# Patient Record
Sex: Male | Born: 1970
Health system: Southern US, Community
[De-identification: ages and names within clinical notes are randomized; demographics above are authoritative.]

## PROBLEM LIST (undated history)

## (undated) DIAGNOSIS — M5416 Radiculopathy, lumbar region: Secondary | ICD-10-CM

## (undated) DIAGNOSIS — M545 Low back pain: Secondary | ICD-10-CM

## (undated) DIAGNOSIS — E1142 Type 2 diabetes mellitus with diabetic polyneuropathy: Secondary | ICD-10-CM

## (undated) DIAGNOSIS — I10 Essential (primary) hypertension: Secondary | ICD-10-CM

## (undated) DIAGNOSIS — G8929 Other chronic pain: Secondary | ICD-10-CM

## (undated) DIAGNOSIS — F32A Depression, unspecified: Secondary | ICD-10-CM

## (undated) DIAGNOSIS — E119 Type 2 diabetes mellitus without complications: Secondary | ICD-10-CM

## (undated) DIAGNOSIS — F1010 Alcohol abuse, uncomplicated: Secondary | ICD-10-CM

## (undated) DIAGNOSIS — K259 Gastric ulcer, unspecified as acute or chronic, without hemorrhage or perforation: Secondary | ICD-10-CM

## (undated) DIAGNOSIS — G629 Polyneuropathy, unspecified: Secondary | ICD-10-CM

## (undated) DIAGNOSIS — H47019 Ischemic optic neuropathy, unspecified eye: Secondary | ICD-10-CM

## (undated) DIAGNOSIS — F419 Anxiety disorder, unspecified: Secondary | ICD-10-CM

## (undated) DIAGNOSIS — G4730 Sleep apnea, unspecified: Secondary | ICD-10-CM

## (undated) HISTORY — DX: Radiculopathy, lumbar region: M54.16

## (undated) HISTORY — DX: Alcohol abuse, uncomplicated: F10.10

## (undated) HISTORY — DX: Type 2 diabetes mellitus with diabetic polyneuropathy: E11.42

## (undated) HISTORY — DX: Other chronic pain: G89.29

## (undated) HISTORY — DX: Polyneuropathy, unspecified: G62.9

## (undated) HISTORY — PX: CARDIAC CATHETERIZATION: SHX172

## (undated) HISTORY — DX: Sleep apnea, unspecified: G47.30

## (undated) HISTORY — DX: Low back pain: M54.5

## (undated) HISTORY — DX: Ischemic optic neuropathy, unspecified eye: H47.019

## (undated) HISTORY — DX: Type 2 diabetes mellitus without complications: E11.9

## (undated) HISTORY — PX: TOTAL KNEE ARTHROPLASTY: SHX125

## (undated) HISTORY — DX: Essential (primary) hypertension: I10

## (undated) HISTORY — DX: Gastric ulcer, unspecified as acute or chronic, without hemorrhage or perforation: K25.9

---

## 1996-11-26 HISTORY — PX: KNEE ARTHROSCOPY W/ ACL RECONSTRUCTION: SHX1858

## 1998-05-23 ENCOUNTER — Emergency Department (HOSPITAL_COMMUNITY): Admission: EM | Admit: 1998-05-23 | Discharge: 1998-05-23 | Payer: Self-pay | Admitting: Emergency Medicine

## 1999-03-19 ENCOUNTER — Emergency Department (HOSPITAL_COMMUNITY): Admission: EM | Admit: 1999-03-19 | Discharge: 1999-03-19 | Payer: Self-pay

## 1999-11-27 HISTORY — PX: MENISCUS REPAIR: SHX5179

## 2000-10-12 ENCOUNTER — Encounter: Payer: Self-pay | Admitting: Emergency Medicine

## 2000-10-12 ENCOUNTER — Emergency Department (HOSPITAL_COMMUNITY): Admission: EM | Admit: 2000-10-12 | Discharge: 2000-10-12 | Payer: Self-pay | Admitting: Emergency Medicine

## 2003-04-12 ENCOUNTER — Encounter (INDEPENDENT_AMBULATORY_CARE_PROVIDER_SITE_OTHER): Payer: Self-pay | Admitting: *Deleted

## 2003-04-12 ENCOUNTER — Ambulatory Visit (HOSPITAL_BASED_OUTPATIENT_CLINIC_OR_DEPARTMENT_OTHER): Admission: RE | Admit: 2003-04-12 | Discharge: 2003-04-12 | Payer: Self-pay | Admitting: *Deleted

## 2006-03-26 HISTORY — PX: OTHER SURGICAL HISTORY: SHX169

## 2007-08-18 ENCOUNTER — Ambulatory Visit: Payer: Self-pay | Admitting: Gastroenterology

## 2007-09-02 ENCOUNTER — Ambulatory Visit (HOSPITAL_COMMUNITY): Admission: RE | Admit: 2007-09-02 | Discharge: 2007-09-02 | Payer: Self-pay | Admitting: Gastroenterology

## 2007-09-16 ENCOUNTER — Ambulatory Visit: Payer: Self-pay | Admitting: Gastroenterology

## 2007-09-16 ENCOUNTER — Encounter: Payer: Self-pay | Admitting: Gastroenterology

## 2007-09-16 DIAGNOSIS — D130 Benign neoplasm of esophagus: Secondary | ICD-10-CM

## 2007-09-16 DIAGNOSIS — K2970 Gastritis, unspecified, without bleeding: Secondary | ICD-10-CM

## 2007-10-27 HISTORY — PX: UPPER GI ENDOSCOPY: SHX6162

## 2008-02-09 DIAGNOSIS — F411 Generalized anxiety disorder: Secondary | ICD-10-CM | POA: Insufficient documentation

## 2008-02-09 DIAGNOSIS — IMO0001 Reserved for inherently not codable concepts without codable children: Secondary | ICD-10-CM | POA: Insufficient documentation

## 2008-02-09 DIAGNOSIS — E1165 Type 2 diabetes mellitus with hyperglycemia: Secondary | ICD-10-CM

## 2008-02-09 DIAGNOSIS — Z794 Long term (current) use of insulin: Secondary | ICD-10-CM

## 2008-02-09 DIAGNOSIS — Z8639 Personal history of other endocrine, nutritional and metabolic disease: Secondary | ICD-10-CM

## 2008-02-09 DIAGNOSIS — Z862 Personal history of diseases of the blood and blood-forming organs and certain disorders involving the immune mechanism: Secondary | ICD-10-CM

## 2008-02-09 DIAGNOSIS — I10 Essential (primary) hypertension: Secondary | ICD-10-CM

## 2008-02-09 DIAGNOSIS — F1021 Alcohol dependence, in remission: Secondary | ICD-10-CM | POA: Insufficient documentation

## 2008-09-06 ENCOUNTER — Emergency Department (HOSPITAL_COMMUNITY): Admission: EM | Admit: 2008-09-06 | Discharge: 2008-09-06 | Payer: Self-pay | Admitting: Emergency Medicine

## 2008-10-26 HISTORY — PX: KNEE ARTHROSCOPY: SUR90

## 2010-03-08 ENCOUNTER — Encounter: Admission: RE | Admit: 2010-03-08 | Discharge: 2010-03-08 | Payer: Self-pay | Admitting: Orthopedic Surgery

## 2010-12-17 ENCOUNTER — Encounter: Payer: Self-pay | Admitting: Gastroenterology

## 2011-02-19 ENCOUNTER — Other Ambulatory Visit: Payer: Self-pay | Admitting: Surgery

## 2011-04-10 NOTE — Letter (Signed)
August 18, 2007    Grant [name]   RE:  Grant [name], Grant [name]  MRN:  [id number]  /  DOB:  24-May-1971   Dear Grant [name]:   It is my pleasure to have treated you recently as a new patient in my  office.  I appreciate your confidence and the opportunity to participate  in your care.   Since I do have a busy inpatient endoscopy schedule and office schedule,  my office hours vary weekly.  I am, however, available for emergency  calls every day through my office.  If I cannot promptly meet an urgent  office appointment, another one of our gastroenterologists will be able  to assist you.   My well-trained staff are prepared to help you at all times.  For  emergencies after office hours, a physician from our gastroenterology  section is always available through my 24-hour answering service.   While you are under my care, I encourage discussion of your questions  and concerns, and I will be happy to return your calls as soon as I am  available.   Once again, I welcome you as a new patient and I look forward to a happy  and healthy relationship.    Sincerely,      Barbette Hair. Arlyce Dice, MD,FACG  Electronically Signed   RDK/MedQ  DD: 08/18/2007  DT: 08/19/2007  Job #: [id number]

## 2011-04-10 NOTE — Letter (Signed)
August 18, 2007    Barry Dienes. Eloise Harman, M.D.  [address]  [place], [place] [address]   RE:  [name], [name]  MRN:  [id number]  /  DOB:  08/31/1971   Dear Dr. Eloise Harman:   Upon your kind referral, I had the pleasure of evaluating your patient  and I am pleased to offer my findings.  I saw Grant [name] in the office  today.  Enclosed is a copy of my progress note that details my findings  and recommendations.   Thank you for the opportunity to participate in your patient's care.    Sincerely,      Barbette Hair. Arlyce Dice, MD,FACG  Electronically Signed    RDK/MedQ  DD: 08/18/2007  DT: 08/19/2007  Job #: [id number]

## 2011-04-10 NOTE — Assessment & Plan Note (Signed)
[facility]                         GASTROENTEROLOGY OFFICE NOTE   NAME:Grant [name], Grant [name]                       MRN:          [id number]  DATE:08/18/2007                            DOB:          [birth year]    REASON FOR CONSULTATION:  1. Anorexia.  2. Weight loss.   REASON:  Grant [name] is a 40 year old white male referred through the  courtesy of Dr. Eloise Harman for evaluation. He complains of immediate post  prandial fullness characterized by early satiety. He feels as if food  will stop in his lower chest. Symptoms are clearly worse post  prandially. On an empty stomach he has aching upper abdominal  discomfort. He denies pyrosis, per se. With the full feeling he  frequently has panic attacks. He has got to spit out food. He also has 3  to 4 loose bowel movements a day. He has lost 35 pounds in the past 2  months. He has a history of anxiety. He was intolerant of Effexor. Lab  work was pertinent for normal amylase and lipase. Liver test demonstrate  an AST of 103, an ALT of 99, alkaline phosphatase, and bilirubin were  normal. Grant [name] has a history of intermittent heavy alcohol use. He  still drinks on occasion. He has taken Nexium in the past without any  improvement in his symptoms.   PAST MEDICAL HISTORY:  Pertinent for hypertension and diabetes. He has a  panic disorder.   FAMILY HISTORY:  Noncontributory.   MEDICATIONS:  Include; labetalol, aspirin, Januvia, Colace, Benicar, and  Neurontin.   HE IS ALLERGIC TO TYLOX.   He does not smoke. 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   REVIEW OF SYSTEMS:  Reviewed and was negative.   PHYSICAL EXAMINATION:  He is an anxious appearing male, pulse 76, blood  pressure 180/110, weight 251.  HEENT: EOMI.  PERRLA.  Sclerae are anicteric.  Conjunctivae are pink.  NECK:  Supple without thyromegaly, adenopathy or carotid bruits.  CHEST:  Clear to auscultation and percussion without adventitious  sounds.  CARDIAC:  Regular rhythm; normal S1 S2.  There are no murmurs, gallops  or rubs.  ABDOMEN:  Bowel sounds are normoactive.  Abdomen is soft, nontender and  nondistended.  There are no abdominal masses, tenderness, splenic  enlargement or hepatomegaly.  EXTREMITIES:  Full range of motion.  No cyanosis, clubbing or edema.  RECTAL:  Deferred. There is no succussion  splash.   IMPRESSION:  1. Early satiety with possible dysphagia. Symptoms could be due to a      motility disorder, anxiety, distal esophageal stricture, or      gastroparesis.  2. Abnormal liver test. I suspect he has hepatic steatosis.  3. Severe anxiety.  4. Diabetes.  5. Hypertension.   RECOMMENDATION:  1. Trial of Lexapro 10 mg a day.  2. Abdominal ultrasound.  3. Upper endoscopy with dilation as indicated.     Grant [name]. Arlyce Dice, MD,FACG  Electronically Signed    RDK/MedQ  DD: 08/18/2007  DT: 08/19/2007  Job #: [id number]   cc:   Barry Dienes. Eloise Harman, M.D.

## 2011-04-13 NOTE — Op Note (Signed)
   NAME:  Grant [name], Grant [name]                          ACCOUNT NO.:  [id number]   MEDICAL RECORD NO.:  [id number]                   PATIENT TYPE:  AMB   LOCATION:  DSC                                  FACILITY:  [facility]   PHYSICIAN:  Maisie Fus B. Samuella Cota, M.D.               DATE OF BIRTH:  12/15/1970   DATE OF PROCEDURE:  04/12/2003  DATE OF DISCHARGE:                                 OPERATIVE REPORT   [facility] #[id number].   PREOPERATIVE DIAGNOSIS:  Lipoma, left upper back.   POSTOPERATIVE DIAGNOSIS:  Lipoma, left upper back.   PROCEDURE:  Excision of lipoma, left upper back.   DESCRIPTION OF PROCEDURE:  The patient was taken to the minor surgery room  at [facility] day surgery.  The patient had a subcutaneous mass consistent with a  lipoma of the left upper back.  This was 3 x 3 cm.  The area was marked with  a marking pen and the area prepped with Betadine and sterile drapes applied.  Xylocaine 1% local was used to anesthetize the skin over the mass.  The  incision was made, which was 3 cm long.  Dissection was taken down to the  fascia covering the lipoma.  This was incised and the 3 cm in diameter  lipoma was shelled out.  The fascia was then closed with interrupted sutures  of 3-0 Vicryl, the skin was closed with five vertical mattress sutures of 4-  0 nylon.  A dry sterile dressing was applied.  The patient seemed to  tolerate the procedure well, was taken to the waiting room and discharged in  satisfactory condition.                                               Thomas B. Samuella Cota, M.D.    TBP/MEDQ  D:  04/12/2003  T:  04/12/2003  Job:  [id number]   cc:   Barry Dienes. Eloise Harman, M.D.  [address]  [place]  [place] [address]  Fax: [phone]

## 2011-08-28 LAB — DIFFERENTIAL
Lymphocytes Relative: 19
Lymphs Abs: 1.6
Monocytes Absolute: 0.5
Monocytes Relative: 6
Neutro Abs: 6
Neutrophils Relative %: 73

## 2011-08-28 LAB — COMPREHENSIVE METABOLIC PANEL
Albumin: 4.3
BUN: 7
Calcium: 9.3
Glucose, Bld: 200 — ABNORMAL HIGH
Potassium: 4
Total Protein: 7

## 2011-08-28 LAB — RAPID URINE DRUG SCREEN, HOSP PERFORMED
Benzodiazepines: POSITIVE — AB
Tetrahydrocannabinol: NOT DETECTED

## 2011-08-28 LAB — CBC
HCT: 54.2 — ABNORMAL HIGH
Hemoglobin: 18.5 — ABNORMAL HIGH
MCHC: 34.1
Platelets: 132 — ABNORMAL LOW
RDW: 13.7

## 2011-08-28 LAB — URINALYSIS, ROUTINE W REFLEX MICROSCOPIC
Glucose, UA: >1000 — AB
Hgb urine dipstick: NEGATIVE
Specific Gravity, Urine: 1.035 — ABNORMAL HIGH
Urobilinogen, UA: 1
pH: 6

## 2011-08-28 LAB — URINE MICROSCOPIC-ADD ON

## 2011-08-28 LAB — ETHANOL: Alcohol, Ethyl (B): <5

## 2013-08-05 ENCOUNTER — Encounter: Payer: Self-pay | Admitting: Internal Medicine

## 2013-08-05 ENCOUNTER — Encounter (HOSPITAL_COMMUNITY): Payer: Self-pay | Admitting: Pharmacy Technician

## 2013-08-05 ENCOUNTER — Ambulatory Visit (INDEPENDENT_AMBULATORY_CARE_PROVIDER_SITE_OTHER): Payer: BC Managed Care – PPO | Admitting: Internal Medicine

## 2013-08-05 ENCOUNTER — Other Ambulatory Visit: Payer: Self-pay | Admitting: *Deleted

## 2013-08-05 VITALS — BP 140/110 | HR 104 | Ht 71.0 in | Wt 222.9 lb

## 2013-08-05 DIAGNOSIS — I10 Essential (primary) hypertension: Secondary | ICD-10-CM

## 2013-08-05 DIAGNOSIS — R5381 Other malaise: Secondary | ICD-10-CM

## 2013-08-05 DIAGNOSIS — R000 Tachycardia, unspecified: Secondary | ICD-10-CM

## 2013-08-05 DIAGNOSIS — D689 Coagulation defect, unspecified: Secondary | ICD-10-CM

## 2013-08-05 DIAGNOSIS — G629 Polyneuropathy, unspecified: Secondary | ICD-10-CM

## 2013-08-05 DIAGNOSIS — R079 Chest pain, unspecified: Secondary | ICD-10-CM

## 2013-08-05 DIAGNOSIS — G609 Hereditary and idiopathic neuropathy, unspecified: Secondary | ICD-10-CM

## 2013-08-05 DIAGNOSIS — R0609 Other forms of dyspnea: Secondary | ICD-10-CM

## 2013-08-05 DIAGNOSIS — R5383 Other fatigue: Secondary | ICD-10-CM

## 2013-08-05 DIAGNOSIS — E1065 Type 1 diabetes mellitus with hyperglycemia: Secondary | ICD-10-CM

## 2013-08-05 DIAGNOSIS — Z862 Personal history of diseases of the blood and blood-forming organs and certain disorders involving the immune mechanism: Secondary | ICD-10-CM

## 2013-08-05 DIAGNOSIS — Z01818 Encounter for other preprocedural examination: Secondary | ICD-10-CM

## 2013-08-05 MED ORDER — NITROGLYCERIN 0.4 MG SL SUBL: 0.4000 mg | SUBLINGUAL_TABLET | SUBLINGUAL | Status: DC | PRN

## 2013-08-05 NOTE — Progress Notes (Signed)
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 He recently saw Dr. Jarold Motto with complaints of increasing shortness of breath with minimal exertion and fatigue as well as chest heaviness which radiates around the left side. The symptoms are somewhat worse with exertion and relieved at rest. He notes the increasing fatigue and shortness of breath over the past couple of weeks this has also been associated with marked hypertension, both systolic and diastolic. He was recently placed on Benicar in addition to an increase in his labetalol to 600 mg twice daily. Despite that he continues to be hypertensive with a blood pressure of 140/110 today and tachycardic at 104.  In addition to hypertension he also has insulin-dependent diabetes which has been poorly controlled, dyslipidemia not on medication, family history of hypertension and diabetes, and continues to smoke. He has also struggled with alcohol dependence, and recently quit cold [place] about 2 months ago, but previously had significant alcohol abuse and dependence issues.  He apparently had recent blood work which showed a very high hematocrit, and was told to donate blood? He is referred for evaluation of coronary artery disease.  PMHx:  Past Medical History  Diagnosis Date  . Type 2 diabetes mellitus   . Neuropathy     Past Surgical History  Procedure Laterality Date  . Back lipoma resection  03/2006  . Knee arthroscopy  10/2008    left  . Upper gi endoscopy  10/2007    showed gastritis    FAMHx:  Family History  Problem Relation Age of Onset  . Diabetes Mother   . Hypertension Mother   . Hypertension Father   . Hyperlipidemia Father   . Brain cancer      grandparent  . Stomach cancer      grandparent     SOCHx:   reports that he has been smoking.  His smokeless tobacco use includes Chew. He reports that he does not drink alcohol or use illicit drugs.  ALLERGIES:  Allergies  Allergen Reactions  . Tylox [Oxycodone-Acetaminophen] Nausea And Vomiting    ROS: A comprehensive review of systems was negative except for: Constitutional: positive for fatigue Respiratory: positive for dyspnea on exertion Cardiovascular: positive for exertional chest pressure/discomfort  HOME MEDS: Current Outpatient Prescriptions  Medication Sig Dispense Refill  . aspirin 325 MG EC tablet Take 325 mg by mouth daily.      Marland Kitchen glimepiride (AMARYL) 2 MG tablet Take 2 mg by mouth daily before breakfast.      . insulin glargine (LANTUS) 100 UNIT/ML injection Inject 20 Units into the skin at bedtime.      Marland Kitchen labetalol (NORMODYNE) 200 MG tablet Take 600 mg by mouth 2 (two) times daily.      Marland Kitchen olmesartan (BENICAR) 20 MG tablet Take 20 mg by mouth daily.      . traZODone (DESYREL) 100 MG tablet Take 100 mg by mouth at bedtime.      . nitroGLYCERIN (NITROSTAT) 0.4 MG SL tablet Place 1 tablet (0.4 mg total) under the tongue every 5 (five) minutes as needed for chest pain.  25 tablet  3   No current facility-administered medications for this visit.    LABS/IMAGING: No results found for this or any previous visit (from the past 48 hour(s)). No  results found.  VITALS: BP 140/110  Pulse 104  Ht 5\' 11"  (1.803 m)  Wt 222 lb 14.4 oz (101.107 kg)  BMI 31.1 kg/m2  EXAM: General appearance: alert and no distress Neck: no adenopathy, no carotid bruit, no JVD, supple, symmetrical, trachea midline and thyroid not enlarged, symmetric, no tenderness/mass/nodules Lungs: clear to auscultation bilaterally Heart: Tachycardic, regular rhythm Abdomen: soft, non-tender; bowel sounds normal; no masses,  no organomegaly Extremities: extremities normal, atraumatic, no cyanosis or edema Pulses: 2+ and symmetric Skin: Pale,  diaphoretic Neurologic: Grossly normal  EKG: Sinus tachycardia at 104, no rest ischemic changes  ASSESSMENT: 1. Progressive chest discomfort, fatigue and dyspnea on exertion, highly concerning for coronary disease 2. Systolic and diastolic hypertension-uncontrolled 3. Insulin-dependent diabetes-uncontrolled 4. Alcohol dependence, currently sober 5. Tobacco dependence 6. Dyslipidemia  PLAN: 1.   Mellody Dance has been increasingly symptomatic over the past 2 weeks with progressive shortness of breath on exertion as well as left sided chest pressure. His blood pressure has been markedly elevated despite increases in his blood pressure medicine is still not at goal. He has both systolic and diastolic hypertension, and his heart rate is still elevated in the face of high-dose labetalol. The best explanation for this would be significant coronary ischemia. Diastolic hypertension and tachycardia are clear signs that the heart is not getting good blood flow. It would be unlikely for this to be related to alcohol withdrawal almost 2 months after stopping his routine alcohol use. She has numerous, uncontrolled coronary risk factors and a pretty good story for angina, actually concerning for high-grade LAD, proximal left main or multivessel disease. Based on the high pretest probability for ischemic coronary disease, I have recommended going directly to cardiac catheterization. I discussed risks and benefits of the procedure with him today and we'll hopefully be able to perform it through a radial approach. I encouraged him to continue to take aspirin daily and will provide a note limiting the strenuous status of his activities. He was reminded that if he had recurrent chest pain or shortness of breath that did not improve before the catheterization that he should present to the emergency room. I also prescribed him some sublingual nitroglycerin and instructed him how to use it and who to call if he should have a need  for it.  Hopefully we'll be able to perform his catheterization next week.  Thank you for referring Mr. Kincheloe. If I can be of further assistance for him or other patient please don't hesitate to contact me.  Chrystie Nose, MD, [facility] Attending Cardiologist [facility]  Mushka Laconte C 08/05/2013, 10:58 AM

## 2013-08-05 NOTE — Patient Instructions (Addendum)
Dr. Rennis Golden has ordered nitroglycerin to be used as needed for chest pain. You can take 1 pill (place under your tongue) every 5 minutes for a maximum of 3 doses.   Dr. Rennis Golden has ordered a heart catheterization. Please schedule for next week with Dr. Georgeann Oppenheim will need lab work done prior to this procedure, as well as a chest x-ray. The best location to go to for this pre-procedure tests/blood work is [address]) near [facility]. Both the chest x-ray and blood work are walk-in - no appointment is needed.

## 2013-08-06 ENCOUNTER — Ambulatory Visit
Admission: RE | Admit: 2013-08-06 | Discharge: 2013-08-06 | Disposition: A | Discharge status: Home / Self Care | Payer: BC Managed Care – PPO | Source: Ambulatory Visit | Attending: Internal Medicine | Admitting: Internal Medicine

## 2013-08-06 LAB — BASIC METABOLIC PANEL
CO2: 27 mEq/L (ref 19–32)
Chloride: 98 mEq/L (ref 96–112)
Creat: 0.76 mg/dL (ref 0.50–1.35)
Potassium: 4.4 mEq/L (ref 3.5–5.3)
Sodium: 136 mEq/L (ref 135–145)

## 2013-08-06 LAB — LIPID PANEL
Cholesterol: 209 mg/dL — ABNORMAL HIGH (ref 0–200)
HDL: 39 mg/dL — ABNORMAL LOW (ref >39)
Triglycerides: 347 mg/dL — ABNORMAL HIGH (ref <150)

## 2013-08-06 LAB — CBC
MCV: 88.7 fL (ref 78.0–100.0)
Platelets: 207 10*3/uL (ref 150–400)
RBC: 5.41 MIL/uL (ref 4.22–5.81)
WBC: 8.7 10*3/uL (ref 4.0–10.5)

## 2013-08-06 LAB — TSH: TSH: 2.369 u[IU]/mL (ref 0.350–4.500)

## 2013-08-06 LAB — APTT: aPTT: 37 seconds (ref 24–37)

## 2013-08-11 ENCOUNTER — Ambulatory Visit (HOSPITAL_COMMUNITY)
Admission: RE | Admit: 2013-08-11 | Discharge: 2013-08-11 | Disposition: A | Discharge status: Home / Self Care | Payer: BC Managed Care – PPO | Source: Ambulatory Visit | Attending: Internal Medicine | Admitting: Internal Medicine

## 2013-08-11 ENCOUNTER — Encounter (HOSPITAL_COMMUNITY)
Admission: RE | Disposition: A | Discharge status: Home / Self Care | Payer: Self-pay | Source: Ambulatory Visit | Attending: Internal Medicine

## 2013-08-11 DIAGNOSIS — Z01818 Encounter for other preprocedural examination: Secondary | ICD-10-CM

## 2013-08-11 DIAGNOSIS — R0609 Other forms of dyspnea: Secondary | ICD-10-CM

## 2013-08-11 DIAGNOSIS — I209 Angina pectoris, unspecified: Secondary | ICD-10-CM | POA: Insufficient documentation

## 2013-08-11 DIAGNOSIS — R000 Tachycardia, unspecified: Secondary | ICD-10-CM

## 2013-08-11 DIAGNOSIS — E119 Type 2 diabetes mellitus without complications: Secondary | ICD-10-CM | POA: Insufficient documentation

## 2013-08-11 DIAGNOSIS — F17200 Nicotine dependence, unspecified, uncomplicated: Secondary | ICD-10-CM | POA: Insufficient documentation

## 2013-08-11 DIAGNOSIS — Z794 Long term (current) use of insulin: Secondary | ICD-10-CM | POA: Insufficient documentation

## 2013-08-11 DIAGNOSIS — R5383 Other fatigue: Secondary | ICD-10-CM

## 2013-08-11 DIAGNOSIS — I10 Essential (primary) hypertension: Secondary | ICD-10-CM

## 2013-08-11 DIAGNOSIS — R079 Chest pain, unspecified: Secondary | ICD-10-CM

## 2013-08-11 DIAGNOSIS — IMO0001 Reserved for inherently not codable concepts without codable children: Secondary | ICD-10-CM

## 2013-08-11 DIAGNOSIS — I2510 Atherosclerotic heart disease of native coronary artery without angina pectoris: Secondary | ICD-10-CM

## 2013-08-11 DIAGNOSIS — E785 Hyperlipidemia, unspecified: Secondary | ICD-10-CM | POA: Insufficient documentation

## 2013-08-11 HISTORY — PX: LEFT HEART CATHETERIZATION WITH CORONARY ANGIOGRAM: SHX5451

## 2013-08-11 LAB — GLUCOSE, CAPILLARY

## 2013-08-11 SURGERY — LEFT HEART CATHETERIZATION WITH CORONARY ANGIOGRAM
Anesthesia: LOCAL

## 2013-08-11 MED ORDER — ATORVASTATIN CALCIUM 20 MG PO TABS: 20.0000 mg | ORAL_TABLET | Freq: Every day | ORAL | Status: DC

## 2013-08-11 MED ORDER — FENTANYL CITRATE 0.05 MG/ML IJ SOLN
INTRAMUSCULAR | Status: AC
  Filled 2013-08-11: qty 2

## 2013-08-11 MED ORDER — SODIUM CHLORIDE 0.9 % IV SOLN: INTRAVENOUS | Status: DC

## 2013-08-11 MED ORDER — SODIUM CHLORIDE 0.9 % IV SOLN
INTRAVENOUS | Status: DC
  Administered 2013-08-11: 10:00:00 via INTRAVENOUS

## 2013-08-11 MED ORDER — ACETAMINOPHEN 325 MG PO TABS: 650.0000 mg | ORAL_TABLET | ORAL | Status: DC | PRN

## 2013-08-11 MED ORDER — ASPIRIN 81 MG PO CHEW
324.0000 mg | CHEWABLE_TABLET | ORAL | Status: AC
  Administered 2013-08-11: 324 mg via ORAL
  Filled 2013-08-11: qty 4

## 2013-08-11 MED ORDER — ONDANSETRON HCL 4 MG/2ML IJ SOLN: 4.0000 mg | Freq: Four times a day (QID) | INTRAMUSCULAR | Status: DC | PRN

## 2013-08-11 MED ORDER — ASPIRIN EC 81 MG PO TBEC: 81.0000 mg | DELAYED_RELEASE_TABLET | Freq: Every day | ORAL | Status: DC

## 2013-08-11 MED ORDER — NITROGLYCERIN 0.2 MG/ML ON CALL CATH LAB
INTRAVENOUS | Status: AC
  Filled 2013-08-11: qty 1

## 2013-08-11 MED ORDER — ISOSORBIDE MONONITRATE ER 30 MG PO TB24: 15.0000 mg | ORAL_TABLET | Freq: Every day | ORAL | Status: DC

## 2013-08-11 MED ORDER — LIDOCAINE HCL (PF) 1 % IJ SOLN
INTRAMUSCULAR | Status: AC
  Filled 2013-08-11: qty 30

## 2013-08-11 MED ORDER — AMLODIPINE BESYLATE 5 MG PO TABS: 5.0000 mg | ORAL_TABLET | Freq: Every day | ORAL | Status: DC

## 2013-08-11 MED ORDER — SODIUM CHLORIDE 0.9 % IJ SOLN: 3.0000 mL | INTRAMUSCULAR | Status: DC | PRN

## 2013-08-11 MED ORDER — MIDAZOLAM HCL 5 MG/5ML IJ SOLN
INTRAMUSCULAR | Status: AC
  Filled 2013-08-11: qty 5

## 2013-08-11 MED ORDER — HEPARIN (PORCINE) IN NACL 2-0.9 UNIT/ML-% IJ SOLN
INTRAMUSCULAR | Status: AC
  Filled 2013-08-11: qty 1000

## 2013-08-11 NOTE — H&P (Signed)
    INTERVAL PROCEDURE H&P  History and Physical Interval Note:  08/11/2013 8:38 AM  Grant [name] has presented today for their planned procedure. The various methods of treatment have been discussed with the patient and family. After consideration of risks, benefits and other options for treatment, the patient has consented to the procedure.  The patients' outpatient history has been reviewed, patient examined, and no change in status from most recent office note within the past 30 days. I have reviewed the patients' chart and labs and will proceed as planned. Questions were answered to the patient's satisfaction.   Chrystie Nose, MD, [facility] Attending Cardiologist [facility]  [name],[name] 08/11/2013, 8:38 AM

## 2013-08-11 NOTE — CV Procedure (Signed)
CARDIAC CATHETERIZATION REPORT  Grant [name]   [id number] 1971/04/23  Performing Cardiologist: Chrystie Nose Primary Physician: Garlan Fillers, MD Primary Cardiologist:  Dr. Rennis Golden  Procedures Performed:  Left Heart Catheterization via 5 Fr right femoral artery access  Left Ventriculography, (RAO/LAO) 30 ml/sec for 15 ml total contrast  Native Coronary Angiography  Indication(s): DOE, unstable angina  Pre-Procedural Diagnosis(es):  1. Unstable angina  Post-Procedural Diagnosis(es): 1. 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 He recently saw Dr. Jarold Motto with complaints of increasing shortness of breath with minimal exertion and fatigue as well as chest heaviness which radiates around the left side. The symptoms are somewhat worse with exertion and relieved at rest. He notes the increasing fatigue and shortness of breath over the past couple of weeks this has also been associated with marked hypertension, both systolic and diastolic. He was recently placed on Benicar in addition to an increase in his labetalol to 600 mg twice daily. Despite that he continues to be hypertensive with a blood pressure of 140/110 today and tachycardic at 104. In addition to hypertension he also has insulin-dependent diabetes which has been poorly controlled, dyslipidemia not on medication, family history of hypertension and diabetes, and continues to smoke. He has also struggled with alcohol dependence, and recently quit cold [place] about 2 months ago, but previously had significant alcohol abuse and dependence issues. He apparently had recent blood work which showed a very high hematocrit, and was told to donate blood? He is referred for left heart catheterization.    Risks / Complications include, but not limited to: Death, MI, CVA/TIA, VF/VT (with defibrillation), Bradycardia (need for  temporary pacer placement), contrast induced nephropathy, bleeding / bruising / hematoma / pseudoaneurysm, vascular or coronary injury (with possible emergent CT or Vascular Surgery), adverse medication reactions, infection.    Consent: Risks of procedure as well as the alternatives and risks of each were explained to the (patient/caregiver).  Consent for procedure obtained.  Procedure: The patient was brought to the 2nd Floor [facility] in the fasting state and prepped and draped in the usual sterile fashion for (Right groin) access. A modified Allen's test with plethysmography was performed on the right wrist demonstrating inadequate Ulnar Artery collateral flow.  The right radial pulse was barely palpable.  Time Out: Verified patient identification, verified procedure, site/side was marked, verified correct patient position, special equipment/implants available, radiation safety measures in place (including badges and shielding), medications/allergies/relevent history reviewed, required imaging and test results available.  Performed  Procedure: The right femoral head was identified using tactile and fluoroscopic technique.  The right groin was anesthetized with 1% subcutaneous Lidocaine.  The right Common Femoral Artery was accessed using the Modified Seldinger Technique with placement of (5 Fr) sheath using the Seldinger technique.  The sheath was aspirated and flushed.  A 5 Fr JL4 Catheter was advanced of over a Standard J wire into the ascending Aorta.  The catheter was used to engage the left coronary artery, however, it sub-selected the LCX. Ultimately a 45F EBU guide was used to visualize the LAD.  Multiple cineangiographic views of the left coronary artery system(s) were performed. A 5 Fr JR4 Catheter was advanced of over a Safety J wire into the ascending Aorta.  The catheter was used to engage the right coronary artery.  Multiple cineangiographic views of the right  coronary artery system(s) were  performed. This catheter was then exchanged over the Standard J wire for an angled Pigtail catheter that was advanced across the Aortic Valve.  LV hemodynamics were measured (and Left Ventriculography was performed).  LV hemodynamics were then re-sampled, and the catheter was pulled back across the Aortic Valve for measurement of "pull-back" gradient.  The catheter and the wire was removed completely out of the body. The patient was transferred to the holding area where the sheath was removed with manual pressure held for hemostasis.   Recovery: The patient was transported to the cath lab holding area in stable condition.   The patient  was stable before, during and following the procedure.   Patient did tolerate procedure well. There were not complications.  EBL: Minimal  Medications:  Premedication: none  Sedation:  4 mg IV Versed, 100 mcg IV Fentanyl  Contrast:  75 ml Omnipaque  Local Anesthesia: 3 cc 1% lidocaine  100 mcg of IC nitroglycerin  Hemodynamics:  Central Aortic Pressure / Mean Aortic Pressure: 107/67  LV Pressure / LV End diastolic Pressure:  20  Left Ventriculography:  EF:  60-65%  Wall Motion: Normal  MR: none  Coronary Angiographic Data:  Left Main:  Short vessel, no stenosis. Essentially bifurcates almost immediately into the LAD and LCx systems.  Left Anterior Descending (LAD):  Long vessel which wraps around the apex. There was moderate to severe diffuse proximal stenosis initially which completely resolved with 100 mcg of IC nitroglycerin. There is a large septal perforator which is patent. The LAD gives rise to a diagonal branch in the mid-vessel.  1st diagonal (D1):  Angiographically free of disease.    Circumflex (LCx):  Dominant vessel. Gives off 3 OM vessels - one large vessel with a high takeoff and 2 moderate distal vessels. There is no stenosis.  1st obtuse marginal:  There was a 60% ostial OM1 stenosis, which was seen  completely resolving after IC nitroglycerin.  2nd obtuse marginal:  Distal, free of stenosis. 3rd obtuse marginal:  Distal, free of stenosis.     Right Coronary Artery: Smaller vessel, non-dominant. No stenosis or spasm was noted.  Impression: 1.  Coronary spasm of the LAD and LCx vessels. 2.  No angiographic coronary stenosis. 3.  LVEDP = 20 mmHg  Plan: 1.  Long discussion with the patient and family. Strongly urged smoking cessation for him and his wife. This will be the most important thing he can do to reduce spasm.  Although      his bp control is better, I wish to adjust his medications some to prevent spasm.  Will d/c benicar and add amlodipine 5 mg daily and imdur 15 mg daily. Continue labetalol. 2.  Marked dyslipidemia in a diabetic patient, with mostly elevated triglycerides. Recommend more aggressive diet and glycemic control. Will add lipitor 20 mg QHS. 3.  Reduce aspirin to prophylactic 81 mg daily (indication for diabetic). 4.  Plan to keep out of work until he is medically cleared after seeing me in 1 week for follow-up.  The case and results was discussed with the patient and family if available.  The case and results was not discussed with the patient's PCP. The case and results was discussed with the patient's Cardiologist.  Time Spent Directly with the Patient:  60 minutes  Chrystie Nose, MD, [facility] Attending Cardiologist [facility]  Everlene Cunning C 08/11/2013, 12:27 PM

## 2013-08-17 ENCOUNTER — Encounter: Payer: Self-pay | Admitting: Internal Medicine

## 2013-08-17 ENCOUNTER — Ambulatory Visit (INDEPENDENT_AMBULATORY_CARE_PROVIDER_SITE_OTHER): Payer: BC Managed Care – PPO | Admitting: Internal Medicine

## 2013-08-17 ENCOUNTER — Telehealth: Payer: Self-pay | Admitting: Hematology and Oncology

## 2013-08-17 VITALS — BP 138/92 | HR 88 | Ht 71.0 in | Wt 223.9 lb

## 2013-08-17 DIAGNOSIS — I201 Angina pectoris with documented spasm: Secondary | ICD-10-CM | POA: Insufficient documentation

## 2013-08-17 DIAGNOSIS — E785 Hyperlipidemia, unspecified: Secondary | ICD-10-CM

## 2013-08-17 DIAGNOSIS — R000 Tachycardia, unspecified: Secondary | ICD-10-CM

## 2013-08-17 DIAGNOSIS — I209 Angina pectoris, unspecified: Secondary | ICD-10-CM

## 2013-08-17 DIAGNOSIS — I10 Essential (primary) hypertension: Secondary | ICD-10-CM

## 2013-08-17 DIAGNOSIS — F17200 Nicotine dependence, unspecified, uncomplicated: Secondary | ICD-10-CM

## 2013-08-17 MED ORDER — NICOTINE POLACRILEX 4 MG MT GUM: 4.0000 mg | CHEWING_GUM | OROMUCOSAL | Status: DC | PRN

## 2013-08-17 NOTE — Telephone Encounter (Signed)
LVOM FOR PT TO RETURN CALL IN RE TO REFERRAL.  °

## 2013-08-17 NOTE — Patient Instructions (Addendum)
Your physician wants you to follow-up in: 6 months. You will receive a reminder letter in the mail two months in advance. If you don't receive a letter, please call our office to schedule the follow-up appointment.   Smoking Cessation Quitting smoking is important to your health and has many advantages. However, it is not always easy to quit since nicotine is a very addictive drug. Often times, people try 3 times or more before being able to quit. This document explains the best ways for you to prepare to quit smoking. Quitting takes hard work and a lot of effort, but you can do it. ADVANTAGES OF QUITTING SMOKING  You will live longer, feel better, and live better.  Your body will feel the impact of quitting smoking almost immediately.  Within 20 minutes, blood pressure decreases. Your pulse returns to its normal level.  After 8 hours, carbon monoxide levels in the blood return to normal. Your oxygen level increases.  After 24 hours, the chance of having a heart attack starts to decrease. Your breath, hair, and body stop smelling like smoke.  After 48 hours, damaged nerve endings begin to recover. Your sense of taste and smell improve.  After 72 hours, the body is virtually free of nicotine. Your bronchial tubes relax and breathing becomes easier.  After 2 to 12 weeks, lungs can hold more air. Exercise becomes easier and circulation improves.  The risk of having a heart attack, stroke, cancer, or lung disease is greatly reduced.  After 1 year, the risk of coronary heart disease is cut in half.  After 5 years, the risk of stroke falls to the same as a nonsmoker.  After 10 years, the risk of lung cancer is cut in half and the risk of other cancers decreases significantly.  After 15 years, the risk of coronary heart disease drops, usually to the level of a nonsmoker.  If you are pregnant, quitting smoking will improve your chances of having a healthy baby.  The people you live with,  especially any children, will be healthier.  You will have extra money to spend on things other than cigarettes. QUESTIONS TO THINK ABOUT BEFORE ATTEMPTING TO QUIT You may want to talk about your answers with your caregiver.  Why do you want to quit?  If you tried to quit in the past, what helped and what did not?  What will be the most difficult situations for you after you quit? How will you plan to handle them?  Who can help you through the tough times? Your family? Friends? A caregiver?  What pleasures do you get from smoking? What ways can you still get pleasure if you quit? Here are some questions to ask your caregiver:  How can you help me to be successful at quitting?  What medicine do you think would be best for me and how should I take it?  What should I do if I need more help?  What is smoking withdrawal like? How can I get information on withdrawal? GET READY  Set a quit date.  Change your environment by getting rid of all cigarettes, ashtrays, matches, and lighters in your home, car, or work. Do not let people smoke in your home.  Review your past attempts to quit. Think about what worked and what did not. GET SUPPORT AND ENCOURAGEMENT You have a better chance of being successful if you have help. You can get support in many ways.  Tell your family, friends, and co-workers that you  are going to quit and need their support. Ask them not to smoke around you.  Get individual, group, or telephone counseling and support. Programs are available at [employer] and health centers. Call your local health department for information about programs in your area.  Spiritual beliefs and practices may help some smokers quit.  Download a "quit meter" on your computer to keep track of quit statistics, such as how long you have gone without smoking, cigarettes not smoked, and money saved.  Get a self-help book about quitting smoking and staying off of tobacco. LEARN NEW  SKILLS AND BEHAVIORS  Distract yourself from urges to smoke. Talk to someone, go for a walk, or occupy your time with a task.  Change your normal routine. Take a different route to work. Drink tea instead of coffee. Eat breakfast in a different place.  Reduce your stress. Take a hot bath, exercise, or read a book.  Plan something enjoyable to do every day. Reward yourself for not smoking.  Explore interactive web-based programs that specialize in helping you quit. GET MEDICINE AND USE IT CORRECTLY Medicines can help you stop smoking and decrease the urge to smoke. Combining medicine with the above behavioral methods and support can greatly increase your chances of successfully quitting smoking.  Nicotine replacement therapy helps deliver nicotine to your body without the negative effects and risks of smoking. Nicotine replacement therapy includes nicotine gum, lozenges, inhalers, nasal sprays, and skin patches. Some may be available over-the-counter and others require a prescription.  Antidepressant medicine helps people abstain from smoking, but how this works is unknown. This medicine is available by prescription.  Nicotinic receptor partial agonist medicine simulates the effect of nicotine in your brain. This medicine is available by prescription. Ask your caregiver for advice about which medicines to use and how to use them based on your health history. Your caregiver will tell you what side effects to look out for if you choose to be on a medicine or therapy. Carefully read the information on the package. Do not use any other product containing nicotine while using a nicotine replacement product.  RELAPSE OR DIFFICULT SITUATIONS Most relapses occur within the first 3 months after quitting. Do not be discouraged if you start smoking again. Remember, most people try several times before finally quitting. You may have symptoms of withdrawal because your body is used to nicotine. You may crave  cigarettes, be irritable, feel very hungry, cough often, get headaches, or have difficulty concentrating. The withdrawal symptoms are only temporary. They are strongest when you first quit, but they will go away within 10 14 days. To reduce the chances of relapse, try to:  Avoid drinking alcohol. Drinking lowers your chances of successfully quitting.  Reduce the amount of caffeine you consume. Once you quit smoking, the amount of caffeine in your body increases and can give you symptoms, such as a rapid heartbeat, sweating, and anxiety.  Avoid smokers because they can make you want to smoke.  Do not let weight gain distract you. Many smokers will gain weight when they quit, usually less than 10 pounds. Eat a healthy diet and stay active. You can always lose the weight gained after you quit.  Find ways to improve your mood other than smoking. FOR MORE INFORMATION  www.smokefree.gov  Document Released: 11/06/2001 Document Revised: 05/13/2012 Document Reviewed: 02/21/2012 [facility] Patient Information 2014 [place], [place].

## 2013-08-17 NOTE — Progress Notes (Signed)
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 He recently saw Dr. Jarold Motto with complaints of increasing shortness of breath with minimal exertion and fatigue as well as chest heaviness which radiates around the left side. The symptoms are somewhat worse with exertion and relieved at rest. He notes the increasing fatigue and shortness of breath over the past couple of weeks this has also been associated with marked hypertension, both systolic and diastolic. He was recently placed on Benicar in addition to an increase in his labetalol to 600 mg twice daily. Despite that he continues to be hypertensive with a blood pressure of 140/110 today and tachycardic at 104.  In addition to hypertension he also has insulin-dependent diabetes which has been poorly controlled, dyslipidemia not on medication, family history of hypertension and diabetes, and continues to smoke. He has also struggled with alcohol dependence, and recently quit cold [place] about 2 months ago, but previously had significant alcohol abuse and dependence issues.  He apparently had recent blood work which showed a very high hematocrit, and was told to donate blood? He is referred for evaluation of coronary artery disease.  Based on his symptoms I was concerned for progressive unstable angina and coronary artery disease. I recommended going directly to cardiac catheterization. He underwent cardiac catheterization on 08/11/2013. This did demonstrate areas of coronary spasm in the OM branch and the proximal LAD which was significant. This completely resolved with nitroglycerin. Otherwise there was no significant obstructive coronary disease. We had a long discussion in the hospital and again today about this factor  modification and spent more than 5 minutes on smoking cessation. He does not want to take any medications for this but is interested in taking down. He says he is going to try to quit with his wife. He does report an improvement in his chest pain symptoms and his fatigue to some extent on his new medical regimen. I recommended discontinuing his ARB and starting amlodipine, long-acting nitrate and low-dose Lipitor. Also reduce his aspirin to 81 mg daily, which is strictly for prevention given the fact that he is a diabetic on insulin.  He says over the past week his fatigue is improved somewhat and is energy level is somewhat better. He has managed to cut his smoking way back to about 5-7 cigarettes a day.  PMHx:  Past Medical History  Diagnosis Date  . Type 2 diabetes mellitus   . Neuropathy     Past Surgical History  Procedure Laterality Date  . Back lipoma resection  03/2006  . Knee arthroscopy  10/2008    left  . Upper gi endoscopy  10/2007    showed gastritis    FAMHx:  Family History  Problem Relation Age of Onset  . Diabetes Mother   . Hypertension Mother   . Hypertension Father   . Hyperlipidemia Father   . Brain cancer      grandparent  . Stomach cancer      grandparent    SOCHx:   reports that he has been smoking.  His smokeless tobacco use includes Chew. He reports that he does not drink alcohol or use illicit drugs.  ALLERGIES:  Allergies  Allergen Reactions  . Tylox [Oxycodone-Acetaminophen] Nausea And Vomiting    ROS: A comprehensive  review of systems was negative except for: Constitutional: positive for fatigue Respiratory: positive for dyspnea on exertion  HOME MEDS: Current Outpatient Prescriptions  Medication Sig Dispense Refill  . amLODipine (NORVASC) 5 MG tablet Take 1 tablet (5 mg total) by mouth daily.  30 tablet  5  . aspirin EC 81 MG tablet Take 1 tablet (81 mg total) by mouth daily.      Marland Kitchen atorvastatin (LIPITOR) 20 MG tablet Take 1 tablet (20 mg  total) by mouth at bedtime.  30 tablet  5  . glimepiride (AMARYL) 2 MG tablet Take 2 mg by mouth daily before breakfast.      . insulin glargine (LANTUS) 100 UNIT/ML injection Inject 20 Units into the skin daily.       . isosorbide mononitrate (IMDUR) 30 MG 24 hr tablet Take 0.5 tablets (15 mg total) by mouth daily.  30 tablet  5  . labetalol (NORMODYNE) 200 MG tablet Take 600 mg by mouth 2 (two) times daily.      . nicotine polacrilex (NICORETTE) 4 MG gum Take 1 each (4 mg total) by mouth as needed for smoking cessation.  100 tablet  3  . nitroGLYCERIN (NITROSTAT) 0.4 MG SL tablet Place 1 tablet (0.4 mg total) under the tongue every 5 (five) minutes as needed for chest pain.  25 tablet  3  . traZODone (DESYREL) 100 MG tablet Take 100 mg by mouth at bedtime.       No current facility-administered medications for this visit.    LABS/IMAGING: No results found for this or any previous visit (from the past 48 hour(s)). No results found.  VITALS: BP 138/92  Pulse 88  Ht 5\' 11"  (1.803 m)  Wt 223 lb 14.4 oz (101.56 kg)  BMI 31.24 kg/m2  EXAM: deferred  EKG: deferred  ASSESSMENT: 1. Coronary artery spasm 2. Hypertension - controlled 3. Insulin-dependent diabetes-uncontrolled 4. Alcohol dependence, currently sober 5. Tobacco dependence - reducing tobacco intake, now requesting nicotine replacment 6. Dyslipidemia - started on statin  PLAN: 1.   Grant [name] reports feeling better after his catheterization. This may be a combination of his new medications as well as increased exercise. I've encouraged him to continue to push his activity which should help with his fatigue. In addition he's cut back on his smoking significantly which I feel is incredibly helpful spars reducing coronary spasm. He is also on a statin now, and this should help with small vessel spasm. He told me that he recently went to donate blood because of the possibility that his hematocrit was elevated, however his preoperative  laboratory work did not support this. During that workup he was found to be reactive with regards to hepatitis B core antibodies, however his hepatitis B surface antigen and PCR were both negative. This would suggest that he may been exposed to Hepatitis B before, but is not actively viremic. I would recommend that he follows up with his primary care provider and possibly see a hepatologist for further testing.  Thank you for referring Grant [name]. If I can be of further assistance for him or other patient please don't hesitate to contact me.  I'll plan to see him back in 6 months or sooner as needed  Chrystie Nose, MD, [facility] Attending Cardiologist [facility]  Grant [name] 08/17/2013, 11:38 AM

## 2013-08-21 ENCOUNTER — Telehealth: Payer: Self-pay | Admitting: Internal Medicine

## 2013-08-21 ENCOUNTER — Encounter: Payer: Self-pay | Admitting: *Deleted

## 2013-08-21 NOTE — Telephone Encounter (Signed)
Message forwarded to J. Elkins, RN.  

## 2013-08-21 NOTE — Telephone Encounter (Signed)
Grant [name] is calling because he needs a return to work letter from Dr. Rennis Golden that says no restrictions to return to work on Monday.  , that is what his Oncologist office is asking for . Please fax it over to her at this number .Marland Kitchen [phone] Attention Grant [name] .Marland Kitchen If any questiopns please call Grant [name] at [phone]... Please call and let him know that the fax was sent over to his job .Marland Kitchen    Thanks

## 2013-08-21 NOTE — Telephone Encounter (Signed)
Faxed return to work letter to patient's place of work (Attn: Melvenia Needles in [place])

## 2013-08-25 ENCOUNTER — Telehealth: Payer: Self-pay | Admitting: Oncology

## 2013-08-25 NOTE — Telephone Encounter (Signed)
Pt called to schedule his np appt 10/08 @ 10:30 w/Dr. Clelia Croft Referring Dr. Jarold Motto Dx-Polycytemia Welcome packet mailed

## 2013-08-25 NOTE — Telephone Encounter (Signed)
C/D 08/25/13 for appt. 09/02/13

## 2013-08-28 ENCOUNTER — Other Ambulatory Visit: Payer: Self-pay | Admitting: Oncology

## 2013-08-28 DIAGNOSIS — D751 Secondary polycythemia: Secondary | ICD-10-CM

## 2013-09-02 ENCOUNTER — Other Ambulatory Visit (HOSPITAL_BASED_OUTPATIENT_CLINIC_OR_DEPARTMENT_OTHER): Payer: BC Managed Care – PPO | Admitting: Lab

## 2013-09-02 ENCOUNTER — Ambulatory Visit (HOSPITAL_BASED_OUTPATIENT_CLINIC_OR_DEPARTMENT_OTHER): Payer: BC Managed Care – PPO | Admitting: Oncology

## 2013-09-02 ENCOUNTER — Ambulatory Visit: Payer: BC Managed Care – PPO

## 2013-09-02 VITALS — BP 138/104 | HR 103 | Temp 97.4°F | Resp 20 | Ht 71.0 in | Wt 226.5 lb

## 2013-09-02 DIAGNOSIS — F17200 Nicotine dependence, unspecified, uncomplicated: Secondary | ICD-10-CM

## 2013-09-02 DIAGNOSIS — D751 Secondary polycythemia: Secondary | ICD-10-CM

## 2013-09-02 DIAGNOSIS — E119 Type 2 diabetes mellitus without complications: Secondary | ICD-10-CM

## 2013-09-02 LAB — COMPREHENSIVE METABOLIC PANEL (CC13)
ALT: 17 U/L (ref 0–55)
Alkaline Phosphatase: 67 U/L (ref 40–150)
Sodium: 138 mEq/L (ref 136–145)
Total Bilirubin: 0.9 mg/dL (ref 0.20–1.20)
Total Protein: 7.4 g/dL (ref 6.4–8.3)

## 2013-09-02 LAB — CBC WITH DIFFERENTIAL/PLATELET
BASO%: 0.2 % (ref 0.0–2.0)
LYMPH%: 25.8 % (ref 14.0–49.0)
MCH: 31.1 pg (ref 27.2–33.4)
MCHC: 35.4 g/dL (ref 32.0–36.0)
MCV: 88 fL (ref 79.3–98.0)
MONO%: 7.2 % (ref 0.0–14.0)
Platelets: 222 10*3/uL (ref 140–400)
RBC: 5.33 10*6/uL (ref 4.20–5.82)
WBC: 8 10*3/uL (ref 4.0–10.3)

## 2013-09-02 LAB — FERRITIN CHCC: Ferritin: 173 ng/ml (ref 22–316)

## 2013-09-02 NOTE — Progress Notes (Signed)
Please see consult note.  

## 2013-09-02 NOTE — Consult Note (Signed)
Reason for Referral: Polycythemia.   HPI: 42 year old gentleman with history of hypertension and mild obesity nephrectomy for the evaluation of polycythemia. He has also history of tobacco alcohol abuse and have been trying to cut down significantly. He has cut down smoking to 5 cigarettes a day and have eliminated drinking altogether the last holidays. He had a CBC checked on 07/30/2013 and his hemoglobin was 19.4. He was instructed to donate blood which he did and a repeat CBC on 08/06/2013 hemoglobin of 17.2. Historically, he had a hemoglobin as high as 18.4 5 years ago. He also have developed chest pain or shortness of breath and was evaluated by cardiology including a cardiac catheterization and was diagnosed with vasospasm. He is also under evaluation for possible sleep apnea.  Clinically, he is feeling a lot better at this time. He does report occasional chest pain but this manageable. He does not report any shortness of breath or difficulty breathing. 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 Has not reported any weight loss or appetite changes. Does not report any cough or hemoptysis. I plan nausea or vomiting. Does not report any bleeding or thrombosis history.   Past Medical History  Diagnosis Date  . Type 2 diabetes mellitus   . Neuropathy   :  Past Surgical History  Procedure Laterality Date  . Back lipoma resection  03/2006  . Knee arthroscopy  10/2008    left  . Upper gi endoscopy  10/2007    showed gastritis  :  Current outpatient prescriptions:amLODipine (NORVASC) 5 MG tablet, Take 1 tablet (5 mg total) by mouth daily., Disp: 30 tablet, Rfl: 5;  aspirin EC 81 MG tablet, Take 1 tablet (81 mg total) by mouth daily., Disp: , Rfl: ;  atorvastatin (LIPITOR) 20 MG tablet, Take 1 tablet (20 mg total) by mouth at bedtime., Disp: 30 tablet, Rfl: 5;  glimepiride (AMARYL) 2 MG tablet, Take 2 mg by mouth daily before breakfast., Disp: , Rfl:   insulin glargine (LANTUS) 100 UNIT/ML injection, Inject 20 Units into the skin daily. , Disp: , Rfl: ;  isosorbide mononitrate (IMDUR) 30 MG 24 hr tablet, Take 0.5 tablets (15 mg total) by mouth daily., Disp: 30 tablet, Rfl: 5;  labetalol (NORMODYNE) 200 MG tablet, Take 600 mg by mouth 2 (two) times daily., Disp: , Rfl:  nicotine polacrilex (NICORETTE) 4 MG gum, Take 1 each (4 mg total) by mouth as needed for smoking cessation., Disp: 100 tablet, Rfl: 3;  nitroGLYCERIN (NITROSTAT) 0.4 MG SL tablet, Place 1 tablet (0.4 mg total) under the tongue every 5 (five) minutes as needed for chest pain., Disp: 25 tablet, Rfl: 3;  traZODone (DESYREL) 100 MG tablet, Take 100 mg by mouth at bedtime., Disp: , Rfl: :  Allergies  Allergen Reactions  . Tylox [Oxycodone-Acetaminophen] Nausea And Vomiting  :  Family History  Problem Relation Age of Onset  . Diabetes Mother   . Hypertension Mother   . Hypertension Father   . Hyperlipidemia Father   . Brain cancer      grandparent  . Stomach cancer      grandparent  :  History   Social History  . Marital Status: Married    Spouse Name: N/A    Number of Children: N/A  . Years of Education: N/A   Occupational History  . Not on file.   Social History Main Topics  . Smoking status: Current Every Day Smoker -- 0.33 packs/day for 20 years  .  Smokeless tobacco: Current User    Types: Chew     Comment: 5-7 cigarettes/day; chews occasionally  . Alcohol Use: No  . Drug Use: No  . Sexual Activity: Not on file   Other Topics Concern  . Not on file   Social History Narrative  . No narrative on file  :  A comprehensive review of systems was negative.  Exam: ECOG 0 Blood pressure 138/104, pulse 103, temperature 97.4 F (36.3 C), temperature source Oral, resp. rate 20, height 5\' 11"  (1.803 m), weight 226 lb 8 oz (102.74 kg). General appearance: alert, cooperative and appears stated age Head: Normocephalic, without obvious abnormality,  atraumatic Nose: Nares normal. Septum midline. Mucosa normal. No drainage or sinus tenderness. Throat: lips, mucosa, and tongue normal; teeth and gums normal Neck: no adenopathy, no carotid bruit, no JVD, supple, symmetrical, trachea midline and thyroid not enlarged, symmetric, no tenderness/mass/nodules Resp: clear to auscultation bilaterally Cardio: regular rate and rhythm, S1, S2 normal, no murmur, click, rub or gallop GI: soft, non-tender; bowel sounds normal; no masses,  no organomegaly Extremities: extremities normal, atraumatic, no cyanosis or edema Pulses: 2+ and symmetric Skin: Skin color, texture, turgor normal. No rashes or lesions Lymph nodes: Cervical, supraclavicular, and axillary nodes normal. Neurologic: Grossly normal   Recent Labs  09/02/13 1023  WBC 8.0  HGB 16.6  HCT 46.9  PLT 222    Recent Labs  09/02/13 1024  NA 138  K 4.5  CO2 26  GLUCOSE 261*  BUN 9.1  CREATININE 0.8  CALCIUM 9.9     Dg Chest 2 View  08/06/2013   *RADIOLOGY REPORT*  Clinical Data: Chest pain and shortness of breath; preoperative evaluation  CHEST - 2 VIEW  Comparison: None.  Findings:    There is no edema or consolidation.  Heart size and pulmonary vascularity are normal. No adenopathy.  No bone lesions.  Impression:  No abnormality noted.   Original Report Authenticated By: Bretta Bang, M.D.    Assessment and Plan:   42 year old gentleman with the following issues:  1. Polycythemia with a hemoglobin as has 19 in September of 2014. He have had blood counts indicating a hemoglobin of 18.4. He did have a blood donation in September and his hemoglobin now is back to normal. The differential diagnosis was discussed today with the patient these include secondary causes due to heavy smoking history, toxic to see him exposure and possible sleep apnea. I've explained to him chronic hypoxia due to these factors can certainly cause a hemoglobin to be elevated. I doubt he has polycythemia  vera were primary myeloproliferative disorder giving his findings. I do not think he has occult malignancy either as he has been properly imaged in the last few months.  From a management standpoint, I see no reason for any therapeutic phlebotomies at this time. His hemoglobin is back to normal and he has cut down on smoking dramatically. He is scheduled to have a sleep study which we'll further evaluate him for sleep apnea and certainly treat that would help his future hemoglobin. I see no further workup with a bone marrow biopsy or any imaging studies.  2.Followup: I will be happy to see Grant [name] in the future as needed.

## 2013-09-14 ENCOUNTER — Encounter: Payer: Self-pay | Admitting: Neurology

## 2013-09-14 ENCOUNTER — Ambulatory Visit (INDEPENDENT_AMBULATORY_CARE_PROVIDER_SITE_OTHER): Payer: BC Managed Care – PPO | Admitting: Neurology

## 2013-09-14 ENCOUNTER — Encounter (INDEPENDENT_AMBULATORY_CARE_PROVIDER_SITE_OTHER): Payer: Self-pay

## 2013-09-14 VITALS — BP 158/110 | HR 98 | Temp 98.2°F | Ht 73.5 in | Wt 227.0 lb

## 2013-09-14 DIAGNOSIS — E1149 Type 2 diabetes mellitus with other diabetic neurological complication: Secondary | ICD-10-CM

## 2013-09-14 DIAGNOSIS — E1140 Type 2 diabetes mellitus with diabetic neuropathy, unspecified: Secondary | ICD-10-CM

## 2013-09-14 DIAGNOSIS — E1142 Type 2 diabetes mellitus with diabetic polyneuropathy: Secondary | ICD-10-CM

## 2013-09-14 DIAGNOSIS — R0981 Nasal congestion: Secondary | ICD-10-CM

## 2013-09-14 DIAGNOSIS — G4733 Obstructive sleep apnea (adult) (pediatric): Secondary | ICD-10-CM

## 2013-09-14 DIAGNOSIS — F17200 Nicotine dependence, unspecified, uncomplicated: Secondary | ICD-10-CM

## 2013-09-14 DIAGNOSIS — J3489 Other specified disorders of nose and nasal sinuses: Secondary | ICD-10-CM

## 2013-09-14 MED ORDER — FLUTICASONE PROPIONATE 50 MCG/ACT NA SUSP: 1.0000 | Freq: Every day | NASAL | Status: DC

## 2013-09-14 NOTE — Progress Notes (Signed)
Subjective:    Patient ID: Grant [name] is a 42 y.o. male.  HPI  Huston Foley, MD, PhD [facility] [address] [address] [place], [place] [address]  Dear Dr. Eloise Harman,   I saw your patient, Grant [name], upon your kind request in my neurologic clinic today for initial consultation of his sleep disorder, in particular concern for obstructive sleep apnea in the context of disrupted sleep and daytime tiredness as well as snoring reported. The patient is unaccompanied today. As you know, Grant [name] is a very pleasant 42 year old right-handed gentleman with an underlying medical history of hypertension, HLP, recent Dx of CAD (LHC on 08/11/13), smoking, history of panic attacks, type 2 diabetes with neuropathy, history of alcohol abuse and polysubstance abuse, gastritis, recent confirmation of polycythemia, who has had significant difficulty maintaining sleep for quite some time. He reports daytime tiredness and non-restorative sleep. He quit drinking in June of this year and since he has been placed on medication for his heart, he feels better with regards to CP and SOB. He goes to bed around 10:30-11 PM and falls asleep within 30 minutes and has been taking trazodone 50 mg nightly. He has has to get up at 5:45 AM. He does not nap. He has come close to falling asleep at the wheel. He has some RLS Sx, but is not sure if he kicks in his sleep. He has significant neuropathy.   He has been using Afrin or similar nasal decongestant sprays regularly each night. Once he has tried Xanax to relax him at night.  He reports excessive daytime somnolence (EDS) and His Epworth Sleepiness Score (ESS) is 6/24 today.  He has been known to snore for the past many years. Snoring is reportedly moderate, and associated with choking sounds and witnessed apneas. The patient denies a sense of choking or strangling feeling. There is no report of nighttime reflux, with no nighttime cough  experienced. He is a restless sleeper and in the morning, the bed is quite disheveled.   He denies cataplexy, sleep paralysis, hypnagogic or hypnopompic hallucinations, or sleep attacks. He does not report any vivid dreams, nightmares, dream enactments, or parasomnias, such as sleep talking or sleep walking. The patient has not had a sleep study or a home sleep test.  He consumes 4 caffeinated beverages per day, usually in the form of coffee, and sodas, also with dinner.  His bedroom is usually dark and cool.   His Past Medical History Is Significant For: Past Medical History  Diagnosis Date  . Type 2 diabetes mellitus   . Neuropathy     His Past Surgical History Is Significant For: Past Surgical History  Procedure Laterality Date  . Back lipoma resection  03/2006  . Knee arthroscopy  10/2008    left  . Upper gi endoscopy  10/2007    showed gastritis    His Family History Is Significant For: Family History  Problem Relation Age of Onset  . Diabetes Mother   . Hypertension Mother   . Hypertension Father   . Hyperlipidemia Father   . Brain cancer      grandparent  . Stomach cancer      grandparent    His Social History Is Significant For: History   Social History  . Marital Status: Married    Spouse Name: N/A    Number of Children: N/A  . Years of Education: N/A   Social History Main Topics  . Smoking status: Current  Every Day Smoker -- 0.33 packs/day for 20 years  . Smokeless tobacco: Current User    Types: Chew     Comment: 5-7 cigarettes/day; chews occasionally  . Alcohol Use: No  . Drug Use: No  . Sexual Activity: None   Other Topics Concern  . None   Social History Narrative  . None    His Allergies Are:  Allergies  Allergen Reactions  . Tylox [Oxycodone-Acetaminophen] Nausea And Vomiting  :   His Current Medications Are:  Outpatient Encounter Prescriptions as of 09/14/2013  Medication Sig Dispense Refill  . amLODipine (NORVASC) 5 MG tablet Take  1 tablet (5 mg total) by mouth daily.  30 tablet  5  . aspirin EC 81 MG tablet Take 1 tablet (81 mg total) by mouth daily.      Marland Kitchen atorvastatin (LIPITOR) 20 MG tablet Take 1 tablet (20 mg total) by mouth at bedtime.  30 tablet  5  . glimepiride (AMARYL) 2 MG tablet Take 2 mg by mouth daily before breakfast.      . insulin glargine (LANTUS) 100 UNIT/ML injection Inject 20 Units into the skin daily.       . isosorbide mononitrate (IMDUR) 30 MG 24 hr tablet Take 0.5 tablets (15 mg total) by mouth daily.  30 tablet  5  . labetalol (NORMODYNE) 200 MG tablet Take 600 mg by mouth 2 (two) times daily.      . traZODone (DESYREL) 100 MG tablet Take 100 mg by mouth at bedtime.      . fluticasone (FLONASE) 50 MCG/ACT nasal spray Place 1 spray into the nose daily.  16 g  2  . nicotine polacrilex (NICORETTE) 4 MG gum Take 1 each (4 mg total) by mouth as needed for smoking cessation.  100 tablet  3  . nitroGLYCERIN (NITROSTAT) 0.4 MG SL tablet Place 1 tablet (0.4 mg total) under the tongue every 5 (five) minutes as needed for chest pain.  25 tablet  3   No facility-administered encounter medications on file as of 09/14/2013.   Review of Systems:  Out of a complete 14 point review of systems, all are reviewed and negative with the exception of these symptoms as listed below:  Review of Systems  Constitutional: Positive for fatigue.  Respiratory:       Snoring   Cardiovascular: Positive for chest pain.  Musculoskeletal: Positive for arthralgias.  Psychiatric/Behavioral: Positive for sleep disturbance. The patient is nervous/anxious.     Objective:  Neurologic Exam  Physical Exam Physical Examination:   Filed Vitals:   09/14/13 0833  BP: 158/110  Pulse: 98  Temp: 98.2 F (36.8 C)    General Examination: The patient is a very pleasant 42 y.o. male in no acute distress. He appears well-developed and well-nourished and adequately groomed.   HEENT: Normocephalic, atraumatic, pupils are equal,  round and reactive to light and accommodation. Funduscopic exam is normal with sharp disc margins noted. Extraocular tracking is good without limitation to gaze excursion or nystagmus noted. Normal smooth pursuit is noted. Hearing is grossly intact. Tympanic membranes are clear bilaterally. Face is symmetric with normal facial animation and normal facial sensation. Speech is clear with no dysarthria noted. There is no hypophonia. There is no lip, neck/head, jaw or voice tremor. Neck is supple with full range of passive and active motion. There are no carotid bruits on auscultation. Oropharynx exam reveals: mild mouth dryness, adequate dental hygiene and moderate airway crowding, due to enlarged and elongated uvula. There is  mild pharyngeal erythema. Tonsils are absent. Tongue seems to be slightly elongated as well. Mallampati is class II. Tongue protrudes centrally and palate elevates symmetrically. Tonsils are absent. Neck size is 17 3/8 inches. Nasal inspection demonstrates significant mucosal swelling. There is certainly no mostly on the right side but the swelling and tightness is mostly on the left nostril. His septum is deviated to the left.  Chest: Clear to auscultation without wheezing, rhonchi or crackles noted.  Heart: S1+S2+0, regular and normal without murmurs, rubs or gallops noted.   Abdomen: Soft, non-tender and non-distended with normal bowel sounds appreciated on auscultation.  Extremities: There is no pitting edema in the distal lower extremities bilaterally. Pedal pulses are intact.  Skin: Warm and dry without trophic changes noted. There are no varicose veins.  Musculoskeletal: exam reveals no obvious joint deformities, tenderness or joint swelling or erythema.   Neurologically:  Mental status: The patient is awake, alert and oriented in all 4 spheres. His memory, attention, language and knowledge are appropriate. There is no aphasia, agnosia, apraxia or anomia. Speech is clear  with normal prosody and enunciation. Thought process is linear. Mood is congruent and affect is normal.  Cranial nerves are as described above under HEENT exam. In addition, shoulder shrug is normal with equal shoulder height noted. Motor exam: Normal bulk, strength and tone is noted. There is no drift, tremor or rebound. Romberg is negative. Reflexes are 2+ throughout. Toes are downgoing bilaterally. Fine motor skills are intact with normal finger taps, normal hand movements, normal rapid alternating patting, normal foot taps and normal foot agility.  Cerebellar testing shows no dysmetria or intention tremor on finger to nose testing. Heel to shin is unremarkable bilaterally. There is no truncal or gait ataxia.  Sensory exam is intact to light touch, pinprick, vibration, temperature sense in the upper and lower extremities, with the exception of decreased pinprick sensation in the distal lower extremities up to mid shin area.  Gait, station and balance are unremarkable. No veering to one side is noted. No leaning to one side is noted. Posture is age-appropriate and stance is narrow based. No problems turning are noted. He turns en bloc. Tandem walk is unremarkable. Intact toe and heel stance is noted.               Assessment and Plan:   In summary, Grant [name] is a very pleasant 42 y.o.-year old male with a history of heart disease, diabetes, hypertension, with a history and physical exam highly concerning for obstructive sleep apnea (OSA). He has evidence of neuropathy, most likely diabetic in etiology. While his funduscopic exam was unremarkable with respect to his optic disc, he is advised to schedule an appointment with his eye doctor because of his diabetes. He has not seen an eye doctor in 2-3 years. His recent hemoglobin A1c was 9.3 and since then he has had an increase in his Lantus insulin dose. I had a long chat with the patient about my findings and the diagnosis, its prognosis and  treatment options. We talked about medical treatments and non-pharmacological approaches. I explained in particular the risks and ramifications of untreated moderate to severe OSA, especially with respect to developing cardiovascular disease down the Road, including congestive heart failure, difficult to treat hypertension, cardiac arrhythmias, or stroke. Even type 2 diabetes has in part been linked to untreated OSA. We talked about smoking cessation I asked him to cut down on caffeine use especially in the afternoon and evening. He  is also advised to wean off of nasal decongestant spray such as Afrin. He has quite significant nasal congestion at this time and I will start him on Flonase. I talked to him about trying to maintain a healthy lifestyle in general, as well as the importance of weight control. I encouraged the patient to eat healthy, exercise daily and keep well hydrated, to keep a scheduled bedtime and wake time routine, to not skip any meals and eat healthy snacks in between meals.  I recommended the following at this time: sleep study with potential positive airway pressure titration.  I explained the sleep test procedure to the patient and also outlined possible surgical and non-surgical treatment options of OSA, including the use of a custom-made dental device, upper airway surgical options, such as pillar implants, radiofrequency surgery, tongue base surgery, and UPPP. I also explained the CPAP treatment option to the patient, who indicated that he would be willing to try CPAP if the need arises. I explained the importance of being compliant with PAP treatment, not only for insurance purposes but primarily to improve His symptoms, and for the patient's long term health benefit, including to reduce His cardiovascular risks. I answered all his questions today and the patient was in agreement. I would like to see him back after the sleep study is completed and encouraged him to call with any interim  questions, concerns, problems or updates.   Thank you very much for allowing me to participate in the care of this nice patient. If I can be of any further assistance to you please do not hesitate to call me at [phone].  Sincerely,   Huston Foley, MD, PhD

## 2013-09-14 NOTE — Patient Instructions (Addendum)
Based on your symptoms and your exam I believe you are at risk for obstructive sleep apnea or OSA, and I think we should proceed with a sleep study to determine whether you do or do not have OSA and how severe it is. If you have more than mild OSA, I want you to consider treatment with CPAP. Please remember, the risks and ramifications of moderate to severe obstructive sleep apnea or OSA are: Cardiovascular disease, including congestive heart failure, stroke, difficult to control hypertension, arrhythmias, and even type 2 diabetes has been linked to untreated OSA. Sleep apnea causes disruption of sleep and sleep deprivation in most cases, which, in turn, can cause recurrent headaches, problems with memory, mood, concentration, focus, and vigilance. Most people with untreated sleep apnea report excessive daytime sleepiness, which can affect their ability to drive. Please do not drive if you feel sleepy.  I will see you back after your sleep study to go over the test results and where to go from there. We will call you after your sleep study and to set up an appointment at the time.   Please reduce and then stop your nasal spray and I will prescribe Flonase for you to use in each nostril each night.   Please remember to try to maintain good sleep hygiene, which means: Keep a regular sleep and wake schedule, try not to exercise or have a meal within 2 hours of your bedtime, try to keep your bedroom conducive for sleep, that is, cool and dark, without light distractors such as an illuminated alarm clock, and refrain from watching TV right before sleep or in the middle of the night and do not keep the TV or radio on during the night. Also, try not to use or play on electronic devices at bedtime, such as your cell phone, tablet PC or laptop. If you like to read at bedtime on an electronic device, try to dim the background light as much as possible.

## 2013-09-29 ENCOUNTER — Ambulatory Visit (INDEPENDENT_AMBULATORY_CARE_PROVIDER_SITE_OTHER): Payer: BC Managed Care – PPO

## 2013-09-29 DIAGNOSIS — R0981 Nasal congestion: Secondary | ICD-10-CM

## 2013-09-29 DIAGNOSIS — G4761 Periodic limb movement disorder: Secondary | ICD-10-CM

## 2013-09-29 DIAGNOSIS — G479 Sleep disorder, unspecified: Secondary | ICD-10-CM

## 2013-09-29 DIAGNOSIS — G4733 Obstructive sleep apnea (adult) (pediatric): Secondary | ICD-10-CM

## 2013-10-09 ENCOUNTER — Telehealth: Payer: Self-pay | Admitting: Neurology

## 2013-10-09 NOTE — Telephone Encounter (Signed)
Please call and notify the patient that the recent sleep study did not show any significant obstructive sleep apnea, except when he sleeps on his back. There was significant sleep disruption and he has periodic leg movements of sleep, causing some of his sleep disturbance. Please inform patient that I would like to go over the details of the study during a follow up appointment and if not already previously scheduled, arrange a followup appointment (please utilize a followu-up slot). Also, route or fax report to PCP and referring MD, if other than PCP.  Once you have spoken to patient, you can close this encounter.   Thanks,  Huston Foley, MD, PhD [facility])

## 2013-10-12 ENCOUNTER — Telehealth: Payer: Self-pay | Admitting: Neurology

## 2013-10-12 ENCOUNTER — Encounter: Payer: Self-pay | Admitting: *Deleted

## 2013-10-12 NOTE — Telephone Encounter (Signed)
Patient called and stated he didn't see a need to make an appointment to see Dr. Frances Furbish to discuss his sleep results in detail.

## 2013-10-12 NOTE — Telephone Encounter (Signed)
I called and left a message for the patient that his recent sleep study didn't show any significant obstructive sleep apnea, except on his back and that there was a significant sleep disruption with leg movements. I also informed the patient to callback to the office to schedule a follow up because Dr. Frances Furbish wishes to go over his results in detail.

## 2014-11-04 ENCOUNTER — Encounter (HOSPITAL_COMMUNITY): Payer: Self-pay | Admitting: Internal Medicine

## 2015-05-06 ENCOUNTER — Encounter (HOSPITAL_COMMUNITY): Payer: BC Managed Care – PPO

## 2015-11-30 ENCOUNTER — Encounter (INDEPENDENT_AMBULATORY_CARE_PROVIDER_SITE_OTHER): Payer: Self-pay

## 2015-11-30 ENCOUNTER — Ambulatory Visit (INDEPENDENT_AMBULATORY_CARE_PROVIDER_SITE_OTHER): Payer: BC Managed Care – PPO | Admitting: Neurology

## 2015-11-30 ENCOUNTER — Encounter: Payer: Self-pay | Admitting: Neurology

## 2015-11-30 ENCOUNTER — Telehealth: Payer: Self-pay

## 2015-11-30 VITALS — BP 130/90 | HR 60 | Ht 71.0 in | Wt 213.5 lb

## 2015-11-30 DIAGNOSIS — M545 Low back pain, unspecified: Secondary | ICD-10-CM

## 2015-11-30 DIAGNOSIS — E1142 Type 2 diabetes mellitus with diabetic polyneuropathy: Secondary | ICD-10-CM

## 2015-11-30 DIAGNOSIS — G8929 Other chronic pain: Secondary | ICD-10-CM | POA: Diagnosis not present

## 2015-11-30 HISTORY — DX: Low back pain, unspecified: M54.50

## 2015-11-30 HISTORY — DX: Other chronic pain: G89.29

## 2015-11-30 HISTORY — DX: Type 2 diabetes mellitus with diabetic polyneuropathy: E11.42

## 2015-11-30 NOTE — Progress Notes (Signed)
Reason for visit: Right back and leg pain  Referring physician: Dr. Virgel [name] Grant [name] is a 46 y.o. male  History of present illness:  Grant [name] is a 45 year old right-handed white male with a history of diabetes. The patient indicates that he had a work-related injury that occurred 2 years ago while lifting a 50 pound weight. The patient had onset of right-sided back pain and right hip discomfort with pain down to the knee area. He may have some numbness sensations in this area as well. The patient has undergone evaluation through Dr. Tonita [name]. MRI of the lumbar spine was done in March 2015, this is brought for my review. This appears to be relatively unremarkable, no evidence of nerve root injury or spinal stenosis is seen. The patient indicates that he had MRI of the right hip as well, he was told there was a labral tear and a cyst in the hip area. The second opinion through [employer]. indicated that the problem was not in his back, but rather in the right hip area. The patient also reports problems with a diabetic peripheral neuropathy. He has numbness and stinging sensations in the feet, he has some imbalance issues. He has fallen on occasion. Occasionally, he may have some sharp pains in the fingertips on the hands. The patient denies issues controlling the bowels or the bladder. He does believe that there is some weakness of the right leg. He reports that he has pain while standing, sitting, and lying down. The pain is particularly severe when he has any leg movement across the hip joint, particularly when he is just getting out of a chair. He is sent to this office for an evaluation. He reports that he had EMG and nerve conduction study evaluation by Dr. Melton [name] in March 2015. The report of the study is not available to me. He recently was placed on nortriptyline at nighttime for the neuropathy pain, working up to a 50 mg nightly dose.  Past Medical History  Diagnosis Date  . Type 2  diabetes mellitus ([place])   . Neuropathy ([place])   . Hypertension   . Diabetic peripheral neuropathy ([place]) 11/30/2015  . Chronic low back pain 11/30/2015    Past Surgical History  Procedure Laterality Date  . Back lipoma resection  03/2006  . Knee arthroscopy  10/2008    left  . Upper gi endoscopy  10/2007    showed gastritis  . Left heart catheterization with coronary angiogram N/A 08/11/2013    Procedure: LEFT HEART CATHETERIZATION WITH CORONARY ANGIOGRAM;  Surgeon: Grant Casino, MD;  Location: [facility] CATH LAB;  Service: Cardiovascular;  Laterality: N/A;    Family History  Problem Relation Age of Onset  . Diabetes Mother   . Hypertension Mother   . Hypertension Father   . Hyperlipidemia Father   . Brain cancer      grandparent  . Stomach cancer      grandparent  . Alcohol abuse Brother     Social history:  reports that he has been smoking.  His smokeless tobacco use includes Chew. He reports that he does not drink alcohol or use illicit drugs.  Medications:  Prior to Admission medications   Medication Sig Start Date End Date Taking? Authorizing Provider  aspirin EC 81 MG tablet Take 1 tablet (81 mg total) by mouth daily. 08/11/13  Yes Grant [name] Noe, PA-C  insulin NPH Human (HUMULIN N,NOVOLIN N) 100 UNIT/ML injection Inject into the skin.  Yes Historical Provider, MD  labetalol (NORMODYNE) 200 MG tablet Take 600 mg by mouth 2 (two) times daily.   Yes Historical Provider, MD  nortriptyline (PAMELOR) 25 MG capsule Take 50 mg by mouth at bedtime.   Yes Historical Provider, MD  traZODone (DESYREL) 100 MG tablet Take 100 mg by mouth at bedtime.   Yes Historical Provider, MD      Allergies  Allergen Reactions  . Tylox [Oxycodone-Acetaminophen] Nausea And Vomiting    ROS:  Out of a complete 14 system review of symptoms, the patient complains only of the following symptoms, and all other reviewed systems are negative.  Fatigue Numbness, weakness Not enough sleep, decreased  energy Insomnia  Blood pressure 130/90, pulse 60, height 5\' 11"  (1.803 m), weight 213 lb 8 oz (96.843 kg).  Physical Exam  General: The patient is alert and cooperative at the time of the examination.  Eyes: Pupils are equal, round, and reactive to light. Discs are flat bilaterally.  Neck: The neck is supple, no carotid bruits are noted.  Respiratory: The respiratory examination is clear.  Cardiovascular: The cardiovascular examination reveals a regular rate and rhythm, no obvious murmurs or rubs are noted.  Neuromuscular: The patient reports severe pain with minimal rotational movement of the right hip.  Skin: Extremities are without significant edema.  Neurologic Exam  Mental status: The patient is alert and oriented x 3 at the time of the examination. The patient has apparent normal recent and remote memory, with an apparently normal attention span and concentration ability.  Cranial nerves: Facial symmetry is present. There is good sensation of the face to pinprick and soft touch bilaterally. The strength of the facial muscles and the muscles to head turning and shoulder shrug are normal bilaterally. Speech is well enunciated, no aphasia or dysarthria is noted. Extraocular movements are full. Visual fields are full. The tongue is midline, and the patient has symmetric elevation of the soft palate. No obvious hearing deficits are noted.  Motor: The motor testing reveals 5 over 5 strength of all 4 extremities. Good symmetric motor tone is noted throughout.  Sensory: Sensory testing is intact to pinprick, soft touch, vibration sensation, and position sense on the upper extremities. With the lower extremities, the patient has a stocking pattern pinprick sensory deficit two thirds the way up the leg bilaterally, significant impairment of vibration sensation and position sensation is noted in the feet. No evidence of extinction is noted.  Coordination: Cerebellar testing reveals good  finger-nose-finger and heel-to-shin bilaterally.  Gait and station: Gait is normal. Tandem gait is slightly unsteady. Romberg is negative. No drift is seen. The patient is able to walk on heels and the toes.  Reflexes: Deep tendon reflexes are symmetric and normal bilaterally, with exception that the ankle jerk reflexes are depressed absent bilaterally. Toes are downgoing bilaterally.   MRI lumbar 03/08/10:  IMPRESSION: Mild lumbar degenerative changes as above. Small central disc protrusions at L4-5 and L5-S1 without neural impingement.  * MRI scan images were reviewed online. I agree with the written report.    Assessment/Plan:  1. Peripheral neuropathy, likely related to diabetes  2. Right hip and back pain  The clinical examination reveals that the patient is having severe pain with minimal rotational movement across the hip suggesting an intrinsic hip disease. The patient has been told that he has a labral tear, and a cyst involving the hip on the right. This may be the source at least in part of his discomfort. The  patient desires a second opinion through another orthopedic surgeon, I will try to get this set up. The patient also has discomfort in the feet associated with his neuropathy. He will be sent for blood work to evaluate for other etiologies of neuropathy. The report of the prior nerve conduction study will be obtained through Dr. Melton [name]. The patient will follow-up in 4 months. He has been placed on nortriptyline for the neuropathy pain which is reasonable.  Jill Alexanders MD 11/30/2015 1:32 PM  [facility] [address] [place], [place] [address]  Phone [phone] Fax [phone]

## 2015-11-30 NOTE — Telephone Encounter (Signed)
I called Grant [name] Headache and Neck Pain. I left a voicemail asking that someone call me back about this patient.

## 2015-11-30 NOTE — Patient Instructions (Signed)

## 2015-12-02 LAB — MULTIPLE MYELOMA PANEL, SERUM
ALBUMIN/GLOB SERPL: 1.3 (ref 0.7–1.7)
ALPHA2 GLOB SERPL ELPH-MCNC: 1 g/dL (ref 0.4–1.0)
Albumin SerPl Elph-Mcnc: 4 g/dL (ref 2.9–4.4)
Alpha 1: 0.2 g/dL (ref 0.0–0.4)
B-GLOBULIN SERPL ELPH-MCNC: 1.3 g/dL (ref 0.7–1.3)
Gamma Glob SerPl Elph-Mcnc: 0.7 g/dL (ref 0.4–1.8)
Globulin, Total: 3.2 g/dL (ref 2.2–3.9)
IGG (IMMUNOGLOBIN G), SERUM: 683 mg/dL — AB (ref 700–1600)
IgA/Immunoglobulin A, Serum: 201 mg/dL (ref 90–386)
IgM (Immunoglobulin M), Srm: 14 mg/dL — ABNORMAL LOW (ref 20–172)
Total Protein: 7.2 g/dL (ref 6.0–8.5)

## 2015-12-02 LAB — VITAMIN B12: VITAMIN B 12: 751 pg/mL (ref 211–946)

## 2015-12-02 LAB — SEDIMENTATION RATE: Sed Rate: 2 mm/hr (ref 0–15)

## 2015-12-02 LAB — ANGIOTENSIN CONVERTING ENZYME: Angio Convert Enzyme: 59 U/L (ref 14–82)

## 2015-12-02 LAB — RHEUMATOID FACTOR

## 2015-12-02 LAB — COPPER, SERUM: Copper: 106 ug/dL (ref 72–166)

## 2015-12-06 ENCOUNTER — Telehealth: Payer: Self-pay

## 2015-12-06 NOTE — Telephone Encounter (Signed)
I called the patient and left a voicemail asking he call back. Dr. Jannifer Franklin requested EMG/NVC report from Dr. Ovid Curd office. Dr. Ovid Curd office needs the patient to sign a release form. The patient can come into our office to do this or go to Dr. Ovid Curd office. Please let him know this when he calls.

## 2015-12-06 NOTE — Telephone Encounter (Signed)
-----   Message from Kathrynn Ducking, MD sent at 12/02/2015  1:44 PM EST -----  The blood work results are unremarkable. Please call the patient.  ----- Message -----    From: [facility] Lab Results In Interface    Sent: 12/01/2015   7:43 AM      To: Kathrynn Ducking, MD

## 2015-12-06 NOTE — Telephone Encounter (Signed)
Called patient and left voicemail asking him to call back for results. Results are normal, please let patient know when he calls back.

## 2015-12-06 NOTE — Telephone Encounter (Signed)
Pt returned call. Was told labs normal.

## 2015-12-06 NOTE — Telephone Encounter (Signed)
Per Ileana Roup (operator), patient called and she relayed my message below.

## 2015-12-06 NOTE — Telephone Encounter (Deleted)
-----   Message from Kathrynn Ducking, MD sent at 12/02/2015  1:44 PM EST -----  The blood work results are unremarkable. Please call the patient.  ----- Message -----    From: [facility] Lab Results In Interface    Sent: 12/01/2015   7:43 AM      To: Kathrynn Ducking, MD

## 2015-12-14 ENCOUNTER — Telehealth: Payer: Self-pay | Admitting: Neurology

## 2015-12-14 NOTE — Telephone Encounter (Signed)
I have received the report of the EMG and nerve conduction study done by Dr. Melton Alar on 02/09/2014. This study shows evidence of a sensorimotor peripheral neuropathy with axonal and demyelinating features of moderate severity. There is some suggestion of bilateral L5 and S1 nerve root dysfunction. EMG and nerve conduction studies were done on both lower extremities. The patient does have a history of low back pain.

## 2016-04-02 ENCOUNTER — Ambulatory Visit: Payer: BC Managed Care – PPO | Admitting: Neurology

## 2016-04-02 ENCOUNTER — Telehealth: Payer: Self-pay | Admitting: Neurology

## 2016-04-02 NOTE — Telephone Encounter (Signed)
This patient did not show for a revisit appointment today. 

## 2016-04-03 ENCOUNTER — Encounter: Payer: Self-pay | Admitting: Neurology

## 2016-04-03 ENCOUNTER — Telehealth: Payer: Self-pay

## 2016-04-03 NOTE — Telephone Encounter (Signed)
Pt no-showed appt yesterday. Called and offered appt at noon today. May call back to schedule.

## 2016-09-17 ENCOUNTER — Encounter (HOSPITAL_COMMUNITY): Payer: Self-pay | Admitting: Radiology

## 2016-09-17 ENCOUNTER — Emergency Department (HOSPITAL_COMMUNITY): Payer: Self-pay

## 2016-09-17 ENCOUNTER — Inpatient Hospital Stay (HOSPITAL_COMMUNITY)
Admission: EM | Admit: 2016-09-17 | Discharge: 2016-09-18 | DRG: 639 | Discharge status: Against Medical Advice | Payer: BC Managed Care – PPO | Attending: Internal Medicine | Admitting: Internal Medicine

## 2016-09-17 DIAGNOSIS — E118 Type 2 diabetes mellitus with unspecified complications: Secondary | ICD-10-CM

## 2016-09-17 DIAGNOSIS — F1011 Alcohol abuse, in remission: Secondary | ICD-10-CM | POA: Diagnosis present

## 2016-09-17 DIAGNOSIS — M545 Low back pain, unspecified: Secondary | ICD-10-CM | POA: Diagnosis present

## 2016-09-17 DIAGNOSIS — R000 Tachycardia, unspecified: Secondary | ICD-10-CM | POA: Diagnosis present

## 2016-09-17 DIAGNOSIS — E860 Dehydration: Secondary | ICD-10-CM | POA: Insufficient documentation

## 2016-09-17 DIAGNOSIS — E1310 Other specified diabetes mellitus with ketoacidosis without coma: Secondary | ICD-10-CM | POA: Diagnosis not present

## 2016-09-17 DIAGNOSIS — R112 Nausea with vomiting, unspecified: Secondary | ICD-10-CM

## 2016-09-17 DIAGNOSIS — Z794 Long term (current) use of insulin: Secondary | ICD-10-CM

## 2016-09-17 DIAGNOSIS — F17210 Nicotine dependence, cigarettes, uncomplicated: Secondary | ICD-10-CM | POA: Diagnosis present

## 2016-09-17 DIAGNOSIS — E784 Other hyperlipidemia: Secondary | ICD-10-CM

## 2016-09-17 DIAGNOSIS — K3184 Gastroparesis: Secondary | ICD-10-CM | POA: Diagnosis not present

## 2016-09-17 DIAGNOSIS — IMO0001 Reserved for inherently not codable concepts without codable children: Secondary | ICD-10-CM

## 2016-09-17 DIAGNOSIS — G8929 Other chronic pain: Secondary | ICD-10-CM | POA: Diagnosis present

## 2016-09-17 DIAGNOSIS — E1165 Type 2 diabetes mellitus with hyperglycemia: Secondary | ICD-10-CM | POA: Diagnosis present

## 2016-09-17 DIAGNOSIS — E785 Hyperlipidemia, unspecified: Secondary | ICD-10-CM | POA: Diagnosis present

## 2016-09-17 DIAGNOSIS — F411 Generalized anxiety disorder: Secondary | ICD-10-CM | POA: Diagnosis present

## 2016-09-17 DIAGNOSIS — Z7982 Long term (current) use of aspirin: Secondary | ICD-10-CM

## 2016-09-17 DIAGNOSIS — E1110 Type 2 diabetes mellitus with ketoacidosis without coma: Principal | ICD-10-CM | POA: Diagnosis present

## 2016-09-17 DIAGNOSIS — Z8249 Family history of ischemic heart disease and other diseases of the circulatory system: Secondary | ICD-10-CM

## 2016-09-17 DIAGNOSIS — E872 Acidosis, unspecified: Secondary | ICD-10-CM | POA: Insufficient documentation

## 2016-09-17 DIAGNOSIS — Z79899 Other long term (current) drug therapy: Secondary | ICD-10-CM

## 2016-09-17 DIAGNOSIS — R1032 Left lower quadrant pain: Secondary | ICD-10-CM | POA: Diagnosis present

## 2016-09-17 DIAGNOSIS — I10 Essential (primary) hypertension: Secondary | ICD-10-CM | POA: Diagnosis present

## 2016-09-17 DIAGNOSIS — R109 Unspecified abdominal pain: Secondary | ICD-10-CM | POA: Diagnosis present

## 2016-09-17 DIAGNOSIS — Z888 Allergy status to other drugs, medicaments and biological substances status: Secondary | ICD-10-CM

## 2016-09-17 DIAGNOSIS — F1021 Alcohol dependence, in remission: Secondary | ICD-10-CM

## 2016-09-17 DIAGNOSIS — Z833 Family history of diabetes mellitus: Secondary | ICD-10-CM

## 2016-09-17 DIAGNOSIS — E119 Type 2 diabetes mellitus without complications: Secondary | ICD-10-CM

## 2016-09-17 DIAGNOSIS — F17220 Nicotine dependence, chewing tobacco, uncomplicated: Secondary | ICD-10-CM | POA: Diagnosis present

## 2016-09-17 LAB — BASIC METABOLIC PANEL
ANION GAP: 8 (ref 5–15)
Anion gap: 13 (ref 5–15)
BUN: 8 mg/dL (ref 6–20)
BUN: 9 mg/dL (ref 6–20)
CHLORIDE: 103 mmol/L (ref 101–111)
CHLORIDE: 101 mmol/L (ref 101–111)
CO2: 20 mmol/L — AB (ref 22–32)
CO2: 26 mmol/L (ref 22–32)
CREATININE: 0.86 mg/dL (ref 0.61–1.24)
CREATININE: 0.83 mg/dL (ref 0.61–1.24)
Calcium: 8.6 mg/dL — ABNORMAL LOW (ref 8.9–10.3)
Calcium: 8.6 mg/dL — ABNORMAL LOW (ref 8.9–10.3)
GFR calc non Af Amer: >60 mL/min (ref >60)
GFR calc non Af Amer: >60 mL/min (ref >60)
GLUCOSE: 176 mg/dL — AB (ref 65–99)
GLUCOSE: 214 mg/dL — AB (ref 65–99)
Potassium: 4.4 mmol/L (ref 3.5–5.1)
Potassium: 4.1 mmol/L (ref 3.5–5.1)
Sodium: 136 mmol/L (ref 135–145)
Sodium: 135 mmol/L (ref 135–145)

## 2016-09-17 LAB — URINALYSIS, ROUTINE W REFLEX MICROSCOPIC
Bilirubin Urine: NEGATIVE
Hgb urine dipstick: NEGATIVE
LEUKOCYTES UA: NEGATIVE
NITRITE: NEGATIVE
PH: 6 (ref 5.0–8.0)
PROTEIN: 100 mg/dL — AB
Specific Gravity, Urine: 1.039 — ABNORMAL HIGH (ref 1.005–1.030)

## 2016-09-17 LAB — GLUCOSE, CAPILLARY
GLUCOSE-CAPILLARY: 171 mg/dL — AB (ref 65–99)
GLUCOSE-CAPILLARY: 182 mg/dL — AB (ref 65–99)
Glucose-Capillary: 220 mg/dL — ABNORMAL HIGH (ref 65–99)
Glucose-Capillary: 192 mg/dL — ABNORMAL HIGH (ref 65–99)

## 2016-09-17 LAB — COMPREHENSIVE METABOLIC PANEL
ALT: 20 U/L (ref 17–63)
ANION GAP: 20 — AB (ref 5–15)
AST: 22 U/L (ref 15–41)
Albumin: 5 g/dL (ref 3.5–5.0)
Alkaline Phosphatase: 82 U/L (ref 38–126)
BILIRUBIN TOTAL: 1.8 mg/dL — AB (ref 0.3–1.2)
BUN: 11 mg/dL (ref 6–20)
CHLORIDE: 99 mmol/L — AB (ref 101–111)
CO2: 17 mmol/L — ABNORMAL LOW (ref 22–32)
Calcium: 10.6 mg/dL — ABNORMAL HIGH (ref 8.9–10.3)
Creatinine, Ser: 0.91 mg/dL (ref 0.61–1.24)
Glucose, Bld: 255 mg/dL — ABNORMAL HIGH (ref 65–99)
POTASSIUM: 3.9 mmol/L (ref 3.5–5.1)
Sodium: 136 mmol/L (ref 135–145)
TOTAL PROTEIN: 8.6 g/dL — AB (ref 6.5–8.1)

## 2016-09-17 LAB — CBC WITH DIFFERENTIAL/PLATELET
BASOS ABS: 0 10*3/uL (ref 0.0–0.1)
Basophils Relative: 0 %
EOS PCT: 0 %
Eosinophils Absolute: 0 10*3/uL (ref 0.0–0.7)
HCT: 53.3 % — ABNORMAL HIGH (ref 39.0–52.0)
HEMOGLOBIN: 19.5 g/dL — AB (ref 13.0–17.0)
LYMPHS ABS: 1.4 10*3/uL (ref 0.7–4.0)
LYMPHS PCT: 8 %
MCH: 29.3 pg (ref 26.0–34.0)
MCHC: 36.6 g/dL — ABNORMAL HIGH (ref 30.0–36.0)
MCV: 80.2 fL (ref 78.0–100.0)
Monocytes Absolute: 1 10*3/uL (ref 0.1–1.0)
Monocytes Relative: 6 %
NEUTROS PCT: 86 %
Neutro Abs: 14.2 10*3/uL — ABNORMAL HIGH (ref 1.7–7.7)
PLATELETS: 253 10*3/uL (ref 150–400)
RBC: 6.65 MIL/uL — AB (ref 4.22–5.81)
RDW: 13 % (ref 11.5–15.5)
WBC: 16.6 10*3/uL — AB (ref 4.0–10.5)

## 2016-09-17 LAB — I-STAT VENOUS BLOOD GAS, ED
Acid-Base Excess: 2 mmol/L (ref 0.0–2.0)
BICARBONATE: 26 mmol/L (ref 20.0–28.0)
O2 Saturation: 30 %
PH VEN: 7.431 — AB (ref 7.250–7.430)
PO2 VEN: 19 mmHg — AB (ref 32.0–45.0)
TCO2: 27 mmol/L (ref 0–100)
pCO2, Ven: 39 mmHg — ABNORMAL LOW (ref 44.0–60.0)

## 2016-09-17 LAB — LIPASE, BLOOD: LIPASE: 21 U/L (ref 11–51)

## 2016-09-17 LAB — I-STAT CHEM 8, ED
BUN: 10 mg/dL (ref 6–20)
CALCIUM ION: 1.09 mmol/L — AB (ref 1.15–1.40)
CHLORIDE: 101 mmol/L (ref 101–111)
Creatinine, Ser: 0.6 mg/dL — ABNORMAL LOW (ref 0.61–1.24)
GLUCOSE: 196 mg/dL — AB (ref 65–99)
HCT: 53 % — ABNORMAL HIGH (ref 39.0–52.0)
Hemoglobin: 18 g/dL — ABNORMAL HIGH (ref 13.0–17.0)
Potassium: 3.7 mmol/L (ref 3.5–5.1)
Sodium: 138 mmol/L (ref 135–145)
TCO2: 21 mmol/L (ref 0–100)

## 2016-09-17 LAB — TSH: TSH: 1.858 u[IU]/mL (ref 0.350–4.500)

## 2016-09-17 LAB — URINE MICROSCOPIC-ADD ON

## 2016-09-17 LAB — CBG MONITORING, ED: Glucose-Capillary: 179 mg/dL — ABNORMAL HIGH (ref 65–99)

## 2016-09-17 LAB — BETA-HYDROXYBUTYRIC ACID: Beta-Hydroxybutyric Acid: 2.73 mmol/L — ABNORMAL HIGH (ref 0.05–0.27)

## 2016-09-17 LAB — I-STAT CG4 LACTIC ACID, ED: Lactic Acid, Venous: 1.64 mmol/L (ref 0.5–1.9)

## 2016-09-17 MED ORDER — NORTRIPTYLINE HCL 25 MG PO CAPS: 50.0000 mg | ORAL_CAPSULE | Freq: Every day | ORAL | Status: DC

## 2016-09-17 MED ORDER — MORPHINE SULFATE (PF) 4 MG/ML IV SOLN
4.0000 mg | Freq: Once | INTRAVENOUS | Status: AC
  Administered 2016-09-17: 4 mg via INTRAVENOUS
  Filled 2016-09-17: qty 1

## 2016-09-17 MED ORDER — INSULIN REGULAR HUMAN 100 UNIT/ML IJ SOLN
INTRAMUSCULAR | Status: DC
  Administered 2016-09-17: 19:00:00 via INTRAVENOUS
  Filled 2016-09-17: qty 2.5

## 2016-09-17 MED ORDER — LABETALOL HCL 200 MG PO TABS: 600.0000 mg | ORAL_TABLET | Freq: Two times a day (BID) | ORAL | Status: DC

## 2016-09-17 MED ORDER — IOPAMIDOL (ISOVUE-300) INJECTION 61%
INTRAVENOUS | Status: AC
  Administered 2016-09-17: 100 mL
  Filled 2016-09-17: qty 100

## 2016-09-17 MED ORDER — ASPIRIN EC 81 MG PO TBEC
81.0000 mg | DELAYED_RELEASE_TABLET | Freq: Every day | ORAL | Status: DC
  Administered 2016-09-18: 81 mg via ORAL
  Filled 2016-09-17: qty 1

## 2016-09-17 MED ORDER — INSULIN ASPART 100 UNIT/ML ~~LOC~~ SOLN: 0.0000 [IU] | Freq: Three times a day (TID) | SUBCUTANEOUS | Status: DC

## 2016-09-17 MED ORDER — METOCLOPRAMIDE HCL 5 MG/ML IJ SOLN
10.0000 mg | Freq: Three times a day (TID) | INTRAMUSCULAR | Status: AC
  Administered 2016-09-18 (×2): 10 mg via INTRAVENOUS
  Filled 2016-09-17 (×2): qty 2

## 2016-09-17 MED ORDER — ONDANSETRON HCL 4 MG/2ML IJ SOLN: 4.0000 mg | Freq: Four times a day (QID) | INTRAMUSCULAR | Status: DC | PRN

## 2016-09-17 MED ORDER — POTASSIUM CHLORIDE 10 MEQ/100ML IV SOLN
10.0000 meq | INTRAVENOUS | Status: AC
  Administered 2016-09-17: 10 meq via INTRAVENOUS
  Filled 2016-09-17: qty 100

## 2016-09-17 MED ORDER — SODIUM CHLORIDE 0.9 % IV BOLUS (SEPSIS)
1000.0000 mL | Freq: Once | INTRAVENOUS | Status: AC
  Administered 2016-09-17: 1000 mL via INTRAVENOUS

## 2016-09-17 MED ORDER — INSULIN ASPART 100 UNIT/ML ~~LOC~~ SOLN: 0.0000 [IU] | Freq: Every day | SUBCUTANEOUS | Status: DC

## 2016-09-17 MED ORDER — INSULIN GLARGINE 100 UNIT/ML ~~LOC~~ SOLN: 5.0000 [IU] | Freq: Every day | SUBCUTANEOUS | Status: DC

## 2016-09-17 MED ORDER — ACETAMINOPHEN 325 MG PO TABS: 650.0000 mg | ORAL_TABLET | Freq: Four times a day (QID) | ORAL | Status: DC | PRN

## 2016-09-17 MED ORDER — LABETALOL HCL 200 MG PO TABS
300.0000 mg | ORAL_TABLET | Freq: Two times a day (BID) | ORAL | Status: DC
  Administered 2016-09-17 – 2016-09-18 (×2): 300 mg via ORAL
  Filled 2016-09-17 (×2): qty 2

## 2016-09-17 MED ORDER — SODIUM CHLORIDE 0.45 % IV SOLN
INTRAVENOUS | Status: DC
Start: 2016-09-17 — End: 2016-09-17

## 2016-09-17 MED ORDER — ACETAMINOPHEN 650 MG RE SUPP: 650.0000 mg | Freq: Four times a day (QID) | RECTAL | Status: DC | PRN

## 2016-09-17 MED ORDER — ONDANSETRON HCL 4 MG/2ML IJ SOLN
4.0000 mg | Freq: Once | INTRAMUSCULAR | Status: AC
  Administered 2016-09-17: 4 mg via INTRAVENOUS
  Filled 2016-09-17: qty 2

## 2016-09-17 MED ORDER — PROMETHAZINE HCL 25 MG/ML IJ SOLN: 6.2500 mg | Freq: Three times a day (TID) | INTRAMUSCULAR | Status: DC | PRN

## 2016-09-17 MED ORDER — TRAZODONE HCL 50 MG PO TABS
100.0000 mg | ORAL_TABLET | Freq: Every day | ORAL | Status: DC
  Administered 2016-09-17: 100 mg via ORAL
  Filled 2016-09-17: qty 2

## 2016-09-17 MED ORDER — ONDANSETRON HCL 4 MG PO TABS: 4.0000 mg | ORAL_TABLET | Freq: Four times a day (QID) | ORAL | Status: DC | PRN

## 2016-09-17 MED ORDER — ENOXAPARIN SODIUM 40 MG/0.4ML ~~LOC~~ SOLN: 40.0000 mg | SUBCUTANEOUS | Status: DC

## 2016-09-17 MED ORDER — MORPHINE SULFATE (PF) 4 MG/ML IV SOLN
4.0000 mg | Freq: Once | INTRAVENOUS | Status: AC
Start: 2016-09-17 — End: 2016-09-17
  Administered 2016-09-17: 4 mg via INTRAVENOUS
  Filled 2016-09-17: qty 1

## 2016-09-17 MED ORDER — SODIUM CHLORIDE 0.9% FLUSH
3.0000 mL | Freq: Two times a day (BID) | INTRAVENOUS | Status: DC
  Administered 2016-09-18: 3 mL via INTRAVENOUS

## 2016-09-17 MED ORDER — LABETALOL HCL 5 MG/ML IV SOLN: 10.0000 mg | INTRAVENOUS | Status: DC | PRN

## 2016-09-17 MED ORDER — METOCLOPRAMIDE HCL 5 MG/ML IJ SOLN
10.0000 mg | Freq: Three times a day (TID) | INTRAMUSCULAR | Status: DC
  Administered 2016-09-17: 10 mg via INTRAVENOUS
  Filled 2016-09-17: qty 2

## 2016-09-17 MED ORDER — DEXTROSE-NACL 5-0.45 % IV SOLN
INTRAVENOUS | Status: DC
  Administered 2016-09-17: 19:00:00 via INTRAVENOUS

## 2016-09-17 MED ORDER — SODIUM CHLORIDE 0.9 % IV SOLN
INTRAVENOUS | Status: DC
  Administered 2016-09-17: 18:00:00 via INTRAVENOUS

## 2016-09-17 MED ORDER — MORPHINE SULFATE (PF) 4 MG/ML IV SOLN
1.0000 mg | INTRAVENOUS | Status: DC | PRN
  Administered 2016-09-17 – 2016-09-18 (×2): 1 mg via INTRAVENOUS
  Filled 2016-09-17 (×2): qty 1

## 2016-09-17 MED ORDER — METOCLOPRAMIDE HCL 5 MG/ML IJ SOLN
10.0000 mg | Freq: Once | INTRAMUSCULAR | Status: AC
  Administered 2016-09-17: 10 mg via INTRAVENOUS
  Filled 2016-09-17: qty 2

## 2016-09-17 NOTE — ED Triage Notes (Signed)
Patient comes in with c/o abd pain and n/v that started yesterday. Denies diarrhea and chest pain. EMS v/s 160/118, 128 HR, 98% RA, 18 RR. Hx of DM. EMS fsbs 257.

## 2016-09-17 NOTE — H&P (Signed)
History and Physical    Grant [name]:[id number] DOB: 10-03-1971 DOA: 09/17/2016  PCP: Donnajean Lopes, MD Patient coming from: home  Chief Complaint: persistent abdominal pain/nausea/vomiting  HPI: Grant [name] is a 45 y.o. male with medical history significant for diabetes, hypertension, peripheral neuropathy, chronic low back pain presents to the emergency Department chief complaint of persistent abdominal pain with persistent nausea vomiting. Initial evaluation in the emergency department reveals DKA within an ion gap of 20, tachycardia.  Information is obtained from the patient and the wife who is at the bedside. He states he was in his usual state of health until about 5 PM yesterday evening he developed sudden onset abdominal pain. He describes the pain as a constant aching located. Umbilical area. Associated symptoms include persistent nausea with nonbloody emesis. He states that he's had for 5 episodes of emesis that were undigested food progressed to greenish and then dry heaving. He denies headache dizziness syncope or near-syncope. He denies diarrhea constipation melena bright red blood per rectum. He denies dysuria hematuria frequency or urgency. He denies fever chills recent travel or sick contacts. He reports his last bowel movement was 2 days ago was normal in color and consistency. Reports "fair" control of his diabetes with his most recent A1c being 9.2.   ED Course: Emergency department he is afebrile hypertensive tachycardia not hypoxic. Be met reveals and anion gap of 20. He is provided with morphine, Zofran, Reglan, 3 L of fluid.  Review of Systems: As per HPI otherwise 10 point review of systems negative.   Ambulatory Status: Moderate independently with a steady gait no recent falls  Past Medical History:  Diagnosis Date  . Chronic low back pain 11/30/2015  . Diabetic peripheral neuropathy ([place]) 11/30/2015  . Hypertension   . Neuropathy ([place])   . Type 2 diabetes  mellitus ([place])     Past Surgical History:  Procedure Laterality Date  . back lipoma resection  03/2006  . KNEE ARTHROSCOPY  10/2008   left  . LEFT HEART CATHETERIZATION WITH CORONARY ANGIOGRAM N/A 08/11/2013   Procedure: LEFT HEART CATHETERIZATION WITH CORONARY ANGIOGRAM;  Surgeon: Pixie Casino, MD;  Location: [facility] CATH LAB;  Service: Cardiovascular;  Laterality: N/A;  . UPPER GI ENDOSCOPY  10/2007   showed gastritis    Social History   Social History  . Marital status: Married    Spouse name: N/A  . Number of children: 2  . Years of education: college   Occupational History  . disability    Social History Main Topics  . Smoking status: Current Every Day Smoker    Packs/day: 0.50    Years: 20.00  . Smokeless tobacco: Current User    Types: Chew     Comment: 5-7 cigarettes/day; chews occasionally  . Alcohol use No  . Drug use: No  . Sexual activity: Not on file   Other Topics Concern  . Not on file   Social History Narrative   Patient drinks 1 cup of caffeine daily.   Patient is right handed.     Allergies  Allergen Reactions  . Tylox [Oxycodone-Acetaminophen] Nausea And Vomiting    Family History  Problem Relation Age of Onset  . Diabetes Mother   . Hypertension Mother   . Hypertension Father   . Hyperlipidemia Father   . Brain cancer      grandparent  . Stomach cancer      grandparent  . Alcohol abuse Brother     Prior to  Admission medications   Medication Sig Start Date End Date Taking? Authorizing Provider  insulin aspart protamine- aspart (NOVOLOG MIX 70/30) (70-30) 100 UNIT/ML injection Inject 30-40 Units into the skin.   Yes Historical Provider, MD  labetalol (NORMODYNE) 200 MG tablet Take 600 mg by mouth 2 (two) times daily.   Yes Historical Provider, MD  traZODone (DESYREL) 100 MG tablet Take 100 mg by mouth at bedtime.   Yes Historical Provider, MD  aspirin EC 81 MG tablet Take 1 tablet (81 mg total) by mouth daily. 08/11/13   Brittainy M  Simmons, PA-C  insulin NPH Human (HUMULIN N,NOVOLIN N) 100 UNIT/ML injection Inject into the skin.    Historical Provider, MD  nortriptyline (PAMELOR) 25 MG capsule Take 50 mg by mouth at bedtime.    Historical Provider, MD    Physical Exam: Vitals:   09/17/16 1415 09/17/16 1430 09/17/16 1445 09/17/16 1530  BP: 99/76 99/86 112/92 101/87  Pulse: 113 120 112 114  Resp: _0 Temp:      TempSrc:      SpO2: 95% 97% 95% 97%  Weight:      Height:         General:  Appears calm Only slightly uncomfortable Eyes:  PERRL, EOMI, normal lids, iris ENT:  grossly normal hearing, lips & tongue, mucous membranes of his mouth are pink but dry Neck:  no LAD, masses or thyromegaly Cardiovascular:  Tachycardic but regular, no m/r/g. No LE edema. Pedal pulses present and palpable Respiratory:  CTA bilaterally, no w/r/r. Normal respiratory effort. Abdomen:  Nondistended. Soft bowel sounds are present but quite sluggish. Diffuse tenderness particularly in the lower left quadrant. No rebound Skin:  no rash or induration seen on limited exam Musculoskeletal:  grossly normal tone BUE/BLE, good ROM, no bony abnormality Psychiatric:  grossly normal mood and affect, speech fluent and appropriate, AOx3 Neurologic:  CN 2-12 grossly intact, moves all extremities in coordinated fashion, sensation intact  Labs on Admission: I have personally reviewed following labs and imaging studies  CBC:  Recent Labs Lab 09/17/16 1108 09/17/16 1514  WBC 16.6*  --   NEUTROABS 14.2*  --   HGB 19.5* 18.0*  HCT 53.3* 53.0*  MCV 80.2  --   PLT 253  --    Basic Metabolic Panel:  Recent Labs Lab 09/17/16 1108 09/17/16 1514  NA 136 138  K 3.9 3.7  CL 99* 101  CO2 17*  --   GLUCOSE 255* 196*  BUN 11 10  CREATININE 0.91 0.60*  CALCIUM 10.6*  --    GFR: Estimated Creatinine Clearance: 135.9 mL/min (by C-G formula based on SCr of 0.6 mg/dL (L)). Liver Function Tests:  Recent Labs Lab 09/17/16 1108  AST  22  ALT 20  ALKPHOS 82  BILITOT 1.8*  PROT 8.6*  ALBUMIN 5.0    Recent Labs Lab 09/17/16 1108  LIPASE 21   No results for input(s): AMMONIA in the last 168 hours. Coagulation Profile: No results for input(s): INR, PROTIME in the last 168 hours. Cardiac Enzymes: No results for input(s): CKTOTAL, CKMB, CKMBINDEX, TROPONINI in the last 168 hours. BNP (last 3 results) No results for input(s): PROBNP in the last 8760 hours. HbA1C: No results for input(s): HGBA1C in the last 72 hours. CBG: No results for input(s): GLUCAP in the last 168 hours. Lipid Profile: No results for input(s): CHOL, HDL, LDLCALC, TRIG, CHOLHDL, LDLDIRECT in the last 72 hours. Thyroid Function Tests: No results for input(s): TSH, T4TOTAL,  FREET4, T3FREE, THYROIDAB in the last 72 hours. Anemia Panel: No results for input(s): VITAMINB12, FOLATE, FERRITIN, TIBC, IRON, RETICCTPCT in the last 72 hours. Urine analysis:    Component Value Date/Time   COLORURINE YELLOW 09/17/2016 [place] 09/17/2016 1358   LABSPEC 1.039 (H) 09/17/2016 1358   PHURINE 6.0 09/17/2016 1358   GLUCOSEU >1000 (A) 09/17/2016 1358   HGBUR NEGATIVE 09/17/2016 1358   BILIRUBINUR NEGATIVE 09/17/2016 1358   KETONESUR >80 (A) 09/17/2016 1358   PROTEINUR 100 (A) 09/17/2016 1358   UROBILINOGEN 1.0 09/06/2008 0900   NITRITE NEGATIVE 09/17/2016 1358   LEUKOCYTESUR NEGATIVE 09/17/2016 1358    Creatinine Clearance: Estimated Creatinine Clearance: 135.9 mL/min (by C-G formula based on SCr of 0.6 mg/dL (L)).  Sepsis Labs: _0 (procalcitonin:4,lacticidven:4) )No results found for this or any previous visit (from the past 240 hour(s)).   Radiological Exams on Admission: Ct Abdomen Pelvis W Contrast  Result Date: 09/17/2016 CLINICAL DATA:  Left lower quadrant pain EXAM: CT ABDOMEN AND PELVIS WITH CONTRAST TECHNIQUE: Multidetector CT imaging of the abdomen and pelvis was performed using the standard protocol following  bolus administration of intravenous contrast. CONTRAST:  165m ISOVUE-300 IOPAMIDOL (ISOVUE-300) INJECTION 61% COMPARISON:  None. FINDINGS: Lower chest: Lung bases clear Hepatobiliary: Liver gallbladder and bile ducts normal. Pancreas: Negative Spleen: Negative Adrenals/Urinary Tract: Negative kidneys and adrenals. Negative for renal calculi. Stomach/Bowel: Stomach and duodenum normal. Negative for bowel obstruction. Normal appendix. Sigmoid diverticulosis without evidence of diverticulitis. No mass in the bowel. Vascular/Lymphatic: Mild atherosclerotic calcification in the aorta. No aneurysm. No lymphadenopathy. Reproductive: Mild prostate enlargement. Other: No free fluid.  Negative for hernia. Musculoskeletal: Negative IMPRESSION: Sigmoid diverticulosis without evidence of diverticulitis. Normal appendix No acute abnormality. Electronically Signed   By: CFranchot GalloM.D.   On: 09/17/2016 13:32    EKG:   Assessment/Plan Principal Problem:   DKA (diabetic ketoacidoses) (HTraverse Active Problems:   Diabetes mellitus, insulin dependent (IDDM), uncontrolled (HCC)   Anxiety state   Essential hypertension   ALCOHOL ABUSE, HX OF   Tachycardia   Hyperlipidemia   Chronic low back pain   Abdominal pain   Nausea and vomiting   #1. DKA. Etiology unclear. Reports compliance with medications and diet. Serum glucose 255 anion gap 20, and ketones 80, lactic acid within the limits of normal. He received 3 L of normal saline in the emergency department. Home regimen includes 70/30 and sliding scale -Admit to step down -Insulin drip -Bemet every 4 hours -Vigorous IV fluids -10 mEq of potassium IV 2 per protocol -Transition IV fluids to D5 as indicated per protocol -transition addition to subcutaneous insulin per protocol  #2. Persistent nausea and vomiting/abdominal pain. CT of the abdomen without acute abnormalities. Likely related to #1. -Nothing by mouth per protocol -Reglan as  needed -Zofran -Phenergan for breakthrough -Advance diet as able  #3. Tachycardia. Patient with a baseline tachycardia. Home medications include labetalol 600 mg 2 times daily but patient reports he only takes half that amount to "make it last longer". Heart rate 125 on presentation. Improved 113 admission. Reports he has not taken his beta blocker today. Blood pressure somewhat soft at 101/87. -Obtain an EKG -Continue IV fluids as noted above -Resume home BB at lower dose with parameters  #4. Hypertension. Blood pressure quite soft in the emergency department. Home medication labetalol as noted above -Monitor -Continue IV fluids  #5. History of EtOH abuse. Hasn't had a drink in 3 years  #6. Chronic back pain. Appears stable  at baseline.    DVT prophylaxis: lovenox  Code Status: full code  Family Communication: wife at bedside  Disposition Plan: home when ready  Consults called: none  Admission status: inpatient    Radene Gunning MD [facility]  If 7PM-7AM, please contact night-coverage [email or web address] Password [email or web address]  09/17/2016, 5:12 PM

## 2016-09-17 NOTE — ED Provider Notes (Signed)
[facility] DEPT Provider Note   CSN: [id number] Arrival date & time: 09/17/16  1058     History   Chief Complaint Chief Complaint  Patient presents with  . Abdominal Pain  . Emesis    HPI GORDEN COURTEAU is a 45 y.o. male.  The history is provided by the patient and medical records. No language interpreter was used.  Abdominal Pain   Associated symptoms include nausea and vomiting. Pertinent negatives include fever, diarrhea, constipation, dysuria and headaches.  Emesis   Associated symptoms include abdominal pain. Pertinent negatives include no chills, no cough, no diarrhea, no fever and no headaches.   TYVEON STAUDACHER is a 45 y.o. male  with a PMH of DM, HTN, chronic low back pain who presents to the Emergency Department complaining of acute onset of constant aching central abdominal pain that began around 5pm last night. Associated symptoms include nausea and non-bloody emesis. No medications taken prior to arrival for symptoms. No alleviating or aggravating factors noted. No recent travel or camping. No new foods. No sick contacts. No history of similar sxs. No abdominal surgeries. Denies fevers, chest pain, shortness of breath, blood in the stool, constipation, diarrhea. Last BM Saturday.  Hx of alcohol abuse in the past, but sober for the last three years.    Past Medical History:  Diagnosis Date  . Chronic low back pain 11/30/2015  . Diabetic peripheral neuropathy ([place]) 11/30/2015  . Hypertension   . Neuropathy ([place])   . Type 2 diabetes mellitus [facility])     Patient Active Problem List   Diagnosis Date Noted  . Acidosis 09/17/2016  . Dehydration 09/17/2016  . Abdominal pain 09/17/2016  . Diabetic peripheral neuropathy ([place]) 11/30/2015  . Chronic low back pain 11/30/2015  . Coronary artery spasm ([place]) 08/17/2013  . Hyperlipidemia 08/17/2013  . Peripheral neuropathy ([place]) 08/05/2013  . Chest pain 08/05/2013  . Fatigue 08/05/2013  . Tachycardia 08/05/2013  . DOE  (dyspnea on exertion) 08/05/2013  . Diabetes mellitus, insulin dependent (IDDM), uncontrolled ([place]) 02/09/2008  . Anxiety state 02/09/2008  . Essential hypertension 02/09/2008  . ALCOHOL ABUSE, HX OF 02/09/2008  . LIVER FUNCTION TESTS, ABNORMAL, HX OF 02/09/2008  . NEOPLASM, BENIGN, ESOPHAGUS 09/16/2007  . Gastritis 09/16/2007    Past Surgical History:  Procedure Laterality Date  . back lipoma resection  03/2006  . KNEE ARTHROSCOPY  10/2008   left  . LEFT HEART CATHETERIZATION WITH CORONARY ANGIOGRAM N/A 08/11/2013   Procedure: LEFT HEART CATHETERIZATION WITH CORONARY ANGIOGRAM;  Surgeon: Pixie Casino, MD;  Location: [facility] CATH LAB;  Service: Cardiovascular;  Laterality: N/A;  . UPPER GI ENDOSCOPY  10/2007   showed gastritis       Home Medications    Prior to Admission medications   Medication Sig Start Date End Date Taking? Authorizing Provider  insulin aspart protamine- aspart (NOVOLOG MIX 70/30) (70-30) 100 UNIT/ML injection Inject 30-40 Units into the skin.   Yes Historical Provider, MD  labetalol (NORMODYNE) 200 MG tablet Take 600 mg by mouth 2 (two) times daily.   Yes Historical Provider, MD  traZODone (DESYREL) 100 MG tablet Take 100 mg by mouth at bedtime.   Yes Historical Provider, MD  aspirin EC 81 MG tablet Take 1 tablet (81 mg total) by mouth daily. 08/11/13   Brittainy M Simmons, PA-C  insulin NPH Human (HUMULIN N,NOVOLIN N) 100 UNIT/ML injection Inject into the skin.    Historical Provider, MD  nortriptyline (PAMELOR) 25 MG capsule Take 50 mg by  mouth at bedtime.    Historical Provider, MD    Family History Family History  Problem Relation Age of Onset  . Diabetes Mother   . Hypertension Mother   . Hypertension Father   . Hyperlipidemia Father   . Brain cancer      grandparent  . Stomach cancer      grandparent  . Alcohol abuse Brother     Social History Social History  Substance Use Topics  . Smoking status: Current Every Day Smoker    Packs/day:  0.50    Years: 20.00  . Smokeless tobacco: Current User    Types: Chew     Comment: 5-7 cigarettes/day; chews occasionally  . Alcohol use No     Allergies   Tylox [oxycodone-acetaminophen]   Review of Systems Review of Systems  Constitutional: Negative for chills and fever.  HENT: Negative for congestion.   Eyes: Negative for visual disturbance.  Respiratory: Negative for cough and shortness of breath.   Cardiovascular: Negative.   Gastrointestinal: Positive for abdominal pain, nausea and vomiting. Negative for blood in stool, constipation and diarrhea.  Genitourinary: Negative for dysuria.  Musculoskeletal: Negative for back pain and neck pain.  Skin: Negative for rash.  Neurological: Negative for headaches.     Physical Exam Updated Vital Signs BP 101/87   Pulse 114   Temp 98.4 F (36.9 C) (Oral)   Resp 24   Ht 5\' 11"  (1.803 m)   Wt 95.3 kg   SpO2 97%   BMI 29.29 kg/m   Physical Exam  Constitutional: He is oriented to person, place, and time. He appears well-developed and well-nourished. No distress.  HENT:  Head: Normocephalic and atraumatic.  Cardiovascular: Normal heart sounds.   No murmur heard. Tachycardic but regular.  Pulmonary/Chest: Effort normal and breath sounds normal. No respiratory distress. He has no wheezes. He has no rales. He exhibits no tenderness.  Abdominal: Soft. Bowel sounds are normal. He exhibits no distension.  Focal area of tenderness to the left lower quadrant with involuntary guarding. No rebound tenderness. No CVA tenderness.  Musculoskeletal: He exhibits no edema.  Neurological: He is alert and oriented to person, place, and time.  Skin: Skin is warm and dry.  Nursing note and vitals reviewed.    ED Treatments / Results  Labs (all labs ordered are listed, but only abnormal results are displayed) Labs Reviewed  COMPREHENSIVE METABOLIC PANEL - Abnormal; Notable for the following:       Result Value   Chloride 99 (*)     CO2 17 (*)    Glucose, Bld 255 (*)    Calcium 10.6 (*)    Total Protein 8.6 (*)    Total Bilirubin 1.8 (*)    Anion gap 20 (*)    All other components within normal limits  CBC WITH DIFFERENTIAL/PLATELET - Abnormal; Notable for the following:    WBC 16.6 (*)    RBC 6.65 (*)    Hemoglobin 19.5 (*)    HCT 53.3 (*)    MCHC 36.6 (*)    Neutro Abs 14.2 (*)    All other components within normal limits  URINALYSIS, ROUTINE W REFLEX MICROSCOPIC (NOT AT [facility]) - Abnormal; Notable for the following:    Specific Gravity, Urine 1.039 (*)    Glucose, UA >1000 (*)    Ketones, ur >80 (*)    Protein, ur 100 (*)    All other components within normal limits  URINE MICROSCOPIC-ADD ON - Abnormal; Notable for the  following:    Squamous Epithelial / LPF 0-5 (*)    Bacteria, UA RARE (*)    Casts HYALINE CASTS (*)    All other components within normal limits  I-STAT CHEM 8, ED - Abnormal; Notable for the following:    Creatinine, Ser 0.60 (*)    Glucose, Bld 196 (*)    Calcium, Ion 1.09 (*)    Hemoglobin 18.0 (*)    HCT 53.0 (*)    All other components within normal limits  LIPASE, BLOOD  I-STAT CG4 LACTIC ACID, ED    EKG  EKG Interpretation None       Radiology Ct Abdomen Pelvis W Contrast  Result Date: 09/17/2016 CLINICAL DATA:  Left lower quadrant pain EXAM: CT ABDOMEN AND PELVIS WITH CONTRAST TECHNIQUE: Multidetector CT imaging of the abdomen and pelvis was performed using the standard protocol following bolus administration of intravenous contrast. CONTRAST:  122mL ISOVUE-300 IOPAMIDOL (ISOVUE-300) INJECTION 61% COMPARISON:  None. FINDINGS: Lower chest: Lung bases clear Hepatobiliary: Liver gallbladder and bile ducts normal. Pancreas: Negative Spleen: Negative Adrenals/Urinary Tract: Negative kidneys and adrenals. Negative for renal calculi. Stomach/Bowel: Stomach and duodenum normal. Negative for bowel obstruction. Normal appendix. Sigmoid diverticulosis without evidence of  diverticulitis. No mass in the bowel. Vascular/Lymphatic: Mild atherosclerotic calcification in the aorta. No aneurysm. No lymphadenopathy. Reproductive: Mild prostate enlargement. Other: No free fluid.  Negative for hernia. Musculoskeletal: Negative IMPRESSION: Sigmoid diverticulosis without evidence of diverticulitis. Normal appendix No acute abnormality. Electronically Signed   By: Franchot Gallo M.D.   On: 09/17/2016 13:32    Procedures Procedures (including critical care time)  Medications Ordered in ED Medications  sodium chloride 0.9 % bolus 1,000 mL (0 mLs Intravenous Stopped 09/17/16 1245)  ondansetron (ZOFRAN) injection 4 mg (4 mg Intravenous Given 09/17/16 1129)  morphine 4 MG/ML injection 4 mg (4 mg Intravenous Given 09/17/16 1130)  sodium chloride 0.9 % bolus 1,000 mL (0 mLs Intravenous Stopped 09/17/16 1442)  iopamidol (ISOVUE-300) 61 % injection (100 mLs  Contrast Given 09/17/16 1312)  morphine 4 MG/ML injection 4 mg (4 mg Intravenous Given 09/17/16 1351)  ondansetron (ZOFRAN) injection 4 mg (4 mg Intravenous Given 09/17/16 1351)  sodium chloride 0.9 % bolus 1,000 mL (1,000 mLs Intravenous New Bag/Given 09/17/16 1449)  metoCLOPramide (REGLAN) injection 10 mg (10 mg Intravenous Given 09/17/16 1448)     Initial Impression / Assessment and Plan / ED Course  I have reviewed the triage vital signs and the nursing notes.  Pertinent labs & imaging results that were available during my care of the patient were reviewed by me and considered in my medical decision making (see chart for details).  Clinical Course   MEGH PAWLIK is a 45 y.o. male who presents to ED for abdominal pain, n/v since 5pm last night. On exam, patient is afebrile and appears very uncomfortable. He is tachycardic 110s to 120s. Abdominal exam with focal area of tenderness to the left lower quadrant with involuntary guarding. Zofran, pain control, fluids. Will obtain abdominal labs and CT abdomen.  CBC shows  leukocytosis with shift and elevated H&H. CMP with glucose of 255, CO2 17, anion gap 20. Patient also has greater than 80 ketones in the urine. UA with elevated specific gravity and protein as well. Lactic acid is within normal limits.  After 3 L of fluids, patient remains tachycardic and has not been able to tolerate PO. Consulted hospitalist who will admit for further evaluation.    Final Clinical Impressions(s) / ED Diagnoses  Final diagnoses:  LLQ pain    New Prescriptions New Prescriptions   No medications on file     [place], PA-C 09/17/16 [place], MD 09/18/16 2224

## 2016-09-17 NOTE — ED Provider Notes (Signed)
The patient is a 45 year old male, history of diabetes, history of coronary artery vasospasm but no obstructive disease, no stenting and no anticoagulants. He presents to the hospital with left lower quadrant abdominal pain which started last night, it has been rather persistent throughout the evening but comes in flareup 7/10 intensity. On exam the patient has a soft abdomen but is focally tender in the left lower quadrant.  There is some guarding with palpation, there is no peritoneal signs, no tenderness over the right side of the abdomen. Heart and lung exams are normal except for mild tachycardia. The patient is having nausea vomiting and abdominal discomfort and has not had much to eat since last night.  The patient will need further evaluation with labs and a CT scan to rule out surgical causes of his pain.  Medications  potassium chloride 10 mEq in 100 mL IVPB (10 mEq Intravenous New Bag/Given 09/17/16 1854)  sodium chloride 0.9 % bolus 1,000 mL (0 mLs Intravenous Stopped 09/17/16 1245)  ondansetron (ZOFRAN) injection 4 mg (4 mg Intravenous Given 09/17/16 1129)  morphine 4 MG/ML injection 4 mg (4 mg Intravenous Given 09/17/16 1130)  sodium chloride 0.9 % bolus 1,000 mL (0 mLs Intravenous Stopped 09/17/16 1442)  iopamidol (ISOVUE-300) 61 % injection (100 mLs  Contrast Given 09/17/16 1312)  morphine 4 MG/ML injection 4 mg (4 mg Intravenous Given 09/17/16 1351)  ondansetron (ZOFRAN) injection 4 mg (4 mg Intravenous Given 09/17/16 1351)  sodium chloride 0.9 % bolus 1,000 mL (0 mLs Intravenous Stopped 09/17/16 1700)  metoCLOPramide (REGLAN) injection 10 mg (10 mg Intravenous Given 09/17/16 1448)  metoCLOPramide (REGLAN) injection 10 mg (10 mg Intravenous Given 09/18/16 1336)   Medical screening examination/treatment/procedure(s) were conducted as a shared visit with non-physician practitioner(s) and myself.  I personally evaluated the patient during the encounter.  Clinical Impression:    Final diagnoses:  LLQ pain         Noemi Chapel, MD 09/18/16 2224

## 2016-09-17 NOTE — ED Notes (Signed)
Attempted report x1. 

## 2016-09-18 DIAGNOSIS — E1310 Other specified diabetes mellitus with ketoacidosis without coma: Secondary | ICD-10-CM

## 2016-09-18 DIAGNOSIS — R112 Nausea with vomiting, unspecified: Secondary | ICD-10-CM | POA: Diagnosis not present

## 2016-09-18 DIAGNOSIS — R1084 Generalized abdominal pain: Secondary | ICD-10-CM | POA: Diagnosis not present

## 2016-09-18 DIAGNOSIS — K3184 Gastroparesis: Secondary | ICD-10-CM

## 2016-09-18 LAB — CBC
HEMATOCRIT: 45.3 % (ref 39.0–52.0)
Hemoglobin: 15.9 g/dL (ref 13.0–17.0)
MCH: 29.2 pg (ref 26.0–34.0)
MCHC: 35.1 g/dL (ref 30.0–36.0)
MCV: 83.1 fL (ref 78.0–100.0)
PLATELETS: 202 10*3/uL (ref 150–400)
RBC: 5.45 MIL/uL (ref 4.22–5.81)
RDW: 13.5 % (ref 11.5–15.5)
WBC: 12.9 10*3/uL — ABNORMAL HIGH (ref 4.0–10.5)

## 2016-09-18 LAB — BASIC METABOLIC PANEL
Anion gap: 7 (ref 5–15)
Anion gap: 10 (ref 5–15)
Anion gap: 10 (ref 5–15)
BUN: 7 mg/dL (ref 6–20)
BUN: 7 mg/dL (ref 6–20)
BUN: 7 mg/dL (ref 6–20)
CALCIUM: 8.5 mg/dL — AB (ref 8.9–10.3)
CHLORIDE: 105 mmol/L (ref 101–111)
CO2: 23 mmol/L (ref 22–32)
CO2: 20 mmol/L — ABNORMAL LOW (ref 22–32)
CO2: 26 mmol/L (ref 22–32)
CREATININE: 0.86 mg/dL (ref 0.61–1.24)
CREATININE: 0.51 mg/dL — AB (ref 0.61–1.24)
Calcium: 8.4 mg/dL — ABNORMAL LOW (ref 8.9–10.3)
Calcium: 8.5 mg/dL — ABNORMAL LOW (ref 8.9–10.3)
Chloride: 103 mmol/L (ref 101–111)
Chloride: 102 mmol/L (ref 101–111)
Creatinine, Ser: 0.7 mg/dL (ref 0.61–1.24)
GFR calc Af Amer: >60 mL/min (ref >60)
GFR calc Af Amer: >60 mL/min (ref >60)
GLUCOSE: 166 mg/dL — AB (ref 65–99)
Glucose, Bld: 164 mg/dL — ABNORMAL HIGH (ref 65–99)
Glucose, Bld: 133 mg/dL — ABNORMAL HIGH (ref 65–99)
POTASSIUM: 3.4 mmol/L — AB (ref 3.5–5.1)
Potassium: 3.7 mmol/L (ref 3.5–5.1)
Potassium: 3.7 mmol/L (ref 3.5–5.1)
SODIUM: 136 mmol/L (ref 135–145)
SODIUM: 135 mmol/L (ref 135–145)
Sodium: 135 mmol/L (ref 135–145)

## 2016-09-18 LAB — MRSA PCR SCREENING: MRSA BY PCR: NEGATIVE

## 2016-09-18 LAB — GLUCOSE, CAPILLARY
GLUCOSE-CAPILLARY: 137 mg/dL — AB (ref 65–99)
GLUCOSE-CAPILLARY: 149 mg/dL — AB (ref 65–99)
GLUCOSE-CAPILLARY: 189 mg/dL — AB (ref 65–99)
Glucose-Capillary: 149 mg/dL — ABNORMAL HIGH (ref 65–99)
Glucose-Capillary: 135 mg/dL — ABNORMAL HIGH (ref 65–99)
Glucose-Capillary: 144 mg/dL — ABNORMAL HIGH (ref 65–99)
Glucose-Capillary: 156 mg/dL — ABNORMAL HIGH (ref 65–99)
Glucose-Capillary: 173 mg/dL — ABNORMAL HIGH (ref 65–99)
Glucose-Capillary: 142 mg/dL — ABNORMAL HIGH (ref 65–99)
Glucose-Capillary: 158 mg/dL — ABNORMAL HIGH (ref 65–99)
Glucose-Capillary: 137 mg/dL — ABNORMAL HIGH (ref 65–99)

## 2016-09-18 MED ORDER — INSULIN ASPART 100 UNIT/ML ~~LOC~~ SOLN: 0.0000 [IU] | Freq: Every day | SUBCUTANEOUS | Status: DC

## 2016-09-18 MED ORDER — IBUPROFEN 100 MG/5ML PO SUSP
800.0000 mg | Freq: Three times a day (TID) | ORAL | Status: DC | PRN
  Filled 2016-09-18: qty 40

## 2016-09-18 MED ORDER — INSULIN NPH (HUMAN) (ISOPHANE) 100 UNIT/ML ~~LOC~~ SUSP
10.0000 [IU] | Freq: Two times a day (BID) | SUBCUTANEOUS | Status: DC
  Administered 2016-09-18: 10 [IU] via SUBCUTANEOUS
  Filled 2016-09-18: qty 10

## 2016-09-18 MED ORDER — PROCHLORPERAZINE EDISYLATE 5 MG/ML IJ SOLN
10.0000 mg | Freq: Four times a day (QID) | INTRAMUSCULAR | Status: DC | PRN
Start: 2016-09-18 — End: 2016-09-18
  Filled 2016-09-18: qty 2

## 2016-09-18 MED ORDER — IBUPROFEN 100 MG PO CHEW
800.0000 mg | CHEWABLE_TABLET | Freq: Three times a day (TID) | ORAL | Status: DC | PRN
  Filled 2016-09-18: qty 8

## 2016-09-18 MED ORDER — INSULIN ASPART 100 UNIT/ML ~~LOC~~ SOLN
0.0000 [IU] | Freq: Three times a day (TID) | SUBCUTANEOUS | Status: DC
  Administered 2016-09-18: 2 [IU] via SUBCUTANEOUS

## 2016-09-18 MED ORDER — TRAMADOL HCL 50 MG PO TABS: 50.0000 mg | ORAL_TABLET | Freq: Two times a day (BID) | ORAL | Status: DC | PRN

## 2016-09-18 MED ORDER — SODIUM CHLORIDE 0.9 % IV SOLN
INTRAVENOUS | Status: DC
  Administered 2016-09-18: 11:00:00 via INTRAVENOUS

## 2016-09-18 NOTE — Progress Notes (Signed)
Pt complaining of abdomen cramping and nausea. MD was paged. Upon, MD coming into pt room, pt refused to be seen and requesting to be signed out [facility]. Pt signed AMA and left with girlfriend.

## 2016-09-18 NOTE — Progress Notes (Signed)
[place] TEAM 1 - Stepdown/ICU TEAM  Grant [name]  [id number] DOB: [birth year] DOA: 09/17/2016 PCP: Donnajean Lopes, MD    Brief Narrative:  45 y.o. male with history significant of diabetes, hypertension, peripheral neuropathy, and chronic low back pain who presented to the ED c/o persistent abdominal pain with nausea and vomiting.  Evaluation in the ED revealed DKA within anion gap of 20.  He reported his most recent A1c being 9.2.  Subjective: I came to the room to assess the pt w/ the RN reporting that he was vomiting and couldn't keep anything down.  Immediately upon entering the room, before I could introduce myself, he said "just sign me out right now."  I explained that I was not going to d/c him as I wished to investigate his reported intractable nausea w/ vomiting.  He said "I haven't vomited at all - I am going home."  I note a empty lunch tray beside the bed.  I explained that he was always free to leave AMA but that I did not feel d/c home was appropriate.  He said "fine, go get my nurse, that is what I am going to do."    Assessment & Plan:  DKA - uncontrolled DM DKA resolved very quickly w/ IV insulin - transitioned off insulin gtt to NPH - appears to have been tolerating diet earlier   Intractable N/V and abdominal pain Suspicious for diabetic gastroparesis - AVOID NARCOTICS as they will only make the problem worse - consider gastric emptying scan v/s empiric trial of reglan, but pt now insisting on leaving AMA  HTN BP well controlled  Chronic low back pain  Unable to question him about this as he is focused on leaving AMA  Hx of EtOH abuse   DVT prophylaxis: lovenox  Code Status: FULL CODE Family Communication: no family present at time of exam  Disposition Plan: pt planning to leave AMA - if does not, will plan to schedule nuc med gastric emptying scan in AM   Consultants:  none  Procedures: none  Antimicrobials:  none   Objective: Blood pressure  122/89, pulse 90, temperature 98.2 F (36.8 C), temperature source Oral, resp. rate 17, height 5\' 11"  (1.803 m), weight 94.9 kg (209 lb 3.5 oz), SpO2 97 %.  Intake/Output Summary (Last 24 hours) at 09/18/16 1411 Last data filed at 09/18/16 1101  Gross per 24 hour  Intake           3293.3 ml  Output              800 ml  Net           2493.3 ml   Filed Weights   09/17/16 1103 09/17/16 1832 09/17/16 1944  Weight: 95.3 kg (210 lb) 95.3 kg (210 lb) 94.9 kg (209 lb 3.5 oz)    Examination: General: No acute respiratory distress evident - alert and oriented but agitated - remainder of exam refused as pt states he is leaving AMA  CBC:  Recent Labs Lab 09/17/16 1108 09/17/16 1514 09/18/16 0552  WBC 16.6*  --  12.9*  NEUTROABS 14.2*  --   --   HGB 19.5* 18.0* 15.9  HCT 53.3* 53.0* 45.3  MCV 80.2  --  83.1  PLT 253  --  [id number]   Basic Metabolic Panel:  Recent Labs Lab 09/17/16 1715 09/17/16 2113 09/18/16 0029 09/18/16 0552 09/18/16 1040  NA 136 135 136 135 135  K 4.4 4.1 3.7 3.4* 3.7  CL  103 101 103 102 105  CO2 20* 26 26 23  20*  GLUCOSE 176* 214* 164* 166* 133*  BUN 8 9 7 7 7   CREATININE 0.86 0.83 0.86 0.70 0.51*  CALCIUM 8.6* 8.6* 8.5* 8.4* 8.5*   GFR: Estimated Creatinine Clearance: 135.6 mL/min (by C-G formula based on SCr of 0.51 mg/dL (L)).  Liver Function Tests:  Recent Labs Lab 09/17/16 1108  AST 22  ALT 20  ALKPHOS 82  BILITOT 1.8*  PROT 8.6*  ALBUMIN 5.0    Recent Labs Lab 09/17/16 1108  LIPASE 21    HbA1C: No results found for: HGBA1C  CBG:  Recent Labs Lab 09/18/16 0748 09/18/16 0853 09/18/16 1006 09/18/16 1114 09/18/16 1332  GLUCAP 173* 142* 137* 158* 137*    Recent Results (from the past 240 hour(s))  MRSA PCR Screening     Status: None   Collection Time: 09/17/16  8:05 PM  Result Value Ref Range Status   MRSA by PCR NEGATIVE NEGATIVE Final    Comment:        The GeneXpert MRSA Assay (FDA approved for NASAL specimens only),  is one component of a comprehensive MRSA colonization surveillance program. It is not intended to diagnose MRSA infection nor to guide or monitor treatment for MRSA infections.      Scheduled Meds: . aspirin EC  81 mg Oral Daily  . insulin aspart  0-5 Units Subcutaneous QHS  . insulin aspart  0-9 Units Subcutaneous TID WC  . insulin NPH Human  10 Units Subcutaneous BID AC & HS  . labetalol  300 mg Oral BID  . sodium chloride flush  3 mL Intravenous Q12H  . traZODone  100 mg Oral QHS   Continuous Infusions: . sodium chloride 50 mL/hr at 09/18/16 1101     LOS: 1 day   Cherene Altes, MD [facility] Office  [phone] Pager - Text Page per Shea Evans as per below:  On-Call/Text Page:      [email or web address]      password [email or web address]  If 7PM-7AM, please contact night-coverage [email or web address] Password [email or web address] 09/18/2016, 2:11 PM

## 2016-09-20 NOTE — Discharge Summary (Addendum)
   PT LEFT AMA SUMMARY  BRYANN DEGENHARDT MRN - [id number] DOB - 08-24-1971  Date of Admission - 09/17/2016 Date LEFT AMA: 09/18/2016  Attending Physician:  Joette Catching T  Patient's PCP:  Donnajean Lopes, MD  Disposition: LEFT AMA  Follow-up Appts:  Not able to be arranged or discussed as pt LEFT AMA  Diagnoses at time pt LEFT AMA: DKA - uncontrolled DM Intractable N/V and abdominal pain HTN Chronic low back pain  Hx of EtOH abuse   Initial presentation: 45 y.o.malewith history significant of diabetes, hypertension, peripheral neuropathy, and chronic low back pain who presented to the ED c/o persistent abdominal pain with nausea and vomiting.  Evaluation in the ED revealed DKA within anion gap of 20.  He reported his most recent A1c being 9.2.  Hospital Course: I came to the room to assess the pt w/ the RN reporting that he was vomiting and couldn't keep anything down.  Immediately upon entering the room, before I could introduce myself, he said "just sign me out right now."  I explained that I was not going to d/c him as I wished to investigate his reported intractable nausea w/ vomiting.  He said "I haven't vomited at all - I am going home."  I noted an empty lunch tray beside the bed.  I explained that he was always free to leave AMA but that I did not feel d/c home was appropriate without further information.  He said "fine, go get my nurse, that is what I am going to do."      Medication List    Unable to be finalized as pt LEFT AMA  Day of Discharge Wt Readings from Last 3 Encounters:  09/17/16 94.9 kg (209 lb 3.5 oz)  11/30/15 96.8 kg (213 lb 8 oz)  09/14/13 103 kg (227 lb)   Temp Readings from Last 3 Encounters:  09/18/16 98.2 F (36.8 C) (Oral)  09/14/13 98.2 F (36.8 C) (Oral)  09/02/13 97.4 F (36.3 C) (Oral)   BP Readings from Last 3 Encounters:  09/18/16 122/89  11/30/15 130/90  09/14/13 (!) 158/110   Pulse Readings from Last 3 Encounters:   09/18/16 90  11/30/15 60  09/14/13 98    Physical Exam: Exam not able to be completed at time of d/c as pt LEFT AMA  9:00 AM 09/20/16  Cherene Altes, MD [facility] Office  [phone] Pager [phone]  On-Call/Text Page:      [email or web address]      password [email or web address]

## 2017-07-31 DIAGNOSIS — S92312A Displaced fracture of first metatarsal bone, left foot, initial encounter for closed fracture: Secondary | ICD-10-CM | POA: Insufficient documentation

## 2018-06-09 ENCOUNTER — Other Ambulatory Visit: Payer: Self-pay

## 2018-06-09 ENCOUNTER — Inpatient Hospital Stay (HOSPITAL_COMMUNITY)
Admission: EM | Admit: 2018-06-09 | Discharge: 2018-06-17 | DRG: 637 | Disposition: A | Discharge status: Home / Self Care | Payer: Self-pay | Attending: Internal Medicine | Admitting: Internal Medicine

## 2018-06-09 ENCOUNTER — Inpatient Hospital Stay (HOSPITAL_COMMUNITY): Payer: Self-pay

## 2018-06-09 ENCOUNTER — Encounter (HOSPITAL_COMMUNITY): Payer: Self-pay | Admitting: *Deleted

## 2018-06-09 DIAGNOSIS — F05 Delirium due to known physiological condition: Secondary | ICD-10-CM | POA: Diagnosis present

## 2018-06-09 DIAGNOSIS — M545 Low back pain: Secondary | ICD-10-CM | POA: Diagnosis present

## 2018-06-09 DIAGNOSIS — R443 Hallucinations, unspecified: Secondary | ICD-10-CM | POA: Diagnosis not present

## 2018-06-09 DIAGNOSIS — B1910 Unspecified viral hepatitis B without hepatic coma: Secondary | ICD-10-CM | POA: Diagnosis present

## 2018-06-09 DIAGNOSIS — K2210 Ulcer of esophagus without bleeding: Secondary | ICD-10-CM | POA: Diagnosis present

## 2018-06-09 DIAGNOSIS — R140 Abdominal distension (gaseous): Secondary | ICD-10-CM | POA: Diagnosis not present

## 2018-06-09 DIAGNOSIS — E1143 Type 2 diabetes mellitus with diabetic autonomic (poly)neuropathy: Secondary | ICD-10-CM | POA: Diagnosis present

## 2018-06-09 DIAGNOSIS — E1142 Type 2 diabetes mellitus with diabetic polyneuropathy: Secondary | ICD-10-CM | POA: Diagnosis present

## 2018-06-09 DIAGNOSIS — R6881 Early satiety: Secondary | ICD-10-CM | POA: Diagnosis not present

## 2018-06-09 DIAGNOSIS — D72829 Elevated white blood cell count, unspecified: Secondary | ICD-10-CM | POA: Diagnosis present

## 2018-06-09 DIAGNOSIS — R0902 Hypoxemia: Secondary | ICD-10-CM | POA: Diagnosis present

## 2018-06-09 DIAGNOSIS — E1310 Other specified diabetes mellitus with ketoacidosis without coma: Secondary | ICD-10-CM

## 2018-06-09 DIAGNOSIS — G8929 Other chronic pain: Secondary | ICD-10-CM | POA: Diagnosis present

## 2018-06-09 DIAGNOSIS — K2920 Alcoholic gastritis without bleeding: Secondary | ICD-10-CM | POA: Diagnosis present

## 2018-06-09 DIAGNOSIS — K226 Gastro-esophageal laceration-hemorrhage syndrome: Secondary | ICD-10-CM | POA: Diagnosis present

## 2018-06-09 DIAGNOSIS — E860 Dehydration: Secondary | ICD-10-CM

## 2018-06-09 DIAGNOSIS — E876 Hypokalemia: Secondary | ICD-10-CM | POA: Diagnosis not present

## 2018-06-09 DIAGNOSIS — R112 Nausea with vomiting, unspecified: Secondary | ICD-10-CM

## 2018-06-09 DIAGNOSIS — Z794 Long term (current) use of insulin: Secondary | ICD-10-CM

## 2018-06-09 DIAGNOSIS — R6510 Systemic inflammatory response syndrome (SIRS) of non-infectious origin without acute organ dysfunction: Secondary | ICD-10-CM

## 2018-06-09 DIAGNOSIS — K760 Fatty (change of) liver, not elsewhere classified: Secondary | ICD-10-CM | POA: Diagnosis present

## 2018-06-09 DIAGNOSIS — K920 Hematemesis: Secondary | ICD-10-CM | POA: Diagnosis present

## 2018-06-09 DIAGNOSIS — K7290 Hepatic failure, unspecified without coma: Secondary | ICD-10-CM | POA: Diagnosis not present

## 2018-06-09 DIAGNOSIS — Z79899 Other long term (current) drug therapy: Secondary | ICD-10-CM

## 2018-06-09 DIAGNOSIS — G9341 Metabolic encephalopathy: Secondary | ICD-10-CM | POA: Diagnosis present

## 2018-06-09 DIAGNOSIS — F10239 Alcohol dependence with withdrawal, unspecified: Secondary | ICD-10-CM | POA: Diagnosis present

## 2018-06-09 DIAGNOSIS — K210 Gastro-esophageal reflux disease with esophagitis: Secondary | ICD-10-CM | POA: Diagnosis present

## 2018-06-09 DIAGNOSIS — R0683 Snoring: Secondary | ICD-10-CM | POA: Diagnosis present

## 2018-06-09 DIAGNOSIS — F17200 Nicotine dependence, unspecified, uncomplicated: Secondary | ICD-10-CM | POA: Diagnosis present

## 2018-06-09 DIAGNOSIS — K208 Other esophagitis without bleeding: Secondary | ICD-10-CM

## 2018-06-09 DIAGNOSIS — F1021 Alcohol dependence, in remission: Secondary | ICD-10-CM

## 2018-06-09 DIAGNOSIS — I959 Hypotension, unspecified: Secondary | ICD-10-CM | POA: Diagnosis not present

## 2018-06-09 DIAGNOSIS — F329 Major depressive disorder, single episode, unspecified: Secondary | ICD-10-CM | POA: Diagnosis present

## 2018-06-09 DIAGNOSIS — K5900 Constipation, unspecified: Secondary | ICD-10-CM | POA: Diagnosis not present

## 2018-06-09 DIAGNOSIS — I2510 Atherosclerotic heart disease of native coronary artery without angina pectoris: Secondary | ICD-10-CM | POA: Diagnosis present

## 2018-06-09 DIAGNOSIS — T383X6A Underdosing of insulin and oral hypoglycemic [antidiabetic] drugs, initial encounter: Secondary | ICD-10-CM | POA: Diagnosis present

## 2018-06-09 DIAGNOSIS — R000 Tachycardia, unspecified: Secondary | ICD-10-CM | POA: Diagnosis present

## 2018-06-09 DIAGNOSIS — K3184 Gastroparesis: Secondary | ICD-10-CM | POA: Diagnosis present

## 2018-06-09 DIAGNOSIS — E1110 Type 2 diabetes mellitus with ketoacidosis without coma: Principal | ICD-10-CM | POA: Diagnosis present

## 2018-06-09 DIAGNOSIS — E785 Hyperlipidemia, unspecified: Secondary | ICD-10-CM | POA: Diagnosis present

## 2018-06-09 DIAGNOSIS — Z885 Allergy status to narcotic agent status: Secondary | ICD-10-CM

## 2018-06-09 DIAGNOSIS — E1010 Type 1 diabetes mellitus with ketoacidosis without coma: Secondary | ICD-10-CM

## 2018-06-09 DIAGNOSIS — E1065 Type 1 diabetes mellitus with hyperglycemia: Secondary | ICD-10-CM

## 2018-06-09 DIAGNOSIS — Z781 Physical restraint status: Secondary | ICD-10-CM

## 2018-06-09 DIAGNOSIS — N179 Acute kidney failure, unspecified: Secondary | ICD-10-CM | POA: Diagnosis not present

## 2018-06-09 DIAGNOSIS — Z9114 Patient's other noncompliance with medication regimen: Secondary | ICD-10-CM

## 2018-06-09 DIAGNOSIS — Z833 Family history of diabetes mellitus: Secondary | ICD-10-CM

## 2018-06-09 DIAGNOSIS — I10 Essential (primary) hypertension: Secondary | ICD-10-CM | POA: Diagnosis present

## 2018-06-09 LAB — BASIC METABOLIC PANEL
Anion gap: 26 — ABNORMAL HIGH (ref 5–15)
Anion gap: 19 — ABNORMAL HIGH (ref 5–15)
Anion gap: 18 — ABNORMAL HIGH (ref 5–15)
Anion gap: 14 (ref 5–15)
Anion gap: 14 (ref 5–15)
Anion gap: 11 (ref 5–15)
BUN: 18 mg/dL (ref 6–20)
BUN: 15 mg/dL (ref 6–20)
BUN: 14 mg/dL (ref 6–20)
BUN: 13 mg/dL (ref 6–20)
BUN: 11 mg/dL (ref 6–20)
BUN: 10 mg/dL (ref 6–20)
CALCIUM: 11.1 mg/dL — AB (ref 8.9–10.3)
CALCIUM: 9 mg/dL (ref 8.9–10.3)
CALCIUM: 8.2 mg/dL — AB (ref 8.9–10.3)
CHLORIDE: 112 mmol/L — AB (ref 98–111)
CHLORIDE: 115 mmol/L — AB (ref 98–111)
CHLORIDE: 113 mmol/L — AB (ref 98–111)
CO2: 10 mmol/L — ABNORMAL LOW (ref 22–32)
CO2: 10 mmol/L — ABNORMAL LOW (ref 22–32)
CO2: 10 mmol/L — ABNORMAL LOW (ref 22–32)
CO2: 11 mmol/L — ABNORMAL LOW (ref 22–32)
CO2: 14 mmol/L — ABNORMAL LOW (ref 22–32)
CO2: 15 mmol/L — ABNORMAL LOW (ref 22–32)
CREATININE: 1.24 mg/dL (ref 0.61–1.24)
CREATININE: 1.03 mg/dL (ref 0.61–1.24)
CREATININE: 0.92 mg/dL (ref 0.61–1.24)
CREATININE: 0.84 mg/dL (ref 0.61–1.24)
Calcium: 9.1 mg/dL (ref 8.9–10.3)
Calcium: 9 mg/dL (ref 8.9–10.3)
Calcium: 9.2 mg/dL (ref 8.9–10.3)
Chloride: 98 mmol/L (ref 98–111)
Chloride: 110 mmol/L (ref 98–111)
Chloride: 112 mmol/L — ABNORMAL HIGH (ref 98–111)
Creatinine, Ser: 1.52 mg/dL — ABNORMAL HIGH (ref 0.61–1.24)
Creatinine, Ser: 1.32 mg/dL — ABNORMAL HIGH (ref 0.61–1.24)
GFR calc Af Amer: >60 mL/min (ref >60)
GFR calc Af Amer: >60 mL/min (ref >60)
GFR, EST NON AFRICAN AMERICAN: 53 mL/min — AB (ref >60)
Glucose, Bld: 483 mg/dL — ABNORMAL HIGH (ref 70–99)
Glucose, Bld: 334 mg/dL — ABNORMAL HIGH (ref 70–99)
Glucose, Bld: 279 mg/dL — ABNORMAL HIGH (ref 70–99)
Glucose, Bld: 188 mg/dL — ABNORMAL HIGH (ref 70–99)
Glucose, Bld: 139 mg/dL — ABNORMAL HIGH (ref 70–99)
Glucose, Bld: 223 mg/dL — ABNORMAL HIGH (ref 70–99)
POTASSIUM: 3.9 mmol/L (ref 3.5–5.1)
Potassium: 4.5 mmol/L (ref 3.5–5.1)
Potassium: 4 mmol/L (ref 3.5–5.1)
Potassium: 4.1 mmol/L (ref 3.5–5.1)
Potassium: 3.8 mmol/L (ref 3.5–5.1)
Potassium: 3.5 mmol/L (ref 3.5–5.1)
SODIUM: 134 mmol/L — AB (ref 135–145)
SODIUM: 139 mmol/L (ref 135–145)
SODIUM: 140 mmol/L (ref 135–145)
SODIUM: 140 mmol/L (ref 135–145)
SODIUM: 141 mmol/L (ref 135–145)
SODIUM: 138 mmol/L (ref 135–145)

## 2018-06-09 LAB — HEMOGLOBIN A1C
HEMOGLOBIN A1C: 9.4 % — AB (ref 4.8–5.6)
Mean Plasma Glucose: 223.08 mg/dL

## 2018-06-09 LAB — URINALYSIS, ROUTINE W REFLEX MICROSCOPIC
Bacteria, UA: NONE SEEN
Bilirubin Urine: NEGATIVE
Ketones, ur: 80 mg/dL — AB
Leukocytes, UA: NEGATIVE
NITRITE: NEGATIVE
PH: 5 (ref 5.0–8.0)
Protein, ur: 30 mg/dL — AB
SPECIFIC GRAVITY, URINE: 1.023 (ref 1.005–1.030)

## 2018-06-09 LAB — RAPID URINE DRUG SCREEN, HOSP PERFORMED
Amphetamines: NOT DETECTED
BENZODIAZEPINES: POSITIVE — AB
Cocaine: NOT DETECTED
Opiates: NOT DETECTED
Tetrahydrocannabinol: POSITIVE — AB

## 2018-06-09 LAB — I-STAT CHEM 8, ED
BUN: 24 mg/dL — ABNORMAL HIGH (ref 6–20)
CALCIUM ION: 1.32 mmol/L (ref 1.15–1.40)
CHLORIDE: 104 mmol/L (ref 98–111)
Creatinine, Ser: 0.8 mg/dL (ref 0.61–1.24)
Glucose, Bld: 479 mg/dL — ABNORMAL HIGH (ref 70–99)
HCT: 59 % — ABNORMAL HIGH (ref 39.0–52.0)
HEMOGLOBIN: 20.1 g/dL — AB (ref 13.0–17.0)
Potassium: 4 mmol/L (ref 3.5–5.1)
SODIUM: 133 mmol/L — AB (ref 135–145)
TCO2: 12 mmol/L — AB (ref 22–32)

## 2018-06-09 LAB — CBG MONITORING, ED
GLUCOSE-CAPILLARY: 394 mg/dL — AB (ref 70–99)
GLUCOSE-CAPILLARY: 332 mg/dL — AB (ref 70–99)
GLUCOSE-CAPILLARY: 324 mg/dL — AB (ref 70–99)
Glucose-Capillary: 493 mg/dL — ABNORMAL HIGH (ref 70–99)

## 2018-06-09 LAB — CBC
HCT: 52.6 % — ABNORMAL HIGH (ref 39.0–52.0)
HCT: 50.9 % (ref 39.0–52.0)
HCT: 43.2 % (ref 39.0–52.0)
HEMOGLOBIN: 15.2 g/dL (ref 13.0–17.0)
Hemoglobin: 17.6 g/dL — ABNORMAL HIGH (ref 13.0–17.0)
Hemoglobin: 17.5 g/dL — ABNORMAL HIGH (ref 13.0–17.0)
MCH: 31.1 pg (ref 26.0–34.0)
MCH: 31.2 pg (ref 26.0–34.0)
MCH: 31.2 pg (ref 26.0–34.0)
MCHC: 33.5 g/dL (ref 30.0–36.0)
MCHC: 34.4 g/dL (ref 30.0–36.0)
MCHC: 35.2 g/dL (ref 30.0–36.0)
MCV: 92.9 fL (ref 78.0–100.0)
MCV: 90.7 fL (ref 78.0–100.0)
MCV: 88.7 fL (ref 78.0–100.0)
PLATELETS: 277 10*3/uL (ref 150–400)
PLATELETS: 226 10*3/uL (ref 150–400)
Platelets: 211 10*3/uL (ref 150–400)
RBC: 5.66 MIL/uL (ref 4.22–5.81)
RBC: 5.61 MIL/uL (ref 4.22–5.81)
RBC: 4.87 MIL/uL (ref 4.22–5.81)
RDW: 11.9 % (ref 11.5–15.5)
RDW: 11.9 % (ref 11.5–15.5)
RDW: 11.7 % (ref 11.5–15.5)
WBC: 24.2 10*3/uL — ABNORMAL HIGH (ref 4.0–10.5)
WBC: 25.8 10*3/uL — AB (ref 4.0–10.5)
WBC: 25.6 10*3/uL — ABNORMAL HIGH (ref 4.0–10.5)

## 2018-06-09 LAB — GLUCOSE, CAPILLARY
GLUCOSE-CAPILLARY: 209 mg/dL — AB (ref 70–99)
GLUCOSE-CAPILLARY: 187 mg/dL — AB (ref 70–99)
GLUCOSE-CAPILLARY: 164 mg/dL — AB (ref 70–99)
GLUCOSE-CAPILLARY: 164 mg/dL — AB (ref 70–99)
GLUCOSE-CAPILLARY: 156 mg/dL — AB (ref 70–99)
GLUCOSE-CAPILLARY: 205 mg/dL — AB (ref 70–99)
GLUCOSE-CAPILLARY: 204 mg/dL — AB (ref 70–99)
Glucose-Capillary: 275 mg/dL — ABNORMAL HIGH (ref 70–99)
Glucose-Capillary: 200 mg/dL — ABNORMAL HIGH (ref 70–99)
Glucose-Capillary: 130 mg/dL — ABNORMAL HIGH (ref 70–99)
Glucose-Capillary: 138 mg/dL — ABNORMAL HIGH (ref 70–99)
Glucose-Capillary: 181 mg/dL — ABNORMAL HIGH (ref 70–99)
Glucose-Capillary: 202 mg/dL — ABNORMAL HIGH (ref 70–99)
Glucose-Capillary: 213 mg/dL — ABNORMAL HIGH (ref 70–99)

## 2018-06-09 LAB — CBC WITH DIFFERENTIAL/PLATELET
Abs Immature Granulocytes: 0.3 10*3/uL — ABNORMAL HIGH (ref 0.0–0.1)
BASOS PCT: 1 %
Basophils Absolute: 0.1 10*3/uL (ref 0.0–0.1)
EOS ABS: 0 10*3/uL (ref 0.0–0.7)
EOS PCT: 0 %
HCT: 56.7 % — ABNORMAL HIGH (ref 39.0–52.0)
Hemoglobin: 19.1 g/dL — ABNORMAL HIGH (ref 13.0–17.0)
IMMATURE GRANULOCYTES: 1 %
Lymphocytes Relative: 4 %
Lymphs Abs: 1 10*3/uL (ref 0.7–4.0)
MCH: 30.8 pg (ref 26.0–34.0)
MCHC: 33.7 g/dL (ref 30.0–36.0)
MCV: 91.5 fL (ref 78.0–100.0)
MONO ABS: 1.6 10*3/uL — AB (ref 0.1–1.0)
MONOS PCT: 7 %
NEUTROS PCT: 87 %
Neutro Abs: 21.4 10*3/uL — ABNORMAL HIGH (ref 1.7–7.7)
PLATELETS: 324 10*3/uL (ref 150–400)
RBC: 6.2 MIL/uL — ABNORMAL HIGH (ref 4.22–5.81)
RDW: 11.9 % (ref 11.5–15.5)
WBC: 24.4 10*3/uL — ABNORMAL HIGH (ref 4.0–10.5)

## 2018-06-09 LAB — LACTIC ACID, PLASMA
Lactic Acid, Venous: 1 mmol/L (ref 0.5–1.9)
Lactic Acid, Venous: 1.4 mmol/L (ref 0.5–1.9)
Lactic Acid, Venous: 2 mmol/L (ref 0.5–1.9)
Lactic Acid, Venous: 0.9 mmol/L (ref 0.5–1.9)

## 2018-06-09 LAB — MRSA PCR SCREENING: MRSA by PCR: NEGATIVE

## 2018-06-09 LAB — PROCALCITONIN
PROCALCITONIN: 0.18 ng/mL
Procalcitonin: 0.23 ng/mL

## 2018-06-09 LAB — I-STAT VENOUS BLOOD GAS, ED
Acid-base deficit: 15 mmol/L — ABNORMAL HIGH (ref 0.0–2.0)
Bicarbonate: 10.9 mmol/L — ABNORMAL LOW (ref 20.0–28.0)
O2 Saturation: 58 %
PH VEN: 7.207 — AB (ref 7.250–7.430)
PO2 VEN: 36 mmHg (ref 32.0–45.0)
TCO2: 12 mmol/L — ABNORMAL LOW (ref 22–32)
pCO2, Ven: 27.5 mmHg — ABNORMAL LOW (ref 44.0–60.0)

## 2018-06-09 LAB — HIV ANTIBODY (ROUTINE TESTING W REFLEX): HIV SCREEN 4TH GENERATION: NONREACTIVE

## 2018-06-09 LAB — TROPONIN I
TROPONIN I: 0.05 ng/mL — AB (ref <0.03)
TROPONIN I: 0.04 ng/mL — AB (ref <0.03)

## 2018-06-09 LAB — POC OCCULT BLOOD, ED: FECAL OCCULT BLD: POSITIVE — AB

## 2018-06-09 LAB — PROTIME-INR
INR: 1.31
PROTHROMBIN TIME: 16.2 s — AB (ref 11.4–15.2)

## 2018-06-09 LAB — APTT: APTT: 28 s (ref 24–36)

## 2018-06-09 LAB — LIPASE, BLOOD: LIPASE: 123 U/L — AB (ref 11–51)

## 2018-06-09 MED ORDER — PANTOPRAZOLE SODIUM 40 MG IV SOLR: 40.0000 mg | Freq: Two times a day (BID) | INTRAVENOUS | Status: DC

## 2018-06-09 MED ORDER — NICOTINE 21 MG/24HR TD PT24
21.0000 mg | MEDICATED_PATCH | Freq: Every day | TRANSDERMAL | Status: DC
  Filled 2018-06-09 (×7): qty 1

## 2018-06-09 MED ORDER — SODIUM CHLORIDE 0.9 % IV SOLN
INTRAVENOUS | Status: DC
  Filled 2018-06-09: qty 1

## 2018-06-09 MED ORDER — LORAZEPAM 2 MG/ML IJ SOLN
2.0000 mg | Freq: Once | INTRAMUSCULAR | Status: AC
  Administered 2018-06-09: 2 mg via INTRAVENOUS
  Filled 2018-06-09: qty 1

## 2018-06-09 MED ORDER — SODIUM CHLORIDE 0.9 % IV SOLN
INTRAVENOUS | Status: DC
  Administered 2018-06-09: 5.8 [IU]/h via INTRAVENOUS
  Administered 2018-06-09: 3.3 [IU]/h via INTRAVENOUS
  Filled 2018-06-09 (×3): qty 1

## 2018-06-09 MED ORDER — SODIUM CHLORIDE 0.9 % IV BOLUS
1000.0000 mL | Freq: Once | INTRAVENOUS | Status: AC
  Administered 2018-06-09: 1000 mL via INTRAVENOUS

## 2018-06-09 MED ORDER — LORAZEPAM 2 MG/ML IJ SOLN
1.0000 mg | Freq: Four times a day (QID) | INTRAMUSCULAR | Status: DC | PRN
  Administered 2018-06-09: 1 mg via INTRAVENOUS
  Filled 2018-06-09: qty 1

## 2018-06-09 MED ORDER — CHLORDIAZEPOXIDE HCL 25 MG PO CAPS
50.0000 mg | ORAL_CAPSULE | Freq: Once | ORAL | Status: AC
  Administered 2018-06-09: 50 mg via ORAL
  Filled 2018-06-09: qty 2

## 2018-06-09 MED ORDER — LORAZEPAM 2 MG/ML IJ SOLN
INTRAMUSCULAR | Status: AC
  Administered 2018-06-09: 4 mg via INTRAVENOUS
  Filled 2018-06-09: qty 2

## 2018-06-09 MED ORDER — LORAZEPAM 2 MG/ML IJ SOLN
2.0000 mg | INTRAMUSCULAR | Status: DC | PRN
  Filled 2018-06-09: qty 1

## 2018-06-09 MED ORDER — SODIUM CHLORIDE 0.9 % IV SOLN
INTRAVENOUS | Status: DC
  Administered 2018-06-09: 09:00:00 via INTRAVENOUS

## 2018-06-09 MED ORDER — METOCLOPRAMIDE HCL 5 MG/ML IJ SOLN
5.0000 mg | Freq: Three times a day (TID) | INTRAMUSCULAR | Status: DC
  Administered 2018-06-09 – 2018-06-10 (×4): 5 mg via INTRAVENOUS
  Filled 2018-06-09: qty 2
  Filled 2018-06-09 (×4): qty 1

## 2018-06-09 MED ORDER — PAROXETINE HCL 10 MG PO TABS
10.0000 mg | ORAL_TABLET | Freq: Two times a day (BID) | ORAL | Status: DC
  Administered 2018-06-10 – 2018-06-17 (×15): 10 mg via ORAL
  Filled 2018-06-09 (×17): qty 1

## 2018-06-09 MED ORDER — ORAL CARE MOUTH RINSE
15.0000 mL | Freq: Two times a day (BID) | OROMUCOSAL | Status: DC
  Administered 2018-06-09 – 2018-06-15 (×5): 15 mL via OROMUCOSAL

## 2018-06-09 MED ORDER — PROMETHAZINE HCL 25 MG/ML IJ SOLN
25.0000 mg | Freq: Once | INTRAMUSCULAR | Status: AC
Start: 2018-06-09 — End: 2018-06-09
  Administered 2018-06-09: 25 mg via INTRAVENOUS
  Filled 2018-06-09: qty 1

## 2018-06-09 MED ORDER — POTASSIUM CHLORIDE 10 MEQ/100ML IV SOLN: 10.0000 meq | INTRAVENOUS | Status: DC

## 2018-06-09 MED ORDER — SODIUM CHLORIDE 0.9 % IV SOLN
8.0000 mg/h | INTRAVENOUS | Status: DC
  Administered 2018-06-09 (×2): 8 mg/h via INTRAVENOUS
  Filled 2018-06-09 (×5): qty 80

## 2018-06-09 MED ORDER — DEXTROSE-NACL 5-0.45 % IV SOLN: INTRAVENOUS | Status: DC

## 2018-06-09 MED ORDER — VITAMIN B-1 100 MG PO TABS
100.0000 mg | ORAL_TABLET | Freq: Every day | ORAL | Status: DC
  Administered 2018-06-10: 100 mg via ORAL
  Filled 2018-06-09 (×3): qty 1

## 2018-06-09 MED ORDER — LORAZEPAM 1 MG PO TABS: 2.0000 mg | ORAL_TABLET | ORAL | Status: DC | PRN

## 2018-06-09 MED ORDER — FOLIC ACID 1 MG PO TABS
1.0000 mg | ORAL_TABLET | Freq: Every day | ORAL | Status: AC
  Administered 2018-06-10 – 2018-06-11 (×2): 1 mg via ORAL
  Filled 2018-06-09 (×3): qty 1

## 2018-06-09 MED ORDER — DEXTROSE-NACL 5-0.45 % IV SOLN
INTRAVENOUS | Status: DC
  Administered 2018-06-09 – 2018-06-11 (×5): via INTRAVENOUS

## 2018-06-09 MED ORDER — HYDRALAZINE HCL 20 MG/ML IJ SOLN: 5.0000 mg | INTRAMUSCULAR | Status: DC | PRN

## 2018-06-09 MED ORDER — POTASSIUM CHLORIDE 10 MEQ/100ML IV SOLN
10.0000 meq | INTRAVENOUS | Status: AC
  Filled 2018-06-09: qty 100

## 2018-06-09 MED ORDER — LORAZEPAM 2 MG/ML IJ SOLN: 0.0000 mg | Freq: Two times a day (BID) | INTRAMUSCULAR | Status: DC

## 2018-06-09 MED ORDER — DEXMEDETOMIDINE HCL IN NACL 400 MCG/100ML IV SOLN
0.4000 ug/kg/h | INTRAVENOUS | Status: DC
  Administered 2018-06-09: 0.5 ug/kg/h via INTRAVENOUS
  Filled 2018-06-09: qty 100

## 2018-06-09 MED ORDER — LORAZEPAM 2 MG/ML IJ SOLN
4.0000 mg | Freq: Once | INTRAMUSCULAR | Status: AC
  Administered 2018-06-09: 4 mg via INTRAVENOUS

## 2018-06-09 MED ORDER — THIAMINE HCL 100 MG/ML IJ SOLN
100.0000 mg | Freq: Every day | INTRAMUSCULAR | Status: DC
  Administered 2018-06-09: 100 mg via INTRAVENOUS
  Filled 2018-06-09: qty 2

## 2018-06-09 MED ORDER — AMLODIPINE BESYLATE 5 MG PO TABS: 5.0000 mg | ORAL_TABLET | Freq: Every day | ORAL | Status: DC

## 2018-06-09 MED ORDER — LORAZEPAM 2 MG/ML IJ SOLN
4.0000 mg | Freq: Once | INTRAMUSCULAR | Status: AC
  Administered 2018-06-09: 4 mg via INTRAVENOUS
  Filled 2018-06-09: qty 2

## 2018-06-09 MED ORDER — VANCOMYCIN HCL 10 G IV SOLR
1500.0000 mg | Freq: Once | INTRAVENOUS | Status: DC
  Filled 2018-06-09: qty 1500

## 2018-06-09 MED ORDER — SODIUM CHLORIDE 0.9 % IV SOLN: INTRAVENOUS | Status: DC

## 2018-06-09 MED ORDER — ADULT MULTIVITAMIN W/MINERALS CH
1.0000 | ORAL_TABLET | Freq: Every day | ORAL | Status: DC
  Administered 2018-06-10 – 2018-06-17 (×8): 1 via ORAL
  Filled 2018-06-09 (×9): qty 1

## 2018-06-09 MED ORDER — ONDANSETRON HCL 4 MG/2ML IJ SOLN
4.0000 mg | Freq: Three times a day (TID) | INTRAMUSCULAR | Status: DC | PRN
  Administered 2018-06-09 – 2018-06-12 (×4): 4 mg via INTRAVENOUS
  Filled 2018-06-09 (×4): qty 2

## 2018-06-09 MED ORDER — PIPERACILLIN-TAZOBACTAM 3.375 G IVPB 30 MIN
3.3750 g | Freq: Once | INTRAVENOUS | Status: DC
  Filled 2018-06-09: qty 50

## 2018-06-09 MED ORDER — PROMETHAZINE HCL 25 MG/ML IJ SOLN
25.0000 mg | Freq: Four times a day (QID) | INTRAMUSCULAR | Status: DC | PRN
  Administered 2018-06-09: 25 mg via INTRAVENOUS
  Filled 2018-06-09: qty 1

## 2018-06-09 MED ORDER — METOPROLOL TARTRATE 5 MG/5ML IV SOLN
5.0000 mg | Freq: Once | INTRAVENOUS | Status: AC
  Administered 2018-06-09: 5 mg via INTRAVENOUS
  Filled 2018-06-09: qty 5

## 2018-06-09 MED ORDER — METOPROLOL SUCCINATE ER 25 MG PO TB24
25.0000 mg | ORAL_TABLET | Freq: Every day | ORAL | Status: DC
  Administered 2018-06-10 – 2018-06-17 (×8): 25 mg via ORAL
  Filled 2018-06-09 (×9): qty 1

## 2018-06-09 MED ORDER — ZOLPIDEM TARTRATE 5 MG PO TABS: 5.0000 mg | ORAL_TABLET | Freq: Every evening | ORAL | Status: DC | PRN

## 2018-06-09 MED ORDER — DEXMEDETOMIDINE HCL IN NACL 400 MCG/100ML IV SOLN
0.4000 ug/kg/h | INTRAVENOUS | Status: DC
  Administered 2018-06-09 – 2018-06-10 (×2): 0.4 ug/kg/h via INTRAVENOUS
  Filled 2018-06-09: qty 100

## 2018-06-09 MED ORDER — SODIUM CHLORIDE 0.9 % IV SOLN
80.0000 mg | Freq: Once | INTRAVENOUS | Status: AC
  Administered 2018-06-09: 80 mg via INTRAVENOUS
  Filled 2018-06-09: qty 80

## 2018-06-09 MED ORDER — LORAZEPAM 2 MG/ML IJ SOLN
0.0000 mg | Freq: Four times a day (QID) | INTRAMUSCULAR | Status: DC
  Administered 2018-06-09: 2 mg via INTRAVENOUS
  Administered 2018-06-09 (×2): 1 mg via INTRAVENOUS
  Filled 2018-06-09: qty 1
  Filled 2018-06-09: qty 2
  Filled 2018-06-09: qty 1
  Filled 2018-06-09: qty 2

## 2018-06-09 MED ORDER — LORAZEPAM 1 MG PO TABS: 1.0000 mg | ORAL_TABLET | Freq: Four times a day (QID) | ORAL | Status: DC | PRN

## 2018-06-09 NOTE — Progress Notes (Signed)
Inpatient Diabetes Program   AACE/ADA: New Consensus Statement on Inpatient Glycemic Control (2015)  Target Ranges:  Prepandial:   less than 140 mg/dL      [place] postprandial:   less than 180 mg/dL (1-2 hours)      Critically ill patients:  140 - 180 mg/dL   Spoke with patient about sick day guidelines and still taking his insulin. Patient reports he has taken his full dose of insulin in the past and had a low glucose when having N/V. Discussed with patient to follow up with his doctor to form a sick day plan for the future with reduced doses of insulin to avoid DKA in the future. Attached sick day guidelines to d/c paperwork.  Patient was complaining of Nausea when I was in the room. Spoke with RN, who was already on her way with zofran for patient.  Thanks,  Tama Headings RN, MSN, BC-ADM, [facility] Inpatient Diabetes Coordinator Team Pager [phone] (8a-5p)

## 2018-06-09 NOTE — Plan of Care (Signed)
47 year old male admitted this morning by my partner with nausea vomiting hematemesis and was found to be in DKA with a elevated anion gap of 26.  Patient being treated with IV insulin drip and IV fluids.  He is also found to have an elevated WBC count 24.4 with a high hemoglobin thought to be secondary to dehydration concentration from ongoing nausea vomiting and decreased p.o. intake.  To the drip with IV insulin ~gap is closed.  Continue IV hydration.  Will not obtain GI consult at this time as his hemoglobin is stable and he probably did have a Mallory-Weiss tear from recurrent nausea and vomiting.  Follow-up labs every 4.  When I saw him this morning he was awake alert oriented he was talking on the phone continuously to a friend of his to take care of his dog at home as his family is away on a cruise.

## 2018-06-09 NOTE — Progress Notes (Signed)
eLink Physician-Brief Progress Note Patient Name: Grant [name] DOB: August 02, 1971 MRN: [id number]   Date of Service  06/09/2018  HPI/Events of Note  Hyperglycemia - Anion gap has cleared, however, blood glucose = 213. I would prefer the blood glucose to be < 180.   eICU Interventions  Will order: 1. Decrease D5 0,45 NaCl IV to 100 mL/hour. 2. Continue insulin IV infusion for now.      Intervention Category Major Interventions: Hyperglycemia - active titration of insulin therapy  Jennavieve Arrick Eugene 06/09/2018, 11:10 PM

## 2018-06-09 NOTE — H&P (Signed)
History and Physical    Grant [name] SWN:[id number] DOB: 03/12/1971 DOA: 06/09/2018  Referring MD/NP/PA:   PCP: [name], MD   Patient coming from:  The patient is coming from home.  At baseline, pt is independent for most of ADL.      Chief Complaint: Nausea, vomiting, hematemesis  HPI: Grant [name] is a 47 y.o. male with medical history significant of HTN, HLD, DM-II, depression, peripheral neuropathy, chronic back pain, tobacco and alcohol abuse, gastroparesis, who presents with nausea vomiting, hematemesis.  Patient states that he has been having nausea, vomiting in the past 3 days, which has worsened today.  He vomited at least 5 times of coffee-ground and dark materials today.  He denies abdominal pain, but reporting abdominal bloating feeling.  Patient denies chest pain and shortness of breath, but he states that he has to take deep breath for breathing.  No cough, fever or chills.  Denies symptoms of UTI.  No unilateral weakness.  Patient states that his last drinking alcohol was 30 days ago. Pt states that he did not want to take insulin without eating.  ED Course: pt was found to have DKA with blood sugar 479, bicarbonate 10 and anion gap 26, WBC 24.4, calcium 11.1, temperature normal, tachycardia with heart rate up to 150s, tachypnea, oxygen saturation 98% on room air.  Patient is admitted to stepdown as inpatient.  Review of Systems:   General: no fevers, chills, no body weight gain, has poor appetite, has fatigue HEENT: no blurry vision, hearing changes or sore throat Respiratory: no dyspnea, coughing, wheezing CV: no chest pain, no palpitations GI: has nausea, vomiting, hematemesis, no abdominal pain, diarrhea, constipation GU: no dysuria, burning on urination, increased urinary frequency, hematuria  Ext: no leg edema Neuro: no unilateral weakness, numbness, or tingling, no vision change or hearing loss Skin: no rash, no skin tear. MSK: No muscle spasm, no  deformity, no limitation of range of movement in spin Heme: No easy bruising.  Travel history: No recent long distant travel.  Allergy:  Allergies  Allergen Reactions  . Tylox [Oxycodone-Acetaminophen] Nausea And Vomiting    Past Medical History:  Diagnosis Date  . Chronic low back pain 11/30/2015  . Diabetic peripheral neuropathy ([place]) 11/30/2015  . Hypertension   . Neuropathy ([place])   . Type 2 diabetes mellitus ([place])     Past Surgical History:  Procedure Laterality Date  . back lipoma resection  03/2006  . KNEE ARTHROSCOPY  10/2008   left  . LEFT HEART CATHETERIZATION WITH CORONARY ANGIOGRAM N/A 08/11/2013   Procedure: LEFT HEART CATHETERIZATION WITH CORONARY ANGIOGRAM;  Surgeon: Pixie Casino, MD;  Location: [facility] CATH LAB;  Service: Cardiovascular;  Laterality: N/A;  . UPPER GI ENDOSCOPY  10/2007   showed gastritis    Social History:  reports that he has been smoking.  He has a 10.00 pack-year smoking history. His smokeless tobacco use includes chew. He reports that he does not drink alcohol or use drugs.  Family History:  Family History  Problem Relation Age of Onset  . Diabetes Mother   . Hypertension Mother   . Hypertension Father   . Hyperlipidemia Father   . Alcohol abuse Brother   . Brain cancer Unknown        grandparent  . Stomach cancer Unknown        grandparent     Prior to Admission medications   Medication Sig Start Date End Date Taking? Authorizing Provider  aspirin EC 81  MG tablet Take 1 tablet (81 mg total) by mouth daily. Patient not taking: Reported on 09/17/2016 08/11/13   Lyda Jester M, PA-C  insulin aspart protamine- aspart (NOVOLOG MIX 70/30) (70-30) 100 UNIT/ML injection Inject 30-40 Units into the skin.    [provider]  insulin NPH Human (HUMULIN N,NOVOLIN N) 100 UNIT/ML injection Inject into the skin.    [provider]  labetalol (NORMODYNE) 200 MG tablet Take 600 mg by mouth 2 (two) times daily.    [provider]  nortriptyline (PAMELOR) 25 MG capsule Take 50 mg by mouth at bedtime.    [provider]  traZODone (DESYREL) 100 MG tablet Take 100 mg by mouth at bedtime.    [provider]    Physical Exam: Vitals:   06/09/18 0515 06/09/18 0600 06/09/18 0627 06/09/18 0630  BP: (!) 161/117 (!) 153/109 (!) 153/109 (!) 125/93  Pulse: (!) 145 (!) 143 (!) 150 (!) 112  Resp:  (!) 25  (!) 24  Temp:      TempSrc:      SpO2: 100% 100%  100%   General: in moderate acute distress.  Patient is tremulous. HEENT:       Eyes: PERRL, EOMI, no scleral icterus.       ENT: No discharge from the ears and nose, no pharynx injection, no tonsillar enlargement.        Neck: No JVD, no bruit, no mass felt. Heme: No neck lymph node enlargement. Cardiac: S1/S2, RRR, No murmurs, No gallops or rubs. Respiratory: No rales, wheezing, rhonchi or rubs. GI: Soft, mildly distended, nontender, no rebound pain, no organomegaly, BS present. GU: No hematuria Ext: No pitting leg edema bilaterally. 2+DP/PT pulse bilaterally. Musculoskeletal: No joint deformities, No joint redness or warmth, no limitation of ROM in spin. Skin: No rashes.  Neuro: Alert, oriented X3, cranial nerves II-XII grossly intact, moves all extremities normally. Psych: Patient is not psychotic, no suicidal or hemocidal ideation.  Labs on Admission: I have personally reviewed following labs and imaging studies  CBC: Recent Labs  Lab 06/09/18 0409 06/09/18 0443  WBC 24.4*  --   NEUTROABS 21.4*  --   HGB 19.1* 20.1*  HCT 56.7* 59.0*  MCV 91.5  --   PLT 324  --    Basic Metabolic Panel: Recent Labs  Lab 06/09/18 0409 06/09/18 0443  NA 134* 133*  K 3.9 4.0  CL 98 104  CO2 10*  --   GLUCOSE 483* 479*  BUN 18 24*  CREATININE 1.52* 0.80  CALCIUM 11.1*  --    GFR: CrCl cannot be calculated (Unknown ideal weight.). Liver Function Tests: No results for input(s): AST, ALT, ALKPHOS, BILITOT, PROT, ALBUMIN in the last  168 hours. No results for input(s): LIPASE, AMYLASE in the last 168 hours. No results for input(s): AMMONIA in the last 168 hours. Coagulation Profile: No results for input(s): INR, PROTIME in the last 168 hours. Cardiac Enzymes: No results for input(s): CKTOTAL, CKMB, CKMBINDEX, TROPONINI in the last 168 hours. BNP (last 3 results) No results for input(s): PROBNP in the last 8760 hours. HbA1C: No results for input(s): HGBA1C in the last 72 hours. CBG: Recent Labs  Lab 06/09/18 0356 06/09/18 0540  GLUCAP 493* 394*   Lipid Profile: No results for input(s): CHOL, HDL, LDLCALC, TRIG, CHOLHDL, LDLDIRECT in the last 72 hours. Thyroid Function Tests: No results for input(s): TSH, T4TOTAL, FREET4, T3FREE, THYROIDAB in the last 72 hours. Anemia Panel: No results for input(s): VITAMINB12,  FOLATE, FERRITIN, TIBC, IRON, RETICCTPCT in the last 72 hours. Urine analysis:    Component Value Date/Time   COLORURINE YELLOW 09/17/2016 [place] 09/17/2016 1358   LABSPEC 1.039 (H) 09/17/2016 1358   PHURINE 6.0 09/17/2016 1358   GLUCOSEU >1000 (A) 09/17/2016 1358   HGBUR NEGATIVE 09/17/2016 1358   BILIRUBINUR NEGATIVE 09/17/2016 1358   KETONESUR >80 (A) 09/17/2016 1358   PROTEINUR 100 (A) 09/17/2016 1358   UROBILINOGEN 1.0 09/06/2008 0900   NITRITE NEGATIVE 09/17/2016 1358   LEUKOCYTESUR NEGATIVE 09/17/2016 1358   Sepsis Labs: @LABRCNTIP (procalcitonin:4,lacticidven:4) )No results found for this or any previous visit (from the past 240 hour(s)).   Radiological Exams on Admission: No results found.   EKG: Independently reviewed.  Sinus tachycardia, QTC 424, LAD, nonspecific T wave change.  Assessment/Plan Principal Problem:   DKA (diabetic ketoacidoses) (HCC) Active Problems:   Essential hypertension   ALCOHOL ABUSE, HX OF   Dehydration   Nausea and vomiting   Hypercalcemia   SIRS (systemic inflammatory response syndrome) (HCC)   Hematemesis   Type 2 diabetes  mellitus with peripheral neuropathy (HCC)   DKA (diabetic ketoacidoses) ([place]): blood sugar 479, bicarbonate 10 and anion gap 26. This is triggered by dehydration insulin noncompliance. - Admit to stepdown  - 3L of NS bolus - start DKA protocol with BMP q4h - IVF: NS 150 cc/h; will switch to D5-1/2NS when CBG<250 - replete K as needed - Zofran prn nausea  - NPO  - consult to diabetic educator  Nausea and vomiting: likely due to gastroparesis.  Viral gastritis is also possible. -Supportive care -IV fluid as above -Scheduled Reglan and PRN Zofran -check lipase  Dehydration: Patient has hemoconcentration and hypercalcemia. - IV fluid as above  Hematemesis: Hemoglobin stable 20.1, patient has hemoconcentration secondary to dehydration. Likely due to alcoholic gastritis and Mallory-Weiss tear. - IVF: as above - Start IV pantoprazole gtt - hold ASA - Zofran IV for nausea - Avoid NSAIDs and SQ heparin - Maintain IV access (2 large bore IVs if possible). - Monitor closely and follow q6h cbc, transfuse as necessary, if Hgb<7.0 - LaB: INR, PTT and type screen  Hypercalcemia: Most likely due to dehydration.  I will hydrate patient as above and hold off work up.  If no improvement, will then do work-up for hypercalcemia. -IV fluid as above  Essential hypertension: -amlodipine and metoprolol -IV hydralazine as needed  Tobacco abuse and Alcohol abuse: Patient states that his last drinking alcohol was 30 days ago.  Patient is tremulous -Did counseling about importance of quitting smoking and drinking alcohol -Nicotine patch -CIWA protocol  SIRS (systemic inflammatory response syndrome) [facility]): Patient meets criteria for Sirs with leukocytosis, tachycardia and tachypnea.  Pending lactic acid.  No fever, possibly due to DKA and stress. -Check urinalysis -Blood culture -will get Procalcitonin and trend lactic acid levels per sepsis protocol. -IVF: as above  Type 2 diabetes mellitus with  complication of peripheral neuropathy and gastroparesis: Last A1c not on record. Seems to be poorly controled given  history of gastroparesis and DKA.  Patient is on 70/30 insulin and Humulin at home -currently on DKA protocol -Check A1c  Depression: -paxil  DVT ppx: SCD Code Status: Full code Family Communication: None at bed side.     Disposition Plan:  Anticipate discharge back to previous home environment Consults called:  none Admission status:   SDU/inpation       Date of Service 06/09/2018    Ivor Costa [facility] Pager  [phone]  If 7PM-7AM, please contact night-coverage [email or web address] Password [facility] 06/09/2018, 6:38 AM

## 2018-06-09 NOTE — Progress Notes (Signed)
Patient becoming more and more confused at this time. Patient is getting more agitated and having hallucinations. Heart rate increased from low 120's to now high 130's. Ciwa score increased quickly from 8 (which was treated at 1230) to 17. MD notified. Orders received. Will continue to monitor.

## 2018-06-09 NOTE — Consult Note (Signed)
PULMONARY / CRITICAL CARE MEDICINE   Name: Grant [name] MRN: [id number] DOB: [birth year]    ADMISSION DATE:  06/09/2018 CONSULTATION DATE:  06/09/2018  REFERRING MD:  Coralee Pesa  CHIEF COMPLAINT: Consult for agitated delirium following admission for DKA    HISTORY OF PRESENT ILLNESS: This is a 47 year old diabetic this is a 47 year old diabetic with a history of alcohol abuse hypertension and peripheral neuropathy who presented to the department of emergency medicine this morning giving a history of 3 days of nausea and vomiting with vomitus consisting of hematemesis.  He stopped taking his insulin because he had not had any p.o. intake.  He was found to be in DKA and started on a standard protocol of hydration and intravenous insulin.  Throughout the course of the day he became increasingly delirious and intermittently agitated and hallucinating.  It is not clear when he took his last drink he has been entirely inconsistent in providing any history during my visit.       Pertinent to potential provocations for DKA other than noncompliance, he denies fevers chills or cough at home he denies dysuria or abdominal pain, he denies chest pain.   PAST MEDICAL HISTORY :  He  has a past medical history of Chronic low back pain (11/30/2015), Diabetic peripheral neuropathy ([place]) (11/30/2015), Hypertension, Neuropathy, and Type 2 diabetes mellitus ([place]).  PAST SURGICAL HISTORY: He  has a past surgical history that includes back lipoma resection (03/2006); Knee arthroscopy (10/2008); Upper gi endoscopy (10/2007); and left heart catheterization with coronary angiogram (N/A, 08/11/2013).  Allergies  Allergen Reactions  . Tylox [Oxycodone-Acetaminophen] Nausea And Vomiting    No current facility-administered medications on file prior to encounter.    Current Outpatient Medications on File Prior to Encounter  Medication Sig  . amLODipine (NORVASC) 5 MG tablet Take 5 mg by mouth daily.  . insulin NPH Human  (HUMULIN N,NOVOLIN N) 100 UNIT/ML injection Inject 30-40 Units into the skin See admin instructions. Use 30 units every morning then use 40 units every evening  . insulin regular (NOVOLIN R,HUMULIN R) 100 units/mL injection Inject 4-8 Units into the skin 3 (three) times daily before meals. Sliding scale  . metoprolol succinate (TOPROL-XL) 25 MG 24 hr tablet Take 25 mg by mouth daily.  Marland Kitchen PARoxetine (PAXIL) 10 MG tablet Take 10 mg by mouth 2 (two) times daily.  Marland Kitchen aspirin EC 81 MG tablet Take 1 tablet (81 mg total) by mouth daily. (Patient not taking: Reported on 09/17/2016)    FAMILY HISTORY:  His family history includes Alcohol abuse in his brother; Brain cancer in his unknown relative; Diabetes in his mother; Hyperlipidemia in his father; Hypertension in his father and mother; Stomach cancer in his unknown relative.  SOCIAL HISTORY: He  reports that he has been smoking.  He has a 10.00 pack-year smoking history. His smokeless tobacco use includes chew. He reports that he does not drink alcohol or use drugs.  REVIEW OF SYSTEMS:   Not obtainable  SUBJECTIVE:  As above this is a very agitated middle-aged male  VITAL SIGNS: BP (!) 84/68   Pulse (!) 132   Temp 98 F (36.7 C) (Oral)   Resp (!) 27   Wt 198 lb 6.6 oz (90 kg) Comment: Per history  SpO2 98%   BMI 27.67 kg/m   HEMODYNAMICS:    VENTILATOR SETTINGS:    INTAKE / OUTPUT: I/O last 3 completed shifts: In: 2000 [IV Piggyback:2000] Out: -   PHYSICAL EXAMINATION: General: This is  a very agitated middle-aged male who does not give consistent responses to any questions. Neuro: Essentially all responses to questions are inappropriate.  Pupils are equal, EOMs are full, the face is symmetric and he moves all fours vigorously. Cardiovascular: S1 and S2 are rapid and regular without murmur rub or gallop Lungs: Respirations are unlabored, there is symmetric air movement, no wheezes Abdomen: The abdomen is soft throughout without  any overt tenderness masses guarding or rebound.  He is anicteric. Musculoskeletal: No edema   LABS:  BMET Recent Labs  Lab 06/09/18 0713 06/09/18 0854 06/09/18 1358  NA 139 140 140  K 4.5 4.0 4.1  CL 110 112* 115*  CO2 10* 10* 11*  BUN 15 14 13   CREATININE 1.32* 1.24 1.03  GLUCOSE 334* 279* 188*    Electrolytes Recent Labs  Lab 06/09/18 0713 06/09/18 0854 06/09/18 1358  CALCIUM 9.0 9.1 9.0    CBC Recent Labs  Lab 06/09/18 0409 06/09/18 0443 06/09/18 0713 06/09/18 1358  WBC 24.4*  --  24.2* 25.8*  HGB 19.1* 20.1* 17.6* 17.5*  HCT 56.7* 59.0* 52.6* 50.9  PLT 324  --  277 226    Coag's Recent Labs  Lab 06/09/18 0713  APTT 28  INR 1.31    Sepsis Markers Recent Labs  Lab 06/09/18 0713 06/09/18 0854  LATICACIDVEN 1.0 1.4  PROCALCITON 0.23  --     ABG No results for input(s): PHART, PCO2ART, PO2ART in the last 168 hours.  Liver Enzymes No results for input(s): AST, ALT, ALKPHOS, BILITOT, ALBUMIN in the last 168 hours.  Cardiac Enzymes No results for input(s): TROPONINI, PROBNP in the last 168 hours.  Glucose Recent Labs  Lab 06/09/18 0953 06/09/18 1132 06/09/18 1240 06/09/18 1350 06/09/18 1457 06/09/18 1602  GLUCAP 200* 209* 187* 164* 164* 156*    Imaging Dg Chest 1 View  Result Date: 06/09/2018 CLINICAL DATA:  Hypoxia EXAM: CHEST  1 VIEW COMPARISON:  08/06/2013 FINDINGS: Cardiac silhouette is normal in size and configuration. No mediastinal or hilar masses. No evidence of adenopathy. Prominent bronchovascular markings noted in the lung bases. No evidence of pneumonia or pulmonary edema. No pleural effusion or pneumothorax. Skeletal structures are unremarkable. IMPRESSION: No active disease. Electronically Signed   By: Lajean Manes M.D.   On: 06/09/2018 16:32     STUDIES:   CULTURES:   ANTIBIOTICS: None  SIGNIFICANT EVENTS:   LINES/TUBES:   DISCUSSION: This is a 47 year old diabetic who presented in DKA who gave a  history of hematemesis on presentation and who is now overtly delirious.  He has a history of significant alcohol intake.   ASSESSMENT / PLAN:  PULMONARY No active pulmonary issues    CARDIOVASCULAR He has a history of hypertension at baseline but currently has borderline blood pressure.  I suspect this is because he is not yet been adequately volume resuscitated and I am keeping in the back of the month my mind that we may have significant blood loss into the GI tract.  I have ordered a bolus of normal saline, held his Norvasc, and will be repeating a CBC this evening.  My suspicion of infection accounting for his hypotension is quite low despite the high white count he does not have a fever he does not have an elevated procalcitonin.  I am also checking serial enzymes.  The EKG on presentation did not show overt acute ischemia.   GASTROINTESTINAL A: Continue Protonix infusion for now, should GI bleeding become overt and he require emergent  endoscopy he is sufficiently agitated that he will require intubation and deep sedation.  HEMATOLOGIC A: Following serial hemoglobins as noted   INFECTIOUS A: As noted I do not feel compelled to look for infectious provocations for his DKA at present  ENDOCRINE A: Continue IV sliding scale insulin for correction of his DKA     NEUROLOGIC A: He is overtly delirious.  I suspect this is on the basis of alcohol withdrawal and will be giving more aggressive doses of benzodiazepines along with a Precedex infusion.  I am aware that his blood pressure is marginal I am giving a liter bolus before initiation of the Precedex.  I also very much like to make sure that we do not have a structural insult, but will need to get the patient calmer before we can get a meaningful scan.  Although he did report a history of hematemesis he does not have a significantly elevated BUN to creatinine ratio and I doubt that a heavy blood load in the GI tract is contributing to a  hepatic encephalopathy. LFTs are pending for the morning.    Lars Masson, MD Critical Care Medicine [facility] Pager: [phone]  06/09/2018, 4:57 PM

## 2018-06-09 NOTE — Progress Notes (Signed)
Patient with snoring respirations. Sats 91% on room air, patient placed on 4l [place], sats 98%. Patient will arouse to stimulation. MD called to reassess patient at this time. Sats now 96% O2 increased to 6l [place]. Will continue to monitor and await new orders.

## 2018-06-09 NOTE — ED Provider Notes (Signed)
[facility] EMERGENCY DEPARTMENT Provider Note   CSN: [id number] Arrival date & time: 06/09/18  1194     History   Chief Complaint Chief Complaint  Patient presents with  . Nausea  . Hyperglycemia    HPI Grant [name] is a 47 y.o. male.  Patient presents to the emergency department with a chief complaint of nausea and vomiting.  He also reports hyperglycemia.  He is a history of diabetes and DKA.  He is insulin controlled, but has not been taking his insulin due to vomiting and because he has not been eating anything.  He denies any fevers chills.  He reports some blood in his vomit.  Onset of symptoms was 2 to 3 days ago.  He also reports that he feels bloated.  The history is provided by the patient. No language interpreter was used.    Past Medical History:  Diagnosis Date  . Chronic low back pain 11/30/2015  . Diabetic peripheral neuropathy ([place]) 11/30/2015  . Hypertension   . Neuropathy ([place])   . Type 2 diabetes mellitus [facility])     Patient Active Problem List   Diagnosis Date Noted  . Acidosis 09/17/2016  . Dehydration 09/17/2016  . Abdominal pain 09/17/2016  . DKA (diabetic ketoacidoses) ([place]) 09/17/2016  . Nausea and vomiting 09/17/2016  . Diabetes mellitus with complication ([place])   . Gastroparesis   . Diabetic peripheral neuropathy ([place]) 11/30/2015  . Chronic low back pain 11/30/2015  . Coronary artery spasm ([place]) 08/17/2013  . Hyperlipidemia 08/17/2013  . Peripheral neuropathy 08/05/2013  . Chest pain 08/05/2013  . Fatigue 08/05/2013  . Tachycardia 08/05/2013  . DOE (dyspnea on exertion) 08/05/2013  . Diabetes mellitus, insulin dependent (IDDM), uncontrolled ([place]) 02/09/2008  . Anxiety state 02/09/2008  . Essential hypertension 02/09/2008  . ALCOHOL ABUSE, HX OF 02/09/2008  . LIVER FUNCTION TESTS, ABNORMAL, HX OF 02/09/2008  . NEOPLASM, BENIGN, ESOPHAGUS 09/16/2007  . Gastritis 09/16/2007    Past Surgical History:  Procedure  Laterality Date  . back lipoma resection  03/2006  . KNEE ARTHROSCOPY  10/2008   left  . LEFT HEART CATHETERIZATION WITH CORONARY ANGIOGRAM N/A 08/11/2013   Procedure: LEFT HEART CATHETERIZATION WITH CORONARY ANGIOGRAM;  Surgeon: Pixie Casino, MD;  Location: [facility] CATH LAB;  Service: Cardiovascular;  Laterality: N/A;  . UPPER GI ENDOSCOPY  10/2007   showed gastritis        Home Medications    Prior to Admission medications   Medication Sig Start Date End Date Taking? Authorizing Provider  aspirin EC 81 MG tablet Take 1 tablet (81 mg total) by mouth daily. Patient not taking: Reported on 09/17/2016 08/11/13   Lyda Jester M, PA-C  insulin aspart protamine- aspart (NOVOLOG MIX 70/30) (70-30) 100 UNIT/ML injection Inject 30-40 Units into the skin.    [provider]  insulin NPH Human (HUMULIN N,NOVOLIN N) 100 UNIT/ML injection Inject into the skin.    [provider]  labetalol (NORMODYNE) 200 MG tablet Take 600 mg by mouth 2 (two) times daily.    [provider]  nortriptyline (PAMELOR) 25 MG capsule Take 50 mg by mouth at bedtime.    [provider]  traZODone (DESYREL) 100 MG tablet Take 100 mg by mouth at bedtime.    [provider]    Family History Family History  Problem Relation Age of Onset  . Diabetes Mother   . Hypertension Mother   . Hypertension Father   . Hyperlipidemia Father   .  Alcohol abuse Brother   . Brain cancer Unknown        grandparent  . Stomach cancer Unknown        grandparent    Social History Social History   Tobacco Use  . Smoking status: Current Every Day Smoker    Packs/day: 0.50    Years: 20.00    Pack years: 10.00  . Smokeless tobacco: Current User    Types: Chew  . Tobacco comment: 5-7 cigarettes/day; chews occasionally  Substance Use Topics  . Alcohol use: No  . Drug use: No     Allergies   Tylox [oxycodone-acetaminophen]   Review of Systems Review of Systems  All other  systems reviewed and are negative.    Physical Exam Updated Vital Signs Pulse (!) 153   Temp 97.8 F (36.6 C) (Oral)   Resp 20   SpO2 99%   Physical Exam  Constitutional: He is oriented to person, place, and time. He appears well-developed and well-nourished.  HENT:  Head: Normocephalic and atraumatic.  Dry mucous membranes  Eyes: Pupils are equal, round, and reactive to light. Conjunctivae and EOM are normal. Right eye exhibits no discharge. Left eye exhibits no discharge. No scleral icterus.  Neck: Normal range of motion. Neck supple. No JVD present.  Cardiovascular: Regular rhythm and normal heart sounds. Exam reveals no gallop and no friction rub.  No murmur heard. Tachycardic  Pulmonary/Chest: Breath sounds normal. No respiratory distress. He has no wheezes. He has no rales. He exhibits no tenderness.  Tachypneic  Abdominal: Soft. He exhibits no distension and no mass. There is no tenderness. There is no rebound and no guarding.  Musculoskeletal: Normal range of motion. He exhibits no edema or tenderness.  Neurological: He is alert and oriented to person, place, and time.  Skin: Skin is warm and dry.  Psychiatric: He has a normal mood and affect. His behavior is normal. Judgment and thought content normal.  Nursing note and vitals reviewed.    ED Treatments / Results  Labs (all labs ordered are listed, but only abnormal results are displayed) Labs Reviewed  CBG MONITORING, ED - Abnormal; Notable for the following components:      Result Value   Glucose-Capillary 493 (*)    All other components within normal limits  POC OCCULT BLOOD, ED - Abnormal; Notable for the following components:   Fecal Occult Bld POSITIVE (*)    All other components within normal limits  CBC WITH DIFFERENTIAL/PLATELET  BASIC METABOLIC PANEL  I-STAT VENOUS BLOOD GAS, ED  POCT GASTRIC OCCULT BLOOD (1-CARD TO LAB)    EKG None  Radiology No results found.  Procedures Procedures  (including critical care time) CRITICAL CARE Performed by: Montine Circle   Total critical care time: 37 minutes  Critical care time was exclusive of separately billable procedures and treating other patients.  Critical care was necessary to treat or prevent imminent or life-threatening deterioration.  Critical care was time spent personally by me on the following activities: development of treatment plan with patient and/or surrogate as well as nursing, discussions with consultants, evaluation of patient's response to treatment, examination of patient, obtaining history from patient or surrogate, ordering and performing treatments and interventions, ordering and review of laboratory studies, ordering and review of radiographic studies, pulse oximetry and re-evaluation of patient's condition.  Medications Ordered in ED Medications  sodium chloride 0.9 % bolus 1,000 mL (1,000 mLs Intravenous Bolus from Bag 06/09/18 0430)  promethazine (PHENERGAN) injection 25 mg (25 mg  Intravenous Given 06/09/18 0427)     Initial Impression / Assessment and Plan / ED Course  I have reviewed the triage vital signs and the nursing notes.  Pertinent labs & imaging results that were available during my care of the patient were reviewed by me and considered in my medical decision making (see chart for details).    Patient with nausea, vomiting, and hyperglycemia.  Will check labs.  Patient is very dry on exam.  I am concerned about DKA in this patient.  VBG is significant for pH of 7.2.  Anion gap is 26.  K is 4.0.  We will give fluid boluses and start glucose stabilizer.  Will admit to medicine.  Appreciate Dr. Blaine Hamper for admitting the patient.   Please note that the occult blood is from vomit and not from stool.  Final Clinical Impressions(s) / ED Diagnoses   Final diagnoses:  Diabetic ketoacidosis without coma associated with type 1 diabetes mellitus [facility])    ED Discharge Orders    None         Montine Circle, PA-C 06/09/18 0520    Fatima Blank, MD 06/10/18 [phone]

## 2018-06-09 NOTE — Progress Notes (Signed)
[facility] Progress Note Patient Name: Grant [name] DOB: March 18, 1971 MRN: [id number]   Date of Service  06/09/2018  HPI/Events of Note  Request to camera into room and evaluate snoring. He is breathing with his mouth open and has a thick neck. I suspect that this represents OSA. Sat = 96% on  O2.   eICU Interventions  Will order: 1. Will need formal sleep study. 2. Rounding team will need to address in AM.      Intervention Category Major Interventions: Other:  Lysle Dingwall 06/09/2018, 10:24 PM

## 2018-06-09 NOTE — ED Notes (Signed)
IV team at bedside establishing another line

## 2018-06-09 NOTE — ED Triage Notes (Signed)
CC of nausea and vomiting. Pt diabetic, states was not able to take insulin because he has not been eating. He did not want to take insulin without eating. Pt describes emesis as coffee ground. Pt given 8mg  of Zofran and 568mL of NS by EMS with no relief.

## 2018-06-10 ENCOUNTER — Inpatient Hospital Stay (HOSPITAL_COMMUNITY): Payer: Self-pay

## 2018-06-10 DIAGNOSIS — E1110 Type 2 diabetes mellitus with ketoacidosis without coma: Principal | ICD-10-CM

## 2018-06-10 DIAGNOSIS — K920 Hematemesis: Secondary | ICD-10-CM

## 2018-06-10 DIAGNOSIS — F10231 Alcohol dependence with withdrawal delirium: Secondary | ICD-10-CM

## 2018-06-10 LAB — CBC WITH DIFFERENTIAL/PLATELET
Abs Immature Granulocytes: 0.1 10*3/uL (ref 0.0–0.1)
BASOS ABS: 0.1 10*3/uL (ref 0.0–0.1)
Basophils Relative: 0 %
EOS ABS: 0 10*3/uL (ref 0.0–0.7)
Eosinophils Relative: 0 %
HCT: 42.7 % (ref 39.0–52.0)
Hemoglobin: 14.9 g/dL (ref 13.0–17.0)
IMMATURE GRANULOCYTES: 1 %
Lymphocytes Relative: 9 %
Lymphs Abs: 1.7 10*3/uL (ref 0.7–4.0)
MCH: 30.6 pg (ref 26.0–34.0)
MCHC: 34.9 g/dL (ref 30.0–36.0)
MCV: 87.7 fL (ref 78.0–100.0)
MONOS PCT: 8 %
Monocytes Absolute: 1.5 10*3/uL — ABNORMAL HIGH (ref 0.1–1.0)
NEUTROS PCT: 82 %
Neutro Abs: 15.7 10*3/uL — ABNORMAL HIGH (ref 1.7–7.7)
Platelets: 187 10*3/uL (ref 150–400)
RBC: 4.87 MIL/uL (ref 4.22–5.81)
RDW: 11.9 % (ref 11.5–15.5)
WBC: 19 10*3/uL — AB (ref 4.0–10.5)

## 2018-06-10 LAB — GLUCOSE, CAPILLARY
GLUCOSE-CAPILLARY: 143 mg/dL — AB (ref 70–99)
GLUCOSE-CAPILLARY: 130 mg/dL — AB (ref 70–99)
GLUCOSE-CAPILLARY: 97 mg/dL (ref 70–99)
GLUCOSE-CAPILLARY: 77 mg/dL (ref 70–99)
GLUCOSE-CAPILLARY: 76 mg/dL (ref 70–99)
GLUCOSE-CAPILLARY: 86 mg/dL (ref 70–99)
GLUCOSE-CAPILLARY: 91 mg/dL (ref 70–99)
GLUCOSE-CAPILLARY: 114 mg/dL — AB (ref 70–99)
GLUCOSE-CAPILLARY: 130 mg/dL — AB (ref 70–99)
Glucose-Capillary: 100 mg/dL — ABNORMAL HIGH (ref 70–99)
Glucose-Capillary: 105 mg/dL — ABNORMAL HIGH (ref 70–99)
Glucose-Capillary: 109 mg/dL — ABNORMAL HIGH (ref 70–99)
Glucose-Capillary: 111 mg/dL — ABNORMAL HIGH (ref 70–99)
Glucose-Capillary: 127 mg/dL — ABNORMAL HIGH (ref 70–99)
Glucose-Capillary: 170 mg/dL — ABNORMAL HIGH (ref 70–99)
Glucose-Capillary: 177 mg/dL — ABNORMAL HIGH (ref 70–99)
Glucose-Capillary: 177 mg/dL — ABNORMAL HIGH (ref 70–99)
Glucose-Capillary: 203 mg/dL — ABNORMAL HIGH (ref 70–99)
Glucose-Capillary: 75 mg/dL (ref 70–99)
Glucose-Capillary: 77 mg/dL (ref 70–99)

## 2018-06-10 LAB — BASIC METABOLIC PANEL
ANION GAP: 7 (ref 5–15)
Anion gap: 7 (ref 5–15)
BUN: 12 mg/dL (ref 6–20)
BUN: 10 mg/dL (ref 6–20)
CALCIUM: 8.4 mg/dL — AB (ref 8.9–10.3)
CHLORIDE: 115 mmol/L — AB (ref 98–111)
CHLORIDE: 114 mmol/L — AB (ref 98–111)
CO2: 18 mmol/L — AB (ref 22–32)
CO2: 18 mmol/L — AB (ref 22–32)
CREATININE: 0.71 mg/dL (ref 0.61–1.24)
CREATININE: 0.67 mg/dL (ref 0.61–1.24)
Calcium: 8.2 mg/dL — ABNORMAL LOW (ref 8.9–10.3)
GFR calc non Af Amer: >60 mL/min (ref >60)
GFR calc non Af Amer: >60 mL/min (ref >60)
Glucose, Bld: 92 mg/dL (ref 70–99)
Glucose, Bld: 108 mg/dL — ABNORMAL HIGH (ref 70–99)
POTASSIUM: 2.9 mmol/L — AB (ref 3.5–5.1)
Potassium: 3 mmol/L — ABNORMAL LOW (ref 3.5–5.1)
SODIUM: 140 mmol/L (ref 135–145)
SODIUM: 139 mmol/L (ref 135–145)

## 2018-06-10 LAB — COMPREHENSIVE METABOLIC PANEL
ALT: 34 U/L (ref 0–44)
ANION GAP: 7 (ref 5–15)
AST: 34 U/L (ref 15–41)
Albumin: 3.4 g/dL — ABNORMAL LOW (ref 3.5–5.0)
Alkaline Phosphatase: 60 U/L (ref 38–126)
BILIRUBIN TOTAL: 1.3 mg/dL — AB (ref 0.3–1.2)
BUN: 10 mg/dL (ref 6–20)
CO2: 17 mmol/L — ABNORMAL LOW (ref 22–32)
Calcium: 8.5 mg/dL — ABNORMAL LOW (ref 8.9–10.3)
Chloride: 117 mmol/L — ABNORMAL HIGH (ref 98–111)
Creatinine, Ser: 0.74 mg/dL (ref 0.61–1.24)
GFR calc non Af Amer: >60 mL/min (ref >60)
Glucose, Bld: 92 mg/dL (ref 70–99)
POTASSIUM: 2.7 mmol/L — AB (ref 3.5–5.1)
Sodium: 141 mmol/L (ref 135–145)
TOTAL PROTEIN: 5.9 g/dL — AB (ref 6.5–8.1)

## 2018-06-10 LAB — PHOSPHORUS: Phosphorus: 1.9 mg/dL — ABNORMAL LOW (ref 2.5–4.6)

## 2018-06-10 LAB — CBC
HEMATOCRIT: 41.6 % (ref 39.0–52.0)
HEMOGLOBIN: 14.9 g/dL (ref 13.0–17.0)
MCH: 31.2 pg (ref 26.0–34.0)
MCHC: 35.8 g/dL (ref 30.0–36.0)
MCV: 87.2 fL (ref 78.0–100.0)
Platelets: 188 10*3/uL (ref 150–400)
RBC: 4.77 MIL/uL (ref 4.22–5.81)
RDW: 11.7 % (ref 11.5–15.5)
WBC: 20.1 10*3/uL — ABNORMAL HIGH (ref 4.0–10.5)

## 2018-06-10 LAB — PROCALCITONIN: PROCALCITONIN: 0.12 ng/mL

## 2018-06-10 LAB — MAGNESIUM: MAGNESIUM: 1.8 mg/dL (ref 1.7–2.4)

## 2018-06-10 LAB — PROTIME-INR
INR: 1.3
PROTHROMBIN TIME: 16.1 s — AB (ref 11.4–15.2)

## 2018-06-10 LAB — TROPONIN I: Troponin I: 0.13 ng/mL (ref <0.03)

## 2018-06-10 MED ORDER — PROMETHAZINE HCL 25 MG/ML IJ SOLN
12.5000 mg | Freq: Four times a day (QID) | INTRAMUSCULAR | Status: DC | PRN
Start: 2018-06-10 — End: 2018-06-17
  Administered 2018-06-11 – 2018-06-15 (×6): 12.5 mg via INTRAVENOUS
  Filled 2018-06-10 (×6): qty 1

## 2018-06-10 MED ORDER — FAMOTIDINE IN NACL 20-0.9 MG/50ML-% IV SOLN
20.0000 mg | Freq: Two times a day (BID) | INTRAVENOUS | Status: DC
  Administered 2018-06-10 (×2): 20 mg via INTRAVENOUS
  Filled 2018-06-10 (×3): qty 50

## 2018-06-10 MED ORDER — INSULIN ASPART 100 UNIT/ML ~~LOC~~ SOLN: 2.0000 [IU] | SUBCUTANEOUS | Status: DC

## 2018-06-10 MED ORDER — INSULIN DETEMIR 100 UNIT/ML ~~LOC~~ SOLN
5.0000 [IU] | Freq: Two times a day (BID) | SUBCUTANEOUS | Status: DC
  Filled 2018-06-10: qty 0.05

## 2018-06-10 MED ORDER — INSULIN ASPART 100 UNIT/ML ~~LOC~~ SOLN
0.0000 [IU] | Freq: Three times a day (TID) | SUBCUTANEOUS | Status: DC
  Administered 2018-06-11: 5 [IU] via SUBCUTANEOUS
  Administered 2018-06-11 (×2): 3 [IU] via SUBCUTANEOUS
  Administered 2018-06-12: 5 [IU] via SUBCUTANEOUS
  Administered 2018-06-12 – 2018-06-13 (×4): 3 [IU] via SUBCUTANEOUS
  Administered 2018-06-13: 2 [IU] via SUBCUTANEOUS
  Administered 2018-06-14: 3 [IU] via SUBCUTANEOUS
  Administered 2018-06-14: 2 [IU] via SUBCUTANEOUS
  Administered 2018-06-15 (×2): 5 [IU] via SUBCUTANEOUS
  Administered 2018-06-16 (×2): 3 [IU] via SUBCUTANEOUS
  Administered 2018-06-16: 5 [IU] via SUBCUTANEOUS
  Administered 2018-06-17: 3 [IU] via SUBCUTANEOUS

## 2018-06-10 MED ORDER — INSULIN ASPART 100 UNIT/ML ~~LOC~~ SOLN
0.0000 [IU] | Freq: Every day | SUBCUTANEOUS | Status: DC
  Administered 2018-06-10 – 2018-06-15 (×2): 2 [IU] via SUBCUTANEOUS

## 2018-06-10 MED ORDER — POTASSIUM PHOSPHATES 15 MMOLE/5ML IV SOLN
30.0000 mmol | Freq: Once | INTRAVENOUS | Status: AC
  Administered 2018-06-10: 30 mmol via INTRAVENOUS

## 2018-06-10 MED ORDER — POTASSIUM CHLORIDE CRYS ER 20 MEQ PO TBCR
40.0000 meq | EXTENDED_RELEASE_TABLET | Freq: Two times a day (BID) | ORAL | Status: AC
  Administered 2018-06-10 (×2): 40 meq via ORAL
  Filled 2018-06-10 (×2): qty 2

## 2018-06-10 MED ORDER — POTASSIUM CHLORIDE 20 MEQ PO PACK
40.0000 meq | PACK | ORAL | Status: DC
  Filled 2018-06-10 (×2): qty 2

## 2018-06-10 MED ORDER — POTASSIUM PHOSPHATES 15 MMOLE/5ML IV SOLN
30.0000 mmol | Freq: Once | INTRAVENOUS | Status: AC
  Administered 2018-06-10: 30 mmol via INTRAVENOUS
  Filled 2018-06-10: qty 10

## 2018-06-10 NOTE — Progress Notes (Signed)
eLink Physician-Brief Progress Note Patient Name: Grant [name] DOB: 05-27-1971 MRN: [id number]   Date of Service  06/10/2018  HPI/Events of Note  Hypophosphatemia/Hypokalemia - K+ = 2.7, PO4--- < 1.0 and Creatinine = 0.74.  eICU Interventions  Will order: 1. Replace K+ and PO4---. 2. Repeat BMP and Phosphorus level at 2 PM.      Intervention Category Major Interventions: Electrolyte abnormality - evaluation and management  Sommer,Steven Eugene 06/10/2018, 6:35 AM

## 2018-06-10 NOTE — Progress Notes (Signed)
PULMONARY / CRITICAL CARE MEDICINE   Name: Grant [name] MRN: [id number] DOB: Feb 24, 1971    ADMISSION DATE:  06/09/2018 CONSULTATION DATE:  06/09/2018  REFERRING MD:  Coralee Pesa  CHIEF COMPLAINT: Consult for agitated delirium following admission for DKA    HISTORY OF PRESENT ILLNESS: This is a 47 year old diabetic with a history of alcohol abuse hypertension and peripheral neuropathy who presented to the department of emergency medicine this morning giving a history of 3 days of nausea and vomiting with vomitus consisting of hematemesis.  He stopped taking his insulin because he had not had any p.o. intake.  He was found to be in DKA and started on a standard protocol of hydration and intravenous insulin.  Throughout the course of the day he became increasingly delirious and intermittently agitated and hallucinating.  It is not clear when he took his last drink he has been entirely inconsistent in providing any history during my visit.       Pertinent to potential provocations for DKA other than noncompliance, he denies fevers chills or cough at home he denies dysuria or abdominal pain, he denies chest pain.   SUBJECTIVE / Interval Events:  Required Precedex overnight, had snoring respirations reported.  Now weaned off Potassium and phosphorus supplemented overnight Currently on insulin 0.4 units/h He wakes to voice and interacts, told me that he had not had a drink for 30 days (inconsistent with what he reported yesterday) Currently in wrist restraints, asks politely to have them removed  VITAL SIGNS: BP 114/60   Pulse 81   Temp 98.2 F (36.8 C) (Oral)   Resp (!) 30   Wt 101.6 kg (223 lb 15.8 oz)   SpO2 99%   BMI 31.24 kg/m   HEMODYNAMICS:   He wakes to voice VENTILATOR SETTINGS:    INTAKE / OUTPUT: I/O last 3 completed shifts: In: 7219.5 [I.V.:3040.9; IV Piggyback:4178.6] Out: 1070 [Urine:1070]  PHYSICAL EXAMINATION: General: Ill-appearing man, lying in bed sleeping  deeply Neuro: Wakes to voice although somnolent and somewhat dysarthric.  He answers questions appropriately and is polite, not agitated, although unclear whether he is well oriented.  Moves all extremities Cardiovascular: Regular, no murmur Lungs: Snoring respirations while sleeping but this clears when he wakes up.  No wheezing, no crackles Abdomen: Soft, nontender, positive bowel sounds Musculoskeletal: No significant edema   LABS:  BMET Recent Labs  Lab 06/09/18 1642 06/09/18 2200 06/10/18 0442  NA 141 138 141  K 3.8 3.5 2.7*  CL 113* 112* 117*  CO2 14* 15* 17*  BUN 11 10 10   CREATININE 0.92 0.84 0.74  GLUCOSE 139* 223* 92    Electrolytes Recent Labs  Lab 06/09/18 1642 06/09/18 2200 06/10/18 0442  CALCIUM 9.2 8.2* 8.5*  MG  --   --  1.8  PHOS  --   --  <1.0*    CBC Recent Labs  Lab 06/09/18 1941 06/10/18 0231 06/10/18 0442  WBC 25.6* 20.1* 19.0*  HGB 15.2 14.9 14.9  HCT 43.2 41.6 42.7  PLT 211 188 187    Coag's Recent Labs  Lab 06/09/18 0713 06/10/18 0442  APTT 28  --   INR 1.31 1.30    Sepsis Markers Recent Labs  Lab 06/09/18 0713 06/09/18 0854 06/09/18 1642 06/09/18 1941 06/10/18 0442  LATICACIDVEN 1.0 1.4 2.0* 0.9  --   PROCALCITON 0.23  --  0.18  --  0.12    ABG No results for input(s): PHART, PCO2ART, PO2ART in the last 168 hours.  Liver  Enzymes Recent Labs  Lab 06/10/18 0442  AST 34  ALT 34  ALKPHOS 60  BILITOT 1.3*  ALBUMIN 3.4*    Cardiac Enzymes Recent Labs  Lab 06/09/18 1642 06/09/18 2200 06/10/18 0442  TROPONINI 0.05* 0.04* 0.13*    Glucose Recent Labs  Lab 06/10/18 0217 06/10/18 0314 06/10/18 0424 06/10/18 0521 06/10/18 0619 06/10/18 0717  GLUCAP 143* 130* 97 77 75 77    Imaging Dg Chest 1 View  Result Date: 06/09/2018 CLINICAL DATA:  Hypoxia EXAM: CHEST  1 VIEW COMPARISON:  08/06/2013 FINDINGS: Cardiac silhouette is normal in size and configuration. No mediastinal or hilar masses. No evidence  of adenopathy. Prominent bronchovascular markings noted in the lung bases. No evidence of pneumonia or pulmonary edema. No pleural effusion or pneumothorax. Skeletal structures are unremarkable. IMPRESSION: No active disease. Electronically Signed   By: Lajean Manes M.D.   On: 06/09/2018 16:32   Ct Head Wo Contrast  Result Date: 06/10/2018 CLINICAL DATA:  47 year old male with altered mental status. EXAM: CT HEAD WITHOUT CONTRAST TECHNIQUE: Contiguous axial images were obtained from the base of the skull through the vertex without intravenous contrast. COMPARISON:  None. FINDINGS: Brain: There is close and sulci appropriate size for patient's age. The gray-white matter discrimination is preserved. There is no acute intracranial hemorrhage. No mass effect or midline shift. No extra-axial fluid collection. Vascular: No hyperdense vessel or unexpected calcification. Skull: Normal. Negative for fracture or focal lesion. Sinuses/Orbits: The visualized paranasal sinuses and the right mastoid air cells are clear. Left mastoid effusion. Other: None IMPRESSION: 1. Unremarkable noncontrast CT of the brain. 2. Left mastoid effusions. Electronically Signed   By: Anner Crete M.D.   On: 06/10/2018 06:02     STUDIES:  Head CT 7/16 >> normal w exception L mastoid effusion  CULTURES: Blood 7/15 >>   ANTIBIOTICS: None  SIGNIFICANT EVENTS:   LINES/TUBES:   DISCUSSION: This is a 47 year old diabetic who presented in DKA who gave a history of hematemesis on presentation. Developed severe delirium and moved to ICU for precedex.  He has a history of significant alcohol intake.   ASSESSMENT / PLAN:  ENDOCRINE A: DKA. Appears to have been precipitated by stopping his insulin due to poor PO intake.  AG has closed, CO2 17. Plan continue insulin gtt until CO2 > 20, likely transition off today Follow serial BMP  NEUROLOGIC A: Delirium, likely metabolic + alcohol withdrawal. Head CT  reassuring Depression Wean precedex as able Ativan ordered based on CIWA scoring; enact once precedex weaned to off Thiamine and folate ordered Paxil ordered  RENAL Hypophosphatemia Hypokalemia  Hypomagnesemia  Replace electrolytes as indicated; received K, Phos am 7/16  PULMONARY Hx tobacco use Nocturnal hypoxemia observed while sleeping, PSG 2014 with RDI 4/hr. Continue pulm hygiene At risk OSA, ? Whether this has progressed since 2014. Certainly sedation a factor here. Nicoderm patch   CARDIOVASCULAR Hx HTN, normotensive at admission. ECG, Lactate, Pct, Hgb all reassuring Hx CAD, L Cath 2014 Hold home norvasc, continue metoprolol-XL Follow volume status, CBC given possible hemodynamically significant GIB   GASTROINTESTINAL Hematemesis at presentation, ? MW tear. At risk EtOH hepatic disease, varices, etc.  Nausea Convert PPI gtt to intermittent dosing Pepcid 7/16 Follow CBC Need outpt GI eval given risk for hepatic disease, isolated hematemesis If recurrent overt bleeding or drop in Hgb then will consult GI while admitted D/c Reglan; phenergan available prn  HEMATOLOGIC A: Hematemesis Polycythemia, ? Hemoconcentration Leukocytosis Following CBC Check INR given potential for liver  dz  INFECTIOUS A:No evidence active infectious process Follow clinically, WBC Blood cx sent 7/15  Independent CC time 40 minutes  Baltazar Apo, MD, PhD 06/10/2018, 8:34 AM [facility] [phone] or if no answer [phone]

## 2018-06-10 NOTE — Progress Notes (Signed)
Inpatient Diabetes Program Recommendations  AACE/ADA: New Consensus Statement on Inpatient Glycemic Control (2015)  Target Ranges:  Prepandial:   less than 140 mg/dL      Peak postprandial:   less than 180 mg/dL (1-2 hours)      Critically ill patients:  140 - 180 mg/dL   Results for MARSH, HECKLER (MRN [id number]) as of 06/10/2018 08:57  Ref. Range 06/10/2018 01:14 06/10/2018 02:17 06/10/2018 03:14 06/10/2018 04:24 06/10/2018 05:21 06/10/2018 06:19 06/10/2018 07:17 06/10/2018 08:29  Glucose-Capillary Latest Ref Range: 70 - 99 mg/dL 170 (H) 143 (H) 130 (H) 97 77 75 77 76    Home DM Meds: 70/30 Insulin- 30 units AM/ 40 units PM       Regular Insulin 4-8 units TID with meals per SSI  Current Insulin Orders: IV Insulin drip     Note CCM team consulted for patient due to agitated delirium following admission for DKA.  Required Precedex overnight last PM.  History of ETOH abuse.  Note that 4am BMET shows Anion Gap down to 7, however, CO2 only 17.     MD- Please wait to transition to SQ Insulin until CO2 has reached 20 mmol/L or higher.  When pt finally ready to transition to SQ Insulin, please consider transitioning to Lantus and Novolog (versus home doses of 70/30 Insulin).    Can convert back to 70/30 Insulin at time of discharge.  70/30 Insulin not ideal to use when patient has questionable PO intake.  May consider the following regimen when CO2 reaches safe level:  Lantus 40 units daily (0.4 units/kg dosing) (give at least 1 hour prior to drip being stopped) Novolog Moderate SSI (0-15 units) Q4 hours     --Will follow patient during hospitalization--  Wyn Quaker RN, MSN, CDE Diabetes Coordinator Inpatient Glycemic Control Team Team Pager: [phone] (8a-5p)

## 2018-06-10 NOTE — Progress Notes (Signed)
CRITICAL VALUE ALERT  Critical Value:  K+ 2.7 and Phosphorus <1.0  Date & Time Notied:  0613 06/10/18  Provider Notified: Warren Lacy  Orders Received/Actions taken: *see orders

## 2018-06-11 LAB — CBC
HCT: 42.7 % (ref 39.0–52.0)
Hemoglobin: 15.1 g/dL (ref 13.0–17.0)
MCH: 30.9 pg (ref 26.0–34.0)
MCHC: 35.4 g/dL (ref 30.0–36.0)
MCV: 87.3 fL (ref 78.0–100.0)
PLATELETS: 163 10*3/uL (ref 150–400)
RBC: 4.89 MIL/uL (ref 4.22–5.81)
RDW: 11.7 % (ref 11.5–15.5)
WBC: 12.8 10*3/uL — ABNORMAL HIGH (ref 4.0–10.5)

## 2018-06-11 LAB — MAGNESIUM: MAGNESIUM: 1.8 mg/dL (ref 1.7–2.4)

## 2018-06-11 LAB — GLUCOSE, CAPILLARY
GLUCOSE-CAPILLARY: 216 mg/dL — AB (ref 70–99)
Glucose-Capillary: 196 mg/dL — ABNORMAL HIGH (ref 70–99)
Glucose-Capillary: 156 mg/dL — ABNORMAL HIGH (ref 70–99)
Glucose-Capillary: 192 mg/dL — ABNORMAL HIGH (ref 70–99)

## 2018-06-11 LAB — BASIC METABOLIC PANEL
Anion gap: 11 (ref 5–15)
BUN: 6 mg/dL (ref 6–20)
CHLORIDE: 109 mmol/L (ref 98–111)
CO2: 19 mmol/L — ABNORMAL LOW (ref 22–32)
Calcium: 8.5 mg/dL — ABNORMAL LOW (ref 8.9–10.3)
Creatinine, Ser: 0.7 mg/dL (ref 0.61–1.24)
Glucose, Bld: 219 mg/dL — ABNORMAL HIGH (ref 70–99)
POTASSIUM: 3.2 mmol/L — AB (ref 3.5–5.1)
SODIUM: 139 mmol/L (ref 135–145)

## 2018-06-11 LAB — PROTIME-INR
INR: 1.2
PROTHROMBIN TIME: 15.1 s (ref 11.4–15.2)

## 2018-06-11 LAB — HEPATITIS PANEL, ACUTE
HCV Ab: <0.1 s/co ratio (ref 0.0–0.9)
Hep A IgM: NEGATIVE
Hep B C IgM: POSITIVE — AB
Hepatitis B Surface Ag: NEGATIVE

## 2018-06-11 LAB — PROCALCITONIN

## 2018-06-11 LAB — PHOSPHORUS: PHOSPHORUS: 1.2 mg/dL — AB (ref 2.5–4.6)

## 2018-06-11 MED ORDER — PNEUMOCOCCAL VAC POLYVALENT 25 MCG/0.5ML IJ INJ
0.5000 mL | INJECTION | INTRAMUSCULAR | Status: DC
  Filled 2018-06-11: qty 0.5

## 2018-06-11 MED ORDER — PANTOPRAZOLE SODIUM 40 MG PO TBEC
40.0000 mg | DELAYED_RELEASE_TABLET | Freq: Every day | ORAL | Status: DC
  Administered 2018-06-11 – 2018-06-12 (×2): 40 mg via ORAL
  Filled 2018-06-11 (×2): qty 1

## 2018-06-11 MED ORDER — MAGNESIUM SULFATE 2 GM/50ML IV SOLN
2.0000 g | Freq: Once | INTRAVENOUS | Status: AC
  Administered 2018-06-11: 2 g via INTRAVENOUS
  Filled 2018-06-11: qty 50

## 2018-06-11 MED ORDER — INSULIN GLARGINE 100 UNIT/ML ~~LOC~~ SOLN
20.0000 [IU] | Freq: Every day | SUBCUTANEOUS | Status: DC
  Administered 2018-06-11 – 2018-06-16 (×6): 20 [IU] via SUBCUTANEOUS
  Filled 2018-06-11 (×6): qty 0.2

## 2018-06-11 MED ORDER — AMLODIPINE BESYLATE 5 MG PO TABS
5.0000 mg | ORAL_TABLET | Freq: Every day | ORAL | Status: DC
  Administered 2018-06-11 – 2018-06-17 (×7): 5 mg via ORAL
  Filled 2018-06-11 (×7): qty 1

## 2018-06-11 MED ORDER — POTASSIUM PHOSPHATES 15 MMOLE/5ML IV SOLN
20.0000 mmol | Freq: Once | INTRAVENOUS | Status: AC
  Administered 2018-06-11: 20 mmol via INTRAVENOUS
  Filled 2018-06-11: qty 6.67

## 2018-06-11 MED ORDER — VITAMIN B-1 100 MG PO TABS
100.0000 mg | ORAL_TABLET | Freq: Every day | ORAL | Status: AC
  Filled 2018-06-11: qty 1

## 2018-06-11 MED ORDER — THIAMINE HCL 100 MG/ML IJ SOLN
100.0000 mg | Freq: Every day | INTRAMUSCULAR | Status: AC
  Administered 2018-06-11: 100 mg via INTRAVENOUS
  Filled 2018-06-11: qty 2

## 2018-06-11 MED ORDER — TRAZODONE HCL 100 MG PO TABS
100.0000 mg | ORAL_TABLET | Freq: Every evening | ORAL | Status: DC | PRN
  Administered 2018-06-11: 100 mg via ORAL
  Filled 2018-06-11: qty 1

## 2018-06-11 NOTE — Progress Notes (Signed)
Report called to 6N 17 RN, tx w/ vss, recvd by RN and report confirmed at bs

## 2018-06-11 NOTE — Progress Notes (Signed)
eLink Physician-Brief Progress Note Patient Name: Grant [name] DOB: Nov 26, 1971 MRN: [id number]   Date of Service  06/11/2018  HPI/Events of Note  Wants to sleep but can't  eICU Interventions  Prn trazodone qhs        [name] 06/11/2018, 9:36 PM

## 2018-06-11 NOTE — Progress Notes (Signed)
PULMONARY / CRITICAL CARE MEDICINE   Name: Grant [name] MRN: [id number] DOB: 05-Nov-1971    ADMISSION DATE:  06/09/2018 CONSULTATION DATE:  06/09/2018  REFERRING MD:  Coralee Pesa  CHIEF COMPLAINT: Consult for agitated delirium following admission for DKA    HISTORY OF PRESENT ILLNESS: This is a 47 year old diabetic with a history of alcohol abuse hypertension and peripheral neuropathy who presented to the department of emergency medicine this morning giving a history of 3 days of nausea and vomiting with vomitus consisting of hematemesis.  He stopped taking his insulin because he had not had any p.o. intake.  He was found to be in DKA and started on a standard protocol of hydration and intravenous insulin.  Throughout the course of the day he became increasingly delirious and intermittently agitated and hallucinating.  It is not clear when he took his last drink he has been entirely inconsistent in providing any history during my visit.       Pertinent to potential provocations for DKA other than noncompliance, he denies fevers chills or cough at home he denies dysuria or abdominal pain, he denies chest pain.   SUBJECTIVE / Interval Events:  Precedex is off Insulin gtt converted to SSi on 7/16 Has not taken good PO yet, has had some nausea Potassium, Mg being replaced  VITAL SIGNS: BP (!) 149/88   Pulse (!) 113   Temp 98.6 F (37 C) (Oral)   Resp (!) 28   Wt 101.6 kg (223 lb 15.8 oz)   SpO2 98%   BMI 31.24 kg/m   HEMODYNAMICS:    VENTILATOR SETTINGS:    INTAKE / OUTPUT: I/O last 3 completed shifts: In: 5614.7 [I.V.:3889.4; IV Piggyback:1725.3] Out: 1985 [JJKKX:3818]  PHYSICAL EXAMINATION: General: Sleeping, comfortable, Neuro: Wakes easily to voice, is a bit lethargic, answers questions appropriately, well oriented, moves all extremities. Cardiovascular: regular, borderline tachy Lungs: clear bilaterally Abdomen: soft, NT, + BS Musculoskeletal: no deformity     LABS:  BMET Recent Labs  Lab 06/10/18 1015 06/10/18 1409 06/11/18 0329  NA 140 139 139  K 3.0* 2.9* 3.2*  CL 115* 114* 109  CO2 18* 18* 19*  BUN 12 10 6   CREATININE 0.71 0.67 0.70  GLUCOSE 92 108* 219*    Electrolytes Recent Labs  Lab 06/10/18 0442 06/10/18 1015 06/10/18 1409 06/11/18 0329  CALCIUM 8.5* 8.4* 8.2* 8.5*  MG 1.8  --   --  1.8  PHOS <1.0*  --  1.9* 1.2*    CBC Recent Labs  Lab 06/10/18 0231 06/10/18 0442 06/11/18 0329  WBC 20.1* 19.0* 12.8*  HGB 14.9 14.9 15.1  HCT 41.6 42.7 42.7  PLT 188 187 163    Coag's Recent Labs  Lab 06/09/18 0713 06/10/18 0442 06/11/18 0329  APTT 28  --   --   INR 1.31 1.30 1.20    Sepsis Markers Recent Labs  Lab 06/09/18 0854 06/09/18 1642 06/09/18 1941 06/10/18 0442 06/11/18 0329  LATICACIDVEN 1.4 2.0* 0.9  --   --   PROCALCITON  --  0.18  --  0.12 <0.10    ABG No results for input(s): PHART, PCO2ART, PO2ART in the last 168 hours.  Liver Enzymes Recent Labs  Lab 06/10/18 0442  AST 34  ALT 34  ALKPHOS 60  BILITOT 1.3*  ALBUMIN 3.4*    Cardiac Enzymes Recent Labs  Lab 06/09/18 1642 06/09/18 2200 06/10/18 0442  TROPONINI 0.05* 0.04* 0.13*    Glucose Recent Labs  Lab 06/10/18 1524 06/10/18 1626  06/10/18 1728 06/10/18 1932 06/10/18 2150 06/11/18 0723  GLUCAP 111* 127* 130* 177* 203* 196*    Imaging No results found.   STUDIES:  Head CT 7/16 >> normal w exception L mastoid effusion  CULTURES: Blood 7/15 >>   ANTIBIOTICS: None  SIGNIFICANT EVENTS:  LINES/TUBES:   DISCUSSION: This is a 47 year old diabetic who presented in DKA who gave a history of hematemesis on presentation. Developed severe delirium and moved to ICU for precedex. States he has not had EtOh for over a month. Improved.    ASSESSMENT / PLAN:  ENDOCRINE A: DKA. Appears to have been precipitated by stopping his insulin due to poor PO intake.  Continue sliding scale insulin, Levemir apparently  never got started 7/16. lantus 20u given this am instead.  Follow BMP Transition back to his usual home insulin regimen once he is taking good p.o.  NEUROLOGIC A: Delirium, likely metabolic + alcohol withdrawal. Head CT reassuring Depression Precedex off CIWA scores have been low, has not required any ativan. Will d/c  Thiamine and folate ordered x 3 days Paxil as per home regimen  RENAL Hypophosphatemia Hypokalemia  Hypomagnesemia  K and Mg replacement   PULMONARY Hx tobacco use Nocturnal hypoxemia observed while sleeping, PSG 2014 with RDI 4/hr. Continue pulmonary hygiene At some risk for OSA although certainly sedation  was contributing on this hospitalization.  Reassuring PSG 2014.  May need to consider a repeat study as an outpatient depending on his clinical symptoms Nicotine patch Consider outpatient pulmonary function testing  CARDIOVASCULAR Hx HTN, normotensive at admission. ECG, Lactate, Pct, Hgb all reassuring Hx CAD, L Cath 2014 Continue metoprolol XL Restart home Norvasc 7/17 Follow volume status, CBC.  No evidence hemodynamic with significant GI bleed at this time   GASTROINTESTINAL Hematemesis at presentation, ? MW tear. At risk EtOH hepatic disease, varices, etc.  Nausea Change to oral PPI Follow CBC  He will need an outpatient GI referral given his risk for hepatic disease, nausea, isolated hematemesis on this admission.  If he has recurrent bleeding then we will have GI evaluate him while admitted Phenergan as needed Reglan discontinued Following CBC HEMATOLOGIC A: Hematemesis Polycythemia, ? Hemoconcentration Leukocytosis Following CBC, INR 1.2  INFECTIOUS A:No evidence active infectious process Follow culture data, WBC, clinical status  Okay to transfer to floor bed on 7/17, to [facility] as of 7/18.  Discussed with Dr. [place] Crane.   Baltazar Apo, MD, PhD 06/11/2018, 9:18 AM [facility] [phone] or if no answer [phone]

## 2018-06-12 LAB — BASIC METABOLIC PANEL
ANION GAP: 13 (ref 5–15)
BUN: 5 mg/dL — ABNORMAL LOW (ref 6–20)
CO2: 24 mmol/L (ref 22–32)
Calcium: 8.4 mg/dL — ABNORMAL LOW (ref 8.9–10.3)
Chloride: 103 mmol/L (ref 98–111)
Creatinine, Ser: 0.69 mg/dL (ref 0.61–1.24)
GFR calc non Af Amer: >60 mL/min (ref >60)
GLUCOSE: 195 mg/dL — AB (ref 70–99)
Potassium: 3 mmol/L — ABNORMAL LOW (ref 3.5–5.1)
Sodium: 140 mmol/L (ref 135–145)

## 2018-06-12 LAB — CBC
HCT: 43.7 % (ref 39.0–52.0)
Hemoglobin: 15 g/dL (ref 13.0–17.0)
MCH: 30.4 pg (ref 26.0–34.0)
MCHC: 34.3 g/dL (ref 30.0–36.0)
MCV: 88.5 fL (ref 78.0–100.0)
Platelets: 181 10*3/uL (ref 150–400)
RBC: 4.94 MIL/uL (ref 4.22–5.81)
RDW: 11.9 % (ref 11.5–15.5)
WBC: 10.6 10*3/uL — ABNORMAL HIGH (ref 4.0–10.5)

## 2018-06-12 LAB — GLUCOSE, CAPILLARY
GLUCOSE-CAPILLARY: 195 mg/dL — AB (ref 70–99)
GLUCOSE-CAPILLARY: 216 mg/dL — AB (ref 70–99)
GLUCOSE-CAPILLARY: 187 mg/dL — AB (ref 70–99)
Glucose-Capillary: 169 mg/dL — ABNORMAL HIGH (ref 70–99)

## 2018-06-12 LAB — MAGNESIUM: Magnesium: 2.1 mg/dL (ref 1.7–2.4)

## 2018-06-12 MED ORDER — ONDANSETRON HCL 4 MG/2ML IJ SOLN
4.0000 mg | INTRAMUSCULAR | Status: DC | PRN
  Administered 2018-06-13 – 2018-06-16 (×4): 4 mg via INTRAVENOUS
  Filled 2018-06-12 (×3): qty 2

## 2018-06-12 MED ORDER — POTASSIUM CHLORIDE CRYS ER 20 MEQ PO TBCR
40.0000 meq | EXTENDED_RELEASE_TABLET | ORAL | Status: DC
  Administered 2018-06-12: 40 meq via ORAL
  Filled 2018-06-12: qty 2

## 2018-06-12 MED ORDER — SODIUM CHLORIDE 0.9 % IV SOLN
INTRAVENOUS | Status: DC
  Administered 2018-06-12 – 2018-06-13 (×2): via INTRAVENOUS

## 2018-06-12 MED ORDER — SUCRALFATE 1 GM/10ML PO SUSP
1.0000 g | Freq: Three times a day (TID) | ORAL | Status: DC
  Administered 2018-06-12 – 2018-06-17 (×18): 1 g via ORAL
  Filled 2018-06-12 (×18): qty 10

## 2018-06-12 MED ORDER — ACETAMINOPHEN 325 MG PO TABS
650.0000 mg | ORAL_TABLET | Freq: Four times a day (QID) | ORAL | Status: DC | PRN
Start: 2018-06-12 — End: 2018-06-17

## 2018-06-12 MED ORDER — POTASSIUM CHLORIDE 10 MEQ/100ML IV SOLN
10.0000 meq | INTRAVENOUS | Status: AC
Start: 2018-06-12 — End: 2018-06-12
  Administered 2018-06-12 (×2): 10 meq via INTRAVENOUS
  Filled 2018-06-12: qty 100

## 2018-06-12 MED ORDER — FAMOTIDINE IN NACL 20-0.9 MG/50ML-% IV SOLN
20.0000 mg | Freq: Two times a day (BID) | INTRAVENOUS | Status: DC
Start: 2018-06-12 — End: 2018-06-13
  Administered 2018-06-12 – 2018-06-13 (×3): 20 mg via INTRAVENOUS
  Filled 2018-06-12 (×3): qty 50

## 2018-06-12 MED ORDER — MORPHINE SULFATE (PF) 2 MG/ML IV SOLN
1.0000 mg | INTRAVENOUS | Status: DC | PRN
  Administered 2018-06-12 – 2018-06-16 (×13): 1 mg via INTRAVENOUS
  Filled 2018-06-12 (×14): qty 1

## 2018-06-12 NOTE — Progress Notes (Signed)
Pt refused potassium, offered to dissolve in applesauce or water. Pt declined. Provider notified.

## 2018-06-12 NOTE — Progress Notes (Signed)
PROGRESS NOTE  PHONG ISENBERG  PPJ:[id number] DOB: Jan 13, 1971 DOA: 06/09/2018 PCP: [name], MD   Brief Narrative:  Mr. Roback is a 47 y.o. male with a past medical history significant for hypertension, hyperlipidemia, diabetes mellitus type 2, depression, peripheral neuropathy, chronic back pain, tobacco and alcohol abuse, and gastroparesis. He presented to the emergency department on 7/15 complaining of 3 day history of nausea and vomiting (coffee-ground emesis). Associated symptoms included abdominal bloating and poor appetite. Patient's reported last drink was about ~30 days prior to admission. Due to poor appetite and intake, he reports he had not been using his insulin as prescribed. In the emergency department, his vitals were BP 153/109, HR 143, RR 25, SpO2 100%. He was found to have mild distention of the abdomen. He was found to have DKA with BG 479, Bicarb 10, anion gap 26, WBC 24.4, Calcium 11.1, K 4.0, pH 7.2. CXR showed no active disease. He was started on IVF and insulin drip in the ER and admitted to the stepdown unit for DKA.  On the 15th, he became agitated and experiencing hallucinations. He has had these intermittent episodes of delirium throughout his stay and was transferred to the ICU. It was unclear whether this was metabolic in nature or alcohol withdrawal. Head CT was unremarkable other than let mastoid effusions. He required Precedex for snoring respirations. He was placed on CIWA precautions and thiamine/folate for 3 days. He returned to [facility] floor 7/18.  Subjective: Patient is complaining of persistent abdominal pain that he describes as constant and epigastric. He believes that the pain has not improved or worsened since admission. It is slightly improved by laying on his side. He reports he has not been eating or drinking due to poor appetite and persistent nausea, despite zofran and phenergan. He has had one episode of vomiting this morning. He is requesting pain  medication. He also reports that he has not had a bowel movement in many days, but is passing some gas. He is complaining of burping and odynophagia (which is burning in nature). No CP, SOB, fevers/chills, diarrhea or hemoptysis.   Assessment & Plan: DKA from insulin non-compliance (due to poor PO intake) - BG still high 100s - low 200s. Anion gap 13 today, up from 11. Discontinue dextrose infusion due to hyperglycemia. - Continue sliding scale insulin. - Follow with daily BMP and closely monitor BGs. - Transition to home insulin regimen once oral intake improves. Better control of NV and abdominal pain will help increase oral intake.  GI: Abdominal Pain; Hematemesis  - Abdominal pain likely due acid reflux / gastritis. Patient was ordered IV Pepcid, carafate, zofran q 4 hours prn, morphine q 2 hours prn for symptoms.   - Consider EGD if symptoms do not improve. - Hematemesis likely Mallory Weiss tear from vomiting. Hb stable - no evidence of GI bleed. No additional episodes of hematemesis since admission.   Neuro: Delirium; Depression  - No further episodes of delirium. It was likely metabolic combined with alcohol withdrawal. Head CT was reassuring. Thiamine and folate for 3 days  - Continue paxil.  Hypokalemia  - K is 3.0 today. Given 80 meq PO, 20 meq IV  Hypertension  - Stable. Continue Norvasc 5 mg and metoprolol succinate 25 mg. Hydralazine 5 mg PRN.  DVT prophylaxis: SCDs Code Status: FULL Family Communication: No family at bedside Disposition Plan: Unclear, dependent on clinical improvement (symptoms, BG control, electrolytes) Consultants:   Pulmonary Critical Care  Procedures:   None  Antimicrobials:   None   Objective: Vitals:   06/11/18 1720 06/11/18 2111 06/12/18 0505 06/12/18 1009  BP: (!) 137/92 (!) 145/94 (!) 173/110 (!) 140/97  Pulse: (!) 109 (!) 107 (!) 109 (!) 118  Resp: 16 16 16 17   Temp: 98.3 F (36.8 C) 99 F (37.2 C) 98.3 F (36.8 C) 98.7 F  (37.1 C)  TempSrc: Oral Oral Oral Oral  SpO2: 100% 100% 100% 99%  Weight:      Height:        Intake/Output Summary (Last 24 hours) at 06/12/2018 1306 Last data filed at 06/12/2018 1042 Gross per 24 hour  Intake 500.08 ml  Output 2575 ml  Net -2074.92 ml   Filed Weights   06/09/18 1700 06/10/18 0428 06/11/18 1537  Weight: 101.1 kg (222 lb 14.2 oz) 101.6 kg (223 lb 15.8 oz) 105.2 kg (231 lb 14.8 oz)   Examination: General appearance: adult male, awake and alert. NAD. Appears uncomfortable. HEENT: Anicteric, conjunctiva pink, lids and lashes normal. No nasal deformity, discharge, epistaxis.  Skin: Warm and dry. No jaundice. No suspicious rashes or lesions. Cardiac: RRR, nl S1-S2, no murmurs appreciated. No JVD. No LE edema. No cyanosis. Distal pulses are intact bilaterally. Respiratory: Normal respiratory rate and rhythm. CTAB without wheezes, crackles or rales. Abdomen: Abdomen soft and non-distended. Epigastric TTP. No ascites, masses, hepatosplenomegaly appreciated. Positive bowel sounds throughout. MSK: No deformities or effusions. Neuro: AOx4. Moves all extremities. Speech fluent.     Data Reviewed: I have personally reviewed following labs and imaging studies:  CBC: Recent Labs  Lab 06/09/18 0409  06/09/18 1941 06/10/18 0231 06/10/18 0442 06/11/18 0329 06/12/18 0610  WBC 24.4*   < > 25.6* 20.1* 19.0* 12.8* 10.6*  NEUTROABS 21.4*  --   --   --  15.7*  --   --   HGB 19.1*   < > 15.2 14.9 14.9 15.1 15.0  HCT 56.7*   < > 43.2 41.6 42.7 42.7 43.7  MCV 91.5   < > 88.7 87.2 87.7 87.3 88.5  PLT 324   < > 211 188 187 163 181   < > = values in this interval not displayed.   Basic Metabolic Panel: Recent Labs  Lab 06/10/18 0442 06/10/18 1015 06/10/18 1409 06/11/18 0329 06/12/18 0610  NA 141 140 139 139 140  K 2.7* 3.0* 2.9* 3.2* 3.0*  CL 117* 115* 114* 109 103  CO2 17* 18* 18* 19* 24  GLUCOSE 92 92 108* 219* 195*  BUN 10 12 10 6  5*  CREATININE 0.74 0.71 0.67 0.70  0.69  CALCIUM 8.5* 8.4* 8.2* 8.5* 8.4*  MG 1.8  --   --  1.8 2.1  PHOS <1.0*  --  1.9* 1.2*  --    GFR: Estimated Creatinine Clearance: 139.4 mL/min (by C-G formula based on SCr of 0.69 mg/dL).  Liver Function Tests: Recent Labs  Lab 06/10/18 0442  AST 34  ALT 34  ALKPHOS 60  BILITOT 1.3*  PROT 5.9*  ALBUMIN 3.4*   Recent Labs  Lab 06/09/18 0713  LIPASE 123*   Coagulation Profile: Recent Labs  Lab 06/09/18 0713 06/10/18 0442 06/11/18 0329  INR 1.31 1.30 1.20   Cardiac Enzymes: Recent Labs  Lab 06/09/18 1642 06/09/18 2200 06/10/18 0442  TROPONINI 0.05* 0.04* 0.13*   CBG: Recent Labs  Lab 06/11/18 1108 06/11/18 1722 06/11/18 2109 06/12/18 0753 06/12/18 1136  GLUCAP 216* 156* 192* 195* 216*   Urine analysis:    Component Value Date/Time  COLORURINE STRAW (A) 06/09/2018 0523   APPEARANCEUR CLEAR 06/09/2018 0523   LABSPEC 1.023 06/09/2018 0523   PHURINE 5.0 06/09/2018 0523   GLUCOSEU >=500 (A) 06/09/2018 0523   HGBUR MODERATE (A) 06/09/2018 0523   BILIRUBINUR NEGATIVE 06/09/2018 0523   KETONESUR 80 (A) 06/09/2018 0523   PROTEINUR 30 (A) 06/09/2018 0523   UROBILINOGEN 1.0 09/06/2008 0900   NITRITE NEGATIVE 06/09/2018 0523   LEUKOCYTESUR NEGATIVE 06/09/2018 0523   Recent Results (from the past 240 hour(s))  Culture, blood (x 2)     Status: None (Preliminary result)   Collection Time: 06/09/18  7:49 AM  Result Value Ref Range Status   Specimen Description BLOOD BLOOD LEFT HAND  Final   Special Requests   Final    BOTTLES DRAWN AEROBIC AND ANAEROBIC Blood Culture adequate volume   Culture   Final    NO GROWTH 2 DAYS Performed at [facility], [place] [address] [place], [place] [address]    Report Status PENDING  Incomplete  Culture, blood (x 2)     Status: None (Preliminary result)   Collection Time: 06/09/18  7:49 AM  Result Value Ref Range Status   Specimen Description BLOOD BLOOD RIGHT HAND  Final   Special Requests   Final    BOTTLES  DRAWN AEROBIC AND ANAEROBIC Blood Culture adequate volume   Culture   Final    NO GROWTH 2 DAYS Performed at [facility], [place] [address] [place], [place] [address]    Report Status PENDING  Incomplete  MRSA PCR Screening     Status: None   Collection Time: 06/09/18  9:04 AM  Result Value Ref Range Status   MRSA by PCR NEGATIVE NEGATIVE Final    Comment:        The GeneXpert MRSA Assay (FDA approved for NASAL specimens only), is one component of a comprehensive MRSA colonization surveillance program. It is not intended to diagnose MRSA infection nor to guide or monitor treatment for MRSA infections. Performed at [facility], [place] [address], [place], [place] [address]     Radiology Studies: No results found.  Scheduled Meds: . amLODipine  5 mg Oral Daily  . insulin aspart  0-15 Units Subcutaneous TID WC  . insulin aspart  0-5 Units Subcutaneous QHS  . insulin glargine  20 Units Subcutaneous Daily  . mouth rinse  15 mL Mouth Rinse BID  . metoprolol succinate  25 mg Oral Daily  . multivitamin with minerals  1 tablet Oral Daily  . nicotine  21 mg Transdermal Daily  . pantoprazole  40 mg Oral Daily  . PARoxetine  10 mg Oral BID  . pneumococcal 23 valent vaccine  0.5 mL Intramuscular Tomorrow-1000   Continuous Infusions: . dextrose 5 % and 0.45% NaCl 30 mL/hr at 06/11/18 2021    LOS: 3 days   Time spent: 20 minutes spent with the patient   Katja Blue, PA-S [facility] 06/12/2018, 1:06 PM   [email or web address] Password [email or web address] If 7PM-7AM, please contact night-coverage

## 2018-06-12 NOTE — Progress Notes (Signed)
PROGRESS NOTE    Grant [name]  WUJ:[id number] DOB: 21-Feb-1971 DOA: 06/09/2018 PCP: [name], MD    Brief Narrative:  47 year old male who presented with nausea, vomiting and hematemesis.  He does have the significant past medical history for hypertension, dyslipidemia, type 2 diabetes mellitus, depression, peripheral neuropathy, chronic back pain, alcohol tobacco abuse.  Patient reported 3-day history of nausea, and vomiting, complicated by coffee-ground emesis, abdominal distention and poor oral intake.  On his initial physical examination blood pressure 161/117, heart rate 145, respirate 25, oxygen saturation 100%, dry mucous membranes, lungs clear to auscultation bilaterally, heart S1-S2 present, tachycardic, abdomen was distended, nontender, no lower extremity edema.  134, potassium 3.9, chloride 98, bicarb 10, glucose 483, creatinine 1.52, anion gap 26, white cell count 24, hemoglobin 19.1, hematocrit 56.7, platelets 324, urinalysis 30 protein, specific gravity 1.023, creatinine 500 glucose, 0-5 red cells, 0-5 white cells.  Urine drug testing positive for benzodiazepines and tetrahydrocannabinol.  Chest x-ray negative for infiltrates.  Patient was admitted to the stepdown unit with a working diagnosis of diabetes ketoacidosis, complicated by acute kidney injury and hematemesis.  7/15. Patient developed hallucinations, altered mentation, required transfer to intensive care unit, he received lytics infusion as needed benzodiazepine per CIWA protocol.   He was transferred to the medical floor on August 18.  Assessment & Plan:   Principal Problem:   DKA (diabetic ketoacidoses) ([place]) Active Problems:   Essential hypertension   ALCOHOL ABUSE, HX OF   Dehydration   Nausea and vomiting   Hypercalcemia   SIRS (systemic inflammatory response syndrome) ([facility])   Hematemesis   Type 2 diabetes mellitus with peripheral neuropathy ([place])   1. DKA. Patient is now on sq insulin therapy 20  units plus insulin sliding scale, anion gap is 13. He is not fully tolerating po intake due to abdominal pain and reflux. Will continue current insulin regimen, capillary glucose 216, 156. 192. 195, 216. At home is on 70 units of nph (30 units in am and 40 units in pm). Will stop IV dextrose.   2. Suspected gastritis with Mallory Weis tear. Will change antiacid therapy to IV famotidine bid and will add sucralfate, continue po diet as tolerated, will add IV morphine and po acetaminophen for pain control. Patient with history of recent alcohol and tobacco abuse, high pretest probability for peptic ulcer disease.   3. Pre- renal AKI with Hypokalemia. Will continue k correction with kcl, will give 20 IV and 80 po, follow on renal panel in am, renal function with preserved serum cr. Continue gentle hydration with saline. Renal function has improved with serum cr at 0.69 with K at 3,0, will follow on renal panel in am, continue hydration with isotonic saline  4. HTN. Will continue blood pressure control with metoprolol and amlodipine.   5. Depression, tobacco and alcohol abuse. Will continue paroxetine and trazodone, not in benzodiazepines at this time, clinically not signs of withdrawal.    DVT prophylaxis: enoxaparin   Code Status: full Family Communication: no family at the beside Disposition Plan/ discharge barriers: Pending improvement of abdominal pain.    Consultants:     Procedures:     Antimicrobials:       Subjective: Patient with persistent abdominal pain, moderate to severe in intensity, associated with nausea and vomiting, worse with meals, no improvement factors, no radiation. No recent bowel movement.   Objective: Vitals:   06/11/18 2111 06/12/18 0505 06/12/18 1009 06/12/18 1403  BP: (!) 145/94 (!) 173/110 (!) 140/97 Marland Kitchen)  143/88  Pulse: (!) 107 (!) 109 (!) 118 (!) 102  Resp: 16 16 17 16   Temp: 99 F (37.2 C) 98.3 F (36.8 C) 98.7 F (37.1 C) 98.6 F (37 C)  TempSrc:  Oral Oral Oral Oral  SpO2: 100% 100% 99% 99%  Weight:      Height:        Intake/Output Summary (Last 24 hours) at 06/12/2018 1525 Last data filed at 06/12/2018 1451 Gross per 24 hour  Intake 891 ml  Output 2525 ml  Net -1634 ml   Filed Weights   06/09/18 1700 06/10/18 0428 06/11/18 1537  Weight: 101.1 kg (222 lb 14.2 oz) 101.6 kg (223 lb 15.8 oz) 105.2 kg (231 lb 14.8 oz)    Examination:   General: deconditioned and ill looking appearing  Neurology: Awake and alert, non focal  E ENT: mild pallor, no icterus, oral mucosa moist Cardiovascular: No JVD. S1-S2 present, rhythmic, no gallops, rubs, or murmurs. No lower extremity edema. Pulmonary: positive breath sounds bilaterally, adequate air movement, no wheezing, rhonchi or rales. Gastrointestinal. Abdomen with no organomegaly, non tender, no rebound or guarding Skin. No rashes Musculoskeletal: no joint deformities     Data Reviewed: I have personally reviewed following labs and imaging studies  CBC: Recent Labs  Lab 06/09/18 0409  06/09/18 1941 06/10/18 0231 06/10/18 0442 06/11/18 0329 06/12/18 0610  WBC 24.4*   < > 25.6* 20.1* 19.0* 12.8* 10.6*  NEUTROABS 21.4*  --   --   --  15.7*  --   --   HGB 19.1*   < > 15.2 14.9 14.9 15.1 15.0  HCT 56.7*   < > 43.2 41.6 42.7 42.7 43.7  MCV 91.5   < > 88.7 87.2 87.7 87.3 88.5  PLT 324   < > 211 188 187 163 181   < > = values in this interval not displayed.   Basic Metabolic Panel: Recent Labs  Lab 06/10/18 0442 06/10/18 1015 06/10/18 1409 06/11/18 0329 06/12/18 0610  NA 141 140 139 139 140  K 2.7* 3.0* 2.9* 3.2* 3.0*  CL 117* 115* 114* 109 103  CO2 17* 18* 18* 19* 24  GLUCOSE 92 92 108* 219* 195*  BUN 10 12 10 6  5*  CREATININE 0.74 0.71 0.67 0.70 0.69  CALCIUM 8.5* 8.4* 8.2* 8.5* 8.4*  MG 1.8  --   --  1.8 2.1  PHOS <1.0*  --  1.9* 1.2*  --    GFR: Estimated Creatinine Clearance: 139.4 mL/min (by C-G formula based on SCr of 0.69 mg/dL). Liver Function  Tests: Recent Labs  Lab 06/10/18 0442  AST 34  ALT 34  ALKPHOS 60  BILITOT 1.3*  PROT 5.9*  ALBUMIN 3.4*   Recent Labs  Lab 06/09/18 0713  LIPASE 123*   No results for input(s): AMMONIA in the last 168 hours. Coagulation Profile: Recent Labs  Lab 06/09/18 0713 06/10/18 0442 06/11/18 0329  INR 1.31 1.30 1.20   Cardiac Enzymes: Recent Labs  Lab 06/09/18 1642 06/09/18 2200 06/10/18 0442  TROPONINI 0.05* 0.04* 0.13*   BNP (last 3 results) No results for input(s): PROBNP in the last 8760 hours. HbA1C: No results for input(s): HGBA1C in the last 72 hours. CBG: Recent Labs  Lab 06/11/18 1108 06/11/18 1722 06/11/18 2109 06/12/18 0753 06/12/18 1136  GLUCAP 216* 156* 192* 195* 216*   Lipid Profile: No results for input(s): CHOL, HDL, LDLCALC, TRIG, CHOLHDL, LDLDIRECT in the last 72 hours. Thyroid Function Tests: No results for input(s):  TSH, T4TOTAL, FREET4, T3FREE, THYROIDAB in the last 72 hours. Anemia Panel: No results for input(s): VITAMINB12, FOLATE, FERRITIN, TIBC, IRON, RETICCTPCT in the last 72 hours.    Radiology Studies: I have reviewed all of the imaging during this hospital visit personally     Scheduled Meds: . amLODipine  5 mg Oral Daily  . insulin aspart  0-15 Units Subcutaneous TID WC  . insulin aspart  0-5 Units Subcutaneous QHS  . insulin glargine  20 Units Subcutaneous Daily  . mouth rinse  15 mL Mouth Rinse BID  . metoprolol succinate  25 mg Oral Daily  . multivitamin with minerals  1 tablet Oral Daily  . nicotine  21 mg Transdermal Daily  . PARoxetine  10 mg Oral BID  . pneumococcal 23 valent vaccine  0.5 mL Intramuscular Tomorrow-1000  . potassium chloride  40 mEq Oral Q4H  . sucralfate  1 g Oral TID WC & HS   Continuous Infusions: . famotidine (PEPCID) IV 20 mg (06/12/18 1504)  . potassium chloride 10 mEq (06/12/18 1507)     LOS: 3 days        Mauricio Gerome Apley, MD [facility] Pager [phone]

## 2018-06-13 LAB — BASIC METABOLIC PANEL
Anion gap: 11 (ref 5–15)
BUN: 6 mg/dL (ref 6–20)
CHLORIDE: 103 mmol/L (ref 98–111)
CO2: 24 mmol/L (ref 22–32)
CREATININE: 0.58 mg/dL — AB (ref 0.61–1.24)
Calcium: 8.3 mg/dL — ABNORMAL LOW (ref 8.9–10.3)
GFR calc non Af Amer: >60 mL/min (ref >60)
Glucose, Bld: 167 mg/dL — ABNORMAL HIGH (ref 70–99)
POTASSIUM: 3 mmol/L — AB (ref 3.5–5.1)
Sodium: 138 mmol/L (ref 135–145)

## 2018-06-13 LAB — GLUCOSE, CAPILLARY
GLUCOSE-CAPILLARY: 141 mg/dL — AB (ref 70–99)
GLUCOSE-CAPILLARY: 162 mg/dL — AB (ref 70–99)
Glucose-Capillary: 162 mg/dL — ABNORMAL HIGH (ref 70–99)
Glucose-Capillary: 166 mg/dL — ABNORMAL HIGH (ref 70–99)

## 2018-06-13 MED ORDER — BISACODYL 5 MG PO TBEC
10.0000 mg | DELAYED_RELEASE_TABLET | Freq: Once | ORAL | Status: AC
  Administered 2018-06-13: 10 mg via ORAL

## 2018-06-13 MED ORDER — POTASSIUM CHLORIDE CRYS ER 20 MEQ PO TBCR
40.0000 meq | EXTENDED_RELEASE_TABLET | ORAL | Status: AC
  Administered 2018-06-13: 40 meq via ORAL
  Filled 2018-06-13 (×2): qty 2

## 2018-06-13 MED ORDER — POLYETHYLENE GLYCOL 3350 17 G PO PACK
17.0000 g | PACK | Freq: Two times a day (BID) | ORAL | Status: DC
  Administered 2018-06-13: 17 g via ORAL
  Filled 2018-06-13 (×7): qty 1

## 2018-06-13 MED ORDER — PANTOPRAZOLE SODIUM 40 MG PO TBEC
40.0000 mg | DELAYED_RELEASE_TABLET | Freq: Two times a day (BID) | ORAL | Status: DC
  Administered 2018-06-13 – 2018-06-14 (×3): 40 mg via ORAL
  Filled 2018-06-13 (×3): qty 1

## 2018-06-13 NOTE — Progress Notes (Signed)
PROGRESS NOTE    Grant [name]  XMI:[id number] DOB: [birth year] DOA: 06/09/2018 PCP: [name], MD    Brief Narrative:  47 year old male who presented with nausea, vomiting and hematemesis.  He does have the significant past medical history for hypertension, dyslipidemia, type 2 diabetes mellitus, depression, peripheral neuropathy, chronic back pain, alcohol tobacco abuse.  Patient reported 3-day history of nausea, and vomiting, complicated by coffee-ground emesis, abdominal distention and poor oral intake.  On his initial physical examination blood pressure 161/117, heart rate 145, respirate 25, oxygen saturation 100%, dry mucous membranes, lungs clear to auscultation bilaterally, heart S1-S2 present, tachycardic, abdomen was distended, nontender, no lower extremity edema.  134, potassium 3.9, chloride 98, bicarb 10, glucose 483, creatinine 1.52, anion gap 26, white cell count 24, hemoglobin 19.1, hematocrit 56.7, platelets 324, urinalysis 30 protein, specific gravity 1.023, creatinine 500 glucose, 0-5 red cells, 0-5 white cells.  Urine drug testing positive for benzodiazepines and tetrahydrocannabinol.  Chest x-ray negative for infiltrates.  Patient was admitted to the stepdown unit with a working diagnosis of diabetes ketoacidosis, complicated by acute kidney injury and hematemesis.  7/15. Patient developed hallucinations, altered mentation, required transfer to intensive care unit, he received lytics infusion as needed benzodiazepine per CIWA protocol.   He was transferred to the medical floor on August 18.  Assessment & Plan:   Principal Problem:   DKA (diabetic ketoacidoses) ([place]) Active Problems:   Essential hypertension   ALCOHOL ABUSE, HX OF   Dehydration   Nausea and vomiting   Hypercalcemia   SIRS (systemic inflammatory response syndrome) ([facility])   Hematemesis   Type 2 diabetes mellitus with peripheral neuropathy ([place])   1. T2DM with resolved DKA. Continue current  insulin regimen with 20 units on lantus and insulin sliding scale for glucose cover and monitoring, still very poor oral intake. Continue capillary glucose monitoring, 216,, 187, 169, 166.   2. Suspected gastritis with Mallory Weis tear. Improved pain with antiacid therapy will change IV famotidine to po pantoprazole bid, continue sucralfate qid, continue po diet as tolerated, will hold on further IV fluids.   3. Pre- renal AKI with Hypokalemia. Stable renal function with serum cr down to 0,58 persistent hypokalemia, will give 40 nmeq kcl x3 and will follow on renal panel in am.   4. HTN. Continue with metoprolol and amlodipine for blood pressure control.    5. Depression, tobacco and alcohol abuse. On  paroxetine and trazodone, no clinically signs of withdrawal.    DVT prophylaxis: enoxaparin   Code Status: full Family Communication: no family at the beside Disposition Plan/ discharge barriers: Pending improvement of abdominal pain.    Consultants:     Procedures:     Antimicrobials:       Subjective: Abdominal pain and gerd improved with antiacids but still not back to baseline, poor appetite. No chest pain or dyspnea, Positive constipation for the last 3 days.   Objective: Vitals:   06/12/18 1735 06/12/18 2103 06/13/18 0453 06/13/18 1118  BP: (!) 140/99 (!) 140/97 127/84 (!) 149/103  Pulse: (!) 108 97 91 100  Resp: 16 16 16 17   Temp: 98.5 F (36.9 C) 98.4 F (36.9 C) 98.2 F (36.8 C) 98.2 F (36.8 C)  TempSrc: Oral Oral Oral Oral  SpO2: 99% 100% 97% 99%  Weight:      Height:        Intake/Output Summary (Last 24 hours) at 06/13/2018 1550 Last data filed at 06/13/2018 1345 Gross per 24 hour  Intake  1645 ml  Output 1475 ml  Net 170 ml   Filed Weights   06/09/18 1700 06/10/18 0428 06/11/18 1537  Weight: 101.1 kg (222 lb 14.2 oz) 101.6 kg (223 lb 15.8 oz) 105.2 kg (231 lb 14.8 oz)    Examination:   General: Not in pain or dyspnea, deconditioned    Neurology: Awake and alert, non focal  E ENT: positive pallor, no icterus, oral mucosa moist Cardiovascular: No JVD. S1-S2 present, rhythmic, no gallops, rubs, or murmurs. No lower extremity edema. Pulmonary: posiitve breath sounds bilaterally, adequate air movement, no wheezing, rhonchi or rales. Gastrointestinal. Abdomen protuberant with no organomegaly, non tender, no rebound or guarding Skin. No rashes Musculoskeletal: no joint deformities     Data Reviewed: I have personally reviewed following labs and imaging studies  CBC: Recent Labs  Lab 06/09/18 0409  06/09/18 1941 06/10/18 0231 06/10/18 0442 06/11/18 0329 06/12/18 0610  WBC 24.4*   < > 25.6* 20.1* 19.0* 12.8* 10.6*  NEUTROABS 21.4*  --   --   --  15.7*  --   --   HGB 19.1*   < > 15.2 14.9 14.9 15.1 15.0  HCT 56.7*   < > 43.2 41.6 42.7 42.7 43.7  MCV 91.5   < > 88.7 87.2 87.7 87.3 88.5  PLT 324   < > 211 188 187 163 181   < > = values in this interval not displayed.   Basic Metabolic Panel: Recent Labs  Lab 06/10/18 0442 06/10/18 1015 06/10/18 1409 06/11/18 0329 06/12/18 0610 06/13/18 0354  NA 141 140 139 139 140 138  K 2.7* 3.0* 2.9* 3.2* 3.0* 3.0*  CL 117* 115* 114* 109 103 103  CO2 17* 18* 18* 19* 24 24  GLUCOSE 92 92 108* 219* 195* 167*  BUN 10 12 10 6  5* 6  CREATININE 0.74 0.71 0.67 0.70 0.69 0.58*  CALCIUM 8.5* 8.4* 8.2* 8.5* 8.4* 8.3*  MG 1.8  --   --  1.8 2.1  --   PHOS <1.0*  --  1.9* 1.2*  --   --    GFR: Estimated Creatinine Clearance: 139.4 mL/min (A) (by C-G formula based on SCr of 0.58 mg/dL (L)). Liver Function Tests: Recent Labs  Lab 06/10/18 0442  AST 34  ALT 34  ALKPHOS 60  BILITOT 1.3*  PROT 5.9*  ALBUMIN 3.4*   Recent Labs  Lab 06/09/18 0713  LIPASE 123*   No results for input(s): AMMONIA in the last 168 hours. Coagulation Profile: Recent Labs  Lab 06/09/18 0713 06/10/18 0442 06/11/18 0329  INR 1.31 1.30 1.20   Cardiac Enzymes: Recent Labs  Lab  06/09/18 1642 06/09/18 2200 06/10/18 0442  TROPONINI 0.05* 0.04* 0.13*   BNP (last 3 results) No results for input(s): PROBNP in the last 8760 hours. HbA1C: No results for input(s): HGBA1C in the last 72 hours. CBG: Recent Labs  Lab 06/12/18 1136 06/12/18 1732 06/12/18 2103 06/13/18 0812 06/13/18 1213  GLUCAP 216* 187* 169* 162* 166*   Lipid Profile: No results for input(s): CHOL, HDL, LDLCALC, TRIG, CHOLHDL, LDLDIRECT in the last 72 hours. Thyroid Function Tests: No results for input(s): TSH, T4TOTAL, FREET4, T3FREE, THYROIDAB in the last 72 hours. Anemia Panel: No results for input(s): VITAMINB12, FOLATE, FERRITIN, TIBC, IRON, RETICCTPCT in the last 72 hours.    Radiology Studies: I have reviewed all of the imaging during this hospital visit personally     Scheduled Meds: . amLODipine  5 mg Oral Daily  . insulin aspart  0-15 Units Subcutaneous TID WC  . insulin aspart  0-5 Units Subcutaneous QHS  . insulin glargine  20 Units Subcutaneous Daily  . mouth rinse  15 mL Mouth Rinse BID  . metoprolol succinate  25 mg Oral Daily  . multivitamin with minerals  1 tablet Oral Daily  . nicotine  21 mg Transdermal Daily  . PARoxetine  10 mg Oral BID  . pneumococcal 23 valent vaccine  0.5 mL Intramuscular Tomorrow-1000  . sucralfate  1 g Oral TID WC & HS   Continuous Infusions: . sodium chloride 75 mL/hr at 06/13/18 0500  . famotidine (PEPCID) IV 20 mg (06/13/18 1041)     LOS: 4 days        Mauricio Gerome Apley, MD [facility] Pager [phone]

## 2018-06-13 NOTE — Care Management Note (Signed)
Case Management Note  Patient Details  Name: Grant [name] MRN: [id number] Date of Birth: 14-Aug-1971  Subjective/Objective:  DKA, HTN, alcohol abuse, Nausea, vomiting, DM2                  Action/Plan: NCM spoke to pt and he does not have a job or insurance. He goes to PCP, Dr Philip Aspen and sees Endocrinologist, Dr Jacelyn Pi. States he has been using the [employer] of insulin. Explained MATCH with $3 copay and once per year use. Will need a MATCH letter at dc. Updated attending. Has appt at [facility] on 07/09/2018 at 1450 pm.   Expected Discharge Date:                  Expected Discharge Plan:  Home/Self Care  In-House Referral:  NA  Discharge planning Services  CM Consult, [place] [facility], Medication Assistance  Post Acute Care Choice:  NA Choice offered to:  NA  DME Arranged:  N/A DME Agency:  NA  HH Arranged:  NA HH Agency:  NA  Status of Service:  In process, will continue to follow  If discussed at Long Length of Stay Meetings, dates discussed:    Additional Comments:  Erenest Rasher, RN 06/13/2018, 3:28 PM

## 2018-06-13 NOTE — Plan of Care (Signed)
  Problem: Education: Goal: Knowledge of General Education information will improve Outcome: Progressing   Problem: Clinical Measurements: Goal: Will remain free from infection Outcome: Progressing Goal: Respiratory complications will improve Outcome: Progressing Goal: Cardiovascular complication will be avoided Outcome: Progressing   Problem: Elimination: Goal: Will not experience complications related to bowel motility Outcome: Progressing Goal: Will not experience complications related to urinary retention Outcome: Progressing

## 2018-06-14 ENCOUNTER — Encounter (HOSPITAL_COMMUNITY): Payer: Self-pay | Admitting: *Deleted

## 2018-06-14 DIAGNOSIS — E876 Hypokalemia: Secondary | ICD-10-CM

## 2018-06-14 DIAGNOSIS — R1013 Epigastric pain: Secondary | ICD-10-CM

## 2018-06-14 DIAGNOSIS — K2900 Acute gastritis without bleeding: Secondary | ICD-10-CM

## 2018-06-14 LAB — BASIC METABOLIC PANEL
ANION GAP: 12 (ref 5–15)
BUN: 6 mg/dL (ref 6–20)
CALCIUM: 8.4 mg/dL — AB (ref 8.9–10.3)
CO2: 22 mmol/L (ref 22–32)
Chloride: 104 mmol/L (ref 98–111)
Creatinine, Ser: 0.61 mg/dL (ref 0.61–1.24)
GFR calc Af Amer: >60 mL/min (ref >60)
GFR calc non Af Amer: >60 mL/min (ref >60)
GLUCOSE: 146 mg/dL — AB (ref 70–99)
Potassium: 3.2 mmol/L — ABNORMAL LOW (ref 3.5–5.1)
SODIUM: 138 mmol/L (ref 135–145)

## 2018-06-14 LAB — CULTURE, BLOOD (ROUTINE X 2)
CULTURE: NO GROWTH
Culture: NO GROWTH
SPECIAL REQUESTS: ADEQUATE
Special Requests: ADEQUATE

## 2018-06-14 LAB — GLUCOSE, CAPILLARY
GLUCOSE-CAPILLARY: 198 mg/dL — AB (ref 70–99)
Glucose-Capillary: 142 mg/dL — ABNORMAL HIGH (ref 70–99)
Glucose-Capillary: 144 mg/dL — ABNORMAL HIGH (ref 70–99)
Glucose-Capillary: 112 mg/dL — ABNORMAL HIGH (ref 70–99)

## 2018-06-14 LAB — MAGNESIUM: MAGNESIUM: 1.8 mg/dL (ref 1.7–2.4)

## 2018-06-14 MED ORDER — METOCLOPRAMIDE HCL 5 MG/ML IJ SOLN
10.0000 mg | Freq: Three times a day (TID) | INTRAMUSCULAR | Status: DC
  Administered 2018-06-14 – 2018-06-15 (×3): 10 mg via INTRAVENOUS
  Filled 2018-06-14 (×3): qty 2

## 2018-06-14 MED ORDER — POTASSIUM CHLORIDE CRYS ER 20 MEQ PO TBCR
40.0000 meq | EXTENDED_RELEASE_TABLET | ORAL | Status: AC
  Administered 2018-06-14: 40 meq via ORAL
  Filled 2018-06-14 (×2): qty 2

## 2018-06-14 MED ORDER — MAGNESIUM SULFATE 2 GM/50ML IV SOLN
2.0000 g | Freq: Once | INTRAVENOUS | Status: AC
  Administered 2018-06-14: 2 g via INTRAVENOUS
  Filled 2018-06-14 (×2): qty 50

## 2018-06-14 NOTE — Consult Note (Signed)
Referring Provider: Dr. Cathlean Sauer, [facility] Primary Care Physician:  [name], MD Primary Gastroenterologist:  Dr. Deatra Ina in 2008  Reason for Consultation: Epigastric pain, nausea, vomiting, coffee-ground emesis  HPI: Grant [name] is a 47 y.o. male with a history of diabetes with neuropathy, hypertension, dyslipidemia, history of alcohol and tobacco abuse, history of inactive hepatitis B (core positive, surface antigen negative), history of depression admitted with DKA complicated by acute kidney injury and report of hematemesis.  He was admitted on 06/09/2018 with hyperglycemia/DKA, nausea with vomiting and coffee-ground emesis.  At the time of admission he had leukocytosis, elevated calcium, tachycardia.  He then developed hallucinations/altered mental status where he was transferred to the ICU for CIWA protocol.  Now he continues to report epigastric abdominal burning pain which is worse postprandially.  Appetite has been poor.  Nausea without vomiting in the last several days.  He does feel constipated over the last 2 days.  He feels upper abdominal bloating and early satiety.  No further coffee-ground emesis since the day of admission.  He does report a long history of heavy alcohol, liquor, abuse stopping on 05/07/2018.  He states he stopped because he knew he needed to.  He reports that he has had a very stressful year.  His mother died after 6-week hospitalization, he lost his brother also this year.  His 89 year old father moved in with him so that the patient could help take care of him.   Past Medical History:  Diagnosis Date  . Chronic low back pain 11/30/2015  . Diabetic peripheral neuropathy ([place]) 11/30/2015  . Hypertension   . Neuropathy   . Type 2 diabetes mellitus ([place])     Past Surgical History:  Procedure Laterality Date  . back lipoma resection  03/2006  . KNEE ARTHROSCOPY  10/2008   left  . LEFT HEART CATHETERIZATION WITH CORONARY ANGIOGRAM N/A  08/11/2013   Procedure: LEFT HEART CATHETERIZATION WITH CORONARY ANGIOGRAM;  Surgeon: Pixie Casino, MD;  Location: [facility] CATH LAB;  Service: Cardiovascular;  Laterality: N/A;  . UPPER GI ENDOSCOPY  10/2007   showed gastritis    Prior to Admission medications   Medication Sig Start Date End Date Taking? Authorizing Provider  amLODipine (NORVASC) 5 MG tablet Take 5 mg by mouth daily. 06/07/18  Yes [provider]  insulin NPH Human (HUMULIN N,NOVOLIN N) 100 UNIT/ML injection Inject 30-40 Units into the skin See admin instructions. Use 30 units every morning then use 40 units every evening   Yes [provider]  insulin regular (NOVOLIN R,HUMULIN R) 100 units/mL injection Inject 4-8 Units into the skin 3 (three) times daily before meals. Sliding scale   Yes [provider]  metoprolol succinate (TOPROL-XL) 25 MG 24 hr tablet Take 25 mg by mouth daily. 06/07/18  Yes [provider]  PARoxetine (PAXIL) 10 MG tablet Take 10 mg by mouth 2 (two) times daily. 05/08/18  Yes [provider]  aspirin EC 81 MG tablet Take 1 tablet (81 mg total) by mouth daily. Patient not taking: Reported on 09/17/2016 08/11/13   Consuelo Pandy, PA-C    Current Facility-Administered Medications  Medication Dose Route Frequency Provider Last Rate Last Dose  . acetaminophen (TYLENOL) tablet 650 mg  650 mg Oral Q6H PRN Arrien, Jimmy Picket, MD      . amLODipine (NORVASC) tablet 5 mg  5 mg Oral Daily Collene Gobble, MD   5 mg at 06/14/18 0900  . insulin aspart (novoLOG) injection  0-15 Units  0-15 Units Subcutaneous TID WC Collene Gobble, MD   3 Units at 06/14/18 0901  . insulin aspart (novoLOG) injection 0-5 Units  0-5 Units Subcutaneous QHS Collene Gobble, MD   2 Units at 06/10/18 2154  . insulin glargine (LANTUS) injection 20 Units  20 Units Subcutaneous Daily Collene Gobble, MD   20 Units at 06/14/18 [phone]  . magnesium sulfate IVPB 2 g 50 mL  2 g Intravenous Once Arrien,  Jimmy Picket, MD      . MEDLINE mouth rinse  15 mL Mouth Rinse BID Collene Gobble, MD   15 mL at 06/13/18 1040  . metoprolol succinate (TOPROL-XL) 24 hr tablet 25 mg  25 mg Oral Daily Collene Gobble, MD   25 mg at 06/14/18 0858  . morphine 2 MG/ML injection 1 mg  1 mg Intravenous Q2H PRN Arrien, Jimmy Picket, MD   1 mg at 06/13/18 2213  . multivitamin with minerals tablet 1 tablet  1 tablet Oral Daily Collene Gobble, MD   1 tablet at 06/14/18 0900  . nicotine (NICODERM CQ - dosed in mg/24 hours) patch 21 mg  21 mg Transdermal Daily Collene Gobble, MD      . ondansetron [facility]) injection 4 mg  4 mg Intravenous Q4H PRN Arrien, Jimmy Picket, MD   4 mg at 06/13/18 1536  . pantoprazole (PROTONIX) EC tablet 40 mg  40 mg Oral BID Arrien, Jimmy Picket, MD   40 mg at 06/14/18 0900  . PARoxetine (PAXIL) tablet 10 mg  10 mg Oral BID Collene Gobble, MD   10 mg at 06/14/18 0909  . pneumococcal 23 valent vaccine (PNU-IMMUNE) injection 0.5 mL  0.5 mL Intramuscular Tomorrow-1000 Byrum, Rose Fillers, MD      . polyethylene glycol (MIRALAX / GLYCOLAX) packet 17 g  17 g Oral BID Tawni Millers, MD   17 g at 06/13/18 2036  . potassium chloride SA (K-DUR,KLOR-CON) CR tablet 40 mEq  40 mEq Oral Q4H Arrien, Jimmy Picket, MD      . promethazine [facility]) injection 12.5 mg  12.5 mg Intravenous Q6H PRN Collene Gobble, MD   12.5 mg at 06/13/18 2213  . sucralfate (CARAFATE) 1 GM/10ML suspension 1 g  1 g Oral TID WC & HS Arrien, Jimmy Picket, MD   1 g at 06/14/18 0900  . traZODone (DESYREL) tablet 100 mg  100 mg Oral QHS PRN Chesley Mires, MD   100 mg at 06/11/18 2233    Allergies as of 06/09/2018 - Review Complete 06/09/2018  Allergen Reaction Noted  . Tylox [oxycodone-acetaminophen] Nausea And Vomiting 08/05/2013    Family History  Problem Relation Age of Onset  . Diabetes Mother   . Hypertension Mother   . Hypertension Father   . Hyperlipidemia Father   . Alcohol abuse Brother   .  Brain cancer Unknown        grandparent  . Stomach cancer Unknown        grandparent    Social History   Socioeconomic History  . Marital status: Married    Spouse name: Not on file  . Number of children: 2  . Years of education: college  . Highest education level: Not on file  Occupational History  . Occupation: disability  Tobacco Use  . Smoking status: Current Every Day Smoker    Packs/day: 0.50    Years: 20.00    Pack years: 10.00  . Smokeless tobacco: Current User  Types: Chew  . Tobacco comment: 5-7 cigarettes/day; chews occasionally  Substance and Sexual Activity  . Alcohol use: No  . Drug use: No  . Sexual activity: Not on file  Social History Narrative   Patient drinks 1 cup of caffeine daily.   Patient is right handed.     Review of Systems: As per HPI, otherwise negative  Physical Exam: Vital signs in last 24 hours: Temp:  [98.1 F (36.7 C)-98.9 F (37.2 C)] 98.1 F (36.7 C) (07/20 0341) Pulse Rate:  [95-101] 95 (07/20 0341) Resp:  [18] 18 (07/19 2300) BP: (128-162)/(82-104) 162/102 (07/20 0341) SpO2:  [99 %-100 %] 99 % (07/20 0341) Last BM Date: 06/07/18 Gen: awake, alert, NAD HEENT: anicteric, op clear CV: RRR, no mrg Pulm: CTA b/l Abd: soft, NT/ND, +BS throughout Ext: no c/c/e Neuro: nonfocal, no asterixis Psych: Normal mood and affect   Intake/Output from previous day: 07/19 0701 - 07/20 0700 In: 240 [P.O.:240] Out: 952 [Urine:950; Stool:2] Intake/Output this shift: No intake/output data recorded.  Lab Results: Recent Labs    06/12/18 0610  WBC 10.6*  HGB 15.0  HCT 43.7  PLT 181   BMET Recent Labs    06/12/18 0610 06/13/18 0354 06/14/18 0552  NA 140 138 138  K 3.0* 3.0* 3.2*  CL 103 103 104  CO2 24 24 22   GLUCOSE 195* 167* 146*  BUN 5* 6 6  CREATININE 0.69 0.58* 0.61  CALCIUM 8.4* 8.3* 8.4*     Studies/Results: No results found.  IMPRESSION:  47 y.o. male with a history of diabetes with neuropathy,  hypertension, dyslipidemia, history of alcohol and tobacco abuse, history of inactive hepatitis B (core positive, surface antigen negative), history of depression admitted with DKA complicated by acute kidney injury now with ongoing epigastric abdominal pain, early satiety and nausea.  Recent hematemesis  PLAN: 1.  Epigastric pain/nausea with vomiting/recent hematemesis/early satiety --M times most consistent with diabetic gastroparesis.  He may have had a Mallory-Weiss tear while he was in DKA which led to the coffee-ground emesis.  Cannot exclude gastritis or even ulcer disease. --EGD recommended tomorrow with monitored anesthesia care.  N.p.o. after midnight. --Continue twice daily oral PPI --Continue Carafate slurry 1 g 3 times daily before meals and at bedtime --Avoid NSAIDs.  He seems to be committed to no alcohol which certainly will also help his stomach --High suspicion for gastroparesis, may need Reglan temporarily --K needs to be fully supplemented  The nature of the procedure, as well as the risks, benefits, and alternatives were carefully and thoroughly reviewed with the patient. Ample time for discussion and questions allowed. The patient understood, was satisfied, and agreed to proceed.      Lajuan Lines Johnay Mano  06/14/2018, 11:46 AM

## 2018-06-14 NOTE — H&P (View-Only) (Signed)
Referring Provider: Dr. Cathlean Sauer, [facility] Primary Care Physician:  [name], MD Primary Gastroenterologist:  Dr. Deatra Ina in 2008  Reason for Consultation: Epigastric pain, nausea, vomiting, coffee-ground emesis  HPI: Grant [name] is a 47 y.o. male with a history of diabetes with neuropathy, hypertension, dyslipidemia, history of alcohol and tobacco abuse, history of inactive hepatitis B (core positive, surface antigen negative), history of depression admitted with DKA complicated by acute kidney injury and report of hematemesis.  He was admitted on 06/09/2018 with hyperglycemia/DKA, nausea with vomiting and coffee-ground emesis.  At the time of admission he had leukocytosis, elevated calcium, tachycardia.  He then developed hallucinations/altered mental status where he was transferred to the ICU for CIWA protocol.  Now he continues to report epigastric abdominal burning pain which is worse postprandially.  Appetite has been poor.  Nausea without vomiting in the last several days.  He does feel constipated over the last 2 days.  He feels upper abdominal bloating and early satiety.  No further coffee-ground emesis since the day of admission.  He does report a long history of heavy alcohol, liquor, abuse stopping on 05/07/2018.  He states he stopped because he knew he needed to.  He reports that he has had a very stressful year.  His mother died after 6-week hospitalization, he lost his brother also this year.  His 31 year old father moved in with him so that the patient could help take care of him.   Past Medical History:  Diagnosis Date  . Chronic low back pain 11/30/2015  . Diabetic peripheral neuropathy ([place]) 11/30/2015  . Hypertension   . Neuropathy   . Type 2 diabetes mellitus ([place])     Past Surgical History:  Procedure Laterality Date  . back lipoma resection  03/2006  . KNEE ARTHROSCOPY  10/2008   left  . LEFT HEART CATHETERIZATION WITH CORONARY ANGIOGRAM N/A  08/11/2013   Procedure: LEFT HEART CATHETERIZATION WITH CORONARY ANGIOGRAM;  Surgeon: Pixie Casino, MD;  Location: [facility] CATH LAB;  Service: Cardiovascular;  Laterality: N/A;  . UPPER GI ENDOSCOPY  10/2007   showed gastritis    Prior to Admission medications   Medication Sig Start Date End Date Taking? Authorizing Provider  amLODipine (NORVASC) 5 MG tablet Take 5 mg by mouth daily. 06/07/18  Yes [provider]  insulin NPH Human (HUMULIN N,NOVOLIN N) 100 UNIT/ML injection Inject 30-40 Units into the skin See admin instructions. Use 30 units every morning then use 40 units every evening   Yes [provider]  insulin regular (NOVOLIN R,HUMULIN R) 100 units/mL injection Inject 4-8 Units into the skin 3 (three) times daily before meals. Sliding scale   Yes [provider]  metoprolol succinate (TOPROL-XL) 25 MG 24 hr tablet Take 25 mg by mouth daily. 06/07/18  Yes [provider]  PARoxetine (PAXIL) 10 MG tablet Take 10 mg by mouth 2 (two) times daily. 05/08/18  Yes [provider]  aspirin EC 81 MG tablet Take 1 tablet (81 mg total) by mouth daily. Patient not taking: Reported on 09/17/2016 08/11/13   Consuelo Pandy, PA-C    Current Facility-Administered Medications  Medication Dose Route Frequency Provider Last Rate Last Dose  . acetaminophen (TYLENOL) tablet 650 mg  650 mg Oral Q6H PRN Arrien, Jimmy Picket, MD      . amLODipine (NORVASC) tablet 5 mg  5 mg Oral Daily Collene Gobble, MD   5 mg at 06/14/18 0900  . insulin aspart (novoLOG) injection  0-15 Units  0-15 Units Subcutaneous TID WC Collene Gobble, MD   3 Units at 06/14/18 0901  . insulin aspart (novoLOG) injection 0-5 Units  0-5 Units Subcutaneous QHS Collene Gobble, MD   2 Units at 06/10/18 2154  . insulin glargine (LANTUS) injection 20 Units  20 Units Subcutaneous Daily Collene Gobble, MD   20 Units at 06/14/18 [phone]  . magnesium sulfate IVPB 2 g 50 mL  2 g Intravenous Once Arrien,  Jimmy Picket, MD      . MEDLINE mouth rinse  15 mL Mouth Rinse BID Collene Gobble, MD   15 mL at 06/13/18 1040  . metoprolol succinate (TOPROL-XL) 24 hr tablet 25 mg  25 mg Oral Daily Collene Gobble, MD   25 mg at 06/14/18 0858  . morphine 2 MG/ML injection 1 mg  1 mg Intravenous Q2H PRN Arrien, Jimmy Picket, MD   1 mg at 06/13/18 2213  . multivitamin with minerals tablet 1 tablet  1 tablet Oral Daily Collene Gobble, MD   1 tablet at 06/14/18 0900  . nicotine (NICODERM CQ - dosed in mg/24 hours) patch 21 mg  21 mg Transdermal Daily Collene Gobble, MD      . ondansetron [facility] injection 4 mg  4 mg Intravenous Q4H PRN Arrien, Jimmy Picket, MD   4 mg at 06/13/18 1536  . pantoprazole (PROTONIX) EC tablet 40 mg  40 mg Oral BID Arrien, Jimmy Picket, MD   40 mg at 06/14/18 0900  . PARoxetine (PAXIL) tablet 10 mg  10 mg Oral BID Collene Gobble, MD   10 mg at 06/14/18 0909  . pneumococcal 23 valent vaccine (PNU-IMMUNE) injection 0.5 mL  0.5 mL Intramuscular Tomorrow-1000 Byrum, Rose Fillers, MD      . polyethylene glycol (MIRALAX / GLYCOLAX) packet 17 g  17 g Oral BID Tawni Millers, MD   17 g at 06/13/18 2036  . potassium chloride SA (K-DUR,KLOR-CON) CR tablet 40 mEq  40 mEq Oral Q4H Arrien, Jimmy Picket, MD      . promethazine [facility]) injection 12.5 mg  12.5 mg Intravenous Q6H PRN Collene Gobble, MD   12.5 mg at 06/13/18 2213  . sucralfate (CARAFATE) 1 GM/10ML suspension 1 g  1 g Oral TID WC & HS Arrien, Jimmy Picket, MD   1 g at 06/14/18 0900  . traZODone (DESYREL) tablet 100 mg  100 mg Oral QHS PRN Chesley Mires, MD   100 mg at 06/11/18 2233    Allergies as of 06/09/2018 - Review Complete 06/09/2018  Allergen Reaction Noted  . Tylox [oxycodone-acetaminophen] Nausea And Vomiting 08/05/2013    Family History  Problem Relation Age of Onset  . Diabetes Mother   . Hypertension Mother   . Hypertension Father   . Hyperlipidemia Father   . Alcohol abuse Brother   .  Brain cancer Unknown        grandparent  . Stomach cancer Unknown        grandparent    Social History   Socioeconomic History  . Marital status: Married    Spouse name: Not on file  . Number of children: 2  . Years of education: college  . Highest education level: Not on file  Occupational History  . Occupation: disability  Tobacco Use  . Smoking status: Current Every Day Smoker    Packs/day: 0.50    Years: 20.00    Pack years: 10.00  . Smokeless tobacco: Current User  Types: Chew  . Tobacco comment: 5-7 cigarettes/day; chews occasionally  Substance and Sexual Activity  . Alcohol use: No  . Drug use: No  . Sexual activity: Not on file  Social History Narrative   Patient drinks 1 cup of caffeine daily.   Patient is right handed.     Review of Systems: As per HPI, otherwise negative  Physical Exam: Vital signs in last 24 hours: Temp:  [98.1 F (36.7 C)-98.9 F (37.2 C)] 98.1 F (36.7 C) (07/20 0341) Pulse Rate:  [95-101] 95 (07/20 0341) Resp:  [18] 18 (07/19 2300) BP: (128-162)/(82-104) 162/102 (07/20 0341) SpO2:  [99 %-100 %] 99 % (07/20 0341) Last BM Date: 06/07/18 Gen: awake, alert, NAD HEENT: anicteric, op clear CV: RRR, no mrg Pulm: CTA b/l Abd: soft, NT/ND, +BS throughout Ext: no c/c/e Neuro: nonfocal, no asterixis Psych: Normal mood and affect   Intake/Output from previous day: 07/19 0701 - 07/20 0700 In: 240 [P.O.:240] Out: 952 [Urine:950; Stool:2] Intake/Output this shift: No intake/output data recorded.  Lab Results: Recent Labs    06/12/18 0610  WBC 10.6*  HGB 15.0  HCT 43.7  PLT 181   BMET Recent Labs    06/12/18 0610 06/13/18 0354 06/14/18 0552  NA 140 138 138  K 3.0* 3.0* 3.2*  CL 103 103 104  CO2 24 24 22   GLUCOSE 195* 167* 146*  BUN 5* 6 6  CREATININE 0.69 0.58* 0.61  CALCIUM 8.4* 8.3* 8.4*     Studies/Results: No results found.  IMPRESSION:  47 y.o. male with a history of diabetes with neuropathy,  hypertension, dyslipidemia, history of alcohol and tobacco abuse, history of inactive hepatitis B (core positive, surface antigen negative), history of depression admitted with DKA complicated by acute kidney injury now with ongoing epigastric abdominal pain, early satiety and nausea.  Recent hematemesis  PLAN: 1.  Epigastric pain/nausea with vomiting/recent hematemesis/early satiety --M times most consistent with diabetic gastroparesis.  He may have had a Mallory-Weiss tear while he was in DKA which led to the coffee-ground emesis.  Cannot exclude gastritis or even ulcer disease. --EGD recommended tomorrow with monitored anesthesia care.  N.p.o. after midnight. --Continue twice daily oral PPI --Continue Carafate slurry 1 g 3 times daily before meals and at bedtime --Avoid NSAIDs.  He seems to be committed to no alcohol which certainly will also help his stomach --High suspicion for gastroparesis, may need Reglan temporarily --K needs to be fully supplemented  The nature of the procedure, as well as the risks, benefits, and alternatives were carefully and thoroughly reviewed with the patient. Ample time for discussion and questions allowed. The patient understood, was satisfied, and agreed to proceed.      Grant [name] Breena Bevacqua  06/14/2018, 11:46 AM

## 2018-06-14 NOTE — Progress Notes (Signed)
PROGRESS NOTE    STEVE YOUNGBERG  JJH:[id number] DOB: 10-07-1971 DOA: 06/09/2018 PCP: [name], MD    Brief Narrative:  47 year old male who presented with nausea, vomitingandhematemesis. He does have thesignificant past medical history for hypertension, dyslipidemia, type 2 diabetes mellitus, depression, peripheral neuropathy, chronic back pain, alcohol tobacco abuse. Patient reported 3-day history of nausea, and vomiting, complicated by coffee-ground emesis, abdominal distention and poor oral intake. On his initial physical examination blood pressure 161/117, heart rate 145, respirate 25, oxygen saturation 100%, dry mucous membranes, lungs clear to auscultation bilaterally, heart S1-S2 present, tachycardic, abdomen was distended, nontender, no lower extremity edema. 134, potassium 3.9, chloride 98, bicarb 10, glucose 483, creatinine 1.52, anion gap 26,white cell count 24, hemoglobin 19.1, hematocrit 56.7, platelets 324,urinalysis 30 protein, specific gravity 1.023, creatinine 500 glucose, 0-5redcells, 0-5 white cells. Urine drug testing positive for benzodiazepines and tetrahydrocannabinol. Chest x-ray negative for infiltrates.  Patientwas admitted to the stepdown unit with a working diagnosis of diabetes ketoacidosis, complicated by acute kidney injury and hematemesis.  7/15.Patient developed hallucinations, altered mentation, required transfer to intensive care unit, he received lytics infusion as needed benzodiazepineper CIWAprotocol.   He was transferred to the medical floor on August 18.   Assessment & Plan:   Principal Problem:   DKA (diabetic ketoacidoses) ([place]) Active Problems:   Essential hypertension   ALCOHOL ABUSE, HX OF   Dehydration   Nausea and vomiting   Hypercalcemia   SIRS (systemic inflammatory response syndrome) ([facility])   Hematemesis   Type 2 diabetes mellitus with peripheral neuropathy ([place])   1. T2DM with resolved DKA. Tolerating well  current insulin regimen with 20 units on lantus and insulin sliding scale for glucose cover and monitoring, Capillary glucose monitoring 162, 166, 141, 162, 142. Poor oral intake.    2. Suspected gastritiswith Mallory Weis tear. Persistent abdominal pain despite antiacid therapy with bid pantoprazole and sucralfate qid. Will change to clear liquid diet. Consulted Dr. [name] for possible diagnostic endoscopy. Continue as needed antiemetics.   3.Pre- renal AKI withHypokalemia. Persistent hypokalemia, continue serum K correction with kcl. Mg at 1.8, will give IV Mg sulfate. Preserved renal function, follow renal panel in am.   4. HTN. On metoprolol and amlodipine with adequate blood pressure control.  5. Depression, tobacco and alcohol abuse. Continue  paroxetine and trazodone.   DVT prophylaxis:enoxaparin Code Status:full Family Communication:no family at the beside Disposition Plan/ discharge barriers: Pending improvement of abdominal pain.   Consultants:    Procedures:    Antimicrobials:      Subjective: Patient with persistent nausea and vomiting, associated with abdominal pain and reflux, only mild relief with antiacids. Had bowel movement yesterday.   Objective: Vitals:   06/13/18 2007 06/13/18 2300 06/14/18 0100 06/14/18 0341  BP: (!) 146/88 (!) 147/104 128/82 (!) 162/102  Pulse: (!) 101 (!) 101  95  Resp:  18    Temp: 98.5 F (36.9 C) 98.9 F (37.2 C)  98.1 F (36.7 C)  TempSrc: Oral Oral  Oral  SpO2: 99% 100%  99%  Weight:      Height:        Intake/Output Summary (Last 24 hours) at 06/14/2018 1108 Last data filed at 06/14/2018 0100 Gross per 24 hour  Intake -  Output 652 ml  Net -652 ml   Filed Weights   06/09/18 1700 06/10/18 0428 06/11/18 1537  Weight: 101.1 kg (222 lb 14.2 oz) 101.6 kg (223 lb 15.8 oz) 105.2 kg (231 lb 14.8 oz)  Examination:   General: Not in pain or dyspnea, deconditioned  Neurology: Awake and alert,  non focal  E ENT: mild pallor, no icterus, oral mucosa moist Cardiovascular: No JVD. S1-S2 present, rhythmic, no gallops, rubs, or murmurs. No lower extremity edema. Pulmonary: vesicular breath sounds bilaterally, adequate air movement, no wheezing, rhonchi or rales. Gastrointestinal. Abdomen mild distended with no organomegaly, non tender, no rebound or guarding Skin. No rashes Musculoskeletal: no joint deformities     Data Reviewed: I have personally reviewed following labs and imaging studies  CBC: Recent Labs  Lab 06/09/18 0409  06/09/18 1941 06/10/18 0231 06/10/18 0442 06/11/18 0329 06/12/18 0610  WBC 24.4*   < > 25.6* 20.1* 19.0* 12.8* 10.6*  NEUTROABS 21.4*  --   --   --  15.7*  --   --   HGB 19.1*   < > 15.2 14.9 14.9 15.1 15.0  HCT 56.7*   < > 43.2 41.6 42.7 42.7 43.7  MCV 91.5   < > 88.7 87.2 87.7 87.3 88.5  PLT 324   < > 211 188 187 163 181   < > = values in this interval not displayed.   Basic Metabolic Panel: Recent Labs  Lab 06/10/18 0442  06/10/18 1409 06/11/18 0329 06/12/18 0610 06/13/18 0354 06/14/18 0552  NA 141   < > 139 139 140 138 138  K 2.7*   < > 2.9* 3.2* 3.0* 3.0* 3.2*  CL 117*   < > 114* 109 103 103 104  CO2 17*   < > 18* 19* 24 24 22   GLUCOSE 92   < > 108* 219* 195* 167* 146*  BUN 10   < > 10 6 5* 6 6  CREATININE 0.74   < > 0.67 0.70 0.69 0.58* 0.61  CALCIUM 8.5*   < > 8.2* 8.5* 8.4* 8.3* 8.4*  MG 1.8  --   --  1.8 2.1  --  1.8  PHOS <1.0*  --  1.9* 1.2*  --   --   --    < > = values in this interval not displayed.   GFR: Estimated Creatinine Clearance: 139.4 mL/min (by C-G formula based on SCr of 0.61 mg/dL). Liver Function Tests: Recent Labs  Lab 06/10/18 0442  AST 34  ALT 34  ALKPHOS 60  BILITOT 1.3*  PROT 5.9*  ALBUMIN 3.4*   Recent Labs  Lab 06/09/18 0713  LIPASE 123*   No results for input(s): AMMONIA in the last 168 hours. Coagulation Profile: Recent Labs  Lab 06/09/18 0713 06/10/18 0442 06/11/18 0329  INR  1.31 1.30 1.20   Cardiac Enzymes: Recent Labs  Lab 06/09/18 1642 06/09/18 2200 06/10/18 0442  TROPONINI 0.05* 0.04* 0.13*   BNP (last 3 results) No results for input(s): PROBNP in the last 8760 hours. HbA1C: No results for input(s): HGBA1C in the last 72 hours. CBG: Recent Labs  Lab 06/12/18 2103 06/13/18 0812 06/13/18 1213 06/13/18 1721 06/13/18 2049  GLUCAP 169* 162* 166* 141* 162*   Lipid Profile: No results for input(s): CHOL, HDL, LDLCALC, TRIG, CHOLHDL, LDLDIRECT in the last 72 hours. Thyroid Function Tests: No results for input(s): TSH, T4TOTAL, FREET4, T3FREE, THYROIDAB in the last 72 hours. Anemia Panel: No results for input(s): VITAMINB12, FOLATE, FERRITIN, TIBC, IRON, RETICCTPCT in the last 72 hours.    Radiology Studies: I have reviewed all of the imaging during this hospital visit personally     Scheduled Meds: . amLODipine  5 mg Oral Daily  . insulin  aspart  0-15 Units Subcutaneous TID WC  . insulin aspart  0-5 Units Subcutaneous QHS  . insulin glargine  20 Units Subcutaneous Daily  . mouth rinse  15 mL Mouth Rinse BID  . metoprolol succinate  25 mg Oral Daily  . multivitamin with minerals  1 tablet Oral Daily  . nicotine  21 mg Transdermal Daily  . pantoprazole  40 mg Oral BID  . PARoxetine  10 mg Oral BID  . pneumococcal 23 valent vaccine  0.5 mL Intramuscular Tomorrow-1000  . polyethylene glycol  17 g Oral BID  . potassium chloride  40 mEq Oral Q4H  . sucralfate  1 g Oral TID WC & HS   Continuous Infusions: . magnesium sulfate 1 - 4 g bolus IVPB       LOS: 5 days        Mauricio Gerome Apley, MD [facility] Pager [phone]

## 2018-06-15 ENCOUNTER — Encounter (HOSPITAL_COMMUNITY): Payer: Self-pay | Admitting: *Deleted

## 2018-06-15 ENCOUNTER — Inpatient Hospital Stay (HOSPITAL_COMMUNITY): Payer: Self-pay | Admitting: Certified Registered Nurse Anesthetist

## 2018-06-15 ENCOUNTER — Encounter (HOSPITAL_COMMUNITY)
Admission: EM | Disposition: A | Discharge status: Home / Self Care | Payer: Self-pay | Source: Home / Self Care | Attending: Internal Medicine

## 2018-06-15 DIAGNOSIS — K209 Esophagitis, unspecified: Secondary | ICD-10-CM

## 2018-06-15 DIAGNOSIS — K208 Other esophagitis without bleeding: Secondary | ICD-10-CM

## 2018-06-15 DIAGNOSIS — R6881 Early satiety: Secondary | ICD-10-CM

## 2018-06-15 DIAGNOSIS — K210 Gastro-esophageal reflux disease with esophagitis: Secondary | ICD-10-CM

## 2018-06-15 HISTORY — PX: ESOPHAGOGASTRODUODENOSCOPY (EGD) WITH PROPOFOL: SHX5813

## 2018-06-15 HISTORY — PX: BIOPSY: SHX5522

## 2018-06-15 LAB — BASIC METABOLIC PANEL
Anion gap: 11 (ref 5–15)
BUN: 7 mg/dL (ref 6–20)
CHLORIDE: 102 mmol/L (ref 98–111)
CO2: 25 mmol/L (ref 22–32)
CREATININE: 0.64 mg/dL (ref 0.61–1.24)
Calcium: 8.3 mg/dL — ABNORMAL LOW (ref 8.9–10.3)
GFR calc Af Amer: >60 mL/min (ref >60)
GFR calc non Af Amer: >60 mL/min (ref >60)
Glucose, Bld: 130 mg/dL — ABNORMAL HIGH (ref 70–99)
Potassium: 5.1 mmol/L (ref 3.5–5.1)
SODIUM: 138 mmol/L (ref 135–145)

## 2018-06-15 LAB — GLUCOSE, CAPILLARY
GLUCOSE-CAPILLARY: 208 mg/dL — AB (ref 70–99)
Glucose-Capillary: 132 mg/dL — ABNORMAL HIGH (ref 70–99)
Glucose-Capillary: 236 mg/dL — ABNORMAL HIGH (ref 70–99)
Glucose-Capillary: 221 mg/dL — ABNORMAL HIGH (ref 70–99)

## 2018-06-15 LAB — MAGNESIUM: MAGNESIUM: 2.3 mg/dL (ref 1.7–2.4)

## 2018-06-15 SURGERY — ESOPHAGOGASTRODUODENOSCOPY (EGD) WITH PROPOFOL
Anesthesia: Monitor Anesthesia Care

## 2018-06-15 MED ORDER — SODIUM CHLORIDE 0.9 % IV SOLN: INTRAVENOUS | Status: DC

## 2018-06-15 MED ORDER — HYDRALAZINE HCL 20 MG/ML IJ SOLN
10.0000 mg | INTRAMUSCULAR | Status: DC | PRN
  Administered 2018-06-15: 10 mg via INTRAVENOUS
  Filled 2018-06-15: qty 1

## 2018-06-15 MED ORDER — PROPOFOL 500 MG/50ML IV EMUL
INTRAVENOUS | Status: DC | PRN
  Administered 2018-06-15: 75 ug/kg/min via INTRAVENOUS

## 2018-06-15 MED ORDER — METOCLOPRAMIDE HCL 10 MG PO TABS
10.0000 mg | ORAL_TABLET | Freq: Three times a day (TID) | ORAL | Status: DC
  Administered 2018-06-15 – 2018-06-17 (×8): 10 mg via ORAL
  Filled 2018-06-15 (×8): qty 1

## 2018-06-15 MED ORDER — PROPOFOL 10 MG/ML IV BOLUS
INTRAVENOUS | Status: DC | PRN
  Administered 2018-06-15 (×4): 20 mg via INTRAVENOUS

## 2018-06-15 MED ORDER — LACTATED RINGERS IV SOLN
INTRAVENOUS | Status: DC
  Administered 2018-06-15: 08:00:00 via INTRAVENOUS

## 2018-06-15 MED ORDER — PANTOPRAZOLE SODIUM 40 MG PO TBEC
40.0000 mg | DELAYED_RELEASE_TABLET | Freq: Two times a day (BID) | ORAL | Status: DC
  Administered 2018-06-15 – 2018-06-17 (×5): 40 mg via ORAL
  Filled 2018-06-15 (×5): qty 1

## 2018-06-15 MED ORDER — CEFAZOLIN SODIUM-DEXTROSE 2-4 GM/100ML-% IV SOLN
INTRAVENOUS | Status: AC
  Filled 2018-06-15: qty 100

## 2018-06-15 MED ORDER — ONDANSETRON HCL 4 MG/2ML IJ SOLN
INTRAMUSCULAR | Status: AC
  Filled 2018-06-15: qty 2

## 2018-06-15 SURGICAL SUPPLY — 15 items

## 2018-06-15 NOTE — Transfer of Care (Signed)
Immediate Anesthesia Transfer of Care Note  Patient: Grant [name]  Procedure(s) Performed: ESOPHAGOGASTRODUODENOSCOPY (EGD) WITH PROPOFOL (N/A ) BIOPSY  Patient Location: Endoscopy Unit  Anesthesia Type:MAC  Level of Consciousness: drowsy and patient cooperative  Airway & Oxygen Therapy: Patient Spontanous Breathing  Post-op Assessment: Report given to RN, Post -op Vital signs reviewed and stable and Patient moving all extremities X 4  Post vital signs: Reviewed and stable  Last Vitals:  Vitals Value Taken Time  BP 155/108 06/15/2018  9:02 AM  Temp 37.1 C 06/15/2018  9:02 AM  Pulse 85 06/15/2018  9:02 AM  Resp 14 06/15/2018  9:02 AM  SpO2 97 % 06/15/2018  9:02 AM    Last Pain:  Vitals:   06/15/18 0902  TempSrc: Oral  PainSc: 0-No pain      Patients Stated Pain Goal: 0 (16/10/96 0454)  Complications: No apparent anesthesia complications

## 2018-06-15 NOTE — Anesthesia Preprocedure Evaluation (Addendum)
Anesthesia Evaluation  Patient identified by MRN, date of birth, ID band Patient awake    Reviewed: Allergy & Precautions, H&P , NPO status , Patient's Chart, lab work & pertinent test results  Airway Mallampati: II   Neck ROM: full    Dental   Pulmonary Current Smoker,    breath sounds clear to auscultation       Cardiovascular hypertension,  Rhythm:regular Rate:Normal     Neuro/Psych Anxiety  Neuromuscular disease    GI/Hepatic (+)     substance abuse  alcohol use, Coffee ground emesis   Endo/Other  diabetes, Type 2, Insulin DependentAdmitted with DKA  Renal/GU ARFRenal disease     Musculoskeletal   Abdominal   Peds  Hematology   Anesthesia Other Findings   Reproductive/Obstetrics                            Anesthesia Physical Anesthesia Plan  ASA: II  Anesthesia Plan: MAC   Post-op Pain Management:    Induction: Intravenous  PONV Risk Score and Plan: 0 and Propofol infusion, Ondansetron and Treatment may vary due to age or medical condition  Airway Management Planned: Nasal Cannula  Additional Equipment:   Intra-op Plan:   Post-operative Plan:   Informed Consent: I have reviewed the patients History and Physical, chart, labs and discussed the procedure including the risks, benefits and alternatives for the proposed anesthesia with the patient or authorized representative who has indicated his/her understanding and acceptance.     Plan Discussed with: CRNA, Anesthesiologist and Surgeon  Anesthesia Plan Comments:         Anesthesia Quick Evaluation

## 2018-06-15 NOTE — Interval H&P Note (Signed)
History and Physical Interval Note: For EGD today to eval n/v and recent coffee ground emesis The nature of the procedure, as well as the risks, benefits, and alternatives were carefully and thoroughly reviewed with the patient. Ample time for discussion and questions allowed. The patient understood, was satisfied, and agreed to proceed.     06/15/2018 8:30 AM  Grant [name]  has presented today for surgery, with the diagnosis of Coffee-ground emesis, nausea and vomiting, upper abdominal pain  The various methods of treatment have been discussed with the patient and family. After consideration of risks, benefits and other options for treatment, the patient has consented to  Procedure(s): ESOPHAGOGASTRODUODENOSCOPY (EGD) WITH PROPOFOL (N/A) as a surgical intervention .  The patient's history has been reviewed, patient examined, no change in status, stable for surgery.  I have reviewed the patient's chart and labs.  Questions were answered to the patient's satisfaction.     [name]

## 2018-06-15 NOTE — Anesthesia Postprocedure Evaluation (Signed)
Anesthesia Post Note  Patient: Grant [name]  Procedure(s) Performed: ESOPHAGOGASTRODUODENOSCOPY (EGD) WITH PROPOFOL (N/A ) BIOPSY     Patient location during evaluation: PACU Anesthesia Type: MAC Level of consciousness: awake and alert Pain management: pain level controlled Vital Signs Assessment: post-procedure vital signs reviewed and stable Respiratory status: spontaneous breathing, nonlabored ventilation, respiratory function stable and patient connected to nasal cannula oxygen Cardiovascular status: stable and blood pressure returned to baseline Postop Assessment: no apparent nausea or vomiting Anesthetic complications: no    Last Vitals:  Vitals:   06/15/18 0902 06/15/18 0905  BP: (!) 155/108 (!) 152/93  Pulse: 85 84  Resp: 14 (!) 22  Temp: 37.1 C   SpO2: 97% 98%    Last Pain:  Vitals:   06/15/18 0905  TempSrc:   PainSc: [place]

## 2018-06-15 NOTE — Progress Notes (Signed)
PROGRESS NOTE    Grant [name]  KXF:[id number] DOB: 11-12-1971 DOA: 06/09/2018 PCP: [name], MD    Brief Narrative:  47 year old male who presented with nausea, vomitingandhematemesis. He does have thesignificant past medical history for hypertension, dyslipidemia, type 2 diabetes mellitus, depression, peripheral neuropathy, chronic back pain, alcohol tobacco abuse. Patient reported 3-day history of nausea, and vomiting, complicated by coffee-ground emesis, abdominal distention and poor oral intake. On his initial physical examination blood pressure 161/117, heart rate 145, respirate 25, oxygen saturation 100%, dry mucous membranes, lungs clear to auscultation bilaterally, heart S1-S2 present, tachycardic, abdomen was distended, nontender, no lower extremity edema. 134, potassium 3.9, chloride 98, bicarb 10, glucose 483, creatinine 1.52, anion gap 26,white cell count 24, hemoglobin 19.1, hematocrit 56.7, platelets 324,urinalysis 30 protein, specific gravity 1.023, creatinine 500 glucose, 0-5redcells, 0-5 white cells. Urine drug testing positive for benzodiazepines and tetrahydrocannabinol. Chest x-ray negative for infiltrates.  Patientwas admitted to the stepdown unit with a working diagnosis of diabetes ketoacidosis, complicated by acute kidney injury and hematemesis.  7/15.Patient developed hallucinations, altered mentation, required transfer to intensive care unit, he received lytics infusion as needed benzodiazepineper CIWAprotocol.   He was transferred to the medical floor on August 18.   Assessment & Plan:   Principal Problem:   DKA (diabetic ketoacidoses) ([place]) Active Problems:   Essential hypertension   ALCOHOL ABUSE, HX OF   Dehydration   Nausea and vomiting   Hypercalcemia   SIRS (systemic inflammatory response syndrome) ([facility])   Hematemesis   Type 2 diabetes mellitus with peripheral neuropathy ([facility])   Early satiety   Acute  esophagitis   1.T2DM with resolved DKA. Continue current insulin regimen with 20 units on lantus and insulin sliding scale for glucose cover and monitoring, Capillary glucose 144, 112, 198, 132, 236. Plan to discharge on lantus and regular insulin. Patient with no medical insurance.   2. Acute ulcerative and erosive esophagitis. Patient underwent diagnostic upper endoscopy today finding erosive and ulcerative esophagitis, follow on biopsies. Will continue antiacid therapy with pantoprazole bid, sucralfate qid and added metoclopramide before meals to prevent reflux related to gastroparesis. Advance diet as tolerated.   3.Pre- renal AKI withHypokalemia. Stable renal function and electrolytes, serum k has been corrected, follow renal panel in am. Mg at 2,3 and K 5,1.   4. HTN. Continue metoprolol and amlodipine for blood pressure control.  5. Depression, tobacco and alcohol abuse. On paroxetine and trazodone.   DVT prophylaxis:enoxaparin Code Status:full Family Communication:no family at the beside Disposition Plan/ discharge barriers: Pending improvement of abdominal pain.   Consultants:    Procedures:    Antimicrobials:     Subjective: Patient had endoscopy today, finding esophagitis, improved abdominal pain and po intake, no nausea or vomiting.   Objective: Vitals:   06/15/18 0428 06/15/18 0800 06/15/18 0902 06/15/18 0905  BP: 100/76 (!) 146/86 (!) 155/108 (!) 152/93  Pulse: 80 84 85 84  Resp: 17 10 14  (!) 22  Temp: 97.7 F (36.5 C) 98.2 F (36.8 C) 98.7 F (37.1 C)   TempSrc: Oral Oral Oral   SpO2: 96% 100% 97% 98%  Weight:      Height:        Intake/Output Summary (Last 24 hours) at 06/15/2018 1031 Last data filed at 06/15/2018 0856 Gross per 24 hour  Intake 650 ml  Output -  Net 650 ml   Filed Weights   06/09/18 1700 06/10/18 0428 06/11/18 1537  Weight: 101.1 kg (222 lb 14.2 oz) 101.6 kg (223  lb 15.8 oz) 105.2 kg (231 lb 14.8 oz)     Examination:   General: Not in pain or dyspnea, deconditioned  Neurology: Awake and alert, non focal  E ENT: mid pallor, no icterus, oral mucosa moist Cardiovascular: No JVD. S1-S2 present, rhythmic, no gallops, rubs, or murmurs. No lower extremity edema. Pulmonary: vesicular breath sounds bilaterally, adequate air movement, no wheezing, rhonchi or rales. Gastrointestinal. Abdomen with no organomegaly, non tender, no rebound or guarding Skin. No rashes Musculoskeletal: no joint deformities     Data Reviewed: I have personally reviewed following labs and imaging studies  CBC: Recent Labs  Lab 06/09/18 0409  06/09/18 1941 06/10/18 0231 06/10/18 0442 06/11/18 0329 06/12/18 0610  WBC 24.4*   < > 25.6* 20.1* 19.0* 12.8* 10.6*  NEUTROABS 21.4*  --   --   --  15.7*  --   --   HGB 19.1*   < > 15.2 14.9 14.9 15.1 15.0  HCT 56.7*   < > 43.2 41.6 42.7 42.7 43.7  MCV 91.5   < > 88.7 87.2 87.7 87.3 88.5  PLT 324   < > 211 188 187 163 181   < > = values in this interval not displayed.   Basic Metabolic Panel: Recent Labs  Lab 06/10/18 0442  06/10/18 1409 06/11/18 0329 06/12/18 0610 06/13/18 0354 06/14/18 0552 06/15/18 0655  NA 141   < > 139 139 140 138 138 138  K 2.7*   < > 2.9* 3.2* 3.0* 3.0* 3.2* 5.1  CL 117*   < > 114* 109 103 103 104 102  CO2 17*   < > 18* 19* 24 24 22 25   GLUCOSE 92   < > 108* 219* 195* 167* 146* 130*  BUN 10   < > 10 6 5* 6 6 7   CREATININE 0.74   < > 0.67 0.70 0.69 0.58* 0.61 0.64  CALCIUM 8.5*   < > 8.2* 8.5* 8.4* 8.3* 8.4* 8.3*  MG 1.8  --   --  1.8 2.1  --  1.8 2.3  PHOS <1.0*  --  1.9* 1.2*  --   --   --   --    < > = values in this interval not displayed.   GFR: Estimated Creatinine Clearance: 139.4 mL/min (by C-G formula based on SCr of 0.64 mg/dL). Liver Function Tests: Recent Labs  Lab 06/10/18 0442  AST 34  ALT 34  ALKPHOS 60  BILITOT 1.3*  PROT 5.9*  ALBUMIN 3.4*   Recent Labs  Lab 06/09/18 0713  LIPASE 123*   No  results for input(s): AMMONIA in the last 168 hours. Coagulation Profile: Recent Labs  Lab 06/09/18 0713 06/10/18 0442 06/11/18 0329  INR 1.31 1.30 1.20   Cardiac Enzymes: Recent Labs  Lab 06/09/18 1642 06/09/18 2200 06/10/18 0442  TROPONINI 0.05* 0.04* 0.13*   BNP (last 3 results) No results for input(s): PROBNP in the last 8760 hours. HbA1C: No results for input(s): HGBA1C in the last 72 hours. CBG: Recent Labs  Lab 06/14/18 0801 06/14/18 1235 06/14/18 1807 06/14/18 2208 06/15/18 0820  GLUCAP 142* 144* 112* 198* 132*   Lipid Profile: No results for input(s): CHOL, HDL, LDLCALC, TRIG, CHOLHDL, LDLDIRECT in the last 72 hours. Thyroid Function Tests: No results for input(s): TSH, T4TOTAL, FREET4, T3FREE, THYROIDAB in the last 72 hours. Anemia Panel: No results for input(s): VITAMINB12, FOLATE, FERRITIN, TIBC, IRON, RETICCTPCT in the last 72 hours.    Radiology Studies: I have reviewed all  of the imaging during this hospital visit personally     Scheduled Meds: . amLODipine  5 mg Oral Daily  . insulin aspart  0-15 Units Subcutaneous TID WC  . insulin aspart  0-5 Units Subcutaneous QHS  . insulin glargine  20 Units Subcutaneous Daily  . mouth rinse  15 mL Mouth Rinse BID  . metoCLOPramide  10 mg Oral TID AC & HS  . metoprolol succinate  25 mg Oral Daily  . multivitamin with minerals  1 tablet Oral Daily  . nicotine  21 mg Transdermal Daily  . pantoprazole  40 mg Oral BID AC  . PARoxetine  10 mg Oral BID  . pneumococcal 23 valent vaccine  0.5 mL Intramuscular Tomorrow-1000  . polyethylene glycol  17 g Oral BID  . sucralfate  1 g Oral TID WC & HS   Continuous Infusions:   LOS: 6 days        Mauricio Gerome Apley, MD [facility] Pager [phone]

## 2018-06-15 NOTE — Op Note (Signed)
[facility] Patient Name: Grant [name] Procedure Date : 06/15/2018 MRN: [id number] Attending MD: Jerene Bears , MD Date of Birth: Nov 15, 1971 CSN: [id number] Age: 47 Admit Type: Inpatient Procedure:                Upper GI endoscopy Indications:              Epigastric abdominal pain, Coffee-ground emesis,                            Early satiety, Nausea with vomiting Providers:                Lajuan Lines. [name], MD, Zenon Mayo, RN, Nevin Bloodgood, Technician, Wyatt Haste Referring MD:             [facility] Medicines:                Monitored Anesthesia Care Complications:            No immediate complications. Estimated Blood Loss:     Estimated blood loss was minimal. Procedure:                Pre-Anesthesia Assessment:                           - Prior to the procedure, a History and Physical                            was performed, and patient medications and                            allergies were reviewed. The patient's tolerance of                            previous anesthesia was also reviewed. The risks                            and benefits of the procedure and the sedation                            options and risks were discussed with the patient.                            All questions were answered, and informed consent                            was obtained. Prior Anticoagulants: The patient has                            taken no previous anticoagulant or antiplatelet                            agents. ASA Grade Assessment: III - A patient with  severe systemic disease. After reviewing the risks                            and benefits, the patient was deemed in                            satisfactory condition to undergo the procedure.                           After obtaining informed consent, the endoscope was                            passed under direct vision. Throughout the                             procedure, the patient's blood pressure, pulse, and                            oxygen saturations were monitored continuously. The                            EG-[id number] ([id number]) scope was introduced through the                            mouth, and advanced to the second part of duodenum.                            The upper GI endoscopy was accomplished without                            difficulty. The patient tolerated the procedure                            well. Images obtained failed to capture due to                            processor malfunction. Scope In: Scope Out: Findings:      LA Grade D (one or more mucosal breaks involving at least 75% of       esophageal circumference) esophagitis was found 30 to 40 cm from the       incisors. There was clean-based ulceration in the distal esophagus at GE       junction and erosive esophagitis proximally to 30 cm. GE junction is at       40 cm. Biopsies were taken with a cold forceps for histology.      The cardia and gastric fundus were normal on retroflexion.      The entire examined stomach was normal. Biopsies were taken with a cold       forceps for histology and Helicobacter pylori testing.      The examined duodenum was normal. Impression:               - LA Grade D reflux, acute ulceration/erosive  esophagitis. Biopsied.                           - Normal stomach. Biopsied.                           - Normal examined duodenum. Moderate Sedation:      N/A Recommendation:           - Return patient to hospital ward for ongoing care.                           - Low residue diet (gastroparesis diet).                           - Continue present medications.                           - BID oral PPI x 12 weeks.                           - Liquid sucralfate 1 g TID-AC and HS until                            symptoms resolve.                           - Reglan 10 mg TID-AC and HS  for about 2 weeks (to                            help gastroparesis and help prevent reflux).                           - Await pathology results.                           - Repeat upper endoscopy in 3 months to check                            healing and to evaluate the response to therapy.                           - No alcohol or NSAIDs. Procedure Code(s):        --- Professional ---                           [phone], Esophagogastroduodenoscopy, flexible,                            transoral; with biopsy, single or multiple Diagnosis Code(s):        --- Professional ---                           K21.0, Gastro-esophageal reflux disease with                            esophagitis  K20.8, Other esophagitis                           R10.13, Epigastric pain                           K92.0, Hematemesis                           R68.81, Early satiety                           R11.2, Nausea with vomiting, unspecified CPT copyright 2017 [employer]. All rights reserved. The codes documented in this report are preliminary and upon coder review may  be revised to meet current compliance requirements. Jerene Bears, MD 06/15/2018 9:03:40 AM This report has been signed electronically. Number of Addenda: 0

## 2018-06-16 ENCOUNTER — Encounter: Payer: Self-pay | Admitting: Gastroenterology

## 2018-06-16 ENCOUNTER — Encounter (HOSPITAL_COMMUNITY): Payer: Self-pay | Admitting: Internal Medicine

## 2018-06-16 DIAGNOSIS — K2210 Ulcer of esophagus without bleeding: Secondary | ICD-10-CM

## 2018-06-16 LAB — BASIC METABOLIC PANEL
ANION GAP: 10 (ref 5–15)
BUN: <5 mg/dL — ABNORMAL LOW (ref 6–20)
CO2: 28 mmol/L (ref 22–32)
Calcium: 8.6 mg/dL — ABNORMAL LOW (ref 8.9–10.3)
Chloride: 104 mmol/L (ref 98–111)
Creatinine, Ser: 0.62 mg/dL (ref 0.61–1.24)
GFR calc non Af Amer: >60 mL/min (ref >60)
Glucose, Bld: 144 mg/dL — ABNORMAL HIGH (ref 70–99)
Potassium: 2.8 mmol/L — ABNORMAL LOW (ref 3.5–5.1)
Sodium: 142 mmol/L (ref 135–145)

## 2018-06-16 LAB — POTASSIUM: POTASSIUM: 3.1 mmol/L — AB (ref 3.5–5.1)

## 2018-06-16 LAB — GLUCOSE, CAPILLARY
GLUCOSE-CAPILLARY: 185 mg/dL — AB (ref 70–99)
Glucose-Capillary: 154 mg/dL — ABNORMAL HIGH (ref 70–99)
Glucose-Capillary: 220 mg/dL — ABNORMAL HIGH (ref 70–99)
Glucose-Capillary: 174 mg/dL — ABNORMAL HIGH (ref 70–99)

## 2018-06-16 MED ORDER — POTASSIUM CHLORIDE CRYS ER 20 MEQ PO TBCR
40.0000 meq | EXTENDED_RELEASE_TABLET | Freq: Once | ORAL | Status: AC
  Administered 2018-06-16: 40 meq via ORAL
  Filled 2018-06-16: qty 2

## 2018-06-16 MED ORDER — INSULIN GLARGINE 100 UNIT/ML ~~LOC~~ SOLN
25.0000 [IU] | Freq: Every day | SUBCUTANEOUS | Status: DC
  Administered 2018-06-17: 25 [IU] via SUBCUTANEOUS
  Filled 2018-06-16: qty 0.25

## 2018-06-16 NOTE — Progress Notes (Signed)
PROGRESS NOTE    Grant [name]  TYO:[id number] DOB: [birth year] DOA: 06/09/2018 PCP: [name], MD    Brief Narrative:  47 year old male who presented with nausea, vomitingandhematemesis. He does have thesignificant past medical history for hypertension, dyslipidemia, type 2 diabetes mellitus, depression, peripheral neuropathy, chronic back pain, alcohol tobacco abuse. Patient reported 3-day history of nausea, and vomiting, complicated by coffee-ground emesis, abdominal distention and poor oral intake. On his initial physical examination blood pressure 161/117, heart rate 145, respirate 25, oxygen saturation 100%, dry mucous membranes, lungs clear to auscultation bilaterally, heart S1-S2 present, tachycardic, abdomen was distended, nontender, no lower extremity edema. 134, potassium 3.9, chloride 98, bicarb 10, glucose 483, creatinine 1.52, anion gap 26,white cell count 24, hemoglobin 19.1, hematocrit 56.7, platelets 324,urinalysis 30 protein, specific gravity 1.023, creatinine 500 glucose, 0-5redcells, 0-5 white cells. Urine drug testing positive for benzodiazepines and tetrahydrocannabinol. Chest x-ray negative for infiltrates.  Patientwas admitted to the stepdown unit with a working diagnosis of diabetes ketoacidosis, complicated by acute kidney injury and hematemesis.  7/15.Patient developed hallucinations, altered mentation, required transfer to intensive care unit, he received lytics infusion as needed benzodiazepineper CIWAprotocol.   He was transferred to the medical floor on August 18.  Assessment & Plan:   Principal Problem:   DKA (diabetic ketoacidoses) ([place]) Active Problems:   Essential hypertension   ALCOHOL ABUSE, HX OF   Dehydration   Nausea and vomiting   Hypercalcemia   SIRS (systemic inflammatory response syndrome) (H[facility])   Hematemesis   Type 2 diabetes mellitus with peripheral neuropathy (HCC)   Early satiety   Acute esophagitis   1.T2DM  with resolved DKA.Capillary glucose now in the 200's. Had vomiting last night but improved po intake this am, will increase basal insulin to 25 units and will continue insulin sliding scale for glucose cover and monitoring.   2. Acute ulcerative and erosive esophagitis. Tolerating well antiacid therapy with pantoprazole bid, sucralfate qid, continue metoclopramide before meals. Will need outpatient follow up, avoid alcohol. Out of bed as tolerated and ambulate in the hall.   3.Pre- renal AKI withHypokalemia.Continue K correction with kcl, will give 40 meq now and will recheck this pm.  4. HTN.On metoprolol and amlodipine for blood pressure control, added as needed hydralazine for uncontrolled htn.   5. Depression, tobacco and alcohol abuse. Continue paroxetine plus trazodone.  DVT prophylaxis:enoxaparin Code Status:full Family Communication:no family at the beside Disposition Plan/ discharge barriers: Pending improvement of abdominal pain.   Consultants:    Procedures:    Antimicrobials:       Subjective: Patient feeling better but not back to baseline, had nausea and vomiting last night, improved symptoms this am. Improved abdominal pain.   Objective: Vitals:   06/16/18 0100 06/16/18 0547 06/16/18 1219 06/16/18 1421  BP: (!) 124/91 120/79 (!) 154/101 118/85  Pulse: 92 83 (!) 102 96  Resp: 16  18 18   Temp: 98.6 F (37 C) 97.6 F (36.4 C) 98.4 F (36.9 C) 98.2 F (36.8 C)  TempSrc: Oral Oral Oral Oral  SpO2: 99% 98% 98% 99%  Weight:      Height:        Intake/Output Summary (Last 24 hours) at 06/16/2018 1619 Last data filed at 06/16/2018 0900 Gross per 24 hour  Intake 240 ml  Output -  Net 240 ml   Filed Weights   06/09/18 1700 06/10/18 0428 06/11/18 1537  Weight: 101.1 kg (222 lb 14.2 oz) 101.6 kg (223 lb 15.8 oz) 105.2 kg (231 lb  14.8 oz)    Examination:   General: Not in pain or dyspnea, deconditioned  Neurology: Awake and  alert, non focal  E ENT: mild pallor, no icterus, oral mucosa moist Cardiovascular: No JVD. S1-S2 present, rhythmic, no gallops, rubs, or murmurs. No lower extremity edema. Pulmonary: vesicular breath sounds bilaterally, adequate air movement, no wheezing, rhonchi or rales. Gastrointestinal. Abdomen with mild distention but no organomegaly, non tender, no rebound or guarding Skin. No rashes Musculoskeletal: no joint deformities     Data Reviewed: I have personally reviewed following labs and imaging studies  CBC: Recent Labs  Lab 06/09/18 1941 06/10/18 0231 06/10/18 0442 06/11/18 0329 06/12/18 0610  WBC 25.6* 20.1* 19.0* 12.8* 10.6*  NEUTROABS  --   --  15.7*  --   --   HGB 15.2 14.9 14.9 15.1 15.0  HCT 43.2 41.6 42.7 42.7 43.7  MCV 88.7 87.2 87.7 87.3 88.5  PLT 211 188 187 163 756   Basic Metabolic Panel: Recent Labs  Lab 06/10/18 0442  06/10/18 1409 06/11/18 0329 06/12/18 0610 06/13/18 0354 06/14/18 0552 06/15/18 0655 06/16/18 0603 06/16/18 1324  NA 141   < > 139 139 140 138 138 138 142  --   K 2.7*   < > 2.9* 3.2* 3.0* 3.0* 3.2* 5.1 2.8* 3.1*  CL 117*   < > 114* 109 103 103 104 102 104  --   CO2 17*   < > 18* 19* 24 24 22 25 28   --   GLUCOSE 92   < > 108* 219* 195* 167* 146* 130* 144*  --   BUN 10   < > 10 6 5* 6 6 7  <5*  --   CREATININE 0.74   < > 0.67 0.70 0.69 0.58* 0.61 0.64 0.62  --   CALCIUM 8.5*   < > 8.2* 8.5* 8.4* 8.3* 8.4* 8.3* 8.6*  --   MG 1.8  --   --  1.8 2.1  --  1.8 2.3  --   --   PHOS <1.0*  --  1.9* 1.2*  --   --   --   --   --   --    < > = values in this interval not displayed.   GFR: Estimated Creatinine Clearance: 139.4 mL/min (by C-G formula based on SCr of 0.62 mg/dL). Liver Function Tests: Recent Labs  Lab 06/10/18 0442  AST 34  ALT 34  ALKPHOS 60  BILITOT 1.3*  PROT 5.9*  ALBUMIN 3.4*   No results for input(s): LIPASE, AMYLASE in the last 168 hours. No results for input(s): AMMONIA in the last 168 hours. Coagulation  Profile: Recent Labs  Lab 06/10/18 0442 06/11/18 0329  INR 1.30 1.20   Cardiac Enzymes: Recent Labs  Lab 06/09/18 1642 06/09/18 2200 06/10/18 0442  TROPONINI 0.05* 0.04* 0.13*   BNP (last 3 results) No results for input(s): PROBNP in the last 8760 hours. HbA1C: No results for input(s): HGBA1C in the last 72 hours. CBG: Recent Labs  Lab 06/15/18 1243 06/15/18 1633 06/15/18 2116 06/16/18 0845 06/16/18 1224  GLUCAP 236* 221* 208* 154* 220*   Lipid Profile: No results for input(s): CHOL, HDL, LDLCALC, TRIG, CHOLHDL, LDLDIRECT in the last 72 hours. Thyroid Function Tests: No results for input(s): TSH, T4TOTAL, FREET4, T3FREE, THYROIDAB in the last 72 hours. Anemia Panel: No results for input(s): VITAMINB12, FOLATE, FERRITIN, TIBC, IRON, RETICCTPCT in the last 72 hours.    Radiology Studies: I have reviewed all of the imaging during  this hospital visit personally     Scheduled Meds: . amLODipine  5 mg Oral Daily  . insulin aspart  0-15 Units Subcutaneous TID WC  . insulin aspart  0-5 Units Subcutaneous QHS  . insulin glargine  20 Units Subcutaneous Daily  . mouth rinse  15 mL Mouth Rinse BID  . metoCLOPramide  10 mg Oral TID AC & HS  . metoprolol succinate  25 mg Oral Daily  . multivitamin with minerals  1 tablet Oral Daily  . nicotine  21 mg Transdermal Daily  . pantoprazole  40 mg Oral BID AC  . PARoxetine  10 mg Oral BID  . pneumococcal 23 valent vaccine  0.5 mL Intramuscular Tomorrow-1000  . polyethylene glycol  17 g Oral BID  . potassium chloride  40 mEq Oral Once  . sucralfate  1 g Oral TID WC & HS   Continuous Infusions:   LOS: 7 days        Mauricio Gerome Apley, MD [facility] Pager [phone]

## 2018-06-16 NOTE — Progress Notes (Addendum)
Daily Rounding Note  06/16/2018, 8:05 AM  LOS: 7 days   SUBJECTIVE:   Chief complaint:     Last Zofran was 1530 yesterday.  Last Morphine was 2323 yesterday.   Felt better yesterday PM, ate ham sandwich and had nausea and vomited over 2 to 3 hours.  Since has been able to tolerate jello, liquids.   Does not feel ready to discharge home until  Consistently tolerating PO.    OBJECTIVE:         Vital signs in last 24 hours:    Temp:  [97.6 F (36.4 C)-98.7 F (37.1 C)] 97.6 F (36.4 C) (07/22 0547) Pulse Rate:  [83-107] 83 (07/22 0547) Resp:  [14-22] 16 (07/22 0100) BP: (119-209)/(79-121) 120/79 (07/22 0547) SpO2:  [97 %-100 %] 98 % (07/22 0547) Last BM Date: 06/14/18 Filed Weights   06/09/18 1700 06/10/18 0428 06/11/18 1537  Weight: 222 lb 14.2 oz (101.1 kg) 223 lb 15.8 oz (101.6 kg) 231 lb 14.8 oz (105.2 kg)   General: looks a bit bloated.    Heart: RRR Chest: clear bil.  No labored breathing Abdomen: soft, NT.  Hypoactive BS.    Extremities: no CCE.   Neuro/Psych:  Oriented x 3.  Alert.    Intake/Output from previous day: 07/21 0701 - 07/22 0700 In: 200 [I.V.:200] Out: -   Intake/Output this shift: No intake/output data recorded.  Lab Results: No results for input(s): WBC, HGB, HCT, PLT in the last 72 hours. BMET Recent Labs    06/14/18 0552 06/15/18 0655 06/16/18 0603  NA 138 138 142  K 3.2* 5.1 2.8*  CL 104 102 104  CO2 22 25 28   GLUCOSE 146* 130* 144*  BUN 6 7 <5*  CREATININE 0.61 0.64 0.62  CALCIUM 8.4* 8.3* 8.6*    Scheduled Meds: . amLODipine  5 mg Oral Daily  . insulin aspart  0-15 Units Subcutaneous TID WC  . insulin aspart  0-5 Units Subcutaneous QHS  . insulin glargine  20 Units Subcutaneous Daily  . mouth rinse  15 mL Mouth Rinse BID  . metoCLOPramide  10 mg Oral TID AC & HS  . metoprolol succinate  25 mg Oral Daily  . multivitamin with minerals  1 tablet Oral Daily  . nicotine   21 mg Transdermal Daily  . pantoprazole  40 mg Oral BID AC  . PARoxetine  10 mg Oral BID  . pneumococcal 23 valent vaccine  0.5 mL Intramuscular Tomorrow-1000  . polyethylene glycol  17 g Oral BID  . sucralfate  1 g Oral TID WC & HS   Continuous Infusions: PRN Meds:.acetaminophen, hydrALAZINE, morphine injection, ondansetron (ZOFRAN) IV, promethazine, traZODone  ASSESMENT:   *   Epigastric pain, N/V/CGE acutely for 2 d PTA.  Anorexia.  Hx GERD.  ASA, no PPI/H2B prior to admission.   06/15/18 EGD: Grade D esophagitis, clean-based ulceration at GEJx, biopsied.  Suspect underlying gastroparesis, though no retained gastric contents per EGD.   BID Po Protonix, Reglan, Carafate now in place.     *   AKI, mild.   Resolved.    *   Hypokalemia.   *   IDDM.  DKA at arrival. Had stopped insulin in setting of acute GI illness.  Hgb A1c 9.4 (mean glucose 223).    *   Inactive Hep B.     *   ETOH abuse.  Last ETOH 05/07/18.  Fatty liver on ultrsound 08/2007.  t  bili 1.3, albumin 3.4, o/w normal LFTs.  Normal Coags.       PLAN   *  PPI BID for 3 months, than drop to q day.  Carafate AC/HS until sxs improved/resolved.  Reglan AC/HS for 2 weeks.  *  Avoid narcotics (Morphine in his case) if possible as they delay gastric emptying  *  Await biopsies, treat any findings (infectious) as necessary.   *  Will sign off.  Made appt for GI follow up.    *  Needs to quit tobacco (cigs and chew)   Azucena Freed  06/16/2018, 8:05 AM Phone [phone]      Attending physician's note   I have taken an interval history, reviewed the chart and examined the patient. I agree with the Advanced Practitioner's note, impression and recommendations. Ulcerative esophagitis and suspected gastroparesis. Discontinue all alcohol and tobacco products. Advance diet slowly to a low residue, fat modified, ADA diet. Closely follow antireflux measures long term. From GI standpoint OK to go home tomorrow on full liquids  if he cannot tolerate solids. PPI bid for 3 months. Carafate ac, hs until symptoms improve. Reglan ac, hs for 2 weeks. GI follow up with Dr. [name]. GI signing off.   Lucio Edward, MD FACG [phone] office

## 2018-06-17 DIAGNOSIS — I10 Essential (primary) hypertension: Secondary | ICD-10-CM

## 2018-06-17 DIAGNOSIS — F1021 Alcohol dependence, in remission: Secondary | ICD-10-CM

## 2018-06-17 DIAGNOSIS — R6881 Early satiety: Secondary | ICD-10-CM

## 2018-06-17 DIAGNOSIS — E1142 Type 2 diabetes mellitus with diabetic polyneuropathy: Secondary | ICD-10-CM

## 2018-06-17 LAB — BASIC METABOLIC PANEL
Anion gap: 7 (ref 5–15)
BUN: 5 mg/dL — AB (ref 6–20)
CALCIUM: 8.6 mg/dL — AB (ref 8.9–10.3)
CO2: 28 mmol/L (ref 22–32)
CREATININE: 0.61 mg/dL (ref 0.61–1.24)
Chloride: 104 mmol/L (ref 98–111)
GFR calc Af Amer: >60 mL/min (ref >60)
GFR calc non Af Amer: >60 mL/min (ref >60)
GLUCOSE: 184 mg/dL — AB (ref 70–99)
Potassium: 3.1 mmol/L — ABNORMAL LOW (ref 3.5–5.1)
Sodium: 139 mmol/L (ref 135–145)

## 2018-06-17 LAB — GLUCOSE, CAPILLARY: Glucose-Capillary: 174 mg/dL — ABNORMAL HIGH (ref 70–99)

## 2018-06-17 MED ORDER — SUCRALFATE 1 GM/10ML PO SUSP: 1.0000 g | Freq: Three times a day (TID) | ORAL | 0 refills | Status: DC

## 2018-06-17 MED ORDER — POTASSIUM CHLORIDE CRYS ER 20 MEQ PO TBCR
40.0000 meq | EXTENDED_RELEASE_TABLET | ORAL | Status: DC
  Administered 2018-06-17: 40 meq via ORAL
  Filled 2018-06-17: qty 2

## 2018-06-17 MED ORDER — PANTOPRAZOLE SODIUM 40 MG PO TBEC: 40.0000 mg | DELAYED_RELEASE_TABLET | Freq: Two times a day (BID) | ORAL | 0 refills | Status: DC

## 2018-06-17 MED ORDER — METOCLOPRAMIDE HCL 10 MG PO TABS: 10.0000 mg | ORAL_TABLET | Freq: Three times a day (TID) | ORAL | 0 refills | Status: DC

## 2018-06-17 NOTE — Progress Notes (Signed)
Discharged home today accompanied by his wife. Personal belongings,discharged instructions given to patient. Advised to pick up medications called in to his pharmacy. Verbalized understanding of the instructions

## 2018-06-17 NOTE — Discharge Summary (Addendum)
Physician Discharge Summary  Grant [name] FBP:[id number] DOB: [birth year] DOA: 06/09/2018  PCP: [name], MD  Admit date: 06/09/2018 Discharge date: 06/17/2018  Admitted From: Home Disposition:  Home   Recommendations for Outpatient Follow-up and new medication changes:  1. Follow up with Dr. [name] in 7 days.  2. Patient has been placed on antiacid therapy with pantoprazole 40 mg po bid and sucralfate qid. 3. Advised to keep a bariatric diet with small portions and frequent meals. 4. Use metoclopramide before meals to prevent nausea and vomiting.  5. Continue insulin regimen.  6. Follow up with the GI office Dr. [name].   Home Health: no   Equipment/Devices: no    Discharge Condition: stable  CODE STATUS: full  Diet recommendation: Heart healthy and diabetic prudent (bariatric with small portions and frequent meals)  Brief/Interim Summary: 47 year old male who presented with nausea, vomitingandhematemesis. He does have thesignificant past medical history for hypertension, dyslipidemia, type 2 diabetes mellitus, depression, peripheral neuropathy, chronic back pain, alcohol and tobacco abuse. Patient reported 3-day history of nausea, and vomiting, complicated by coffee-ground emesis, abdominal distention and poor oral intake. On his initial physical examination blood pressure 161/117, heart rate 145, respiratory rate 25, oxygen saturation 100%, dry mucous membranes, lungs clear to auscultation bilaterally, heart S1-S2 present, tachycardic, abdomen was distended, nontender, no lower extremity edema. Sodium134, potassium 3.9, chloride 98, bicarb 10, glucose 483, creatinine 1.52, anion gap 26,white cell count 24, hemoglobin 19.1, hematocrit 56.7, platelets 324,urinalysis 30 protein, specific gravity 1.023, greater than 500 glucose, 0-5redcells, 0-5 white cells. Urine drug testing positive for benzodiazepines and tetrahydrocannabinol. Chest x-ray negative for  infiltrates.  Patientwas admitted to the stepdown unit with a working diagnosis of diabetes ketoacidosis, complicated by acute kidney injury and hematemesis.   1.  Type 2 diabetes mellitus with diabetes ketoacidosis.  Patient was admitted to the hospital, he received IV fluids and IV insulin, his anion gap closed and he was successfully transitioned to subcutaneous insulin.  His p.o. intake was limited due to his ulcerative and erosive esophagitis.  His insulin regimen was adjusted, in the hospital he used sliding scale short acting insulin and basal insulin with 25 units of Levemir.  Fasting glucose at discharge 184.  At discharge patient will resume NPH insulin 30 units in the morning and 40 units in the evening, continue insulin sliding scale with regular insulin, continue capillary glucose monitoring.  2.  Metabolic encephalopathy with delirium. 7/15.Patient developed hallucinations, altered mentation, required transfer to intensive care unit, he received precedex infusion and as needed benzodiazepineper CIWAprotocol. Eventually his symptoms improved and he was transfer to the medical unit 7/18.   3.  Acute ulcerative and erosive esophagitis.  Had persistent abdominal pain, reflux, nausea and vomiting.  Initially refractive to antiacid therapy with proton pump inhibitors and sucralfate, patient received IV fluids and as needed IV antiemetics.  He underwent upper endoscopy which showed LA grade D reflux, acute ulceration/erosive esophagitis (biopsied).  He was advised to eat small portions with frequent meals, use metoclopramide before eating.  His symptoms slowly improved, to the point where he has been eating with no major complications.  He will continue pantoprazole 40 g twice daily for at least 3 months, sucralfate 1 g 3 times daily with meals and at bedtime until symptoms resolve, metoclopramide 10 mg 3 times daily before meals and at bedtime for about 2 weeks.  Follow-up with the outpatient  GI clinic.  4.  Prerenal acute kidney injury with hypokalemia/ hypomagnesemia.  Patient  received IV fluids with improvement of kidney function, his potassium and magnesium have been corrected.  Discharge creatinine 0.61.  Patient now tolerating p.o. diet adequately.  5.  Hypertension.  Continue blood pressure control with metoprolol and amlodipine.  6.  Depression, history of tobacco and alcohol abuse.  He was advised about smoking and alcohol consumption cessation, continue paroxetine.    Discharge Diagnoses:  Principal Problem:   DKA (diabetic ketoacidoses) ([place]) Active Problems:   Essential hypertension   ALCOHOL ABUSE, HX OF   Dehydration   Nausea and vomiting   Hypercalcemia   SIRS (systemic inflammatory response syndrome) ([facility])   Hematemesis   Type 2 diabetes mellitus with peripheral neuropathy ([facility])   Early satiety   Acute esophagitis    Discharge Instructions   Allergies as of 06/17/2018      Reactions   Tylox [oxycodone-acetaminophen] Nausea And Vomiting      Medication List    STOP taking these medications   aspirin EC 81 MG tablet     TAKE these medications   amLODipine 5 MG tablet Commonly known as:  NORVASC Take 5 mg by mouth daily.   insulin NPH Human 100 UNIT/ML injection Commonly known as:  HUMULIN N,NOVOLIN N Inject 30-40 Units into the skin See admin instructions. Use 30 units every morning then use 40 units every evening   insulin regular 100 units/mL injection Commonly known as:  NOVOLIN R,HUMULIN R Inject 4-8 Units into the skin 3 (three) times daily before meals. Sliding scale   metoCLOPramide 10 MG tablet Commonly known as:  REGLAN Take 1 tablet (10 mg total) by mouth 4 (four) times daily -  before meals and at bedtime for 15 days.   metoprolol succinate 25 MG 24 hr tablet Commonly known as:  TOPROL-XL Take 25 mg by mouth daily.   pantoprazole 40 MG tablet Commonly known as:  PROTONIX Take 1 tablet (40 mg total) by mouth 2 (two)  times daily before a meal.   PARoxetine 10 MG tablet Commonly known as:  PAXIL Take 10 mg by mouth 2 (two) times daily.   sucralfate 1 GM/10ML suspension Commonly known as:  CARAFATE Take 10 mLs (1 g total) by mouth 4 (four) times daily -  with meals and at bedtime.      Follow-up Information    Zehr, Laban Emperor, PA-C Follow up on 07/17/2018.   Specialty:  Gastroenterology Why:  8:30 AM with PA for GI, Dr [name].   Contact information: [place] [address] [phone]          Allergies  Allergen Reactions  . Tylox [Oxycodone-Acetaminophen] Nausea And Vomiting    Consultations:  GI   Procedures/Studies: Dg Chest 1 View  Result Date: 06/09/2018 CLINICAL DATA:  Hypoxia EXAM: CHEST  1 VIEW COMPARISON:  08/06/2013 FINDINGS: Cardiac silhouette is normal in size and configuration. No mediastinal or hilar masses. No evidence of adenopathy. Prominent bronchovascular markings noted in the lung bases. No evidence of pneumonia or pulmonary edema. No pleural effusion or pneumothorax. Skeletal structures are unremarkable. IMPRESSION: No active disease. Electronically Signed   By: Lajean Manes M.D.   On: 06/09/2018 16:32   Ct Head Wo Contrast  Result Date: 06/10/2018 CLINICAL DATA:  47 year old male with altered mental status. EXAM: CT HEAD WITHOUT CONTRAST TECHNIQUE: Contiguous axial images were obtained from the base of the skull through the vertex without intravenous contrast. COMPARISON:  None. FINDINGS: Brain: There is close and sulci appropriate size for patient's age.  The gray-white matter discrimination is preserved. There is no acute intracranial hemorrhage. No mass effect or midline shift. No extra-axial fluid collection. Vascular: No hyperdense vessel or unexpected calcification. Skull: Normal. Negative for fracture or focal lesion. Sinuses/Orbits: The visualized paranasal sinuses and the right mastoid air cells are clear. Left mastoid effusion. Other: None  IMPRESSION: 1. Unremarkable noncontrast CT of the brain. 2. Left mastoid effusions. Electronically Signed   By: Anner Crete M.D.   On: 06/10/2018 06:02       Subjective: Patient is feeling better, no further nausea or vomiting, has been ambulating.   Discharge Exam: Vitals:   06/16/18 2121 06/17/18 0502  BP: (!) 122/96 122/83  Pulse: 93 80  Resp:  18  Temp: 98.2 F (36.8 C) 98.2 F (36.8 C)  SpO2: 99% 97%   Vitals:   06/16/18 1219 06/16/18 1421 06/16/18 2121 06/17/18 0502  BP: (!) 154/101 118/85 (!) 122/96 122/83  Pulse: (!) 102 96 93 80  Resp: 18 18  18   Temp: 98.4 F (36.9 C) 98.2 F (36.8 C) 98.2 F (36.8 C) 98.2 F (36.8 C)  TempSrc: Oral Oral Oral Oral  SpO2: 98% 99% 99% 97%  Weight:      Height:        General: Not in pain or dyspnea Neurology: Awake and alert, non focal  E ENT: no pallor, no icterus, oral mucosa moist Cardiovascular: No JVD. S1-S2 present, rhythmic, no gallops, rubs, or murmurs. No lower extremity edema. Pulmonary: vesicular breath sounds bilaterally, adequate air movement, no wheezing, rhonchi or rales. Gastrointestinal. Abdomen with no organomegaly, non tender, no rebound or guarding Skin. No rashes Musculoskeletal: no joint deformities   The results of significant diagnostics from this hospitalization (including imaging, microbiology, ancillary and laboratory) are listed below for reference.     Microbiology: Recent Results (from the past 240 hour(s))  Culture, blood (x 2)     Status: None   Collection Time: 06/09/18  7:49 AM  Result Value Ref Range Status   Specimen Description BLOOD BLOOD LEFT HAND  Final   Special Requests   Final    BOTTLES DRAWN AEROBIC AND ANAEROBIC Blood Culture adequate volume   Culture   Final    NO GROWTH 5 DAYS Performed at [facility], [address], [place], [place] [address]    Report Status 06/14/2018 FINAL  Final  Culture, blood (x 2)     Status: None   Collection Time: 06/09/18   7:49 AM  Result Value Ref Range Status   Specimen Description BLOOD BLOOD RIGHT HAND  Final   Special Requests   Final    BOTTLES DRAWN AEROBIC AND ANAEROBIC Blood Culture adequate volume   Culture   Final    NO GROWTH 5 DAYS Performed at [facility], [place] [address], [place], [place] [address]    Report Status 06/14/2018 FINAL  Final  MRSA PCR Screening     Status: None   Collection Time: 06/09/18  9:04 AM  Result Value Ref Range Status   MRSA by PCR NEGATIVE NEGATIVE Final    Comment:        The GeneXpert MRSA Assay (FDA approved for NASAL specimens only), is one component of a comprehensive MRSA colonization surveillance program. It is not intended to diagnose MRSA infection nor to guide or monitor treatment for MRSA infections. Performed at [facility], [place] [address] [place], [place] [address]      Labs: BNP (last 3  results) No results for input(s): BNP in the last 8760 hours. Basic Metabolic Panel: Recent Labs  Lab 06/10/18 1409 06/11/18 0329 06/12/18 0610 06/13/18 0354 06/14/18 0552 06/15/18 0655 06/16/18 0603 06/16/18 1324 06/17/18 0705  NA 139 139 140 138 138 138 142  --  139  K 2.9* 3.2* 3.0* 3.0* 3.2* 5.1 2.8* 3.1* 3.1*  CL 114* 109 103 103 104 102 104  --  104  CO2 18* 19* 24 24 22 25 28   --  28  GLUCOSE 108* 219* 195* 167* 146* 130* 144*  --  184*  BUN 10 6 5* 6 6 7  <5*  --  5*  CREATININE 0.67 0.70 0.69 0.58* 0.61 0.64 0.62  --  0.61  CALCIUM 8.2* 8.5* 8.4* 8.3* 8.4* 8.3* 8.6*  --  8.6*  MG  --  1.8 2.1  --  1.8 2.3  --   --   --   PHOS 1.9* 1.2*  --   --   --   --   --   --   --    Liver Function Tests: No results for input(s): AST, ALT, ALKPHOS, BILITOT, PROT, ALBUMIN in the last 168 hours. No results for input(s): LIPASE, AMYLASE in the last 168 hours. No results for input(s): AMMONIA in the last 168 hours. CBC: Recent Labs  Lab 06/11/18 0329 06/12/18 0610  WBC 12.8* 10.6*  HGB 15.1 15.0  HCT 42.7 43.7  MCV 87.3 88.5   PLT 163 181   Cardiac Enzymes: No results for input(s): CKTOTAL, CKMB, CKMBINDEX, TROPONINI in the last 168 hours. BNP: Invalid input(s): POCBNP CBG: Recent Labs  Lab 06/16/18 0845 06/16/18 1224 06/16/18 1743 06/16/18 2127 06/17/18 0813  GLUCAP 154* 220* 174* 185* 174*   D-Dimer No results for input(s): DDIMER in the last 72 hours. Hgb A1c No results for input(s): HGBA1C in the last 72 hours. Lipid Profile No results for input(s): CHOL, HDL, LDLCALC, TRIG, CHOLHDL, LDLDIRECT in the last 72 hours. Thyroid function studies No results for input(s): TSH, T4TOTAL, T3FREE, THYROIDAB in the last 72 hours.  Invalid input(s): FREET3 Anemia work up No results for input(s): VITAMINB12, FOLATE, FERRITIN, TIBC, IRON, RETICCTPCT in the last 72 hours. Urinalysis    Component Value Date/Time   COLORURINE STRAW (A) 06/09/2018 0523   APPEARANCEUR CLEAR 06/09/2018 0523   LABSPEC 1.023 06/09/2018 0523   PHURINE 5.0 06/09/2018 0523   GLUCOSEU >=500 (A) 06/09/2018 0523   HGBUR MODERATE (A) 06/09/2018 0523   BILIRUBINUR NEGATIVE 06/09/2018 0523   KETONESUR 80 (A) 06/09/2018 0523   PROTEINUR 30 (A) 06/09/2018 0523   UROBILINOGEN 1.0 09/06/2008 0900   NITRITE NEGATIVE 06/09/2018 0523   LEUKOCYTESUR NEGATIVE 06/09/2018 0523   Sepsis Labs Invalid input(s): PROCALCITONIN,  WBC,  LACTICIDVEN Microbiology Recent Results (from the past 240 hour(s))  Culture, blood (x 2)     Status: None   Collection Time: 06/09/18  7:49 AM  Result Value Ref Range Status   Specimen Description BLOOD BLOOD LEFT HAND  Final   Special Requests   Final    BOTTLES DRAWN AEROBIC AND ANAEROBIC Blood Culture adequate volume   Culture   Final    NO GROWTH 5 DAYS Performed at [facility], [place] [address], [place], [place] [address]    Report Status 06/14/2018 FINAL  Final  Culture, blood (x 2)     Status: None   Collection Time: 06/09/18  7:49 AM  Result Value Ref Range Status   Specimen Description BLOOD  BLOOD RIGHT HAND  Final   Special Requests   Final    BOTTLES DRAWN AEROBIC AND ANAEROBIC Blood Culture adequate volume   Culture   Final    NO GROWTH 5 DAYS Performed at [facility], [address], [place], [place] [address]    Report Status 06/14/2018 FINAL  Final  MRSA PCR Screening     Status: None   Collection Time: 06/09/18  9:04 AM  Result Value Ref Range Status   MRSA by PCR NEGATIVE NEGATIVE Final    Comment:        The GeneXpert MRSA Assay (FDA approved for NASAL specimens only), is one component of a comprehensive MRSA colonization surveillance program. It is not intended to diagnose MRSA infection nor to guide or monitor treatment for MRSA infections. Performed at [facility], [place] [address] [place], [place] [address]      Time coordinating discharge: 45 minutes  SIGNED:   Tawni Millers, MD  [facility] 06/17/2018, 10:54 AM Pager [phone]  If 7PM-7AM, please contact night-coverage [email or web address] Password [email or web address]

## 2018-06-17 NOTE — Progress Notes (Signed)
Patient provided with [facility] letter. Spoke w him about preferred pharmacy. He uses [facility], it accepts [place]. Spoke w Dr Cathlean Sauer and he will send Rxs there. Patient updated. No further CM needs.

## 2018-06-23 ENCOUNTER — Encounter: Payer: Self-pay | Admitting: Internal Medicine

## 2018-07-09 ENCOUNTER — Ambulatory Visit (INDEPENDENT_AMBULATORY_CARE_PROVIDER_SITE_OTHER): Payer: Self-pay | Admitting: Physician Assistant

## 2018-07-16 ENCOUNTER — Emergency Department (HOSPITAL_COMMUNITY): Payer: Self-pay

## 2018-07-16 ENCOUNTER — Encounter (HOSPITAL_COMMUNITY): Payer: Self-pay | Admitting: Emergency Medicine

## 2018-07-16 ENCOUNTER — Emergency Department (HOSPITAL_COMMUNITY)
Admission: EM | Admit: 2018-07-16 | Discharge: 2018-07-16 | Disposition: A | Discharge status: Home / Self Care | Payer: Self-pay | Attending: Emergency Medicine | Admitting: Emergency Medicine

## 2018-07-16 DIAGNOSIS — R9431 Abnormal electrocardiogram [ECG] [EKG]: Secondary | ICD-10-CM | POA: Diagnosis present

## 2018-07-16 DIAGNOSIS — F17210 Nicotine dependence, cigarettes, uncomplicated: Secondary | ICD-10-CM | POA: Insufficient documentation

## 2018-07-16 DIAGNOSIS — E119 Type 2 diabetes mellitus without complications: Secondary | ICD-10-CM | POA: Insufficient documentation

## 2018-07-16 DIAGNOSIS — Z79899 Other long term (current) drug therapy: Secondary | ICD-10-CM | POA: Insufficient documentation

## 2018-07-16 DIAGNOSIS — Z794 Long term (current) use of insulin: Secondary | ICD-10-CM | POA: Insufficient documentation

## 2018-07-16 DIAGNOSIS — K3184 Gastroparesis: Secondary | ICD-10-CM | POA: Insufficient documentation

## 2018-07-16 DIAGNOSIS — R739 Hyperglycemia, unspecified: Secondary | ICD-10-CM

## 2018-07-16 DIAGNOSIS — R112 Nausea with vomiting, unspecified: Secondary | ICD-10-CM | POA: Insufficient documentation

## 2018-07-16 DIAGNOSIS — I10 Essential (primary) hypertension: Secondary | ICD-10-CM | POA: Insufficient documentation

## 2018-07-16 LAB — CBC
HEMATOCRIT: 46.7 % (ref 39.0–52.0)
Hemoglobin: 16.1 g/dL (ref 13.0–17.0)
MCH: 30 pg (ref 26.0–34.0)
MCHC: 34.5 g/dL (ref 30.0–36.0)
MCV: 87.1 fL (ref 78.0–100.0)
Platelets: 223 10*3/uL (ref 150–400)
RBC: 5.36 MIL/uL (ref 4.22–5.81)
RDW: 11.7 % (ref 11.5–15.5)
WBC: 14.5 10*3/uL — ABNORMAL HIGH (ref 4.0–10.5)

## 2018-07-16 LAB — URINALYSIS, ROUTINE W REFLEX MICROSCOPIC
BACTERIA UA: NONE SEEN
Bilirubin Urine: NEGATIVE
Hgb urine dipstick: NEGATIVE
Ketones, ur: 20 mg/dL — AB
Leukocytes, UA: NEGATIVE
NITRITE: NEGATIVE
PH: 7 (ref 5.0–8.0)
Protein, ur: NEGATIVE mg/dL
SPECIFIC GRAVITY, URINE: 1.024 (ref 1.005–1.030)

## 2018-07-16 LAB — COMPREHENSIVE METABOLIC PANEL
ALBUMIN: 4.1 g/dL (ref 3.5–5.0)
ALK PHOS: 68 U/L (ref 38–126)
ALT: 19 U/L (ref 0–44)
AST: 26 U/L (ref 15–41)
Anion gap: 12 (ref 5–15)
BILIRUBIN TOTAL: 1.1 mg/dL (ref 0.3–1.2)
BUN: 6 mg/dL (ref 6–20)
CO2: 22 mmol/L (ref 22–32)
Calcium: 8.8 mg/dL — ABNORMAL LOW (ref 8.9–10.3)
Chloride: 104 mmol/L (ref 98–111)
Creatinine, Ser: 0.78 mg/dL (ref 0.61–1.24)
GFR calc Af Amer: >60 mL/min (ref >60)
GFR calc non Af Amer: >60 mL/min (ref >60)
GLUCOSE: 287 mg/dL — AB (ref 70–99)
POTASSIUM: 3.8 mmol/L (ref 3.5–5.1)
Sodium: 138 mmol/L (ref 135–145)
TOTAL PROTEIN: 7 g/dL (ref 6.5–8.1)

## 2018-07-16 LAB — CBG MONITORING, ED: Glucose-Capillary: 273 mg/dL — ABNORMAL HIGH (ref 70–99)

## 2018-07-16 LAB — I-STAT TROPONIN, ED
TROPONIN I, POC: 0 ng/mL (ref 0.00–0.08)
TROPONIN I, POC: 0.02 ng/mL (ref 0.00–0.08)

## 2018-07-16 LAB — LIPASE, BLOOD: Lipase: 34 U/L (ref 11–51)

## 2018-07-16 MED ORDER — GI COCKTAIL ~~LOC~~
30.0000 mL | Freq: Once | ORAL | Status: AC
  Administered 2018-07-16: 30 mL via ORAL
  Filled 2018-07-16: qty 30

## 2018-07-16 MED ORDER — METOCLOPRAMIDE HCL 5 MG/ML IJ SOLN
10.0000 mg | Freq: Once | INTRAMUSCULAR | Status: AC
  Administered 2018-07-16: 10 mg via INTRAMUSCULAR

## 2018-07-16 MED ORDER — SODIUM CHLORIDE 0.9 % IV BOLUS
2000.0000 mL | Freq: Once | INTRAVENOUS | Status: AC
  Administered 2018-07-16: 2000 mL via INTRAVENOUS

## 2018-07-16 MED ORDER — FENTANYL CITRATE (PF) 100 MCG/2ML IJ SOLN
50.0000 ug | Freq: Once | INTRAMUSCULAR | Status: AC
  Administered 2018-07-16: 50 ug via INTRAVENOUS
  Filled 2018-07-16: qty 2

## 2018-07-16 MED ORDER — SODIUM CHLORIDE 0.9 % IV BOLUS: 1000.0000 mL | Freq: Once | INTRAVENOUS | Status: DC

## 2018-07-16 MED ORDER — DIPHENHYDRAMINE HCL 50 MG/ML IJ SOLN
25.0000 mg | Freq: Once | INTRAMUSCULAR | Status: AC
  Administered 2018-07-16: 25 mg via INTRAVENOUS
  Filled 2018-07-16: qty 1

## 2018-07-16 MED ORDER — METOCLOPRAMIDE HCL 5 MG/ML IJ SOLN
10.0000 mg | Freq: Once | INTRAMUSCULAR | Status: DC
  Filled 2018-07-16: qty 2

## 2018-07-16 NOTE — ED Notes (Signed)
Ice and gingerale given

## 2018-07-16 NOTE — ED Provider Notes (Addendum)
Medical screening examination/treatment/procedure(s) were conducted as a shared visit with non-physician practitioner(s) and myself.  I personally evaluated the patient during the encounter.  Past medical history significant for diabetes and gastroparesis with history of alcohol abuse.  Patient has had upper abdominal pain with multiple episodes of nausea and vomiting.  This started fairly acutely this morning.  Physical Exam  BP 113/82   Pulse 76   Resp 16   Ht 5' 11.5" (1.816 m)   Wt 99.8 kg   SpO2 98%   BMI 30.26 kg/m   Physical Exam Patient is alert and appropriate.  No respiratory distress.  Appears moderately discomfort.  Skin warm and dry.  All movements coordinated purposeful symmetric. ED Course/Procedures   Clinical Course as of Jul 19 1527  Wed Jul 16, 2018  0849 Patient does not have working Iv.  Reglan orders are changed to IM.   [EH]  0277 Informed that patient is now restless, most likely secondary to Reglan.  Orders given for Benadryl.   [EH]  1300 Reevaluated, discussed plan with him.  At this point he does not wish to have CT scan of his abdomen pelvis.  Will give GI cocktail, pain medicine, continue fluids and reassess.   [EH]  1451 Patient reevaluated, he states that his pain is down to a 1 out of 10 and his nausea is controlled.  He has passed p.o. challenge.  He has an appointment with his gastroenterologist tomorrow morning.   [EH]    Clinical Course User Index [EH] Lorin Glass, PA-C    Procedures Angiocath insertion Performed by: Charlesetta Shanks  Consent: Verbal consent obtained. Risks and benefits: risks, benefits and alternatives were discussed Time out: Immediately prior to procedure a "time out" was called to verify the correct patient, procedure, equipment, support staff and site/side marked as required.  Preparation: Patient was prepped and draped in the usual sterile fashion.  Vein Location: Right basilic  Ultrasound Guided  Gauge:  20  Normal blood return and flush without difficulty Patient tolerance: Patient tolerated the procedure well with no immediate complications.   MDM  Plan for rehydration and further diagnostic studies.  Patient is alert with clear mental status and no respiratory distress.  I agree with plan of management.      Charlesetta Shanks, MD 07/16/18 4128    Charlesetta Shanks, MD 07/18/18 [phone]

## 2018-07-16 NOTE — ED Notes (Signed)
Grant [name], [place] aware pt requesting pain meds 8/10 decreased to 4/10 and pt reports he is still nauseous and trying to stay completely still.

## 2018-07-16 NOTE — Discharge Instructions (Addendum)
Please keep your appointment with your gastroenterologist for tomorrow.  If your symptoms worsen or you have additional concerns please seek additional medical care and evaluation.

## 2018-07-16 NOTE — ED Triage Notes (Signed)
Per EMS pt from home woke up around 4am w/ nausea.  "feels like the time I was in DKA"  No insulin this morning, denies chest pain but verbalized SOB.  Pt has bowel movements X2 upon arrival at ED X2.  EMS gave 500NS and 8mg  zofran

## 2018-07-16 NOTE — ED Notes (Signed)
ED Provider at bedside. 

## 2018-07-16 NOTE — ED Notes (Signed)
Pt stable, ambulatory, states understanding of discharge instructions 

## 2018-07-16 NOTE — ED Notes (Signed)
Patient requesting to ambulate to restroom to have a BM. Pt ambulated with steady gait.

## 2018-07-16 NOTE — ED Notes (Signed)
Iv access lost prior to meds being given, this RN attempted to site iv x2, [place] Rn also attempted to site iv on patient unsuccessfully. This Rn requested that meds be given IM until IV could be established, PA verbalized IM route for meds is okay at this time. Dr. Johnney Killian made aware and will attempt to do ultrasound IV

## 2018-07-16 NOTE — ED Notes (Signed)
Attempted to triage pt however he demanded to run to the bathroom for bowel movements.

## 2018-07-16 NOTE — ED Provider Notes (Signed)
[facility] EMERGENCY DEPARTMENT Provider Note   CSN: [id number] Arrival date & time: 07/16/18  7106     History   Chief Complaint Chief Complaint  Patient presents with  . Nausea    HPI Grant [name] is a 47 y.o. male with a past medical history of DM 2, gastroparesis, history of alcohol abuse, who presents today for evaluation of upper abdominal pain, nausea, and vomiting.  History is obtained from patient, wife, and chart review.  He woke up at around 4 AM with nausea, states he feels like the time he was in DKA.  No fevers or chills.  He denies chest pain, does report breathing heavy.  He denies any trauma recently.  HPI  Past Medical History:  Diagnosis Date  . Chronic low back pain 11/30/2015  . Diabetic peripheral neuropathy ([place]) 11/30/2015  . Hypertension   . Neuropathy   . Type 2 diabetes mellitus [facility])     Patient Active Problem List   Diagnosis Date Noted  . Long QT interval 07/16/2018  . Early satiety   . Acute esophagitis   . Hypercalcemia 06/09/2018  . SIRS (systemic inflammatory response syndrome) ([place]) 06/09/2018  . Hematemesis 06/09/2018  . Type 2 diabetes mellitus with peripheral neuropathy ([place]) 06/09/2018  . Acidosis 09/17/2016  . Dehydration 09/17/2016  . Abdominal pain 09/17/2016  . DKA (diabetic ketoacidoses) ([place]) 09/17/2016  . Nausea and vomiting 09/17/2016  . Diabetes mellitus with complication ([place])   . Gastroparesis   . Diabetic peripheral neuropathy ([place]) 11/30/2015  . Chronic low back pain 11/30/2015  . Coronary artery spasm ([place]) 08/17/2013  . Hyperlipidemia 08/17/2013  . Peripheral neuropathy 08/05/2013  . Chest pain 08/05/2013  . Fatigue 08/05/2013  . Tachycardia 08/05/2013  . DOE (dyspnea on exertion) 08/05/2013  . Diabetes mellitus, insulin dependent (IDDM), uncontrolled ([place]) 02/09/2008  . Anxiety state 02/09/2008  . Essential hypertension 02/09/2008  . ALCOHOL ABUSE, HX OF 02/09/2008  . LIVER FUNCTION  TESTS, ABNORMAL, HX OF 02/09/2008  . NEOPLASM, BENIGN, ESOPHAGUS 09/16/2007  . Gastritis 09/16/2007    Past Surgical History:  Procedure Laterality Date  . back lipoma resection  03/2006  . BIOPSY  06/15/2018   Procedure: BIOPSY;  Surgeon: Jerene Bears, MD;  Location: [facility] ENDOSCOPY;  Service: Gastroenterology;;  . ESOPHAGOGASTRODUODENOSCOPY (EGD) WITH PROPOFOL N/A 06/15/2018   Procedure: ESOPHAGOGASTRODUODENOSCOPY (EGD) WITH PROPOFOL;  Surgeon: Jerene Bears, MD;  Location: [facility];  Service: Gastroenterology;  Laterality: N/A;  . KNEE ARTHROSCOPY  10/2008   left  . LEFT HEART CATHETERIZATION WITH CORONARY ANGIOGRAM N/A 08/11/2013   Procedure: LEFT HEART CATHETERIZATION WITH CORONARY ANGIOGRAM;  Surgeon: Pixie Casino, MD;  Location: [facility] CATH LAB;  Service: Cardiovascular;  Laterality: N/A;  . UPPER GI ENDOSCOPY  10/2007   showed gastritis        Home Medications    Prior to Admission medications   Medication Sig Start Date End Date Taking? Authorizing Provider  acetaminophen (TYLENOL) 500 MG tablet Take 1,000 mg by mouth every 8 (eight) hours as needed for mild pain or headache.   Yes [provider]  amLODipine (NORVASC) 5 MG tablet Take 5 mg by mouth daily. 06/07/18  Yes [provider]  calcium carbonate (TUMS EX) 750 MG chewable tablet Chew 2 tablets by mouth as needed for heartburn.   Yes [provider]  hydrOXYzine (ATARAX/VISTARIL) 25 MG tablet Take 25 mg by mouth at bedtime as needed for anxiety (sleep).  07/11/18  Yes [provider]  insulin NPH Human (HUMULIN N,NOVOLIN N) 100 UNIT/ML injection Inject 30-40 Units into the skin See admin instructions. Use 30 units every morning then use 40 units every evening   Yes [provider]  insulin regular (NOVOLIN R,HUMULIN R) 100 units/mL injection Inject 4-8 Units into the skin 3 (three) times daily before meals. Sliding scale   Yes [provider]  metoprolol succinate  (TOPROL-XL) 25 MG 24 hr tablet Take 25 mg by mouth daily. 06/07/18  Yes [provider]  PARoxetine (PAXIL) 10 MG tablet Take 10 mg by mouth 2 (two) times daily. 05/08/18  Yes [provider]  sucralfate (CARAFATE) 1 GM/10ML suspension Take 10 mLs (1 g total) by mouth 4 (four) times daily -  with meals and at bedtime. 06/17/18 07/17/18 Yes Arrien, Jimmy Picket, MD  metoCLOPramide (REGLAN) 10 MG tablet Take 1 tablet (10 mg total) by mouth 4 (four) times daily -  before meals and at bedtime for 15 days. 06/17/18 07/02/18  Arrien, Jimmy Picket, MD  pantoprazole (PROTONIX) 40 MG tablet Take 1 tablet (40 mg total) by mouth 2 (two) times daily before a meal. Patient not taking: Reported on 07/16/2018 06/17/18 07/17/18  Arrien, Jimmy Picket, MD    Family History Family History  Problem Relation Age of Onset  . Diabetes Mother   . Hypertension Mother   . Hypertension Father   . Hyperlipidemia Father   . Alcohol abuse Brother   . Brain cancer Unknown        grandparent  . Stomach cancer Unknown        grandparent    Social History Social History   Tobacco Use  . Smoking status: Current Every Day Smoker    Packs/day: 0.50    Years: 20.00    Pack years: 10.00  . Smokeless tobacco: Current User    Types: Chew  . Tobacco comment: 5-7 cigarettes/day; chews occasionally  Substance Use Topics  . Alcohol use: No  . Drug use: No     Allergies   Tylox [oxycodone-acetaminophen]   Review of Systems Review of Systems  Constitutional: Negative for chills and fever.  HENT: Negative for ear pain and sore throat.   Eyes: Negative for pain and visual disturbance.  Respiratory: Positive for shortness of breath. Negative for cough.   Cardiovascular: Negative for chest pain and palpitations.  Gastrointestinal: Positive for abdominal pain, nausea and vomiting. Negative for constipation and diarrhea.  Genitourinary: Negative for dysuria and hematuria.  Musculoskeletal: Negative  for arthralgias and back pain.  Skin: Negative for color change and rash.  Neurological: Negative for seizures and syncope.  All other systems reviewed and are negative.    Physical Exam Updated Vital Signs BP 113/82   Pulse 76   Resp 16   Ht 5' 11.5" (1.816 m)   Wt 99.8 kg   SpO2 98%   BMI 30.26 kg/m   Physical Exam  Constitutional: He appears well-developed and well-nourished. No distress.  HENT:  Head: Normocephalic and atraumatic.  Mouth/Throat: Oropharynx is clear and moist.  Eyes: Conjunctivae are normal. Right eye exhibits no discharge. Left eye exhibits no discharge. No scleral icterus.  Neck: Normal range of motion. Neck supple.  Cardiovascular: Normal rate, regular rhythm and normal heart sounds. Exam reveals no friction rub.  No murmur heard. Pulmonary/Chest: Effort normal and breath sounds normal. No stridor. No respiratory distress. He has no wheezes.  Abdominal: Soft. Bowel sounds are normal. He exhibits no distension and no mass. There  is generalized tenderness and tenderness in the epigastric area. There is no guarding.  Musculoskeletal: He exhibits no edema or deformity.  Neurological: He is alert. He exhibits normal muscle tone.  Skin: Skin is warm and dry. He is not diaphoretic.  Psychiatric: He has a normal mood and affect. His behavior is normal.  Nursing note and vitals reviewed.    ED Treatments / Results  Labs (all labs ordered are listed, but only abnormal results are displayed) Labs Reviewed  COMPREHENSIVE METABOLIC PANEL - Abnormal; Notable for the following components:      Result Value   Glucose, Bld 287 (*)    Calcium 8.8 (*)    All other components within normal limits  CBC - Abnormal; Notable for the following components:   WBC 14.5 (*)    All other components within normal limits  URINALYSIS, ROUTINE W REFLEX MICROSCOPIC - Abnormal; Notable for the following components:   Glucose, UA >=500 (*)    Ketones, ur 20 (*)    All other  components within normal limits  CBG MONITORING, ED - Abnormal; Notable for the following components:   Glucose-Capillary 273 (*)    All other components within normal limits  LIPASE, BLOOD  I-STAT TROPONIN, ED  I-STAT TROPONIN, ED    EKG None  Radiology Dg Chest Port 1 View  Result Date: 07/16/2018 CLINICAL DATA:  Shortness of breath EXAM: PORTABLE CHEST 1 VIEW COMPARISON:  06/09/2018 FINDINGS: Heart and mediastinal contours are within normal limits. No focal opacities or effusions. No acute bony abnormality. IMPRESSION: No active disease. Electronically Signed   By: Rolm Baptise M.D.   On: 07/16/2018 08:21    Procedures Procedures (including critical care time)  Medications Ordered in ED Medications  sodium chloride 0.9 % bolus 2,000 mL (0 mLs Intravenous Stopped 07/16/18 1343)  fentaNYL (SUBLIMAZE) injection 50 mcg (50 mcg Intravenous Given 07/16/18 0925)  metoCLOPramide (REGLAN) injection 10 mg (10 mg Intramuscular Given 07/16/18 0845)  diphenhydrAMINE (BENADRYL) injection 25-50 mg (25 mg Intravenous Given 07/16/18 1015)  fentaNYL (SUBLIMAZE) injection 50 mcg (50 mcg Intravenous Given 07/16/18 1250)  gi cocktail (Maalox,Lidocaine,Donnatal) (30 mLs Oral Given 07/16/18 1250)     Initial Impression / Assessment and Plan / ED Course  I have reviewed the triage vital signs and the nursing notes.  Pertinent labs & imaging results that were available during my care of the patient were reviewed by me and considered in my medical decision making (see chart for details).  Clinical Course as of Jul 16 1541  Wed Jul 16, 2018  0849 Patient does not have working Iv.  Reglan orders are changed to IM.   [EH]  7341 Informed that patient is now restless, most likely secondary to Reglan.  Orders given for Benadryl.   [EH]  1300 Reevaluated, discussed plan with him.  At this point he does not wish to have CT scan of his abdomen pelvis.  Will give GI cocktail, pain medicine, continue fluids and  reassess.   [EH]  1451 Patient reevaluated, he states that his pain is down to a 1 out of 10 and his nausea is controlled.  He has passed p.o. challenge.  He has an appointment with his gastroenterologist tomorrow morning.   [EH]    Clinical Course User Index [EH] Lorin Glass, PA-C   Patient with history of gastroparesis presents today for evaluation of nausea and upper abdominal pain.  On initial exam he was vomiting, despite being given 8 mg of Zofran by  EMS.  He states that gastroparesis is a new diagnosis, and he does not know how it usually feels.  He did report mild shortness of breath, normal CXR, and shortness of breath improved with pain and symptom control.  Normal troponin.  Labs were obtained and reviewed as patient was concerned he was in DKA.  Patient sugar is mildly elevated at 287, however his CO2 was 22.  He does have 20 ketones in his urine, however this was obtained later in the visit after he had been n.p.o.  Patient does not appear to be in DKA at this time.  His nausea was treated with Reglan, and he was given Benadryl to help with the restlessness associated with that.  After this EKG was obtained and he had a borderline prolonged QT, therefore as he remained nauseous without additional vomiting additional antiemetics were held.  CBC with mild elevation in white count, however suspect that this is reactive given vomiting.  Remainder of electrolytes, other than glucose, are without significant electrolyte abnormalities.  Lipase is not elevated, normal AST and ALT.  She was treated in the emergency room with antiemetics, fentanyl, and GI cocktail.  He was able to pass p.o. challenge.  He was observed for multiple hours.  CT abdomen was offered to patient, however he declined.  Patient has close follow-up, he has an appointment with his GI doctor tomorrow morning, he was encouraged to keep this appointment.  Return precautions were discussed with patient who states their  understanding.  At the time of discharge patient denied any unaddressed complaints or concerns.  Patient is agreeable for discharge home.   Final Clinical Impressions(s) / ED Diagnoses   Final diagnoses:  Non-intractable vomiting with nausea, unspecified vomiting type  Hyperglycemia  Gastroparesis  Long QT interval    ED Discharge Orders    None       Ollen Gross 07/16/18 1543    Charlesetta Shanks, MD 07/18/18 2213

## 2018-07-17 ENCOUNTER — Encounter (INDEPENDENT_AMBULATORY_CARE_PROVIDER_SITE_OTHER): Payer: Self-pay

## 2018-07-17 ENCOUNTER — Ambulatory Visit: Payer: Self-pay | Admitting: Gastroenterology

## 2018-07-17 ENCOUNTER — Encounter: Payer: Self-pay | Admitting: Gastroenterology

## 2018-07-17 VITALS — BP 122/74 | HR 62 | Ht 71.5 in | Wt 233.4 lb

## 2018-07-17 DIAGNOSIS — IMO0001 Reserved for inherently not codable concepts without codable children: Secondary | ICD-10-CM

## 2018-07-17 DIAGNOSIS — K210 Gastro-esophageal reflux disease with esophagitis: Secondary | ICD-10-CM

## 2018-07-17 DIAGNOSIS — R112 Nausea with vomiting, unspecified: Secondary | ICD-10-CM

## 2018-07-17 DIAGNOSIS — K208 Other esophagitis without bleeding: Secondary | ICD-10-CM

## 2018-07-17 DIAGNOSIS — Z794 Long term (current) use of insulin: Secondary | ICD-10-CM

## 2018-07-17 DIAGNOSIS — K219 Gastro-esophageal reflux disease without esophagitis: Secondary | ICD-10-CM

## 2018-07-17 DIAGNOSIS — E119 Type 2 diabetes mellitus without complications: Secondary | ICD-10-CM

## 2018-07-17 MED ORDER — PANTOPRAZOLE SODIUM 40 MG PO TBEC: 40.0000 mg | DELAYED_RELEASE_TABLET | Freq: Two times a day (BID) | ORAL | 11 refills | Status: DC

## 2018-07-17 MED ORDER — METOCLOPRAMIDE HCL 10 MG PO TABS: 10.0000 mg | ORAL_TABLET | Freq: Three times a day (TID) | ORAL | 0 refills | Status: DC

## 2018-07-17 NOTE — Progress Notes (Addendum)
07/17/2018 WACO FOERSTER [id number] [birth year]   HISTORY OF PRESENT ILLNESS: This is a 47 year old male who is a patient of Dr. Vena Rua from recent hospitalization.  He was admitted to the hospital for DKA.  He has been insulin-dependent diabetic for at least 15 years.  He underwent EGD on July 21 at which time he was found to have severe LA grade D erosive/ulcerative esophagitis.  Biopsies confirmed such and were negative for fungal organisms.  Plan was to continue twice daily PPI for 12 weeks, Carafate suspension until symptoms were improved/resolved, and Carafate 10 mg before meals and at bedtime for 2 weeks.  He was then to be scheduled for repeat EGD at approximately 12 weeks.  He presents to our office today for follow-up.  He was in the ER again yesterday for nausea, vomiting, and reflux type symptoms.  He has been off of the PPI as they had only prescribed him enough for 2 weeks.  The only medication he is still taking is the Carafate suspension.   Past Medical History:  Diagnosis Date  . Chronic low back pain 11/30/2015  . Diabetic peripheral neuropathy ([place]) 11/30/2015  . Hypertension   . Neuropathy   . Type 2 diabetes mellitus ([place])    Past Surgical History:  Procedure Laterality Date  . back lipoma resection  03/2006  . BIOPSY  06/15/2018   Procedure: BIOPSY;  Surgeon: Jerene Bears, MD;  Location[facility] ENDOSCOPY;  Service: Gastroenterology;;  . ESOPHAGOGASTRODUODENOSCOPY (EGD) WITH PROPOFOL N/A 06/15/2018   Procedure: ESOPHAGOGASTRODUODENOSCOPY (EGD) WITH PROPOFOL;  Surgeon: Jerene Bears, MD;  Location: [place];  Service: Gastroenterology;  Laterality: N/A;  . KNEE ARTHROSCOPY  10/2008   left  . LEFT HEART CATHETERIZATION WITH CORONARY ANGIOGRAM N/A 08/11/2013   Procedure: LEFT HEART CATHETERIZATION WITH CORONARY ANGIOGRAM;  Surgeon: Pixie Casino, MD;  Location: [facility] CATH LAB;  Service: Cardiovascular;  Laterality: N/A;  . UPPER GI ENDOSCOPY  10/2007   showed gastritis      reports that he has been smoking. He has a 10.00 pack-year smoking history. His smokeless tobacco use includes chew. He reports that he does not drink alcohol or use drugs. family history includes Alcohol abuse in his brother; Brain cancer in his unknown relative; Diabetes in his mother; Hyperlipidemia in his father; Hypertension in his father and mother; Stomach cancer in his unknown relative. Allergies  Allergen Reactions  . Tylox [Oxycodone-Acetaminophen] Nausea And Vomiting      Outpatient Encounter Medications as of 07/17/2018  Medication Sig  . acetaminophen (TYLENOL) 500 MG tablet Take 1,000 mg by mouth every 8 (eight) hours as needed for mild pain or headache.  Marland Kitchen amLODipine (NORVASC) 5 MG tablet Take 5 mg by mouth daily.  . hydrOXYzine (ATARAX/VISTARIL) 25 MG tablet Take 25 mg by mouth at bedtime as needed for anxiety (sleep).   . insulin NPH Human (HUMULIN N,NOVOLIN N) 100 UNIT/ML injection Inject 30-40 Units into the skin See admin instructions. Use 30 units every morning then use 40 units every evening  . insulin regular (NOVOLIN R,HUMULIN R) 100 units/mL injection Inject 4-8 Units into the skin 3 (three) times daily before meals. Sliding scale  . metoprolol succinate (TOPROL-XL) 25 MG 24 hr tablet Take 25 mg by mouth daily.  Marland Kitchen PARoxetine (PAXIL) 10 MG tablet Take 10 mg by mouth 2 (two) times daily.  . sucralfate (CARAFATE) 1 GM/10ML suspension Take 10 mLs (1 g total) by mouth 4 (four) times daily -  with  meals and at bedtime.  . metoCLOPramide (REGLAN) 10 MG tablet Take 1 tablet (10 mg total) by mouth 4 (four) times daily -  before meals and at bedtime for 15 days.  . [DISCONTINUED] calcium carbonate (TUMS EX) 750 MG chewable tablet Chew 2 tablets by mouth as needed for heartburn.  . [DISCONTINUED] pantoprazole (PROTONIX) 40 MG tablet Take 1 tablet (40 mg total) by mouth 2 (two) times daily before a meal. (Patient not taking: Reported on 07/17/2018)   No facility-administered  encounter medications on file as of 07/17/2018.      REVIEW OF SYSTEMS  : All other systems reviewed and negative except where noted in the History of Present Illness.   PHYSICAL EXAM: BP 122/74   Pulse 62   Ht 5' 11.5" (1.816 m)   Wt 233 lb 6.4 oz (105.9 kg)   BMI 32.10 kg/m  General: Well developed white male in no acute distress Head: Normocephalic and atraumatic Eyes:  Sclerae anicteric, conjunctiva pink. Ears: Normal auditory acuity Lungs: Clear throughout to auscultation; no increased WOB. Heart: Regular rate and rhythm; no M/R/G. Abdomen: Soft, non-distended.  BS present.  Non-tender.  Musculoskeletal: Symmetrical with no gross deformities  Skin: No lesions on visible extremities Extremities: No edema  Neurological: Alert oriented x 4, grossly non-focal Psychological:  Alert and cooperative. Normal mood and affect  ASSESSMENT AND PLAN: *Severe reflux esophagitis, GERD: has IDDM and suspected gastroparesis.  Was supposed to be on BID PPI x [redacted] weeks along with carafate suspension, and reglan 10 mg ACHS for 2 weeks.  He only took the PPI for 2 weeks because that was all that was prescribed.  Off the reglan as well.  Still using carafate suspension.  Will restart PPI BID.  Will continue carafate for now until completed current prescription.  Will start reglan for 2 more weeks as well.  Will schedule for repeat EGD in late October or early November.    **The risks, benefits, and alternatives to EGD were discussed with the patient and he consents to proceed.   CC:  [name], MD   Addendum: Reviewed and agree with management. Pyrtle, Lajuan Lines, MD

## 2018-07-17 NOTE — Patient Instructions (Addendum)
You have been scheduled for an endoscopy. Please follow written instructions given to you at your visit today. If you use inhalers (even only as needed), please bring them with you on the day of your procedure. Your physician has requested that you go to www.startemmi.com and enter the access code given to you at your visit today. This web site gives a general overview about your procedure. However, you should still follow specific instructions given to you by our office regarding your preparation for the procedure.  We have sent the following medications to your pharmacy for you to pick up at your convenience:  Reglan 10 mg before meals and at bedtime  Pantoprazole 40 mg twice a day.

## 2018-07-23 ENCOUNTER — Telehealth: Payer: Self-pay | Admitting: Gastroenterology

## 2018-07-23 ENCOUNTER — Encounter (HOSPITAL_COMMUNITY): Payer: Self-pay | Admitting: Emergency Medicine

## 2018-07-23 ENCOUNTER — Inpatient Hospital Stay (HOSPITAL_COMMUNITY)
Admission: EM | Admit: 2018-07-23 | Discharge: 2018-07-30 | DRG: 639 | Disposition: A | Discharge status: Home / Self Care | Payer: Self-pay | Attending: Family Medicine | Admitting: Family Medicine

## 2018-07-23 DIAGNOSIS — I10 Essential (primary) hypertension: Secondary | ICD-10-CM | POA: Diagnosis present

## 2018-07-23 DIAGNOSIS — F17210 Nicotine dependence, cigarettes, uncomplicated: Secondary | ICD-10-CM | POA: Diagnosis present

## 2018-07-23 DIAGNOSIS — E1169 Type 2 diabetes mellitus with other specified complication: Secondary | ICD-10-CM | POA: Diagnosis present

## 2018-07-23 DIAGNOSIS — R112 Nausea with vomiting, unspecified: Secondary | ICD-10-CM | POA: Diagnosis present

## 2018-07-23 DIAGNOSIS — E1143 Type 2 diabetes mellitus with diabetic autonomic (poly)neuropathy: Secondary | ICD-10-CM | POA: Diagnosis present

## 2018-07-23 DIAGNOSIS — R011 Cardiac murmur, unspecified: Secondary | ICD-10-CM | POA: Diagnosis present

## 2018-07-23 DIAGNOSIS — Z885 Allergy status to narcotic agent status: Secondary | ICD-10-CM

## 2018-07-23 DIAGNOSIS — E1110 Type 2 diabetes mellitus with ketoacidosis without coma: Principal | ICD-10-CM | POA: Diagnosis present

## 2018-07-23 DIAGNOSIS — Z794 Long term (current) use of insulin: Secondary | ICD-10-CM

## 2018-07-23 DIAGNOSIS — Z833 Family history of diabetes mellitus: Secondary | ICD-10-CM

## 2018-07-23 DIAGNOSIS — G629 Polyneuropathy, unspecified: Secondary | ICD-10-CM

## 2018-07-23 DIAGNOSIS — K3184 Gastroparesis: Secondary | ICD-10-CM | POA: Diagnosis present

## 2018-07-23 DIAGNOSIS — E1165 Type 2 diabetes mellitus with hyperglycemia: Secondary | ICD-10-CM

## 2018-07-23 DIAGNOSIS — K208 Other esophagitis without bleeding: Secondary | ICD-10-CM | POA: Diagnosis present

## 2018-07-23 DIAGNOSIS — F329 Major depressive disorder, single episode, unspecified: Secondary | ICD-10-CM | POA: Diagnosis present

## 2018-07-23 DIAGNOSIS — M545 Low back pain, unspecified: Secondary | ICD-10-CM | POA: Diagnosis present

## 2018-07-23 DIAGNOSIS — R9431 Abnormal electrocardiogram [ECG] [EKG]: Secondary | ICD-10-CM | POA: Diagnosis present

## 2018-07-23 DIAGNOSIS — E785 Hyperlipidemia, unspecified: Secondary | ICD-10-CM | POA: Diagnosis present

## 2018-07-23 DIAGNOSIS — Z8719 Personal history of other diseases of the digestive system: Secondary | ICD-10-CM

## 2018-07-23 DIAGNOSIS — Z8349 Family history of other endocrine, nutritional and metabolic diseases: Secondary | ICD-10-CM

## 2018-07-23 DIAGNOSIS — E119 Type 2 diabetes mellitus without complications: Secondary | ICD-10-CM

## 2018-07-23 DIAGNOSIS — E876 Hypokalemia: Secondary | ICD-10-CM | POA: Diagnosis present

## 2018-07-23 DIAGNOSIS — R1115 Cyclical vomiting syndrome unrelated to migraine: Secondary | ICD-10-CM

## 2018-07-23 DIAGNOSIS — D72829 Elevated white blood cell count, unspecified: Secondary | ICD-10-CM | POA: Diagnosis present

## 2018-07-23 DIAGNOSIS — IMO0001 Reserved for inherently not codable concepts without codable children: Secondary | ICD-10-CM | POA: Diagnosis present

## 2018-07-23 DIAGNOSIS — Z8249 Family history of ischemic heart disease and other diseases of the circulatory system: Secondary | ICD-10-CM

## 2018-07-23 DIAGNOSIS — E869 Volume depletion, unspecified: Secondary | ICD-10-CM | POA: Diagnosis present

## 2018-07-23 DIAGNOSIS — K219 Gastro-esophageal reflux disease without esophagitis: Secondary | ICD-10-CM | POA: Diagnosis present

## 2018-07-23 DIAGNOSIS — E1142 Type 2 diabetes mellitus with diabetic polyneuropathy: Secondary | ICD-10-CM | POA: Diagnosis present

## 2018-07-23 DIAGNOSIS — R109 Unspecified abdominal pain: Secondary | ICD-10-CM | POA: Diagnosis present

## 2018-07-23 DIAGNOSIS — K920 Hematemesis: Secondary | ICD-10-CM | POA: Diagnosis present

## 2018-07-23 DIAGNOSIS — F419 Anxiety disorder, unspecified: Secondary | ICD-10-CM | POA: Diagnosis present

## 2018-07-23 DIAGNOSIS — Z79899 Other long term (current) drug therapy: Secondary | ICD-10-CM

## 2018-07-23 DIAGNOSIS — E1065 Type 1 diabetes mellitus with hyperglycemia: Secondary | ICD-10-CM

## 2018-07-23 DIAGNOSIS — G8929 Other chronic pain: Secondary | ICD-10-CM | POA: Diagnosis present

## 2018-07-23 LAB — BLOOD GAS, VENOUS
ACID-BASE DEFICIT: 1.6 mmol/L (ref 0.0–2.0)
BICARBONATE: 21.2 mmol/L (ref 20.0–28.0)
O2 SAT: 95.1 %
PCO2 VEN: 32.4 mmHg — AB (ref 44.0–60.0)
Patient temperature: 98.6
pH, Ven: 7.431 — ABNORMAL HIGH (ref 7.250–7.430)
pO2, Ven: 76.5 mmHg — ABNORMAL HIGH (ref 32.0–45.0)

## 2018-07-23 LAB — COMPREHENSIVE METABOLIC PANEL
ALT: 17 U/L (ref 0–44)
AST: 21 U/L (ref 15–41)
Albumin: 4.6 g/dL (ref 3.5–5.0)
Alkaline Phosphatase: 76 U/L (ref 38–126)
Anion gap: 17 — ABNORMAL HIGH (ref 5–15)
BILIRUBIN TOTAL: 1.2 mg/dL (ref 0.3–1.2)
BUN: 8 mg/dL (ref 6–20)
CALCIUM: 9.9 mg/dL (ref 8.9–10.3)
CHLORIDE: 100 mmol/L (ref 98–111)
CO2: 21 mmol/L — ABNORMAL LOW (ref 22–32)
CREATININE: 0.66 mg/dL (ref 0.61–1.24)
Glucose, Bld: 327 mg/dL — ABNORMAL HIGH (ref 70–99)
Potassium: 3.8 mmol/L (ref 3.5–5.1)
Sodium: 138 mmol/L (ref 135–145)
TOTAL PROTEIN: 8.1 g/dL (ref 6.5–8.1)

## 2018-07-23 LAB — URINALYSIS, ROUTINE W REFLEX MICROSCOPIC
Bilirubin Urine: NEGATIVE
Glucose, UA: >=500 mg/dL — AB
Hgb urine dipstick: NEGATIVE
KETONES UR: 80 mg/dL — AB
LEUKOCYTES UA: NEGATIVE
Nitrite: NEGATIVE
PROTEIN: NEGATIVE mg/dL
Specific Gravity, Urine: 1.02 (ref 1.005–1.030)
pH: 7 (ref 5.0–8.0)

## 2018-07-23 LAB — CBC
HCT: 47.7 % (ref 39.0–52.0)
Hemoglobin: 17.2 g/dL — ABNORMAL HIGH (ref 13.0–17.0)
MCH: 30.8 pg (ref 26.0–34.0)
MCHC: 36.1 g/dL — ABNORMAL HIGH (ref 30.0–36.0)
MCV: 85.5 fL (ref 78.0–100.0)
PLATELETS: 239 10*3/uL (ref 150–400)
RBC: 5.58 MIL/uL (ref 4.22–5.81)
RDW: 11.8 % (ref 11.5–15.5)
WBC: 13.5 10*3/uL — AB (ref 4.0–10.5)

## 2018-07-23 LAB — CBG MONITORING, ED: Glucose-Capillary: 273 mg/dL — ABNORMAL HIGH (ref 70–99)

## 2018-07-23 LAB — LIPASE, BLOOD: LIPASE: 30 U/L (ref 11–51)

## 2018-07-23 LAB — URINALYSIS, MICROSCOPIC (REFLEX)
BACTERIA UA: NONE SEEN
SQUAMOUS EPITHELIAL / LPF: NONE SEEN (ref 0–5)

## 2018-07-23 MED ORDER — ONDANSETRON 4 MG PO TBDP
4.0000 mg | ORAL_TABLET | Freq: Once | ORAL | Status: AC | PRN
  Administered 2018-07-23: 4 mg via ORAL
  Filled 2018-07-23: qty 1

## 2018-07-23 MED ORDER — SODIUM CHLORIDE 0.9 % IV BOLUS
1000.0000 mL | Freq: Once | INTRAVENOUS | Status: AC
  Administered 2018-07-23: 1000 mL via INTRAVENOUS

## 2018-07-23 MED ORDER — PROCHLORPERAZINE EDISYLATE 10 MG/2ML IJ SOLN
10.0000 mg | Freq: Once | INTRAMUSCULAR | Status: AC
  Administered 2018-07-23: 10 mg via INTRAVENOUS
  Filled 2018-07-23: qty 2

## 2018-07-23 MED ORDER — DEXTROSE-NACL 5-0.45 % IV SOLN: INTRAVENOUS | Status: DC

## 2018-07-23 MED ORDER — SODIUM CHLORIDE 0.9 % IV SOLN
INTRAVENOUS | Status: DC
  Administered 2018-07-24: 2.2 [IU]/h via INTRAVENOUS
  Filled 2018-07-23 (×2): qty 1

## 2018-07-23 MED ORDER — DIPHENHYDRAMINE HCL 50 MG/ML IJ SOLN
25.0000 mg | Freq: Once | INTRAMUSCULAR | Status: AC
  Administered 2018-07-23: 25 mg via INTRAVENOUS
  Filled 2018-07-23: qty 1

## 2018-07-23 MED ORDER — CAPSAICIN 0.025 % EX CREA
TOPICAL_CREAM | Freq: Once | CUTANEOUS | Status: AC
  Administered 2018-07-23: 22:00:00 via TOPICAL
  Filled 2018-07-23: qty 60

## 2018-07-23 MED ORDER — METOCLOPRAMIDE HCL 5 MG/ML IJ SOLN
10.0000 mg | Freq: Once | INTRAMUSCULAR | Status: AC
  Administered 2018-07-23: 10 mg via INTRAVENOUS
  Filled 2018-07-23: qty 2

## 2018-07-23 NOTE — ED Notes (Signed)
Pt. CBG 273, RN,Allan made aware.

## 2018-07-23 NOTE — ED Provider Notes (Signed)
[facility] DEPT Provider Note   CSN: [id number] Arrival date & time: 07/23/18  1841     History   Chief Complaint Chief Complaint  Patient presents with  . Abdominal Pain  . Emesis    HPI Grant [name] is a 47 y.o. male with history of diabetes on insulin, gastroparesis, esophagitis is here for evaluation of sudden onset nausea, nonbilious nonbloody emesis, generalized abdominal pain onset today.  Vomiting up to 15-20 times.  Pain is epigastric, constant, severe, nonradiating.  Try to take Reglan orally but unable to keep it down due to nausea and vomiting.  Has been compliant with Protonix and Reglan and Carafate as prescribed by GI.  Associated symptoms include sweats while throwing up, increased thirst that he attributes to decreased oral intake of fluids.  Denies associated fevers, chest pain, cough, diarrhea, constipation, blood in stool, dysuria, polyuria.  No history of abdominal surgeries.  Remote history of heavy EtOH use, last intake June 07, 2018.  Denies use of ibuprofen, BC powders, aspirin or other NSAIDs.  HPI  Past Medical History:  Diagnosis Date  . Chronic low back pain 11/30/2015  . Diabetic peripheral neuropathy ([place]) 11/30/2015  . Hypertension   . Neuropathy   . Type 2 diabetes mellitus [facility])     Patient Active Problem List   Diagnosis Date Noted  . Gastroesophageal reflux disease 07/17/2018  . Long QT interval 07/16/2018  . Early satiety   . Esophagitis, [place] grade D   . Hypercalcemia 06/09/2018  . SIRS (systemic inflammatory response syndrome) ([place]) 06/09/2018  . Hematemesis 06/09/2018  . Type 2 diabetes mellitus with peripheral neuropathy ([place]) 06/09/2018  . Acidosis 09/17/2016  . Dehydration 09/17/2016  . Abdominal pain 09/17/2016  . DKA (diabetic ketoacidoses) ([place]) 09/17/2016  . Nausea and vomiting 09/17/2016  . IDDM (insulin dependent diabetes mellitus) ([place])   . Gastroparesis   . Diabetic peripheral  neuropathy ([place]) 11/30/2015  . Chronic low back pain 11/30/2015  . Coronary artery spasm ([place]) 08/17/2013  . Hyperlipidemia 08/17/2013  . Peripheral neuropathy 08/05/2013  . Chest pain 08/05/2013  . Fatigue 08/05/2013  . Tachycardia 08/05/2013  . DOE (dyspnea on exertion) 08/05/2013  . Diabetes mellitus, insulin dependent (IDDM), uncontrolled ([place]) 02/09/2008  . Anxiety state 02/09/2008  . Essential hypertension 02/09/2008  . ALCOHOL ABUSE, HX OF 02/09/2008  . LIVER FUNCTION TESTS, ABNORMAL, HX OF 02/09/2008  . NEOPLASM, BENIGN, ESOPHAGUS 09/16/2007  . Gastritis 09/16/2007    Past Surgical History:  Procedure Laterality Date  . back lipoma resection  03/2006  . BIOPSY  06/15/2018   Procedure: BIOPSY;  Surgeon: Jerene Bears, MD;  Location: [facility] ENDOSCOPY;  Service: Gastroenterology;;  . ESOPHAGOGASTRODUODENOSCOPY (EGD) WITH PROPOFOL N/A 06/15/2018   Procedure: ESOPHAGOGASTRODUODENOSCOPY (EGD) WITH PROPOFOL;  Surgeon: Jerene Bears, MD;  Location: [facility];  Service: Gastroenterology;  Laterality: N/A;  . KNEE ARTHROSCOPY  10/2008   left  . LEFT HEART CATHETERIZATION WITH CORONARY ANGIOGRAM N/A 08/11/2013   Procedure: LEFT HEART CATHETERIZATION WITH CORONARY ANGIOGRAM;  Surgeon: Pixie Casino, MD;  Location[facility] CATH LAB;  Service: Cardiovascular;  Laterality: N/A;  . UPPER GI ENDOSCOPY  10/2007   showed gastritis        Home Medications    Prior to Admission medications   Medication Sig Start Date End Date Taking? Authorizing Provider  amLODipine (NORVASC) 5 MG tablet Take 5 mg by mouth daily. 06/07/18  Yes [provider]  insulin NPH Human (HUMULIN N,NOVOLIN N)  100 UNIT/ML injection Inject 30-40 Units into the skin See admin instructions. Use 30 units every morning then use 40 units every evening   Yes [provider]  insulin regular (NOVOLIN R,HUMULIN R) 100 units/mL injection Inject 4-8 Units into the skin 3 (three) times daily before meals. Sliding  scale   Yes [provider]  metoCLOPramide (REGLAN) 10 MG tablet Take 1 tablet (10 mg total) by mouth 4 (four) times daily -  before meals and at bedtime. For 2 weeks 07/17/18  Yes Zehr, Janett Billow D, PA-C  metoprolol succinate (TOPROL-XL) 25 MG 24 hr tablet Take 25 mg by mouth daily. 06/07/18  Yes [provider]  pantoprazole (PROTONIX) 40 MG tablet Take 1 tablet (40 mg total) by mouth 2 (two) times daily before a meal. 07/17/18  Yes Zehr, Janett Billow D, PA-C  PARoxetine (PAXIL) 10 MG tablet Take 10 mg by mouth 2 (two) times daily. 05/08/18  Yes [provider]  acetaminophen (TYLENOL) 500 MG tablet Take 1,000 mg by mouth every 8 (eight) hours as needed for mild pain or headache.    [provider]  hydrOXYzine (ATARAX/VISTARIL) 25 MG tablet Take 25 mg by mouth at bedtime as needed for anxiety (sleep).  07/11/18   [provider]  sucralfate (CARAFATE) 1 GM/10ML suspension Take 10 mLs (1 g total) by mouth 4 (four) times daily -  with meals and at bedtime. 06/17/18 07/17/18  Arrien, Jimmy Picket, MD    Family History Family History  Problem Relation Age of Onset  . Diabetes Mother   . Hypertension Mother   . Hypertension Father   . Hyperlipidemia Father   . Alcohol abuse Brother   . Brain cancer Unknown        grandparent  . Stomach cancer Unknown        grandparent    Social History Social History   Tobacco Use  . Smoking status: Current Every Day Smoker    Packs/day: 0.50    Years: 20.00    Pack years: 10.00  . Smokeless tobacco: Current User    Types: Chew  . Tobacco comment: 5-7 cigarettes/day; chews occasionally  Substance Use Topics  . Alcohol use: No  . Drug use: No     Allergies   Tylox [oxycodone-acetaminophen]   Review of Systems Review of Systems  Gastrointestinal: Positive for abdominal pain, nausea and vomiting.  All other systems reviewed and are negative.    Physical Exam Updated Vital Signs BP (!) 217/113 (BP  Location: Right Arm)   Pulse 100   Resp 20   SpO2 99%   Physical Exam  Constitutional: He is oriented to person, place, and time. He appears well-developed and well-nourished. He appears distressed.  Looks uncomfortable.  Actively dry heaving.  HENT:  Head: Normocephalic and atraumatic.  Right Ear: External ear normal.  Left Ear: External ear normal.  Nose: Nose normal.  Dry lips but MMM.  Eyes: Conjunctivae and EOM are normal.  Neck: Normal range of motion. Neck supple.  Cardiovascular: Normal rate, regular rhythm, normal heart sounds and intact distal pulses.  Pulmonary/Chest: Effort normal and breath sounds normal.  Abdominal: Soft. Normal appearance and bowel sounds are normal. There is tenderness in the epigastric area.  Negative Murphy's.  Negative McBurney's.  Negative suprapubic or CVA tenderness.  Active bowel sounds to lower quadrants.  No G/R/R.  Musculoskeletal: Normal range of motion. He exhibits no deformity.  Neurological: He is alert and oriented to person, place, and time.  Skin:  Skin is warm and dry. Capillary refill takes less than 2 seconds.  Psychiatric: He has a normal mood and affect. His behavior is normal. Judgment and thought content normal.  Nursing note and vitals reviewed.    ED Treatments / Results  Labs (all labs ordered are listed, but only abnormal results are displayed) Labs Reviewed  COMPREHENSIVE METABOLIC PANEL - Abnormal; Notable for the following components:      Result Value   CO2 21 (*)    Glucose, Bld 327 (*)    Anion gap 17 (*)    All other components within normal limits  CBC - Abnormal; Notable for the following components:   WBC 13.5 (*)    Hemoglobin 17.2 (*)    MCHC 36.1 (*)    All other components within normal limits  URINALYSIS, ROUTINE W REFLEX MICROSCOPIC - Abnormal; Notable for the following components:   Glucose, UA >=500 (*)    Ketones, ur 80 (*)    All other components within normal limits  BLOOD GAS, VENOUS -  Abnormal; Notable for the following components:   pH, Ven 7.431 (*)    pCO2, Ven 32.4 (*)    pO2, Ven 76.5 (*)    All other components within normal limits  LIPASE, BLOOD  URINALYSIS, MICROSCOPIC (REFLEX)    EKG None  Radiology No results found.  Procedures .Critical Care Performed by: Kinnie Feil, PA-C Authorized by: Kinnie Feil, PA-C   Critical care provider statement:    Critical care time (minutes):  45   Critical care was necessary to treat or prevent imminent or life-threatening deterioration of the following conditions: DKA.   Critical care was time spent personally by me on the following activities:  Discussions with consultants, evaluation of patient's response to treatment, examination of patient, ordering and performing treatments and interventions, ordering and review of laboratory studies, ordering and review of radiographic studies, pulse oximetry, re-evaluation of patient's condition, obtaining history from patient or surrogate and review of old charts   (including critical care time)  Medications Ordered in ED Medications  insulin regular (NOVOLIN R,HUMULIN R) 100 Units in sodium chloride 0.9 % 100 mL (1 Units/mL) infusion (has no administration in time range)  dextrose 5 %-0.45 % sodium chloride infusion (has no administration in time range)  metoCLOPramide (REGLAN) injection 10 mg (has no administration in time range)  ondansetron (ZOFRAN-ODT) disintegrating tablet 4 mg (4 mg Oral Given 07/23/18 1859)  sodium chloride 0.9 % bolus 1,000 mL (0 mLs Intravenous Stopped 07/23/18 2204)  prochlorperazine (COMPAZINE) injection 10 mg (10 mg Intravenous Given 07/23/18 2036)  diphenhydrAMINE (BENADRYL) injection 25 mg (25 mg Intravenous Given 07/23/18 2036)  capsaicin (ZOSTRIX) 0.025 % cream ( Topical Given 07/23/18 2222)  diphenhydrAMINE (BENADRYL) injection 25 mg (25 mg Intravenous Given 07/23/18 2219)  prochlorperazine (COMPAZINE) injection 10 mg (10 mg  Intravenous Given 07/23/18 2219)     Initial Impression / Assessment and Plan / ED Course  I have reviewed the triage vital signs and the nursing notes.  Pertinent labs & imaging results that were available during my care of the patient were reviewed by me and considered in my medical decision making (see chart for details).  Clinical Course as of Jul 23 2346  Wed Jul 23, 2018  2212 Glucose(!): 327 [CG]  2212 Anion gap(!): 17 [CG]  2212 CO2(!): 21 [CG]    Clinical Course User Index [CG] Kinnie Feil, PA-C   Concern for DKA in this patient versus gastroparesis related  symptoms versus viral syndrome.  Negative Murphy's and McBurney's making cholecystitis and appendicitis less likely.  He has no history of pancreatitis although this is also on differential.  He looks slightly dehydrated on exam and uncomfortable.  Afebrile with epigastric tenderness.  No peritonitis.  Lab work consistent with DKA, CBG 327, anion gap 17, bicarb 21.  K is WNL.  Lipase normal.  Urinalysis with 80 ketones.  Patient is mentating well.  2330: Reevaluated patient and he continues to have intractable nausea despite antiemetics.  EKG with prolonged QTC making antiemetic options limited.  Will request admission for DKA and intractable nausea and vomiting, gastroparesis.  2345: Spoke to Dr Darrick Meigs who will admit for mild DKA, gastroparesis, intractable n/v in setting of QTc prolongation.  Final Clinical Impressions(s) / ED Diagnoses   Final diagnoses:  Intractable cyclical vomiting with nausea  Diabetic ketoacidosis without coma associated with type 2 diabetes mellitus [facility])    ED Discharge Orders    None       Arlean Hopping 07/23/18 2347    Daleen Bo, MD 07/24/18 [phone]

## 2018-07-23 NOTE — ED Notes (Signed)
EKG given to EDP,Tegeler,MD., for review. 

## 2018-07-23 NOTE — Telephone Encounter (Addendum)
Patient's wife expressed that she had reached out around 400 and then again at 5. Received a page about patient's wife around 8PM. I called patient's wife back and she described that patient who was just seen in GI clinic last week and was having increasing issues with N/V and he was not able to tolerate anything. She had called EMS as they did not have other choices since he has not been tolerating the anti-emetics that were prescribed. I told her it was reasonable if he could not tolerate issues. As we were on the phone the EMS was arriving and they were planning on having the patient taken to [facility]. I told her I would relay to [place] and MD Pyrtle who had seen her recently and been involved with her case as well. I will relay this to the Inpatient GI team to be made aware that the patient may be still in the hospital tomorrow. If patient remains in hospital until tomorrow, then GI can be called after 800AM for a consultation; unless it is felt that an urgent/emergent question/need is present at which point GI can be called overnight.  Justice Britain, MD [facility] Office # [phone]

## 2018-07-23 NOTE — Telephone Encounter (Signed)
Left message on machine to call back  

## 2018-07-23 NOTE — ED Triage Notes (Signed)
Per [facility] pt from home for abd pain with n/v. Pt hx gastroparesis and was seen at [facility] ED on 8/21 for same symptoms.

## 2018-07-24 ENCOUNTER — Other Ambulatory Visit: Payer: Self-pay

## 2018-07-24 ENCOUNTER — Inpatient Hospital Stay (HOSPITAL_COMMUNITY): Payer: Self-pay

## 2018-07-24 DIAGNOSIS — I10 Essential (primary) hypertension: Secondary | ICD-10-CM

## 2018-07-24 DIAGNOSIS — G43A1 Cyclical vomiting, intractable: Secondary | ICD-10-CM

## 2018-07-24 DIAGNOSIS — K920 Hematemesis: Secondary | ICD-10-CM

## 2018-07-24 LAB — COMPREHENSIVE METABOLIC PANEL
ALBUMIN: 4.4 g/dL (ref 3.5–5.0)
ALK PHOS: 71 U/L (ref 38–126)
ALT: 15 U/L (ref 0–44)
AST: 29 U/L (ref 15–41)
Anion gap: 11 (ref 5–15)
BUN: 6 mg/dL (ref 6–20)
CALCIUM: 9.3 mg/dL (ref 8.9–10.3)
CO2: 24 mmol/L (ref 22–32)
Chloride: 106 mmol/L (ref 98–111)
Creatinine, Ser: 0.59 mg/dL — ABNORMAL LOW (ref 0.61–1.24)
GFR calc Af Amer: >60 mL/min (ref >60)
GFR calc non Af Amer: >60 mL/min (ref >60)
GLUCOSE: 143 mg/dL — AB (ref 70–99)
Potassium: 3.8 mmol/L (ref 3.5–5.1)
SODIUM: 141 mmol/L (ref 135–145)
Total Bilirubin: 1.4 mg/dL — ABNORMAL HIGH (ref 0.3–1.2)
Total Protein: 7.6 g/dL (ref 6.5–8.1)

## 2018-07-24 LAB — CBC WITH DIFFERENTIAL/PLATELET
BASOS PCT: 0 %
Basophils Absolute: 0 10*3/uL (ref 0.0–0.1)
Eosinophils Absolute: 0 10*3/uL (ref 0.0–0.7)
Eosinophils Relative: 0 %
HCT: 47.9 % (ref 39.0–52.0)
HEMOGLOBIN: 16.9 g/dL (ref 13.0–17.0)
LYMPHS ABS: 2.3 10*3/uL (ref 0.7–4.0)
Lymphocytes Relative: 15 %
MCH: 30.1 pg (ref 26.0–34.0)
MCHC: 35.3 g/dL (ref 30.0–36.0)
MCV: 85.2 fL (ref 78.0–100.0)
Monocytes Absolute: 1.4 10*3/uL — ABNORMAL HIGH (ref 0.1–1.0)
Monocytes Relative: 9 %
NEUTROS PCT: 76 %
Neutro Abs: 11.4 10*3/uL — ABNORMAL HIGH (ref 1.7–7.7)
Platelets: 255 10*3/uL (ref 150–400)
RBC: 5.62 MIL/uL (ref 4.22–5.81)
RDW: 12 % (ref 11.5–15.5)
WBC: 15.1 10*3/uL — AB (ref 4.0–10.5)

## 2018-07-24 LAB — BASIC METABOLIC PANEL
ANION GAP: 12 (ref 5–15)
ANION GAP: 11 (ref 5–15)
ANION GAP: 13 (ref 5–15)
Anion gap: 11 (ref 5–15)
Anion gap: 10 (ref 5–15)
BUN: 7 mg/dL (ref 6–20)
BUN: 6 mg/dL (ref 6–20)
BUN: 5 mg/dL — ABNORMAL LOW (ref 6–20)
BUN: <5 mg/dL — ABNORMAL LOW (ref 6–20)
BUN: 5 mg/dL — ABNORMAL LOW (ref 6–20)
CALCIUM: 9.4 mg/dL (ref 8.9–10.3)
CALCIUM: 8.8 mg/dL — AB (ref 8.9–10.3)
CALCIUM: 8.6 mg/dL — AB (ref 8.9–10.3)
CHLORIDE: 104 mmol/L (ref 98–111)
CHLORIDE: 107 mmol/L (ref 98–111)
CO2: 24 mmol/L (ref 22–32)
CO2: 23 mmol/L (ref 22–32)
CO2: 23 mmol/L (ref 22–32)
CO2: 25 mmol/L (ref 22–32)
CO2: 21 mmol/L — ABNORMAL LOW (ref 22–32)
Calcium: 9.2 mg/dL (ref 8.9–10.3)
Calcium: 9.3 mg/dL (ref 8.9–10.3)
Chloride: 106 mmol/L (ref 98–111)
Chloride: 101 mmol/L (ref 98–111)
Chloride: 106 mmol/L (ref 98–111)
Creatinine, Ser: 0.6 mg/dL — ABNORMAL LOW (ref 0.61–1.24)
Creatinine, Ser: 0.6 mg/dL — ABNORMAL LOW (ref 0.61–1.24)
Creatinine, Ser: 0.57 mg/dL — ABNORMAL LOW (ref 0.61–1.24)
Creatinine, Ser: 0.53 mg/dL — ABNORMAL LOW (ref 0.61–1.24)
Creatinine, Ser: 0.5 mg/dL — ABNORMAL LOW (ref 0.61–1.24)
GFR calc Af Amer: >60 mL/min (ref >60)
GFR calc Af Amer: >60 mL/min (ref >60)
GFR calc non Af Amer: >60 mL/min (ref >60)
GFR calc non Af Amer: >60 mL/min (ref >60)
GFR calc non Af Amer: >60 mL/min (ref >60)
GFR calc non Af Amer: >60 mL/min (ref >60)
GLUCOSE: 191 mg/dL — AB (ref 70–99)
Glucose, Bld: 278 mg/dL — ABNORMAL HIGH (ref 70–99)
Glucose, Bld: 171 mg/dL — ABNORMAL HIGH (ref 70–99)
Glucose, Bld: 139 mg/dL — ABNORMAL HIGH (ref 70–99)
Glucose, Bld: 350 mg/dL — ABNORMAL HIGH (ref 70–99)
POTASSIUM: 3.8 mmol/L (ref 3.5–5.1)
POTASSIUM: 3.6 mmol/L (ref 3.5–5.1)
POTASSIUM: 4.3 mmol/L (ref 3.5–5.1)
POTASSIUM: 7 mmol/L — AB (ref 3.5–5.1)
Potassium: 3.6 mmol/L (ref 3.5–5.1)
SODIUM: 139 mmol/L (ref 135–145)
SODIUM: 139 mmol/L (ref 135–145)
SODIUM: 142 mmol/L (ref 135–145)
SODIUM: 140 mmol/L (ref 135–145)
Sodium: 137 mmol/L (ref 135–145)

## 2018-07-24 LAB — GLUCOSE, CAPILLARY
GLUCOSE-CAPILLARY: 132 mg/dL — AB (ref 70–99)
GLUCOSE-CAPILLARY: 122 mg/dL — AB (ref 70–99)
GLUCOSE-CAPILLARY: 159 mg/dL — AB (ref 70–99)
GLUCOSE-CAPILLARY: 196 mg/dL — AB (ref 70–99)
GLUCOSE-CAPILLARY: 162 mg/dL — AB (ref 70–99)
GLUCOSE-CAPILLARY: 132 mg/dL — AB (ref 70–99)
GLUCOSE-CAPILLARY: 172 mg/dL — AB (ref 70–99)
GLUCOSE-CAPILLARY: 146 mg/dL — AB (ref 70–99)
GLUCOSE-CAPILLARY: 144 mg/dL — AB (ref 70–99)
Glucose-Capillary: 245 mg/dL — ABNORMAL HIGH (ref 70–99)
Glucose-Capillary: 223 mg/dL — ABNORMAL HIGH (ref 70–99)
Glucose-Capillary: 227 mg/dL — ABNORMAL HIGH (ref 70–99)
Glucose-Capillary: 190 mg/dL — ABNORMAL HIGH (ref 70–99)
Glucose-Capillary: 167 mg/dL — ABNORMAL HIGH (ref 70–99)
Glucose-Capillary: 192 mg/dL — ABNORMAL HIGH (ref 70–99)
Glucose-Capillary: 144 mg/dL — ABNORMAL HIGH (ref 70–99)
Glucose-Capillary: 134 mg/dL — ABNORMAL HIGH (ref 70–99)
Glucose-Capillary: 163 mg/dL — ABNORMAL HIGH (ref 70–99)
Glucose-Capillary: 161 mg/dL — ABNORMAL HIGH (ref 70–99)

## 2018-07-24 LAB — CBG MONITORING, ED: Glucose-Capillary: 276 mg/dL — ABNORMAL HIGH (ref 70–99)

## 2018-07-24 LAB — ECHOCARDIOGRAM COMPLETE
HEIGHTINCHES: 71 in
WEIGHTICAEL: 3615.54 [oz_av]

## 2018-07-24 LAB — PHOSPHORUS: Phosphorus: 2 mg/dL — ABNORMAL LOW (ref 2.5–4.6)

## 2018-07-24 LAB — MRSA PCR SCREENING: MRSA by PCR: NEGATIVE

## 2018-07-24 LAB — OCCULT BLOOD GASTRIC / DUODENUM (SPECIMEN CUP)
OCCULT BLOOD, GASTRIC: POSITIVE — AB
pH, Gastric: 2

## 2018-07-24 LAB — MAGNESIUM: Magnesium: 2.3 mg/dL (ref 1.7–2.4)

## 2018-07-24 MED ORDER — PANTOPRAZOLE SODIUM 40 MG IV SOLR
40.0000 mg | Freq: Two times a day (BID) | INTRAVENOUS | Status: DC
  Administered 2018-07-27 – 2018-07-30 (×7): 40 mg via INTRAVENOUS
  Filled 2018-07-24 (×7): qty 40

## 2018-07-24 MED ORDER — POTASSIUM PHOSPHATES 15 MMOLE/5ML IV SOLN
20.0000 mmol | Freq: Once | INTRAVENOUS | Status: AC
  Administered 2018-07-24: 20 mmol via INTRAVENOUS
  Filled 2018-07-24 (×2): qty 6.67

## 2018-07-24 MED ORDER — PROCHLORPERAZINE EDISYLATE 10 MG/2ML IJ SOLN
10.0000 mg | Freq: Four times a day (QID) | INTRAMUSCULAR | Status: DC | PRN
  Administered 2018-07-24 – 2018-07-29 (×13): 10 mg via INTRAVENOUS
  Filled 2018-07-24 (×13): qty 2

## 2018-07-24 MED ORDER — SUCRALFATE 1 GM/10ML PO SUSP
1.0000 g | Freq: Four times a day (QID) | ORAL | Status: DC
  Administered 2018-07-25 – 2018-07-30 (×21): 1 g via ORAL
  Filled 2018-07-24 (×22): qty 10

## 2018-07-24 MED ORDER — LORAZEPAM 2 MG/ML IJ SOLN
0.5000 mg | Freq: Four times a day (QID) | INTRAMUSCULAR | Status: DC | PRN
  Administered 2018-07-24 (×2): 0.5 mg via INTRAVENOUS
  Filled 2018-07-24 (×2): qty 1

## 2018-07-24 MED ORDER — METOCLOPRAMIDE HCL 5 MG/ML IJ SOLN
10.0000 mg | Freq: Three times a day (TID) | INTRAMUSCULAR | Status: DC
  Administered 2018-07-24: 10 mg via INTRAVENOUS
  Filled 2018-07-24: qty 2

## 2018-07-24 MED ORDER — HYDRALAZINE HCL 20 MG/ML IJ SOLN
10.0000 mg | Freq: Four times a day (QID) | INTRAMUSCULAR | Status: DC | PRN
  Administered 2018-07-25: 10 mg via INTRAVENOUS
  Filled 2018-07-24: qty 1

## 2018-07-24 MED ORDER — PROMETHAZINE HCL 25 MG/ML IJ SOLN
12.5000 mg | Freq: Once | INTRAMUSCULAR | Status: AC | PRN
  Administered 2018-07-24: 12.5 mg via INTRAVENOUS
  Filled 2018-07-24: qty 1

## 2018-07-24 MED ORDER — HYDROXYZINE HCL 25 MG PO TABS
25.0000 mg | ORAL_TABLET | Freq: Once | ORAL | Status: AC
  Administered 2018-07-24: 25 mg via ORAL
  Filled 2018-07-24: qty 1

## 2018-07-24 MED ORDER — PROMETHAZINE HCL 25 MG/ML IJ SOLN
12.5000 mg | Freq: Four times a day (QID) | INTRAMUSCULAR | Status: DC | PRN
  Administered 2018-07-24 (×3): 12.5 mg via INTRAVENOUS
  Filled 2018-07-24 (×3): qty 1

## 2018-07-24 MED ORDER — SCOPOLAMINE 1 MG/3DAYS TD PT72
1.0000 | MEDICATED_PATCH | TRANSDERMAL | Status: DC
  Administered 2018-07-24 – 2018-07-27 (×2): 1.5 mg via TRANSDERMAL
  Filled 2018-07-24 (×3): qty 1

## 2018-07-24 MED ORDER — SODIUM CHLORIDE 0.9 % IV BOLUS
1000.0000 mL | Freq: Once | INTRAVENOUS | Status: AC
  Administered 2018-07-24: 1000 mL via INTRAVENOUS

## 2018-07-24 MED ORDER — SODIUM CHLORIDE 0.9 % IV SOLN
INTRAVENOUS | Status: DC
  Administered 2018-07-25: 2.3 [IU]/h via INTRAVENOUS
  Filled 2018-07-24 (×6): qty 1

## 2018-07-24 MED ORDER — METOCLOPRAMIDE HCL 5 MG/ML IJ SOLN
10.0000 mg | Freq: Two times a day (BID) | INTRAMUSCULAR | Status: DC
  Administered 2018-07-24 – 2018-07-25 (×2): 10 mg via INTRAVENOUS
  Filled 2018-07-24 (×2): qty 2

## 2018-07-24 MED ORDER — MAGNESIUM SULFATE 2 GM/50ML IV SOLN
2.0000 g | Freq: Once | INTRAVENOUS | Status: AC
  Administered 2018-07-24: 2 g via INTRAVENOUS
  Filled 2018-07-24: qty 50

## 2018-07-24 MED ORDER — HYOSCYAMINE SULFATE 0.125 MG SL SUBL
0.2500 mg | SUBLINGUAL_TABLET | Freq: Four times a day (QID) | SUBLINGUAL | Status: DC | PRN
  Administered 2018-07-24: 0.25 mg via SUBLINGUAL
  Filled 2018-07-24 (×4): qty 2

## 2018-07-24 MED ORDER — SODIUM CHLORIDE 0.9 % IV SOLN
INTRAVENOUS | Status: DC
  Administered 2018-07-24: 02:00:00 via INTRAVENOUS

## 2018-07-24 MED ORDER — METOPROLOL TARTRATE 5 MG/5ML IV SOLN
2.5000 mg | Freq: Three times a day (TID) | INTRAVENOUS | Status: DC
  Administered 2018-07-24 – 2018-07-28 (×13): 2.5 mg via INTRAVENOUS
  Filled 2018-07-24 (×13): qty 5

## 2018-07-24 MED ORDER — POTASSIUM CHLORIDE 10 MEQ/100ML IV SOLN
10.0000 meq | INTRAVENOUS | Status: AC
  Administered 2018-07-24 (×2): 10 meq via INTRAVENOUS
  Filled 2018-07-24 (×2): qty 100

## 2018-07-24 MED ORDER — DEXTROSE-NACL 5-0.45 % IV SOLN
INTRAVENOUS | Status: DC
  Administered 2018-07-24 – 2018-07-25 (×4): via INTRAVENOUS

## 2018-07-24 NOTE — Care Management Note (Signed)
Case Management Note  Patient Details  Name: Grant [name] MRN: [id number] Date of Birth: [birth year]  Subjective/Objective:                  From chart:\ 47 yo male admitted with coffee ground emesis. Hemodynamically stable, hgb actually high on admit but likely volume depletion. He has known SEVERE ulcerative esophagitis seen on EGD 06/15/18 and was off PPI for a couple of weeks ( out of meds). He has DM with peripheral neuropathy, probably has some degree of diabetic gastroparesis as well raising his risk of reflux.   -I don't know what repeat EGD this soon will add value / change management. He is scheduled to have follow up EGD in late October.  -The main thing is getting his N/V under control.  -Agree with BID IV PPI. -He needs scheduled anti-emetics but prolonged QT interval increases risk of arrhythmias -IV Reglan 10 mg ac and HS -Can resume Carafate if tolerates it.  -If above not helpful then consider trial of IV erythromycin -Anti-reflux precautions with HOB elevated.  -One other thing to consider is whether he is absorbing enough of the Paxil with the vomiting. If has a very short half life and missed (or unabsorbed dose) can result in N/V   HPI: [name] is a 47 y.o. male with DM with admissions for DKA, peripheral neuropathy, HTN, hx of Etoh abuse. He was hospitalized late July for Etoh withdrawal / N/V/ hematemesis.  Inpatient EGD 06/15/18 revealed severe [place] grade D erosive esophagitis, normal stomach. No fungal organisms on biopsies. He was treated with carafate, high dose PPI and Reglan and scheduled for repeat EGD in 12 weeks. Patient presented to office in the interim, on 07/17/18 for N/V and reflux type symptoms, he had been in the ED the day prior. He had been off PPI for a couple of weeks due to insufficient supply of medication prescribed at discharge but was still taking the carafate. We restarted BID PPI, continued BID PPI and resumed Reglan for an additional 2 weeks.  He was scheduled for repeat EGD in late October.   Yesterday patient's wife called EMS as patient wasn't tolerating any PO. He was brought to ED and admitted for N/V and coffee ground emesis. He describes ~ 5 episodes of coffee ground emesis, no blood in stools / black stools. He has epigastric discomfort at times. Based on weights in EMR, his weight is overall stable, it has fluctuated ~ 10 pounds over last few months.   ED evaluation:  BP 217/113, 100, afebrile Glucose 327 Anion gap 17 C02 21 Lipase and liver chemistries unremarkable.  WBC 13.5 Hgb 17.2  Action/Plan: Cm following for discharge needs and progression/  Expected Discharge Date:                  Expected Discharge Plan:  Home/Self Care  In-House Referral:     Discharge planning Services  CM Consult  Post Acute Care Choice:    Choice offered to:     DME Arranged:    DME Agency:     HH Arranged:    HH Agency:     Status of Service:  In process, will continue to follow  If discussed at Long Length of Stay Meetings, dates discussed:    Additional Comments:  Leeroy Cha, RN 07/24/2018, 10:42 AM

## 2018-07-24 NOTE — Telephone Encounter (Signed)
The pt has been admitted into the hospital.  

## 2018-07-24 NOTE — H&P (Signed)
TRH H&P    Patient Demographics:    Grant [name], is a 47 y.o. male  MRN: [id number]  DOB - August 31, 1971  Admit Date - 07/23/2018  Referring MD/NP/PA: Dr. Eulis Foster  Outpatient Primary MD for the patient is [name], MD  Patient coming from: Home  Chief complaint-intractable nausea vomiting   HPI:    Grant [name]  is a 47 y.o. male, with history of hypertension, dyslipidemia, type 2 diabetes mellitus, depression, peripheral neuropathy, chronic back pain who was recently discharged from hospital on 06/17/2018 after he was diagnosed with acute ulcerative and erosive esophagitis seen on the EGD on 06/15/2018.  Patient was discharged on Protonix twice daily, Reglan and Carafate.  Patient was seen by GI last week in the clinic for ongoing nausea vomiting.  Patient says that intermittently improved but started throwing up this morning.  He complains of more nausea.  Also having coffee-ground emesis. Denies chest pain or shortness of breath Denies fever or dysuria. Denies passing out.  No blurred vision  In the ED, lab work revealed mild DKA with anion gap of 17, bicarb 21. Patient started on DKA protocol    Review of systems:      All other systems reviewed and are negative.   With Past History of the following :    Past Medical History:  Diagnosis Date  . Chronic low back pain 11/30/2015  . Diabetic peripheral neuropathy ([place]) 11/30/2015  . Hypertension   . Neuropathy   . Type 2 diabetes mellitus ([place])       Past Surgical History:  Procedure Laterality Date  . back lipoma resection  03/2006  . BIOPSY  06/15/2018   Procedure: BIOPSY;  Surgeon: Jerene Bears, MD;  Location: [facility] ENDOSCOPY;  Service: Gastroenterology;;  . ESOPHAGOGASTRODUODENOSCOPY (EGD) WITH PROPOFOL N/A 06/15/2018   Procedure: ESOPHAGOGASTRODUODENOSCOPY (EGD) WITH PROPOFOL;  Surgeon: Jerene Bears, MD;  Location: [facility];  Service:  Gastroenterology;  Laterality: N/A;  . KNEE ARTHROSCOPY  10/2008   left  . LEFT HEART CATHETERIZATION WITH CORONARY ANGIOGRAM N/A 08/11/2013   Procedure: LEFT HEART CATHETERIZATION WITH CORONARY ANGIOGRAM;  Surgeon: Pixie Casino, MD;  Location: [facility] CATH LAB;  Service: Cardiovascular;  Laterality: N/A;  . UPPER GI ENDOSCOPY  10/2007   showed gastritis      Social History:      Social History   Tobacco Use  . Smoking status: Current Every Day Smoker    Packs/day: 0.50    Years: 20.00    Pack years: 10.00  . Smokeless tobacco: Current User    Types: Chew  . Tobacco comment: 5-7 cigarettes/day; chews occasionally  Substance Use Topics  . Alcohol use: No       Family History :     Family History  Problem Relation Age of Onset  . Diabetes Mother   . Hypertension Mother   . Hypertension Father   . Hyperlipidemia Father   . Alcohol abuse Brother   . Brain cancer Unknown        grandparent  . Stomach  cancer Unknown        grandparent      Home Medications:   Prior to Admission medications   Medication Sig Start Date End Date Taking? Authorizing Provider  amLODipine (NORVASC) 5 MG tablet Take 5 mg by mouth daily. 06/07/18  Yes [provider]  insulin NPH Human (HUMULIN N,NOVOLIN N) 100 UNIT/ML injection Inject 30-40 Units into the skin See admin instructions. Use 30 units every morning then use 40 units every evening   Yes [provider]  insulin regular (NOVOLIN R,HUMULIN R) 100 units/mL injection Inject 4-8 Units into the skin 3 (three) times daily before meals. Sliding scale   Yes [provider]  metoCLOPramide (REGLAN) 10 MG tablet Take 1 tablet (10 mg total) by mouth 4 (four) times daily -  before meals and at bedtime. For 2 weeks 07/17/18  Yes Zehr, Janett Billow D, PA-C  metoprolol succinate (TOPROL-XL) 25 MG 24 hr tablet Take 25 mg by mouth daily. 06/07/18  Yes [provider]  pantoprazole (PROTONIX) 40 MG tablet Take 1 tablet (40  mg total) by mouth 2 (two) times daily before a meal. 07/17/18  Yes Zehr, Janett Billow D, PA-C  PARoxetine (PAXIL) 10 MG tablet Take 10 mg by mouth 2 (two) times daily. 05/08/18  Yes [provider]  acetaminophen (TYLENOL) 500 MG tablet Take 1,000 mg by mouth every 8 (eight) hours as needed for mild pain or headache.    [provider]  hydrOXYzine (ATARAX/VISTARIL) 25 MG tablet Take 25 mg by mouth at bedtime as needed for anxiety (sleep).  07/11/18   [provider]  sucralfate (CARAFATE) 1 GM/10ML suspension Take 10 mLs (1 g total) by mouth 4 (four) times daily -  with meals and at bedtime. 06/17/18 07/17/18  Arrien, Jimmy Picket, MD     Allergies:     Allergies  Allergen Reactions  . Tylox [Oxycodone-Acetaminophen] Nausea And Vomiting     Physical Exam:   Vitals  Blood pressure (!) 122/91, pulse (!) 121, temperature 97.7 F (36.5 C), temperature source Oral, resp. rate 18, height '5\' 11"'  (1.803 m), weight 102.1 kg, SpO2 100 %.  1.  General: Appears in mild distress  2. Psychiatric:  Intact judgement and  insight, awake alert, oriented x 3.  3. Neurologic: No focal neurological deficits, all cranial nerves intact.Strength 5/5 all 4 extremities, sensation intact all 4 extremities, plantars down going.  4. Eyes :  anicteric sclerae, moist conjunctivae with no lid lag. PERRLA.  5. ENMT:  Oropharynx clear with moist mucous membranes and good dentition  6. Neck:  supple, no cervical lymphadenopathy appriciated, No thyromegaly  7. Respiratory : Normal respiratory effort, good air movement bilaterally,clear to  auscultation bilaterally  8. Cardiovascular : RRR, no gallops,  no leg edema, grade 3/6 systolic murmur at pulmonic area  9. Gastrointestinal:  Positive bowel sounds, abdomen soft, non-tender to palpation,no hepatosplenomegaly, no rigidity or guarding       10. Skin:  No cyanosis, normal texture and turgor, no rash, lesions or  ulcers  11.Musculoskeletal:  Good muscle tone,  joints appear normal , no effusions,  normal range of motion    Data Review:    CBC Recent Labs  Lab 07/23/18 2012  WBC 13.5*  HGB 17.2*  HCT 47.7  PLT 239  MCV 85.5  MCH 30.8  MCHC 36.1*  RDW 11.8   ------------------------------------------------------------------------------------------------------------------  Results for orders placed or performed during the hospital encounter of 07/23/18 (from the past 48 hour(s))  Lipase, blood     Status: None   Collection Time: 07/23/18  8:12 PM  Result Value Ref Range   Lipase 30 11 - 51 U/L    Comment: Performed at [facility], [place] [address], [place], [place] [address]  Comprehensive metabolic panel     Status: Abnormal   Collection Time: 07/23/18  8:12 PM  Result Value Ref Range   Sodium 138 135 - 145 mmol/L   Potassium 3.8 3.5 - 5.1 mmol/L   Chloride 100 98 - 111 mmol/L   CO2 21 (L) 22 - 32 mmol/L   Glucose, Bld 327 (H) 70 - 99 mg/dL   BUN 8 6 - 20 mg/dL   Creatinine, Ser 0.66 0.61 - 1.24 mg/dL   Calcium 9.9 8.9 - 10.3 mg/dL   Total Protein 8.1 6.5 - 8.1 g/dL   Albumin 4.6 3.5 - 5.0 g/dL   AST 21 15 - 41 U/L   ALT 17 0 - 44 U/L   Alkaline Phosphatase 76 38 - 126 U/L   Total Bilirubin 1.2 0.3 - 1.2 mg/dL   GFR calc non Af Amer >60 >60 mL/min   GFR calc Af Amer >60 >60 mL/min    Comment: (NOTE) The eGFR has been calculated using the CKD EPI equation. This calculation has not been validated in all clinical situations. eGFR's persistently <60 mL/min signify possible Chronic Kidney Disease.    Anion gap 17 (H) 5 - 15    Comment: Performed at [facility], [place] [address], [place], [place] [address]  CBC     Status: Abnormal   Collection Time: 07/23/18  8:12 PM  Result Value Ref Range   WBC 13.5 (H) 4.0 - 10.5 K/uL   RBC 5.58 4.22 - 5.81 MIL/uL   Hemoglobin 17.2 (H) 13.0 - 17.0 g/dL   HCT 47.7 39.0 - 52.0 %   MCV 85.5 78.0 -  100.0 fL   MCH 30.8 26.0 - 34.0 pg   MCHC 36.1 (H) 30.0 - 36.0 g/dL   RDW 11.8 11.5 - 15.5 %   Platelets 239 150 - 400 K/uL    Comment: Performed at [facility], [place] [address], [place], [place] [address]  Urinalysis, Routine w reflex microscopic     Status: Abnormal   Collection Time: 07/23/18  8:24 PM  Result Value Ref Range   Color, Urine YELLOW YELLOW   APPearance CLEAR CLEAR   Specific Gravity, Urine 1.020 1.005 - 1.030   pH 7.0 5.0 - 8.0   Glucose, UA >=500 (A) NEGATIVE mg/dL   Hgb urine dipstick NEGATIVE NEGATIVE   Bilirubin Urine NEGATIVE NEGATIVE   Ketones, ur 80 (A) NEGATIVE mg/dL   Protein, ur NEGATIVE NEGATIVE mg/dL   Nitrite NEGATIVE NEGATIVE   Leukocytes, UA NEGATIVE NEGATIVE    Comment: Performed at [facility], [place] [address], [place], [place] [address]  Urinalysis, Microscopic (reflex)     Status: None   Collection Time: 07/23/18  8:24 PM  Result Value Ref Range   RBC / HPF 0-5 0 - 5 RBC/hpf   WBC, UA 0-5 0 - 5 WBC/hpf   Bacteria, UA NONE SEEN NONE SEEN   Squamous Epithelial / LPF NONE SEEN 0 - 5   Mucus PRESENT     Comment: Performed at [facility], [place] [address], [place],  [address]  Blood gas, venous     Status: Abnormal   Collection Time: 07/23/18 11:05 PM  Result  Value Ref Range   pH, Ven 7.431 (H) 7.250 - 7.430   pCO2, Ven 32.4 (L) 44.0 - 60.0 mmHg   pO2, Ven 76.5 (H) 32.0 - 45.0 mmHg   Bicarbonate 21.2 20.0 - 28.0 mmol/L   Acid-base deficit 1.6 0.0 - 2.0 mmol/L   O2 Saturation 95.1 %   Patient temperature 98.6    Collection site VEIN    Drawn by DRAWN BY RN    Sample type VEIN     Comment: Performed at [facility], [place] [address] [place], [place] [address]  CBG monitoring, ED     Status: Abnormal   Collection Time: 07/23/18 11:55 PM  Result Value Ref Range   Glucose-Capillary 273 (H) 70 - 99 mg/dL   Comment 1 Notify RN     Chemistries  Recent Labs  Lab  07/23/18 2012  NA 138  K 3.8  CL 100  CO2 21*  GLUCOSE 327*  BUN 8  CREATININE 0.66  CALCIUM 9.9  AST 21  ALT 17  ALKPHOS 76  BILITOT 1.2   ------------------------------------------------------------------------------------------------------------------  ------------------------------------------------------------------------------------------------------------------ GFR: Estimated Creatinine Clearance: 137.4 mL/min (by C-G formula based on SCr of 0.66 mg/dL). Liver Function Tests: Recent Labs  Lab 07/23/18 2012  AST 21  ALT 17  ALKPHOS 76  BILITOT 1.2  PROT 8.1  ALBUMIN 4.6   Recent Labs  Lab 07/23/18 2012  LIPASE 30   No results for input(s): AMMONIA in the last 168 hours. Coagulation Profile: No results for input(s): INR, PROTIME in the last 168 hours. Cardiac Enzymes: No results for input(s): CKTOTAL, CKMB, CKMBINDEX, TROPONINI in the last 168 hours. BNP (last 3 results) No results for input(s): PROBNP in the last 8760 hours. HbA1C: No results for input(s): HGBA1C in the last 72 hours. CBG: Recent Labs  Lab 07/23/18 2355  GLUCAP 273*   Lipid Profile: No results for input(s): CHOL, HDL, LDLCALC, TRIG, CHOLHDL, LDLDIRECT in the last 72 hours. Thyroid Function Tests: No results for input(s): TSH, T4TOTAL, FREET4, T3FREE, THYROIDAB in the last 72 hours. Anemia Panel: No results for input(s): VITAMINB12, FOLATE, FERRITIN, TIBC, IRON, RETICCTPCT in the last 72 hours.  --------------------------------------------------------------------------------------------------------------- Urine analysis:    Component Value Date/Time   COLORURINE YELLOW 07/23/2018 2024   APPEARANCEUR CLEAR 07/23/2018 2024   LABSPEC 1.020 07/23/2018 2024   PHURINE 7.0 07/23/2018 2024   GLUCOSEU >=500 (A) 07/23/2018 2024   HGBUR NEGATIVE 07/23/2018 2024   BILIRUBINUR NEGATIVE 07/23/2018 2024   KETONESUR 80 (A) 07/23/2018 2024   PROTEINUR NEGATIVE 07/23/2018 2024   UROBILINOGEN  1.0 09/06/2008 0900   NITRITE NEGATIVE 07/23/2018 2024   LEUKOCYTESUR NEGATIVE 07/23/2018 2024      Imaging Results:    No results found.  My personal review of EKG: Rhythm NSR, QTc 475   Assessment & Plan:    Active Problems:   Diabetes mellitus, insulin dependent (IDDM), uncontrolled ([place])   DKA (diabetic ketoacidoses) ([facility])   Type 2 diabetes mellitus with peripheral neuropathy ([place])   1. Intractable nausea vomiting/hematemesis-patient has erosive/ulcerative esophagitis seen on the EGD from 06/15/2018, GI was consulted by ED physician and they recommended supportive care for now.  Patient was unable to tolerate antiemetics by mouth at home.  Will start Phenergan 12.5 IV every 6 hours as needed, Protonix 40 mg IV every 12 hours.  If patient does not improve by these measures consider GI consult in a.m.  2. DKA, mild-patient has mild DKA, with anion gap of 17, bicarb 21.  Will  start DKA protocol.  Will give IV potassium as per DKA protocol.   3. Prolonged QTc-patient has prolonged QTC with QTc interval of 475, will continue cardiac monitoring in stepdown.  Will give 1 dose of mag sulfate 2 g IV.  Check serum magnesium level.  4. Hypertension-blood pressure is elevated, patient is unable to take p.o. medications.  Will start IV hydralazine 10 mg every 6 hours PRN, also low-dose Lopressor 2.5 mg IV every 8 hours.  5. Heart murmur- auscultated at the pulmonic area, no previous echo is available. Will obtain echo in am.   DVT Prophylaxis-   SCDs   AM Labs Ordered, also please review Full Orders  Family Communication: Admission, patients condition and plan of care including tests being ordered have been discussed with the patient  who indicate understanding and agree with the plan and Code Status.  Code Status: Full code  Admission status: Inpatient  Time spent in minutes : 60 minutes   Oswald Hillock M.D on 07/24/2018 at 12:12 AM  Between 7am to 7pm - Pager - [phone]. After  7pm go to [email or web address] - password [facility]  [facility] - Office  [phone]

## 2018-07-24 NOTE — Progress Notes (Addendum)
Referring Provider: [facility] Primary Care Physician:  [name], MD Primary Gastroenterologist:   Zenovia Jarred, MD  Reason for Consultation:   hematemesis    ASSESSMENT AND PLAN:    47 yo male admitted with coffee ground emesis. Hemodynamically stable, hgb actually high on admit but likely volume depletion. He has known SEVERE ulcerative esophagitis seen on EGD 06/15/18 and was off PPI for a couple of weeks ( out of meds). He has DM with peripheral neuropathy, probably has some degree of diabetic gastroparesis as well raising his risk of reflux.   -I don't know what repeat EGD this soon will add value / change management. He is scheduled to have follow up EGD in late October.  -The main thing is getting his N/V under control.  -Agree with BID IV PPI. -He needs scheduled anti-emetics but prolonged QT interval increases risk of arrhythmias -IV Reglan 10 mg ac and HS -Can resume Carafate if tolerates it.  -If above not helpful then consider trial of IV erythromycin -Anti-reflux precautions with HOB elevated.  -One other thing to consider is whether he is absorbing enough of the Paxil with the vomiting. If has a very short half life and missed (or unabsorbed dose) can result in N/V   HPI: Grant [name] is a 47 y.o. male with DM with admissions for DKA, peripheral neuropathy, HTN, hx of Etoh abuse. He was hospitalized late July for Etoh withdrawal / N/V/ hematemesis.  Inpatient EGD 06/15/18 revealed severe LA grade D erosive esophagitis, normal stomach. No fungal organisms on biopsies. He was treated with carafate, high dose PPI and Reglan and scheduled for repeat EGD in 12 weeks. Patient presented to office in the interim, on 07/17/18 for N/V and reflux type symptoms, he had been in the ED the day prior. He had been off PPI for a couple of weeks due to insufficient supply of medication prescribed at discharge but was still taking the carafate. We restarted BID PPI, continued BID PPI  and resumed Reglan for an additional 2 weeks. He was scheduled for repeat EGD in late October.   Yesterday patient's wife called EMS as patient wasn't tolerating any PO. He was brought to ED and admitted for N/V and coffee ground emesis. He describes ~ 5 episodes of coffee ground emesis, no blood in stools / black stools. He has epigastric discomfort at times. Based on weights in EMR, his weight is overall stable, it has fluctuated ~ 10 pounds over last few months.   ED evaluation:  BP 217/113, 100, afebrile Glucose 327 Anion gap 17 C02 21 Lipase and liver chemistries unremarkable.  WBC 13.5 Hgb 17.2   Past Medical History:  Diagnosis Date  . Chronic low back pain 11/30/2015  . Diabetic peripheral neuropathy ([place]) 11/30/2015  . Hypertension   . Neuropathy   . Type 2 diabetes mellitus ([place])     Past Surgical History:  Procedure Laterality Date  . back lipoma resection  03/2006  . BIOPSY  06/15/2018   Procedure: BIOPSY;  Surgeon: Jerene Bears, MD;  Location: [facility] ENDOSCOPY;  Service: Gastroenterology;;  . ESOPHAGOGASTRODUODENOSCOPY (EGD) WITH PROPOFOL N/A 06/15/2018   Procedure: ESOPHAGOGASTRODUODENOSCOPY (EGD) WITH PROPOFOL;  Surgeon: Jerene Bears, MD;  Location: [place];  Service: Gastroenterology;  Laterality: N/A;  . KNEE ARTHROSCOPY  10/2008   left  . LEFT HEART CATHETERIZATION WITH CORONARY ANGIOGRAM N/A 08/11/2013   Procedure: LEFT HEART CATHETERIZATION WITH CORONARY ANGIOGRAM;  Surgeon: Pixie Casino, MD;  Location: [facility] CATH  LAB;  Service: Cardiovascular;  Laterality: N/A;  . UPPER GI ENDOSCOPY  10/2007   showed gastritis    Prior to Admission medications   Medication Sig Start Date End Date Taking? Authorizing Provider  amLODipine (NORVASC) 5 MG tablet Take 5 mg by mouth daily. 06/07/18  Yes [provider]  insulin NPH Human (HUMULIN N,NOVOLIN N) 100 UNIT/ML injection Inject 30-40 Units into the skin See admin instructions. Use 30 units every morning then use  40 units every evening   Yes [provider]  insulin regular (NOVOLIN R,HUMULIN R) 100 units/mL injection Inject 4-8 Units into the skin 3 (three) times daily before meals. Sliding scale   Yes [provider]  metoCLOPramide (REGLAN) 10 MG tablet Take 1 tablet (10 mg total) by mouth 4 (four) times daily -  before meals and at bedtime. For 2 weeks 07/17/18  Yes Zehr, Janett Billow D, PA-C  metoprolol succinate (TOPROL-XL) 25 MG 24 hr tablet Take 25 mg by mouth daily. 06/07/18  Yes [provider]  pantoprazole (PROTONIX) 40 MG tablet Take 1 tablet (40 mg total) by mouth 2 (two) times daily before a meal. 07/17/18  Yes Zehr, Janett Billow D, PA-C  PARoxetine (PAXIL) 10 MG tablet Take 10 mg by mouth 2 (two) times daily. 05/08/18  Yes [provider]  acetaminophen (TYLENOL) 500 MG tablet Take 1,000 mg by mouth every 8 (eight) hours as needed for mild pain or headache.    [provider]  hydrOXYzine (ATARAX/VISTARIL) 25 MG tablet Take 25 mg by mouth at bedtime as needed for anxiety (sleep).  07/11/18   [provider]  sucralfate (CARAFATE) 1 GM/10ML suspension Take 10 mLs (1 g total) by mouth 4 (four) times daily -  with meals and at bedtime. 06/17/18 07/17/18  Arrien, Jimmy Picket, MD    Current Facility-Administered Medications  Medication Dose Route Frequency Provider Last Rate Last Dose  . dextrose 5 %-0.45 % sodium chloride infusion   Intravenous Continuous Schorr, Rhetta Mura, NP 75 mL/hr at 07/24/18 0726    . hydrALAZINE (APRESOLINE) injection 10 mg  10 mg Intravenous Q6H PRN Oswald Hillock, MD      . insulin regular (NOVOLIN R,HUMULIN R) 100 Units in sodium chloride 0.9 % 100 mL (1 Units/mL) infusion   Intravenous Continuous Oswald Hillock, MD 6.4 mL/hr at 07/24/18 0726    . LORazepam (ATIVAN) injection 0.5 mg  0.5 mg Intravenous Q6H PRN Schorr, Rhetta Mura, NP   0.5 mg at 07/24/18 0842  . metoprolol tartrate (LOPRESSOR) injection 2.5 mg  2.5 mg Intravenous  Q8H Oswald Hillock, MD   2.5 mg at 07/24/18 0552  . [START ON 07/27/2018] pantoprazole (PROTONIX) injection 40 mg  40 mg Intravenous Q12H Darrick Meigs, Marge Duncans, MD      . prochlorperazine (COMPAZINE) injection 10 mg  10 mg Intravenous Q6H PRN Sheikh, Omair Latif, DO      . sodium chloride 0.9 % bolus 1,000 mL  1,000 mL Intravenous Once Raiford Noble Castle, DO 983.6 mL/hr at 07/24/18 0844 1,000 mL at 07/24/18 0844    Allergies as of 07/23/2018 - Review Complete 07/23/2018  Allergen Reaction Noted  . Tylox [oxycodone-acetaminophen] Nausea And Vomiting 08/05/2013    Family History  Problem Relation Age of Onset  . Diabetes Mother   . Hypertension Mother   . Hypertension Father   . Hyperlipidemia Father   . Alcohol abuse Brother   . Brain cancer Unknown        grandparent  .  Stomach cancer Unknown        grandparent    Social History   Socioeconomic History  . Marital status: Married    Spouse name: Not on file  . Number of children: 2  . Years of education: college  . Highest education level: Not on file  Occupational History  . Occupation: disability  Social Needs  . Financial resource strain: Not on file  . Food insecurity:    Worry: Not on file    Inability: Not on file  . Transportation needs:    Medical: Not on file    Non-medical: Not on file  Tobacco Use  . Smoking status: Current Every Day Smoker    Packs/day: 0.50    Years: 20.00    Pack years: 10.00  . Smokeless tobacco: Current User    Types: Chew  . Tobacco comment: 5-7 cigarettes/day; chews occasionally  Substance and Sexual Activity  . Alcohol use: No  . Drug use: No  . Sexual activity: Not on file  Lifestyle  . Physical activity:    Days per week: Not on file    Minutes per session: Not on file  . Stress: Not on file  Relationships  . Social connections:    Talks on phone: Not on file    Gets together: Not on file    Attends religious service: Not on file    Active member of club or organization: Not  on file    Attends meetings of clubs or organizations: Not on file    Relationship status: Not on file  . Intimate partner violence:    Fear of current or ex partner: Not on file    Emotionally abused: Not on file    Physically abused: Not on file    Forced sexual activity: Not on file  Other Topics Concern  . Not on file  Social History Narrative   Patient drinks 1 cup of caffeine daily.   Patient is right handed.     Review of Systems: All systems reviewed and negative except where noted in HPI.  Physical Exam: Vital signs in last 24 hours: Temp:  [97.7 F (36.5 C)-98.5 F (36.9 C)] 97.8 F (36.6 C) (08/29 0800) Pulse Rate:  [100-121] 111 (08/29 0200) Resp:  [18-20] 19 (08/29 0200) BP: (96-217)/(75-113) 151/106 (08/29 0200) SpO2:  [96 %-100 %] 99 % (08/29 0200) Weight:  [102.1 kg-102.5 kg] 102.5 kg (08/29 0200) Last BM Date: 07/23/18 General:   Alert, well-developed, white male in NAD Psych:  cooperative. Normal mood and affect. Eyes:  Pupils equal, sclera clear, no icterus.   Conjunctiva pink. Ears:  Normal auditory acuity. Nose:  No deformity, discharge,  or lesions. Neck:  Supple; no masses Lungs:  Clear throughout to auscultation.   No wheezes, crackles, or rhonchi.  Heart:  Regular rate and rhythm; no murmurs, no edema Abdomen:  Soft, non-distended, nontender, BS active, no palp mass    Rectal:  Deferred  Msk:  Symmetrical without gross deformities. . Neurologic:  Alert and  oriented x4;  grossly normal neurologically. Skin:  Intact without significant lesions or rashes..   Intake/Output from previous day: 08/28 0701 - 08/29 0700 In: 984 [IV Piggyback:984] Out: 850 [Urine:800; Emesis/NG output:50] Intake/Output this shift: Total I/O In: 621.4 [I.V.:422.5; IV Piggyback:198.9] Out: -   Lab Results: Recent Labs    07/23/18 2012 07/24/18 0822  WBC 13.5* 15.1*  HGB 17.2* 16.9  HCT 47.7 47.9  PLT 239 255   BMET Recent Labs  07/24/18 0158  07/24/18 0616 07/24/18 0822  NA 139 139 141  K 3.8 3.6 3.8  CL 104 106 106  CO2 24 23 24   GLUCOSE 278* 171* 143*  BUN 7 6 6   CREATININE 0.60* 0.60* 0.59*  CALCIUM 9.2 9.3 9.3   LFT Recent Labs    07/24/18 0822  PROT 7.6  ALBUMIN 4.4  AST 29  ALT 15  ALKPHOS 71  BILITOT 1.4*    Tye Savoy, NP-C @  07/24/2018, 9:14 AM    Attending physician's note   I have taken an interval history, reviewed the chart and examined the patient. I agree with the Advanced Practitioner's note, impression, and recommendations as outlined.  Recent admission last month with similar presentation.  EGD at that time notable for 10 cm segment of LA grade D esophagitis and a clean ulcer at the GE junction with biopsies negative for H. pylori.  Agree with plan for antiemetics and high-dose acid suppression therapy along with Carafate.  Discussed the difficulty in using promotility agents given his prolonged QTC at 474.  Agree that he probably has some component of gastroparesis which can lead to both nausea vomiting as well as exacerbate acid reflux/erosive esophagitis.  Given his otherwise hemodynamic stability and stable serial hemoglobin, agree that repeat endoscopic evaluation unlikely to change overall treatment plan.  If no change in nausea vomiting with above antiemetic plan, will discuss with primary service regarding addition of short course of promotility agents (i.e. IV erythromycin x3 days) while on telemetry.  Okay with trialing clears for now.   We will continue to follow.  [address], DO, FACG [phone] office

## 2018-07-24 NOTE — ED Notes (Signed)
ED TO INPATIENT HANDOFF REPORT  Name/Age/Gender Grant [name] 47 y.o. male  Code Status Code Status History    Date Active Date Inactive Code Status Order ID Comments User Context   06/09/2018 0527 06/17/2018 1517 Full Code [id number]  Ivor Costa, MD ED   09/17/2016 1716 09/18/2016 1733 Full Code [id number]  Radene Gunning, NP ED   09/17/2016 1622 09/17/2016 1716 Full Code [id number]  Black, Lezlie Octave, NP ED      Home/SNF/Other Home  Chief Complaint Abdominal Pain; N/V  Level of Care/Admitting Diagnosis ED Disposition    ED Disposition Condition [place]: [place] [[id number]]  Level of Care: Stepdown [14]  Admit to SDU based on following criteria: Severe physiological/psychological symptoms:  Any diagnosis requiring assessment & intervention at least every 4 hours on an ongoing basis to obtain desired patient outcomes including stability and rehabilitation  Diagnosis: DKA (diabetic ketoacidoses) [facility]) [[id number]]  Admitting Physician: Bridger, Coleman  Attending Physician: Oswald Hillock [[id number]]  Estimated length of stay: 3 - 4 days  Certification:: I certify this patient will need inpatient services for at least 2 midnights  PT Class (Do Not Modify): Inpatient [101]  PT Acc Code (Do Not Modify): Private [1]       Medical History Past Medical History:  Diagnosis Date  . Chronic low back pain 11/30/2015  . Diabetic peripheral neuropathy ([place]) 11/30/2015  . Hypertension   . Neuropathy   . Type 2 diabetes mellitus ([facility])     Allergies Allergies  Allergen Reactions  . Tylox [Oxycodone-Acetaminophen] Nausea And Vomiting    IV Location/Drains/Wounds Patient Lines/Drains/Airways Status   Active Line/Drains/Airways    Name:   Placement date:   Placement time:   Site:   Days:   Peripheral IV 07/23/18 Right Hand   07/23/18    2014    Hand   1   Peripheral IV 07/24/18 Left;Lateral Forearm   07/24/18    0050    Forearm   less than 1           Labs/Imaging Results for orders placed or performed during the hospital encounter of 07/23/18 (from the past 48 hour(s))  Lipase, blood     Status: None   Collection Time: 07/23/18  8:12 PM  Result Value Ref Range   Lipase 30 11 - 51 U/L    Comment: Performed at [facility], [place] [address], [place], [place] [address]  Comprehensive metabolic panel     Status: Abnormal   Collection Time: 07/23/18  8:12 PM  Result Value Ref Range   Sodium 138 135 - 145 mmol/L   Potassium 3.8 3.5 - 5.1 mmol/L   Chloride 100 98 - 111 mmol/L   CO2 21 (L) 22 - 32 mmol/L   Glucose, Bld 327 (H) 70 - 99 mg/dL   BUN 8 6 - 20 mg/dL   Creatinine, Ser 0.66 0.61 - 1.24 mg/dL   Calcium 9.9 8.9 - 10.3 mg/dL   Total Protein 8.1 6.5 - 8.1 g/dL   Albumin 4.6 3.5 - 5.0 g/dL   AST 21 15 - 41 U/L   ALT 17 0 - 44 U/L   Alkaline Phosphatase 76 38 - 126 U/L   Total Bilirubin 1.2 0.3 - 1.2 mg/dL   GFR calc non Af Amer >60 >60 mL/min   GFR calc Af Amer >60 >60 mL/min    Comment: (NOTE) The eGFR has been calculated using the  CKD EPI equation. This calculation has not been validated in all clinical situations. eGFR's persistently <60 mL/min signify possible Chronic Kidney Disease.    Anion gap 17 (H) 5 - 15    Comment: Performed at [facility], [place] [address] [place], [place] [address]  CBC     Status: Abnormal   Collection Time: 07/23/18  8:12 PM  Result Value Ref Range   WBC 13.5 (H) 4.0 - 10.5 K/uL   RBC 5.58 4.22 - 5.81 MIL/uL   Hemoglobin 17.2 (H) 13.0 - 17.0 g/dL   HCT 47.7 39.0 - 52.0 %   MCV 85.5 78.0 - 100.0 fL   MCH 30.8 26.0 - 34.0 pg   MCHC 36.1 (H) 30.0 - 36.0 g/dL   RDW 11.8 11.5 - 15.5 %   Platelets 239 150 - 400 K/uL    Comment: Performed at [facility], [place] [address], [place], [place] [address]  Urinalysis, Routine w reflex microscopic     Status: Abnormal   Collection Time: 07/23/18  8:24 PM  Result Value Ref Range   Color,  Urine YELLOW YELLOW   APPearance CLEAR CLEAR   Specific Gravity, Urine 1.020 1.005 - 1.030   pH 7.0 5.0 - 8.0   Glucose, UA >=500 (A) NEGATIVE mg/dL   Hgb urine dipstick NEGATIVE NEGATIVE   Bilirubin Urine NEGATIVE NEGATIVE   Ketones, ur 80 (A) NEGATIVE mg/dL   Protein, ur NEGATIVE NEGATIVE mg/dL   Nitrite NEGATIVE NEGATIVE   Leukocytes, UA NEGATIVE NEGATIVE    Comment: Performed at [facility], [place] [address] [place], [place] [address]  Urinalysis, Microscopic (reflex)     Status: None   Collection Time: 07/23/18  8:24 PM  Result Value Ref Range   RBC / HPF 0-5 0 - 5 RBC/hpf   WBC, UA 0-5 0 - 5 WBC/hpf   Bacteria, UA NONE SEEN NONE SEEN   Squamous Epithelial / LPF NONE SEEN 0 - 5   Mucus PRESENT     Comment: Performed at [facility], [place] [address] [place], [place] [address]  Blood gas, venous     Status: Abnormal   Collection Time: 07/23/18 11:05 PM  Result Value Ref Range   pH, Ven 7.431 (H) 7.250 - 7.430   pCO2, Ven 32.4 (L) 44.0 - 60.0 mmHg   pO2, Ven 76.5 (H) 32.0 - 45.0 mmHg   Bicarbonate 21.2 20.0 - 28.0 mmol/L   Acid-base deficit 1.6 0.0 - 2.0 mmol/L   O2 Saturation 95.1 %   Patient temperature 98.6    Collection site VEIN    Drawn by DRAWN BY RN    Sample type VEIN     Comment: Performed at [facility], [place] [address] [place], [place] [address]  CBG monitoring, ED     Status: Abnormal   Collection Time: 07/23/18 11:55 PM  Result Value Ref Range   Glucose-Capillary 273 (H) 70 - 99 mg/dL   Comment 1 Notify RN    No results found.  Pending Labs [employer] (From admission, onward)    Start     Ordered   Signed and Corporate treasurer  STAT Now then every 4 hours ,   STAT     Signed and Held   Signed and Held  Magnesium  Once,   R     Signed and Held          Vitals/Pain Today's Vitals   07/23/18  2330 07/23/18 2356 07/24/18 0000 07/24/18 0015  BP: (!) 122/91  (!) 122/91 96/75   Pulse: (!) 109  (!) 121 (!) 112  Resp:   18   Temp:   97.7 F (36.5 C)   TempSrc:   Oral   SpO2: 97%  100% 96%  Weight:  225 lb (102.1 kg)    Height:  _0  (1.803 m)      Isolation Precautions No active isolations  Medications Medications  insulin regular (NOVOLIN R,HUMULIN R) 100 Units in sodium chloride 0.9 % 100 mL (1 Units/mL) infusion (2.2 Units/hr Intravenous New Bag/Given 07/24/18 0005)  dextrose 5 %-0.45 % sodium chloride infusion (has no administration in time range)  magnesium sulfate IVPB 2 g 50 mL (2 g Intravenous New Bag/Given 07/24/18 0057)  ondansetron (ZOFRAN-ODT) disintegrating tablet 4 mg (4 mg Oral Given 07/23/18 1859)  sodium chloride 0.9 % bolus 1,000 mL (0 mLs Intravenous Stopped 07/23/18 2204)  prochlorperazine (COMPAZINE) injection 10 mg (10 mg Intravenous Given 07/23/18 2036)  diphenhydrAMINE (BENADRYL) injection 25 mg (25 mg Intravenous Given 07/23/18 2036)  capsaicin (ZOSTRIX) 0.025 % cream ( Topical Given 07/23/18 2222)  diphenhydrAMINE (BENADRYL) injection 25 mg (25 mg Intravenous Given 07/23/18 2219)  prochlorperazine (COMPAZINE) injection 10 mg (10 mg Intravenous Given 07/23/18 2219)  metoCLOPramide (REGLAN) injection 10 mg (10 mg Intravenous Given 07/23/18 2356)    Mobility walks

## 2018-07-24 NOTE — Progress Notes (Signed)
PROGRESS NOTE    Grant [name]  ZOX:[id number] DOB: March 13, 1971 DOA: 07/23/2018 PCP: [name], MD   Brief Narrative:  The patient is a 47 y.o. male, with history of Hypertension, Dyslipidemia, Type 2 diabetes mellitus, Depression, Peripheral Neuropathy, chronic back pain who was recently discharged from hospital on 06/17/2018 after he was diagnosed with acute ulcerative and erosive esophagitis seen on the EGD on 06/15/2018.  Patient was discharged on Protonix twice daily, Reglan and Carafate.  Patient was seen by GI last week in the clinic for ongoing nausea vomiting.  Patient says that intermittently improved but started throwing up this morning.  He complains of more nausea.  Also having coffee-ground emesis. In the ED, lab work revealed mild DKA with anion gap of 17, bicarb 21. Patient started on DKA protocol and admitted for Intractable Nausea and Vomiting.  Assessment & Plan:   Active Problems:   Diabetes mellitus, insulin dependent (IDDM), uncontrolled (HCC)   Essential hypertension   Peripheral neuropathy   Chronic low back pain   Abdominal pain   DKA (diabetic ketoacidoses) (HCC)   Nausea and vomiting   IDDM (insulin dependent diabetes mellitus) ([place])   Gastroparesis   Hematemesis   Type 2 diabetes mellitus with peripheral neuropathy (HCC)   Esophagitis, [place] grade D   Long QT interval   Gastroesophageal reflux disease  Intractable nausea vomiting/hematemesis likely in the setting of Diabetic Gastroparesis -Patient has erosive/ulcerative esophagitis seen on the EGD from 06/15/2018, -GI was consulted by ED physician and they recommended supportive care for now.   -Patient was unable to tolerate antiemetics by mouth at home and was made NPO -Started Phenergan 12.5 IV every 6 hours as needed, but will Discontinue and start IV Compazine given prolonged QTc -C/w Protonix 40 mg IV every 12 hours. -GI Consulted for further evaluation and recommending continuing  high-dose acid suppression therapy along with adding Carafate -GI also feels there is little utility in repeating EGD this soon -Per GI ok to Try Clear Liquids and if no change in Nausea with Antiemetics would likely try a short course of promotility agents including IV Erythromycin x3 days -IV Reglan changed to 10 mg AC and HS by GI -Elevate HOB -C/w Compazine 10 mg q6hprn N/V -C/w Lorazepam 0.6 mg IV q6hprn Nausea -Appreciate GI Evaluation and Further Recommendations; Advance Diet as tolerated and transition of Insulin gtt when able to tolerate po   DKA mild, improving -Patient has mild DKA, with anion gap of 17, bicarb 21. -Started on DKA protocol and now on D5W 1/2 NS at 75 mL/hr.   -C/w Insulin gtt -Was given IV potassium as per DKA protocol.  -Recent CMP showed CO2 of 24 and AG of 11 -CBG's ranging from 159-162 -Transition to Long Acting when patient is able to tolerate po; GI stated Trial of Clear Liquid Diet  Prolonged QTc -Ppatient has prolonged QTC with QTc interval of 475, will continue cardiac monitoring in stepdown.   -Was given IV Mag Sulfate -C/w Telemetry and Serial EKGs  Hypertension -Blood pressure is elevated, patient is unable to take p.o. medications.   -C/w IV Hydralazine 10 mg every 6 hours PRN, also low-dose Metoprolol 2.5 mg IV every 8 hours.  Heart Murmur -Auscultated at the pulmonic area, no previous echo is available.  -ECHOCardiogram done and showed Mitral Trivial Regurgitation  Hypophosphatemia -Patient's Phos was 2.0 -Replete with IV K Phos 20 mmol -Continue to Monitor and Replete as Necessary -Repeat Phos Level in AM   Leukocytosis -  Likely 2/2 to Nausea and Vomiting -WBC was 15.1 -Continue to Monitor for S/Sx of Bleeding -Repeat CBC in AM   DVT prophylaxis: SCDs Code Status: FULL CODE Family Communication: No family present at bedside Disposition Plan: Anticipate D/C in the next 48 hours if improving   Consultants:    Gastroenterology Dr. Gerrit Heck   Procedures:  ECHOCARDIOGRAM ------------------------------------------------------------------- Study Conclusions  - Left ventricle: The cavity size was normal. There was moderate   concentric hypertrophy. Systolic function was vigorous. The   estimated ejection fraction was in the range of 65% to 70%. Wall   motion was normal; there were no regional wall motion   abnormalities. The study is not technically sufficient to allow   evaluation of LV diastolic function. - Aortic valve: There was no regurgitation. - Aortic root: The aortic root was normal in size. - Mitral valve: There was trivial regurgitation. - Right ventricle: The cavity size was normal. Wall thickness was   normal. Systolic function was normal. - Right atrium: The atrium was normal in size. - Pulmonary arteries: The main pulmonary artery was normal-sized.   Systolic pressure was within the normal range. - Inferior vena cava: The vessel was normal in size. - Pericardium, extracardiac: A trivial pericardial effusion was   identified posterior to the heart. Features were not consistent   with tamponade physiology.   Antimicrobials:  Anti-infectives (From admission, onward)   None     Subjective: Patient was seen and examined at bedside and states that he is still nauseous and Has been throwing up.  Denies chest pain, lightheadedness or dizziness.  States he is unable to keep anything down p.o.  No other concerns are placed at this time and was wanting another antiemetic.  Objective: Vitals:   07/24/18 0200 07/24/18 0400 07/24/18 0800 07/24/18 1351  BP: (!) 151/106   (!) 148/113  Pulse: (!) 111     Resp: 19   19  Temp: 98.1 F (36.7 C) 98.5 F (36.9 C) 97.8 F (36.6 C)   TempSrc: Oral Oral Oral   SpO2: 99%     Weight: 102.5 kg     Height: 5\' 11"  (1.803 m)       Intake/Output Summary (Last 24 hours) at 07/24/2018 1447 Last data filed at 07/24/2018 1220 Gross  per 24 hour  Intake 2485.66 ml  Output 2250 ml  Net 235.66 ml   Filed Weights   07/23/18 2356 07/24/18 0200  Weight: 102.1 kg 102.5 kg   Examination: Physical Exam:  Constitutional: WN/WD obese Caucasian male in NAD and appears calm but uncomfortable Eyes: Lids and conjunctivae normal, sclerae anicteric  ENMT: External Ears, Nose appear normal. Grossly normal hearing. Mucous membranes are dry Neck: Appears normal, supple, no cervical masses, normal ROM, no appreciable thyromegaly, no JVD Respiratory: Diminished to auscultation bilaterally, no wheezing, rales, rhonchi or crackles. Normal respiratory effort and patient is not tachypenic. No accessory muscle use.  Cardiovascular: Tachycardic Rate but regular rhythm, no murmurs / rubs / gallops. S1 and S2 auscultated. Trace extremity edema.  Abdomen: Soft, tender, Distended 2/2 to Body habitus. No masses palpated. No appreciable hepatosplenomegaly. Bowel sounds positive x4.  GU: Deferred. Musculoskeletal: No clubbing / cyanosis of digits/nails.  Good ROM, no contractures. Normal strength and muscle tone.  Skin: No rashes, lesions, ulcers on a limited skin eval. No induration; Warm and dry.  Neurologic: CN 2-12 grossly intact with no focal deficits. Strength 5/5 in all 4. Romberg sign and cerebellar reflexes not assessed.  Psychiatric:  Normal judgment and insight. Alert and oriented x 3. Normal mood and appropriate affect.   Data Reviewed: I have personally reviewed following labs and imaging studies  CBC: Recent Labs  Lab 07/23/18 2012 07/24/18 0822  WBC 13.5* 15.1*  NEUTROABS  --  11.4*  HGB 17.2* 16.9  HCT 47.7 47.9  MCV 85.5 85.2  PLT 239 297   Basic Metabolic Panel: Recent Labs  Lab 07/23/18 2012 07/24/18 0158 07/24/18 0616 07/24/18 0822  NA 138 139 139 141  K 3.8 3.8 3.6 3.8  CL 100 104 106 106  CO2 21* 24 23 24   GLUCOSE 327* 278* 171* 143*  BUN 8 7 6 6   CREATININE 0.66 0.60* 0.60* 0.59*  CALCIUM 9.9 9.2 9.3 9.3   MG  --  2.3  --   --   PHOS  --   --   --  2.0*   GFR: Estimated Creatinine Clearance: 137.7 mL/min (A) (by C-G formula based on SCr of 0.59 mg/dL (L)). Liver Function Tests: Recent Labs  Lab 07/23/18 2012 07/24/18 0822  AST 21 29  ALT 17 15  ALKPHOS 76 71  BILITOT 1.2 1.4*  PROT 8.1 7.6  ALBUMIN 4.6 4.4   Recent Labs  Lab 07/23/18 2012  LIPASE 30   No results for input(s): AMMONIA in the last 168 hours. Coagulation Profile: No results for input(s): INR, PROTIME in the last 168 hours. Cardiac Enzymes: No results for input(s): CKTOTAL, CKMB, CKMBINDEX, TROPONINI in the last 168 hours. BNP (last 3 results) No results for input(s): PROBNP in the last 8760 hours. HbA1C: No results for input(s): HGBA1C in the last 72 hours. CBG: Recent Labs  Lab 07/24/18 0919 07/24/18 1030 07/24/18 1140 07/24/18 1245 07/24/18 1339  GLUCAP 122* 192* 159* 196* 162*   Lipid Profile: No results for input(s): CHOL, HDL, LDLCALC, TRIG, CHOLHDL, LDLDIRECT in the last 72 hours. Thyroid Function Tests: No results for input(s): TSH, T4TOTAL, FREET4, T3FREE, THYROIDAB in the last 72 hours. Anemia Panel: No results for input(s): VITAMINB12, FOLATE, FERRITIN, TIBC, IRON, RETICCTPCT in the last 72 hours. Sepsis Labs: No results for input(s): PROCALCITON, LATICACIDVEN in the last 168 hours.  Recent Results (from the past 240 hour(s))  MRSA PCR Screening     Status: None   Collection Time: 07/24/18  1:33 AM  Result Value Ref Range Status   MRSA by PCR NEGATIVE NEGATIVE Final    Comment:        The GeneXpert MRSA Assay (FDA approved for NASAL specimens only), is one component of a comprehensive MRSA colonization surveillance program. It is not intended to diagnose MRSA infection nor to guide or monitor treatment for MRSA infections. Performed at [facility], [place] [address] [place], [place] [address]     Radiology Studies: No results found.   Scheduled Meds: .  metoCLOPramide (REGLAN) injection  10 mg Intravenous TID AC & HS  . metoprolol tartrate  2.5 mg Intravenous Q8H  . [START ON 07/27/2018] pantoprazole  40 mg Intravenous Q12H  . scopolamine  1 patch Transdermal Q72H  . sucralfate  1 g Oral Q6H   Continuous Infusions: . dextrose 5 % and 0.45% NaCl 75 mL/hr at 07/24/18 1220  . insulin (NOVOLIN-R) infusion 5.9 Units/hr (07/24/18 1353)  . potassium PHOSPHATE IVPB (in mmol)      LOS: 0 days   Kerney Elbe, DO [facility] PAGER is on [place]  If 7PM-7AM, please contact night-coverage [email or web address] Password [facility] 07/24/2018, 2:47 PM

## 2018-07-24 NOTE — Telephone Encounter (Signed)
He was admitted. We will see him. Thanks

## 2018-07-24 NOTE — Progress Notes (Signed)
Pt. Refusing cardiac monitoring. MD paged.

## 2018-07-24 NOTE — Progress Notes (Signed)
  Echocardiogram 2D Echocardiogram has been performed.  Grant [name] 07/24/2018, 9:08 AM

## 2018-07-24 NOTE — ED Notes (Signed)
Pt. CBG 276, RN,Allan made aware.

## 2018-07-24 NOTE — Progress Notes (Signed)
CRITICAL VALUE ALERT  Critical Value:  Potassium 7.0  Date & Time Notied:  0992  Provider Notified: Sheikh  Orders Received/Actions taken: repeat potassium

## 2018-07-25 DIAGNOSIS — R112 Nausea with vomiting, unspecified: Secondary | ICD-10-CM

## 2018-07-25 DIAGNOSIS — K921 Melena: Secondary | ICD-10-CM

## 2018-07-25 DIAGNOSIS — K210 Gastro-esophageal reflux disease with esophagitis: Secondary | ICD-10-CM

## 2018-07-25 DIAGNOSIS — K208 Other esophagitis: Secondary | ICD-10-CM

## 2018-07-25 LAB — COMPREHENSIVE METABOLIC PANEL
ALBUMIN: 3.8 g/dL (ref 3.5–5.0)
ALK PHOS: 58 U/L (ref 38–126)
ALT: 17 U/L (ref 0–44)
AST: 31 U/L (ref 15–41)
Anion gap: 7 (ref 5–15)
BILIRUBIN TOTAL: 1.3 mg/dL — AB (ref 0.3–1.2)
BUN: <5 mg/dL — ABNORMAL LOW (ref 6–20)
CALCIUM: 8.7 mg/dL — AB (ref 8.9–10.3)
CO2: 25 mmol/L (ref 22–32)
CREATININE: 0.51 mg/dL — AB (ref 0.61–1.24)
Chloride: 109 mmol/L (ref 98–111)
GFR calc non Af Amer: >60 mL/min (ref >60)
GLUCOSE: 160 mg/dL — AB (ref 70–99)
Potassium: 3.8 mmol/L (ref 3.5–5.1)
Sodium: 141 mmol/L (ref 135–145)
TOTAL PROTEIN: 6.5 g/dL (ref 6.5–8.1)

## 2018-07-25 LAB — GLUCOSE, CAPILLARY
GLUCOSE-CAPILLARY: 106 mg/dL — AB (ref 70–99)
GLUCOSE-CAPILLARY: 123 mg/dL — AB (ref 70–99)
GLUCOSE-CAPILLARY: 124 mg/dL — AB (ref 70–99)
GLUCOSE-CAPILLARY: 147 mg/dL — AB (ref 70–99)
GLUCOSE-CAPILLARY: 147 mg/dL — AB (ref 70–99)
GLUCOSE-CAPILLARY: 177 mg/dL — AB (ref 70–99)
GLUCOSE-CAPILLARY: 181 mg/dL — AB (ref 70–99)
GLUCOSE-CAPILLARY: 197 mg/dL — AB (ref 70–99)
Glucose-Capillary: 132 mg/dL — ABNORMAL HIGH (ref 70–99)
Glucose-Capillary: 133 mg/dL — ABNORMAL HIGH (ref 70–99)
Glucose-Capillary: 135 mg/dL — ABNORMAL HIGH (ref 70–99)
Glucose-Capillary: 143 mg/dL — ABNORMAL HIGH (ref 70–99)
Glucose-Capillary: 147 mg/dL — ABNORMAL HIGH (ref 70–99)
Glucose-Capillary: 153 mg/dL — ABNORMAL HIGH (ref 70–99)
Glucose-Capillary: 153 mg/dL — ABNORMAL HIGH (ref 70–99)
Glucose-Capillary: 158 mg/dL — ABNORMAL HIGH (ref 70–99)
Glucose-Capillary: 165 mg/dL — ABNORMAL HIGH (ref 70–99)
Glucose-Capillary: 178 mg/dL — ABNORMAL HIGH (ref 70–99)
Glucose-Capillary: 183 mg/dL — ABNORMAL HIGH (ref 70–99)
Glucose-Capillary: 189 mg/dL — ABNORMAL HIGH (ref 70–99)
Glucose-Capillary: 190 mg/dL — ABNORMAL HIGH (ref 70–99)
Glucose-Capillary: 194 mg/dL — ABNORMAL HIGH (ref 70–99)
Glucose-Capillary: 221 mg/dL — ABNORMAL HIGH (ref 70–99)

## 2018-07-25 LAB — CBC WITH DIFFERENTIAL/PLATELET
Basophils Absolute: 0 10*3/uL (ref 0.0–0.1)
Basophils Relative: 0 %
Eosinophils Absolute: 0.2 10*3/uL (ref 0.0–0.7)
Eosinophils Relative: 2 %
HEMATOCRIT: 43.3 % (ref 39.0–52.0)
HEMOGLOBIN: 14.9 g/dL (ref 13.0–17.0)
LYMPHS ABS: 2.6 10*3/uL (ref 0.7–4.0)
LYMPHS PCT: 25 %
MCH: 30.2 pg (ref 26.0–34.0)
MCHC: 34.4 g/dL (ref 30.0–36.0)
MCV: 87.7 fL (ref 78.0–100.0)
MONOS PCT: 9 %
Monocytes Absolute: 0.9 10*3/uL (ref 0.1–1.0)
NEUTROS ABS: 6.9 10*3/uL (ref 1.7–7.7)
Neutrophils Relative %: 64 %
Platelets: 211 10*3/uL (ref 150–400)
RBC: 4.94 MIL/uL (ref 4.22–5.81)
RDW: 12.3 % (ref 11.5–15.5)
WBC: 10.7 10*3/uL — AB (ref 4.0–10.5)

## 2018-07-25 LAB — MAGNESIUM: Magnesium: 2.1 mg/dL (ref 1.7–2.4)

## 2018-07-25 LAB — PHOSPHORUS: Phosphorus: 3.5 mg/dL (ref 2.5–4.6)

## 2018-07-25 MED ORDER — METOCLOPRAMIDE HCL 5 MG/ML IJ SOLN
5.0000 mg | Freq: Three times a day (TID) | INTRAMUSCULAR | Status: DC
  Administered 2018-07-25 – 2018-07-27 (×5): 5 mg via INTRAVENOUS
  Filled 2018-07-25 (×5): qty 2

## 2018-07-25 MED ORDER — LORAZEPAM 2 MG/ML IJ SOLN
1.0000 mg | Freq: Four times a day (QID) | INTRAMUSCULAR | Status: DC | PRN
  Administered 2018-07-25 – 2018-07-27 (×7): 1 mg via INTRAVENOUS
  Filled 2018-07-25 (×6): qty 1

## 2018-07-25 MED ORDER — POTASSIUM CHLORIDE 10 MEQ/100ML IV SOLN
10.0000 meq | INTRAVENOUS | Status: AC
  Administered 2018-07-25 (×3): 10 meq via INTRAVENOUS
  Filled 2018-07-25 (×3): qty 100

## 2018-07-25 MED ORDER — DICYCLOMINE HCL 10 MG PO CAPS
10.0000 mg | ORAL_CAPSULE | Freq: Three times a day (TID) | ORAL | Status: DC
  Administered 2018-07-25 – 2018-07-30 (×20): 10 mg via ORAL
  Filled 2018-07-25 (×24): qty 1

## 2018-07-25 MED ORDER — HYDROXYZINE HCL 25 MG PO TABS
25.0000 mg | ORAL_TABLET | Freq: Once | ORAL | Status: AC
  Administered 2018-07-25: 25 mg via ORAL
  Filled 2018-07-25: qty 1

## 2018-07-25 MED ORDER — LABETALOL HCL 5 MG/ML IV SOLN: 10.0000 mg | INTRAVENOUS | Status: DC | PRN

## 2018-07-25 MED ORDER — PROMETHAZINE HCL 25 MG/ML IJ SOLN
12.5000 mg | Freq: Once | INTRAMUSCULAR | Status: AC
  Administered 2018-07-25: 12.5 mg via INTRAVENOUS
  Filled 2018-07-25 (×2): qty 1

## 2018-07-25 MED ORDER — HYDRALAZINE HCL 20 MG/ML IJ SOLN
10.0000 mg | INTRAMUSCULAR | Status: DC | PRN
  Administered 2018-07-26 – 2018-07-28 (×5): 10 mg via INTRAVENOUS
  Filled 2018-07-25 (×6): qty 1

## 2018-07-25 MED ORDER — LABETALOL HCL 5 MG/ML IV SOLN
10.0000 mg | INTRAVENOUS | Status: DC | PRN
  Administered 2018-07-26 – 2018-07-27 (×2): 10 mg via INTRAVENOUS
  Filled 2018-07-25 (×4): qty 4

## 2018-07-25 NOTE — Progress Notes (Signed)
Inpatient Diabetes Program Recommendations  AACE/ADA: New Consensus Statement on Inpatient Glycemic Control (2015)  Target Ranges:  Prepandial:   less than 140 mg/dL      Peak postprandial:   less than 180 mg/dL (1-2 hours)      Critically ill patients:  140 - 180 mg/dL   Lab Results  Component Value Date   GLUCAP 165 (H) 07/25/2018   HGBA1C 9.4 (H) 06/09/2018    Review of Glycemic Control  32 units of Regular insulin thus far today. NPO.  Spoke with pt about HgbA1C of 9.4%. Pt states he only checks blood sugars in morning and they usually run 140-160 mg/dL. Does not get regular exercise. Pt says he takes his insulin as prescribed, except when nauseated. Recommended he check blood sugars 1-2 hours after meal to see if post-prandial blood sugar is back down to normal.   Also recommended monitoring blood sugars at least 3-4x/day and f/u with endo. (sees Dr. Suzette Battiest)  Will continue to follow closely.  Thank you. Lorenda Peck, RD, LDN, CDE Inpatient Diabetes Coordinator [phone]

## 2018-07-25 NOTE — Progress Notes (Signed)
This RN is taking over nursing care of the patient. Will continue to monitor.  

## 2018-07-25 NOTE — Care Management Note (Signed)
Case Management Note  Patient Details  Name: Grant [name] MRN: [id number] Date of Birth: 03-15-1971  Subjective/Objective:                  47 yo male admitted with coffee ground emesis. Hemodynamically stable, hgb actually high on admit but likely volume depletion. He has known SEVERE ulcerative esophagitis seen on EGD 06/15/18 and was off PPI for a couple of weeks ( out of meds). He has DM with peripheral neuropathy, probably has some degree of diabetic gastroparesis as well raising his risk of reflux.    Action/Plan:    Discharge readiness is indicated by patient meeting Recovery Milestones, including ALL of the following: ? Hypotension absent bp 106/65-164/116 ? Surgical and other acute interventions not needed-not at this time ? Bleeding absent-none present h&h stable ? Stable Hgb/Hct ? Pain absent or managed ? Ambulatory ? Oral hydration, medications, and diet ? NPO/IV-D51/2NS@75ml /hr, iv insulin drip, iv reglan 10mg  q 12 hrs.   Expected Discharge Date:                  Expected Discharge Plan:  Home/Self Care  In-House Referral:     Discharge planning Services  CM Consult  Post Acute Care Choice:    Choice offered to:     DME Arranged:    DME Agency:     HH Arranged:    HH Agency:     Status of Service:  In process, will continue to follow  If discussed at Long Length of Stay Meetings, dates discussed:    Additional Comments:  Leeroy Cha, RN 07/25/2018, 8:57 AM

## 2018-07-25 NOTE — Progress Notes (Addendum)
[facility] Progress Note   Chief Complaint: hematemesis     SUBJECTIVE:   still nauseated today but no vomiting and tolerating Ginger Ale. Not taking pain meds, he knows they slow gut motility.    ASSESSMENT AND PLAN:   1. 47 yo male admitted with nausea / vomiting / coffee ground emesis with known hx of severe ulcerative esophagitis. Hgb actually high on admit but likely volume depletion. Hgb now back to around what is likely his baseline (14-15).  Until nausea / vomiting under control it will be difficult to heal esophageal mucosa. He is better today after some medication adjustments and addition of scopolamine path. Still nauseated but not vomiting. -continue current regimen -he is sipping on clears now. Will advance diet slowly when able.   2. Prolonged QT interval   OBJECTIVE:     Vital signs in last 24 hours: Temp:  [97.9 F (36.6 C)-99 F (37.2 C)] 98.2 F (36.8 C) (08/30 0800) Pulse Rate:  [102-122] 106 (08/30 0640) Resp:  [10-24] 21 (08/30 0027) BP: (106-164)/(65-116) 127/99 (08/30 0640) SpO2:  [96 %-100 %] 97 % (08/30 0640) Last BM Date: 07/23/18 General:   Alert, well-developed, male in NAD EENT:  Normal hearing, non icteric sclera, conjunctive pink.  Heart:  Regular rate and rhythm; no lower extremity edema Pulm: Normal respiratory effort, lungs CTA bilaterally without wheezes or crackles. Abdomen:  Soft, nondistended, nontender.  Normal bowel sounds, no masses felt. Neurologic:  Alert and  oriented x4;  grossly normal neurologically. Psych:  Pleasant, cooperative.  Normal mood and affect.   Intake/Output from previous day: 08/29 0701 - 08/30 0700 In: 2878 [I.V.:2134.9; IV Piggyback:1119.1] Out: 3125 [Urine:3125] Intake/Output this shift: Total I/O In: -  Out: 400 [Urine:400]  Lab Results: Recent Labs    07/23/18 2012 07/24/18 0822 07/25/18 0344  WBC 13.5* 15.1* 10.7*  HGB 17.2* 16.9 14.9  HCT 47.7 47.9 43.3  PLT 239 255 211    BMET Recent Labs    07/24/18 1746 07/24/18 1905 07/25/18 0344  NA 137 140 141  K 7.0* 3.6 3.8  CL 101 106 109  CO2 25 21* 25  GLUCOSE 350* 191* 160*  BUN <5* 5* <5*  CREATININE 0.53* 0.50* 0.51*  CALCIUM 8.8* 8.6* 8.7*   LFT Recent Labs    07/25/18 0344  PROT 6.5  ALBUMIN 3.8  AST 31  ALT 17  ALKPHOS 56  BILITOT 1.3*      Active Problems:   Diabetes mellitus, insulin dependent (IDDM), uncontrolled (HCC)   Essential hypertension   Peripheral neuropathy   Chronic low back pain   Abdominal pain   DKA (diabetic ketoacidoses) (HCC)   Nausea and vomiting   IDDM (insulin dependent diabetes mellitus) ([place])   Gastroparesis   Hematemesis   Type 2 diabetes mellitus with peripheral neuropathy (HCC)   Esophagitis, [place] grade D   Long QT interval   Gastroesophageal reflux disease     LOS: 1 day   Tye Savoy ,NP 07/25/2018, 11:51 AM    Attending physician's note   I have taken an interval history, reviewed the chart and examined the patient. I agree with the Advanced Practitioner's note, impression, and recommendations as outlined.  We will continue high-dose acid suppression therapy along with Carafate for noted [place] grade D erosive esophagitis on EGD last month.  He reports some clinical improvement in the last 24 hours and hoping to increase his p.o. intake today.  We will continue antiemetics but as  noted before difficulty in advancing promotility agents given his prolonged QTC.  Lastly, likely has some component of gastroparesis.  Hopefully this is acute (i.e. postinfectious gastroparesis) rather than early, chronic gastroparesis secondary to diabetes.  The former is more likely to respond to trial of promotility agents to include IV erythromycin, but again difficulty with his prolonged QTC as well as noted long-term side effects with prolonged use.  Given his reported improvement in last 24 hours, will continue with current plan.  However if failing to  progress, will discuss with primary service of short course of IV erythromycin while on telemetry.  [address], DO, FACG [phone] office

## 2018-07-25 NOTE — Progress Notes (Signed)
PROGRESS NOTE    Grant [name]  HLK:[id number] DOB: May 12, 1971 DOA: 07/23/2018 PCP: [name], MD   Brief Narrative:  The patient is a 47 y.o. male, with history of Hypertension, Dyslipidemia, Type 2 diabetes mellitus, Depression, Peripheral Neuropathy, chronic back pain who was recently discharged from hospital on 06/17/2018 after he was diagnosed with acute ulcerative and erosive esophagitis seen on the EGD on 06/15/2018.  Patient was discharged on Protonix twice daily, Reglan and Carafate.  Patient was seen by GI last week in the clinic for ongoing nausea vomiting.  Patient says that intermittently improved but started throwing up this morning.  He complains of more nausea.  Also having coffee-ground emesis. In the ED, lab work revealed mild DKA with anion gap of 17, bicarb 21. Patient started on DKA protocol and admitted for Intractable Nausea and Vomiting. His DKA has resolved but he remains Nauseous and Hospitalization has been complicated by elevated Blood Pressure.   Assessment & Plan:   Active Problems:   Diabetes mellitus, insulin dependent (IDDM), uncontrolled (HCC)   Essential hypertension   Peripheral neuropathy   Chronic low back pain   Abdominal pain   DKA (diabetic ketoacidoses) (HCC)   Nausea and vomiting   IDDM (insulin dependent diabetes mellitus) ([place])   Gastroparesis   Hematemesis   Type 2 diabetes mellitus with peripheral neuropathy (HCC)   Esophagitis, [place] grade D   Long QT interval   Gastroesophageal reflux disease  Intractable nausea vomiting/hematemesis likely in the setting of Diabetic Gastroparesis, improving slowly  -Patient has erosive/ulcerative esophagitis seen on the EGD from 06/15/2018, -GI was consulted by ED physician and they recommended supportive care for now.   -Patient was unable to tolerate antiemetics by mouth at home and was made NPO but diet has been advanced to clears and will advance as tolerated.  -Started Phenergan 12.5 IV  every 6 hours as needed, but will Discontinue and start IV Compazine given prolonged QTc -C/w Protonix 40 mg IV every 12 hours. -GI Consulted for further evaluation and recommending continuing high-dose acid suppression therapy along with adding Carafate -GI feels that he has some component of gastroparesis and feels like hopefully this is acute rather than chronic early gastroparesis secondary to diabetes -GI also feels there is little utility in repeating EGD this soon -Per GI ok to Try Clear Liquids and if no change in Nausea with Antiemetics would likely try a short course of promotility agents including IV Erythromycin x3 days and possibly will administer tomorrow  -IV Reglan changed to 10 mg AC and HS by GI but will change to 5 mg po AC/HS -Elevate HOB -C/w Compazine 10 mg q6hprn N/V -Increased IV Lorazepam 1 mg IV q6hprn Nausea -Appreciate GI Evaluation and Further Recommendations; Advance Diet as tolerated and transition of Insulin gtt when able to tolerate po  -Started patient on Bentyl and Carafate -Advance Diet as Tolerated and if not improving will try short course of IV erythromycin on telemetry tomorrow  DKA mild, improved -Patient has mild DKA, with anion gap of 17, bicarb 21. -Started on DKA protocol and now on D5W 1/2 NS at 75 mL/hr.   -C/w Insulin gtt -Was given IV potassium as per DKA protocol.  -Recent CMP showed CO2 of 25 and AG of 7 -CBG's ranging from 133-165 -Recent HbA1c was 9.4 in July  -Transition to Long Acting when patient is able to tolerate po; GI stated Trial of Clear Liquid Diet but patient still Nauseous and Vomiting some  Prolonged QTc -Ppatient has prolonged QTC with QTc interval of 475, will continue cardiac monitoring in stepdown.   -Was given IV Mag Sulfate -Last QTc was 486 -C/w Telemetry and Serial EKGs -Limit Use of Antiemetics if possible   Accelerated Hypertension -Blood pressure were severely elevated and likely 2/2 to Nausea and Anxiety    -Changed IV Hydralazine 10 mg every 6 hours PRN to q4hprn, also low-dose Metoprolol 2.5 mg IV every 8 hours. -C/w Lorazepam as above -Added IV Labetalol 10 mg q2hprn for SBP >180 or DBP>100  Heart Murmur -Auscultated at the pulmonic area, no previous echo is available.  -ECHOCardiogram done and showed Mitral Trivial Regurgitation  Hypophosphatemia -Patient's Phos was 2.0 and improved to 3.5 -Replete with IV K Phos 20 mmol yesterday -Continue to Monitor and Replete as Necessary -Repeat Phos Level in AM   Leukocytosis -Likely 2/2 to Nausea and Vomiting -WBC was 15.1 and improved to 10.7 -Continue to Monitor for S/Sx of Bleeding -Repeat CBC in AM   DVT prophylaxis: SCDs Code Status: FULL CODE Family Communication: Discussed with wife at bedside  Disposition Plan: Anticipate D/C in the next 48 hours if improving and able to tolerate po and Cleared by Gastroenterology   Consultants:   Gastroenterology Dr. Gerrit Heck   Procedures:  ECHOCARDIOGRAM ------------------------------------------------------------------- Study Conclusions  - Left ventricle: The cavity size was normal. There was moderate   concentric hypertrophy. Systolic function was vigorous. The   estimated ejection fraction was in the range of 65% to 70%. Wall   motion was normal; there were no regional wall motion   abnormalities. The study is not technically sufficient to allow   evaluation of LV diastolic function. - Aortic valve: There was no regurgitation. - Aortic root: The aortic root was normal in size. - Mitral valve: There was trivial regurgitation. - Right ventricle: The cavity size was normal. Wall thickness was   normal. Systolic function was normal. - Right atrium: The atrium was normal in size. - Pulmonary arteries: The main pulmonary artery was normal-sized.   Systolic pressure was within the normal range. - Inferior vena cava: The vessel was normal in size. - Pericardium, extracardiac:  A trivial pericardial effusion was   identified posterior to the heart. Features were not consistent   with tamponade physiology.  Antimicrobials:  Anti-infectives (From admission, onward)   None     Subjective: Patient was seen and examined at bedside and states that he is still bad and still nauseous but vomiting has improved.  Later this afternoon he did vomit once blood pressure became severely elevated.  Denies chest pain, shortness breath, headedness or dizziness but still felt extremely bad and states his abdomen was hurting.  No other concerns or complaints at this time and was wanting to attempt to eat today.  Objective: Vitals:   07/25/18 1400 07/25/18 1430 07/25/18 1500 07/25/18 1559  BP: (!) 182/111 (!) 161/97 (!) 140/105   Pulse:      Resp: 18 (!) 24 13   Temp:    98.6 F (37 C)  TempSrc:    Oral  SpO2:      Weight:      Height:        Intake/Output Summary (Last 24 hours) at 07/25/2018 1615 Last data filed at 07/25/2018 1400 Gross per 24 hour  Intake 2500.2 ml  Output 2075 ml  Net 425.2 ml   Filed Weights   07/23/18 2356 07/24/18 0200  Weight: 102.1 kg 102.5 kg   Examination: Physical  Exam:  Constitutional: Well-nourished, well-developed obese Caucasian male currently in no acute distress appears calm but still appears uncomfortable from being nauseous Eyes: Lids and conjunctive is normal.  Sclera nonicteric ENMT: External ears and nose appear normal.  Grossly normal hearing.  Mucous membranes are improved Neck: Supple with no JVD Respiratory: Diminished to auscultation bilaterally no appreciable wheezing, rales, rhonchi.  Patient not tachypneic wheezing supposedly Cardiovascular: Tachycardic rate but regular rhythm.  Has a slight murmur.  Trace extremity edema Abdomen: Tender to palpate.  Distended secondary body habitus.  Bowel sounds present in 4 quadrants GU: Deferred Musculoskeletal: No clubbing or cyanosis.  No joint deformities.  No  contractures Skin: Skin is warm and dry no appreciable rashes or lesions limited skin evaluation Neurologic: Cranial nerves II through grossly intact no appreciable focal deficits.  Romberg sign and cerebellar focus not assessed Psychiatric: Anxious mood and affect.  Intact judgment insight and appears to try and joke to light in the mood.  Alert and awake and oriented x3  Data Reviewed: I have personally reviewed following labs and imaging studies  CBC: Recent Labs  Lab 07/23/18 2012 07/24/18 0822 07/25/18 0344  WBC 13.5* 15.1* 10.7*  NEUTROABS  --  11.4* 6.9  HGB 17.2* 16.9 14.9  HCT 47.7 47.9 43.3  MCV 85.5 85.2 87.7  PLT 239 255 701   Basic Metabolic Panel: Recent Labs  Lab 07/24/18 0158  07/24/18 0822 07/24/18 1504 07/24/18 1746 07/24/18 1905 07/25/18 0344  NA 139   < > 141 142 137 140 141  K 3.8   < > 3.8 4.3 7.0* 3.6 3.8  CL 104   < > 106 107 101 106 109  CO2 24   < > 24 23 25  21* 25  GLUCOSE 278*   < > 143* 139* 350* 191* 160*  BUN 7   < > 6 5* <5* 5* <5*  CREATININE 0.60*   < > 0.59* 0.57* 0.53* 0.50* 0.51*  CALCIUM 9.2   < > 9.3 9.4 8.8* 8.6* 8.7*  MG 2.3  --   --   --   --   --  2.1  PHOS  --   --  2.0*  --   --   --  3.5   < > = values in this interval not displayed.   GFR: Estimated Creatinine Clearance: 137.7 mL/min (A) (by C-G formula based on SCr of 0.51 mg/dL (L)). Liver Function Tests: Recent Labs  Lab 07/23/18 2012 07/24/18 0822 07/25/18 0344  AST 21 29 31   ALT 17 15 17   ALKPHOS 76 71 58  BILITOT 1.2 1.4* 1.3*  PROT 8.1 7.6 6.5  ALBUMIN 4.6 4.4 3.8   Recent Labs  Lab 07/23/18 2012  LIPASE 30   No results for input(s): AMMONIA in the last 168 hours. Coagulation Profile: No results for input(s): INR, PROTIME in the last 168 hours. Cardiac Enzymes: No results for input(s): CKTOTAL, CKMB, CKMBINDEX, TROPONINI in the last 168 hours. BNP (last 3 results) No results for input(s): PROBNP in the last 8760 hours. HbA1C: No results for  input(s): HGBA1C in the last 72 hours. CBG: Recent Labs  Lab 07/25/18 1145 07/25/18 1251 07/25/18 1348 07/25/18 1450 07/25/18 1553  GLUCAP 197* 178* 190* 165* 133*   Lipid Profile: No results for input(s): CHOL, HDL, LDLCALC, TRIG, CHOLHDL, LDLDIRECT in the last 72 hours. Thyroid Function Tests: No results for input(s): TSH, T4TOTAL, FREET4, T3FREE, THYROIDAB in the last 72 hours. Anemia Panel: No results for input(s):  VITAMINB12, FOLATE, FERRITIN, TIBC, IRON, RETICCTPCT in the last 72 hours. Sepsis Labs: No results for input(s): PROCALCITON, LATICACIDVEN in the last 168 hours.  Recent Results (from the past 240 hour(s))  MRSA PCR Screening     Status: None   Collection Time: 07/24/18  1:33 AM  Result Value Ref Range Status   MRSA by PCR NEGATIVE NEGATIVE Final    Comment:        The GeneXpert MRSA Assay (FDA approved for NASAL specimens only), is one component of a comprehensive MRSA colonization surveillance program. It is not intended to diagnose MRSA infection nor to guide or monitor treatment for MRSA infections. Performed at [facility], [place] [address] [place], [place] [address]     Radiology Studies: No results found.   Scheduled Meds: . dicyclomine  10 mg Oral TID AC & HS  . metoCLOPramide (REGLAN) injection  5 mg Intravenous Q8H  . metoprolol tartrate  2.5 mg Intravenous Q8H  . [START ON 07/27/2018] pantoprazole  40 mg Intravenous Q12H  . scopolamine  1 patch Transdermal Q72H  . sucralfate  1 g Oral Q6H   Continuous Infusions: . dextrose 5 % and 0.45% NaCl 75 mL/hr at 07/25/18 1400  . insulin (NOVOLIN-R) infusion 2.6 Units/hr (07/25/18 1600)    LOS: 1 day   Kerney Elbe, DO [facility] PAGER is on [place]  If 7PM-7AM, please contact night-coverage [email or web address] Password [email or web address] 07/25/2018, 4:15 PM

## 2018-07-25 NOTE — Progress Notes (Signed)
Inpatient Diabetes Program Recommendations  AACE/ADA: New Consensus Statement on Inpatient Glycemic Control (2015)  Target Ranges:  Prepandial:   less than 140 mg/dL      Peak postprandial:   less than 180 mg/dL (1-2 hours)      Critically ill patients:  140 - 180 mg/dL   Lab Results  Component Value Date   GLUCAP 153 (H) 07/25/2018   HGBA1C 9.4 (H) 06/09/2018    Review of Glycemic Control  Diabetes history: DM2 Outpatient Diabetes medications: NPH 30 in am and 40 QHS, Novolin R 4-8 units tidwc Current orders for Inpatient glycemic control: IV insulin  Inpatient Diabetes Program Recommendations:     Lantus 40 units daily (0.4 units/kg dosing) (give at least 1 hour prior to drip being stopped) Novolog Moderate SSI (0-15 units) Q4 hours x 12H, then tidwc and hs.  Will speak with pt after lunch today.  Thank you. Lorenda Peck, RD, LDN, CDE Inpatient Diabetes Coordinator [phone]

## 2018-07-26 ENCOUNTER — Inpatient Hospital Stay: Payer: Self-pay

## 2018-07-26 LAB — GLUCOSE, CAPILLARY
GLUCOSE-CAPILLARY: 117 mg/dL — AB (ref 70–99)
GLUCOSE-CAPILLARY: 122 mg/dL — AB (ref 70–99)
GLUCOSE-CAPILLARY: 126 mg/dL — AB (ref 70–99)
GLUCOSE-CAPILLARY: 129 mg/dL — AB (ref 70–99)
GLUCOSE-CAPILLARY: 140 mg/dL — AB (ref 70–99)
GLUCOSE-CAPILLARY: 141 mg/dL — AB (ref 70–99)
GLUCOSE-CAPILLARY: 155 mg/dL — AB (ref 70–99)
GLUCOSE-CAPILLARY: 164 mg/dL — AB (ref 70–99)
GLUCOSE-CAPILLARY: 171 mg/dL — AB (ref 70–99)
GLUCOSE-CAPILLARY: 174 mg/dL — AB (ref 70–99)
GLUCOSE-CAPILLARY: 175 mg/dL — AB (ref 70–99)
GLUCOSE-CAPILLARY: 186 mg/dL — AB (ref 70–99)
GLUCOSE-CAPILLARY: 194 mg/dL — AB (ref 70–99)
GLUCOSE-CAPILLARY: 217 mg/dL — AB (ref 70–99)
Glucose-Capillary: 114 mg/dL — ABNORMAL HIGH (ref 70–99)
Glucose-Capillary: 141 mg/dL — ABNORMAL HIGH (ref 70–99)
Glucose-Capillary: 146 mg/dL — ABNORMAL HIGH (ref 70–99)
Glucose-Capillary: 164 mg/dL — ABNORMAL HIGH (ref 70–99)
Glucose-Capillary: 193 mg/dL — ABNORMAL HIGH (ref 70–99)
Glucose-Capillary: 211 mg/dL — ABNORMAL HIGH (ref 70–99)
Glucose-Capillary: 99 mg/dL (ref 70–99)

## 2018-07-26 LAB — CBC WITH DIFFERENTIAL/PLATELET
BASOS ABS: 0 10*3/uL (ref 0.0–0.1)
BASOS PCT: 0 %
Eosinophils Absolute: 0.2 10*3/uL (ref 0.0–0.7)
Eosinophils Relative: 1 %
HEMATOCRIT: 47 % (ref 39.0–52.0)
HEMOGLOBIN: 16.4 g/dL (ref 13.0–17.0)
Lymphocytes Relative: 28 %
Lymphs Abs: 3.7 10*3/uL (ref 0.7–4.0)
MCH: 30.3 pg (ref 26.0–34.0)
MCHC: 34.9 g/dL (ref 30.0–36.0)
MCV: 86.9 fL (ref 78.0–100.0)
MONO ABS: 1.2 10*3/uL — AB (ref 0.1–1.0)
Monocytes Relative: 9 %
NEUTROS ABS: 7.9 10*3/uL — AB (ref 1.7–7.7)
NEUTROS PCT: 62 %
Platelets: 260 10*3/uL (ref 150–400)
RBC: 5.41 MIL/uL (ref 4.22–5.81)
RDW: 12.1 % (ref 11.5–15.5)
WBC: 13.1 10*3/uL — AB (ref 4.0–10.5)

## 2018-07-26 LAB — BASIC METABOLIC PANEL
ANION GAP: 9 (ref 5–15)
BUN: 8 mg/dL (ref 6–20)
CHLORIDE: 107 mmol/L (ref 98–111)
CO2: 22 mmol/L (ref 22–32)
Calcium: 9.2 mg/dL (ref 8.9–10.3)
Creatinine, Ser: 0.65 mg/dL (ref 0.61–1.24)
Glucose, Bld: 285 mg/dL — ABNORMAL HIGH (ref 70–99)
POTASSIUM: 3.4 mmol/L — AB (ref 3.5–5.1)
SODIUM: 138 mmol/L (ref 135–145)

## 2018-07-26 LAB — COMPREHENSIVE METABOLIC PANEL
ALK PHOS: 65 U/L (ref 38–126)
ALT: 19 U/L (ref 0–44)
AST: 22 U/L (ref 15–41)
Albumin: 4.3 g/dL (ref 3.5–5.0)
Anion gap: 13 (ref 5–15)
BILIRUBIN TOTAL: 1.2 mg/dL (ref 0.3–1.2)
BUN: 5 mg/dL — AB (ref 6–20)
CALCIUM: 9.4 mg/dL (ref 8.9–10.3)
CO2: 23 mmol/L (ref 22–32)
CREATININE: 0.51 mg/dL — AB (ref 0.61–1.24)
Chloride: 105 mmol/L (ref 98–111)
GFR calc Af Amer: >60 mL/min (ref >60)
GLUCOSE: 117 mg/dL — AB (ref 70–99)
Potassium: 3.3 mmol/L — ABNORMAL LOW (ref 3.5–5.1)
Sodium: 141 mmol/L (ref 135–145)
TOTAL PROTEIN: 7.1 g/dL (ref 6.5–8.1)

## 2018-07-26 LAB — MAGNESIUM: MAGNESIUM: 1.9 mg/dL (ref 1.7–2.4)

## 2018-07-26 LAB — PHOSPHORUS: Phosphorus: 3.5 mg/dL (ref 2.5–4.6)

## 2018-07-26 MED ORDER — SODIUM CHLORIDE 0.9 % IV BOLUS
500.0000 mL | Freq: Once | INTRAVENOUS | Status: AC
  Administered 2018-07-26: 500 mL via INTRAVENOUS

## 2018-07-26 MED ORDER — POTASSIUM CHLORIDE 10 MEQ/100ML IV SOLN
10.0000 meq | INTRAVENOUS | Status: DC
  Administered 2018-07-26 (×2): 10 meq via INTRAVENOUS
  Filled 2018-07-26 (×2): qty 100

## 2018-07-26 MED ORDER — POTASSIUM CHLORIDE CRYS ER 20 MEQ PO TBCR
40.0000 meq | EXTENDED_RELEASE_TABLET | Freq: Once | ORAL | Status: AC
  Administered 2018-07-26: 40 meq via ORAL
  Filled 2018-07-26: qty 2

## 2018-07-26 MED ORDER — HYDROXYZINE HCL 25 MG PO TABS
25.0000 mg | ORAL_TABLET | Freq: Four times a day (QID) | ORAL | Status: DC | PRN
  Administered 2018-07-26 – 2018-07-28 (×3): 25 mg via ORAL
  Filled 2018-07-26 (×4): qty 1

## 2018-07-26 MED ORDER — FENTANYL CITRATE (PF) 100 MCG/2ML IJ SOLN
12.5000 ug | INTRAMUSCULAR | Status: DC | PRN
  Administered 2018-07-26 – 2018-07-28 (×5): 12.5 ug via INTRAVENOUS
  Filled 2018-07-26 (×5): qty 2

## 2018-07-26 MED ORDER — DEXAMETHASONE SODIUM PHOSPHATE 4 MG/ML IJ SOLN
4.0000 mg | Freq: Once | INTRAMUSCULAR | Status: AC
  Administered 2018-07-26: 4 mg via INTRAVENOUS
  Filled 2018-07-26: qty 1

## 2018-07-26 MED ORDER — INSULIN GLARGINE 100 UNIT/ML ~~LOC~~ SOLN
15.0000 [IU] | Freq: Every day | SUBCUTANEOUS | Status: DC
  Administered 2018-07-26 – 2018-07-29 (×4): 15 [IU] via SUBCUTANEOUS
  Filled 2018-07-26 (×5): qty 0.15

## 2018-07-26 MED ORDER — LORAZEPAM 2 MG/ML IJ SOLN
2.0000 mg | INTRAMUSCULAR | Status: DC | PRN
  Administered 2018-07-27 – 2018-07-29 (×5): 2 mg via INTRAVENOUS
  Filled 2018-07-26 (×6): qty 1

## 2018-07-26 MED ORDER — DIPHENHYDRAMINE HCL 50 MG/ML IJ SOLN
25.0000 mg | Freq: Three times a day (TID) | INTRAMUSCULAR | Status: DC | PRN
  Administered 2018-07-28: 25 mg via INTRAVENOUS
  Filled 2018-07-26: qty 1

## 2018-07-26 MED ORDER — PAROXETINE HCL 10 MG PO TABS
10.0000 mg | ORAL_TABLET | Freq: Two times a day (BID) | ORAL | Status: DC
  Administered 2018-07-26 – 2018-07-30 (×9): 10 mg via ORAL
  Filled 2018-07-26 (×9): qty 1

## 2018-07-26 MED ORDER — INSULIN ASPART 100 UNIT/ML ~~LOC~~ SOLN
0.0000 [IU] | SUBCUTANEOUS | Status: DC
  Administered 2018-07-27 (×2): 3 [IU] via SUBCUTANEOUS
  Administered 2018-07-27: 5 [IU] via SUBCUTANEOUS
  Administered 2018-07-27: 3 [IU] via SUBCUTANEOUS
  Administered 2018-07-27: 5 [IU] via SUBCUTANEOUS
  Administered 2018-07-27: 3 [IU] via SUBCUTANEOUS
  Administered 2018-07-28: 2 [IU] via SUBCUTANEOUS
  Administered 2018-07-28 – 2018-07-29 (×3): 3 [IU] via SUBCUTANEOUS
  Administered 2018-07-29 – 2018-07-30 (×4): 2 [IU] via SUBCUTANEOUS

## 2018-07-26 MED ORDER — CHLORHEXIDINE GLUCONATE CLOTH 2 % EX PADS
6.0000 | MEDICATED_PAD | Freq: Every day | CUTANEOUS | Status: DC
  Administered 2018-07-26 – 2018-07-29 (×3): 6 via TOPICAL

## 2018-07-26 MED ORDER — SODIUM CHLORIDE 0.9% FLUSH
10.0000 mL | Freq: Two times a day (BID) | INTRAVENOUS | Status: DC
  Administered 2018-07-27 – 2018-07-30 (×8): 10 mL

## 2018-07-26 MED ORDER — AMLODIPINE BESYLATE 5 MG PO TABS
5.0000 mg | ORAL_TABLET | Freq: Every day | ORAL | Status: DC
  Administered 2018-07-26: 5 mg via ORAL
  Filled 2018-07-26: qty 1

## 2018-07-26 MED ORDER — SODIUM CHLORIDE 0.9% FLUSH: 10.0000 mL | INTRAVENOUS | Status: DC | PRN

## 2018-07-26 NOTE — Progress Notes (Signed)
PROGRESS NOTE    Grant [name]  MVH:[id number] DOB: 1971/02/15 DOA: 07/23/2018 PCP: [name], MD   Brief Narrative:  The patient is a 47 y.o. male, with history of Hypertension, Dyslipidemia, Type 2 diabetes mellitus, Depression, Peripheral Neuropathy, chronic back pain who was recently discharged from hospital on 06/17/2018 after he was diagnosed with acute ulcerative and erosive esophagitis seen on the EGD on 06/15/2018.  Patient was discharged on Protonix twice daily, Reglan and Carafate.  Patient was seen by GI last week in the clinic for ongoing nausea vomiting.  Patient says that intermittently improved but started throwing up this morning.  He complains of more nausea.  Also having coffee-ground emesis. In the ED, lab work revealed mild DKA with anion gap of 17, bicarb 21. Patient started on DKA protocol and admitted for Intractable Nausea and Vomiting. His DKA has resolved but he remains Nauseous and Hospitalization has been complicated by elevated Blood Pressure from Nausea and Vomiting along with Anxiety.   Assessment & Plan:   Active Problems:   Diabetes mellitus, insulin dependent (IDDM), uncontrolled (HCC)   Essential hypertension   Peripheral neuropathy   Chronic low back pain   Abdominal pain   DKA (diabetic ketoacidoses) (HCC)   Nausea and vomiting   IDDM (insulin dependent diabetes mellitus) ([place])   Gastroparesis   Hematemesis   Type 2 diabetes mellitus with peripheral neuropathy (HCC)   Esophagitis, [place] grade D   Long QT interval   Gastroesophageal reflux disease  Intractable nausea vomiting/hematemesis likely in the setting of Diabetic Gastroparesis, improving slowly but still remains intermittently Nauseous and has some Vomiting and Abdominal Pain  -Patient has erosive/ulcerative esophagitis seen on the EGD from 06/15/2018, -GI was consulted by ED physician and they recommended supportive care for now and continuing current plan.   -Patient was unable  to tolerate antiemetics by mouth at home and was made NPO but diet has been advanced to clears and will advance as tolerated and has now been advanced to FULL Liquid Diet.  -Started Phenergan 12.5 IV every 6 hours as needed, but will Discontinue and start IV Compazine q6hprn given prolonged QTc -C/w Protonix 40 mg IV every 12 hours. -GI Consulted for further evaluation and recommending continuing high-dose acid suppression therapy along with adding Carafate -GI feels that he has some component of gastroparesis and feels like hopefully this is acute rather than chronic early gastroparesis secondary to diabetes -GI also feels there is little utility in repeating EGD this soon during hospitalization -Per GI ok to Try Clear Liquids and if no change in Nausea with Antiemetics would likely try a short course of promotility agents including IV Erythromycin x3 days but currently will hold  -IV Reglan changed to 10 mg AC and HS by GI but will change to 5 mg po AC/HS -Elevate HOB -C/w Compazine 10 mg q6hprn N/V; Also added Benadryl 25 mg q8hprn for Refractory N/V -Also will try Dexamethasone 4 mg IV once  -Increased IV Lorazepam 1 mg IV q6hprn Nausea -Appreciate GI Evaluation and Further Recommendations; Advance Diet as tolerated and transition of Insulin gtt when able to tolerate po  -Started patient on Bentyl and Carafate and will continue  -Advance Diet as Tolerated and will try FULL Diet today  -Given that patient keeps losing his IV access will place PICC line   DKA mild, improved -Patient has mild DKA, with anion gap of 17, bicarb 21. -Started on DKA protocol and now on D5W 1/2 NS at 50  mL/hr.   -C/w Insulin gtt -Was given IV potassium as per DKA protocol.  -Recent CMP showed CO2 of 23 and AG of 13 -CBG's ranging from 140-217 -Recent HbA1c was 9.4 in July  -Transition to Long Acting when patient is able to tolerate po; GI stated Trial of Clear Liquid Diet but patient still Nauseous and Vomiting  some   Prolonged QTc -Patient had prolonged QTC with QTc interval of 475 on admission will continue cardiac monitoring in stepdown.  -Was given IV Mag Sulfate -Last QTc was 514 -C/w Telemetry and Serial EKGs -Limit Use of QT Prolonging Antiemetics if possible   Accelerated Hypertension -Blood pressure were severely elevated and likely 2/2 to Nausea and Anxiety  -Changed IV Hydralazine 10 mg every 6 hours PRN to q4hprn, also low-dose Metoprolol 2.5 mg IV every 8 hours. -C/w Lorazepam as above -Added IV Labetalol 10 mg q2hprn for SBP >180 or DBP>100 -Resumed Home Amlodipine 5 mg po Daily   Heart Murmur -Auscultated at the pulmonic area, no previous echo is available.  -ECHOCardiogram done and showed Mitral Trivial Regurgitation  Hypophosphatemia -Patient's Phos was 2.0 and improved to 3.5 -Replete with IV K Phos 20 mmol yesterday -Continue to Monitor and Replete as Necessary -Repeat Phos Level in AM   Leukocytosis -Likely 2/2 to Nausea and Vomiting -WBC was 15.1 and improved to 10.7 but now back up to 13.1 -Continue to Monitor for S/Sx of Bleeding -Repeat CBC in AM   Hypokalemia -Patient's potassium was 3.3  -Replete with IV potassium chloride 40 mill:'s -Continue monitor and replete as necessary -Repeat CMP in the a.m.  DVT prophylaxis: SCDs Code Status: FULL CODE Family Communication: Discussed with wife at bedside  Disposition Plan: Anticipate D/C in the next 48 hours if improving and able to tolerate po and Cleared by Gastroenterology   Consultants:   Gastroenterology Dr. Luanna Salk Cirigliano/Dr. Benson [place]   Procedures:  ECHOCARDIOGRAM ------------------------------------------------------------------- Study Conclusions  - Left ventricle: The cavity size was normal. There was moderate   concentric hypertrophy. Systolic function was vigorous. The   estimated ejection fraction was in the range of 65% to 70%. Wall   motion was normal; there were no regional wall  motion   abnormalities. The study is not technically sufficient to allow   evaluation of LV diastolic function. - Aortic valve: There was no regurgitation. - Aortic root: The aortic root was normal in size. - Mitral valve: There was trivial regurgitation. - Right ventricle: The cavity size was normal. Wall thickness was   normal. Systolic function was normal. - Right atrium: The atrium was normal in size. - Pulmonary arteries: The main pulmonary artery was normal-sized.   Systolic pressure was within the normal range. - Inferior vena cava: The vessel was normal in size. - Pericardium, extracardiac: A trivial pericardial effusion was   identified posterior to the heart. Features were not consistent   with tamponade physiology.  Antimicrobials:  Anti-infectives (From admission, onward)   None     Subjective: Patient was seen and examined at bedside and states that he is doing well now however he had a very rough night and still continues to be nauseous.  Vomited 5 times overnight.  States that whenever he vomits his blood pressure goes up.  No chest pain, lightheadedness or dizziness.  Frustrated and wants to try and eat however does not understand why he was vomiting.  No other concerns or plans at this time.  Objective: Vitals:   07/26/18 0830 07/26/18 0900 07/26/18 0930  07/26/18 1000  BP: (!) 124/99 (!) 81/49 113/85 (!) 184/113  Pulse: (!) 115 (!) 112 (!) 109 (!) 116  Resp: 17 20 20 16   Temp:      TempSrc:      SpO2: 97% 97% 99% 100%  Weight:      Height:        Intake/Output Summary (Last 24 hours) at 07/26/2018 1144 Last data filed at 07/26/2018 1137 Gross per 24 hour  Intake 2622.78 ml  Output 1176 ml  Net 1446.78 ml   Filed Weights   07/23/18 2356 07/24/18 0200  Weight: 102.1 kg 102.5 kg   Examination: Physical Exam:  Constitutional: Well-nourished, well-developed obese Caucasian male is currently no acute distress and feels better this morning after vomiting 5  times overnight Eyes: Sclera anicteric.  Lids and conjunctive are normal ENMT: External ears and nose appear normal.  Grossly normal hearing.  Mucous members are moist Neck: Supple no JVD Respiratory: Diminished to auscultation bilaterally with no appreciable wheezing, rales, rhonchi.  Patient is not tachypneic or using any accessory muscles to breathe Cardiovascular: Tachycardic rate but regular rhythm.  Has a slight murmur appreciated.  Mild lower extremity edema Abdomen: Soft but slightly tender to palpate.  Distended secondary body habitus.  Bowel sounds present all 4 quadrants GU: Deferred Musculoskeletal: No contractures cyanosis.  No joint deformities noted Skin: Skin is warm and dry no appreciable rashes or lesions on limited skin evaluation Neurologic: Cranial nerves II through XII grossly intact with no appreciable focal deficits.  Romberg sign and cerebellar reflexes were not assessed Psychiatric: Anxious again.  But a normal affect.  Intact judgment insight.  Awake and alert and oriented x3  Data Reviewed: I have personally reviewed following labs and imaging studies  CBC: Recent Labs  Lab 07/23/18 2012 07/24/18 0822 07/25/18 0344 07/26/18 0331  WBC 13.5* 15.1* 10.7* 13.1*  NEUTROABS  --  11.4* 6.9 7.9*  HGB 17.2* 16.9 14.9 16.4  HCT 47.7 47.9 43.3 47.0  MCV 85.5 85.2 87.7 86.9  PLT 239 255 211 458   Basic Metabolic Panel: Recent Labs  Lab 07/24/18 0158  07/24/18 0822 07/24/18 1504 07/24/18 1746 07/24/18 1905 07/25/18 0344 07/26/18 0331  NA 139   < > 141 142 137 140 141 141  K 3.8   < > 3.8 4.3 7.0* 3.6 3.8 3.3*  CL 104   < > 106 107 101 106 109 105  CO2 24   < > 24 23 25  21* 25 23  GLUCOSE 278*   < > 143* 139* 350* 191* 160* 117*  BUN 7   < > 6 5* <5* 5* <5* 5*  CREATININE 0.60*   < > 0.59* 0.57* 0.53* 0.50* 0.51* 0.51*  CALCIUM 9.2   < > 9.3 9.4 8.8* 8.6* 8.7* 9.4  MG 2.3  --   --   --   --   --  2.1 1.9  PHOS  --   --  2.0*  --   --   --  3.5 3.5   < >  = values in this interval not displayed.   GFR: Estimated Creatinine Clearance: 137.7 mL/min (A) (by C-G formula based on SCr of 0.51 mg/dL (L)). Liver Function Tests: Recent Labs  Lab 07/23/18 2012 07/24/18 0822 07/25/18 0344 07/26/18 0331  AST 21 29 31 22   ALT 17 15 17 19   ALKPHOS 76 71 58 65  BILITOT 1.2 1.4* 1.3* 1.2  PROT 8.1 7.6 6.5 7.1  ALBUMIN 4.6  4.4 3.8 4.3   Recent Labs  Lab 07/23/18 2012  LIPASE 30   No results for input(s): AMMONIA in the last 168 hours. Coagulation Profile: No results for input(s): INR, PROTIME in the last 168 hours. Cardiac Enzymes: No results for input(s): CKTOTAL, CKMB, CKMBINDEX, TROPONINI in the last 168 hours. BNP (last 3 results) No results for input(s): PROBNP in the last 8760 hours. HbA1C: No results for input(s): HGBA1C in the last 72 hours. CBG: Recent Labs  Lab 07/26/18 0644 07/26/18 0743 07/26/18 0848 07/26/18 0956 07/26/18 1108  GLUCAP 186* 211* 217* 171* 140*   Lipid Profile: No results for input(s): CHOL, HDL, LDLCALC, TRIG, CHOLHDL, LDLDIRECT in the last 72 hours. Thyroid Function Tests: No results for input(s): TSH, T4TOTAL, FREET4, T3FREE, THYROIDAB in the last 72 hours. Anemia Panel: No results for input(s): VITAMINB12, FOLATE, FERRITIN, TIBC, IRON, RETICCTPCT in the last 72 hours. Sepsis Labs: No results for input(s): PROCALCITON, LATICACIDVEN in the last 168 hours.  Recent Results (from the past 240 hour(s))  MRSA PCR Screening     Status: None   Collection Time: 07/24/18  1:33 AM  Result Value Ref Range Status   MRSA by PCR NEGATIVE NEGATIVE Final    Comment:        The GeneXpert MRSA Assay (FDA approved for NASAL specimens only), is one component of a comprehensive MRSA colonization surveillance program. It is not intended to diagnose MRSA infection nor to guide or monitor treatment for MRSA infections. Performed at [facility], [place] [address] [place], [place] [address]      Radiology Studies: [place] Ekg Site Rite  Result Date: 07/26/2018 If [facility] image not attached, placement could not be confirmed due to current cardiac rhythm.    Scheduled Meds: . amLODipine  5 mg Oral Daily  . dicyclomine  10 mg Oral TID AC & HS  . metoCLOPramide (REGLAN) injection  5 mg Intravenous Q8H  . metoprolol tartrate  2.5 mg Intravenous Q8H  . [START ON 07/27/2018] pantoprazole  40 mg Intravenous Q12H  . PARoxetine  10 mg Oral BID  . scopolamine  1 patch Transdermal Q72H  . sucralfate  1 g Oral Q6H   Continuous Infusions: . dextrose 5 % and 0.45% NaCl 50 mL/hr at 07/26/18 1137  . insulin (NOVOLIN-R) infusion 3.7 mL/hr at 07/26/18 1137  . potassium chloride 100 mL/hr at 07/26/18 1137    LOS: 2 days   Kerney Elbe, DO [facility] PAGER is on AMION  If 7PM-7AM, please contact night-coverage [email or web address] Password [facility] 07/26/2018, 11:44 AM

## 2018-07-26 NOTE — Progress Notes (Signed)
Peripherally Inserted Central Catheter/Midline Placement  The IV Nurse has discussed with the patient and/or persons authorized to consent for the patient, the purpose of this procedure and the potential benefits and risks involved with this procedure.  The benefits include less needle sticks, lab draws from the catheter, and the patient may be discharged home with the catheter. Risks include, but not limited to, infection, bleeding, blood clot (thrombus formation), and puncture of an artery; nerve damage and irregular heartbeat and possibility to perform a PICC exchange if needed/ordered by physician.  Alternatives to this procedure were also discussed.  Bard Power PICC patient education guide, fact sheet on infection prevention and patient information card has been provided to patient /or left at bedside.    PICC/Midline Placement Documentation  PICC Double Lumen 07/26/18 PICC Right Brachial 40 cm 0 cm (Active)  Indication for Insertion or Continuance of Line Vasoactive infusions;Limited venous access - need for IV therapy >5 days (PICC only);Poor Vasculature-patient has had multiple peripheral attempts or PIVs lasting less than 24 hours 07/26/2018  5:00 PM  Exposed Catheter (cm) 0 cm 07/26/2018  5:00 PM  Site Assessment Clean;Dry;Intact 07/26/2018  5:00 PM  Lumen #1 Status Flushed;Saline locked;Blood return noted 07/26/2018  5:00 PM  Lumen #2 Status Flushed;Saline locked;Blood return noted 07/26/2018  5:00 PM  Dressing Type Transparent 07/26/2018  5:00 PM  Dressing Status Clean;Dry;Intact;Antimicrobial disc in place 07/26/2018  5:00 PM  Line Care Connections checked and tightened 07/26/2018  5:00 PM  Line Adjustment (NICU/IV Team Only) No 07/26/2018  5:00 PM  Dressing Intervention New dressing 07/26/2018  5:00 PM  Dressing Change Due 08/02/18 07/26/2018  5:00 PM       Rolena Infante 07/26/2018, 5:41 PM

## 2018-07-26 NOTE — Progress Notes (Signed)
Subjective: Feeling terrible.  ABM pain, nausea, and vomiting.  Objective: Vital signs in last 24 hours: Temp:  [98.1 F (36.7 C)-98.6 F (37 C)] 98.2 F (36.8 C) (08/31 0755) Pulse Rate:  [105-131] 124 (08/31 0500) Resp:  [13-36] 25 (08/31 0500) BP: (108-225)/(80-138) 225/121 (08/31 0613) SpO2:  [97 %-99 %] 97 % (08/31 0500) Last BM Date: 07/25/18  Intake/Output from previous day: 08/30 0701 - 08/31 0700 In: 2076.5 [P.O.:120; I.V.:1856.5; IV Piggyback:100] Out: 7622 [Urine:1325; Emesis/NG output:251] Intake/Output this shift: Total I/O In: 59.7 [I.V.:59.7] Out: -   General appearance: uncomfortable, alert GI: soft, non-tender; bowel sounds normal; no masses,  no organomegaly  Lab Results: Recent Labs    07/24/18 0822 07/25/18 0344 07/26/18 0331  WBC 15.1* 10.7* 13.1*  HGB 16.9 14.9 16.4  HCT 47.9 43.3 47.0  PLT 255 211 260   BMET Recent Labs    07/24/18 1905 07/25/18 0344 07/26/18 0331  NA 140 141 141  K 3.6 3.8 3.3*  CL 106 109 105  CO2 21* 25 23  GLUCOSE 191* 160* 117*  BUN 5* <5* 5*  CREATININE 0.50* 0.51* 0.51*  CALCIUM 8.6* 8.7* 9.4   LFT Recent Labs    07/26/18 0331  PROT 7.1  ALBUMIN 4.3  AST 22  ALT 19  ALKPHOS 65  BILITOT 1.2   PT/INR No results for input(s): LABPROT, INR in the last 72 hours. Hepatitis Panel No results for input(s): HEPBSAG, HCVAB, HEPAIGM, HEPBIGM in the last 72 hours. C-Diff No results for input(s): CDIFFTOX in the last 72 hours. Fecal Lactopherrin No results for input(s): FECLLACTOFRN in the last 72 hours.  Studies/Results: No results found.  Medications:  Scheduled: . dicyclomine  10 mg Oral TID AC & HS  . metoCLOPramide (REGLAN) injection  5 mg Intravenous Q8H  . metoprolol tartrate  2.5 mg Intravenous Q8H  . [START ON 07/27/2018] pantoprazole  40 mg Intravenous Q12H  . scopolamine  1 patch Transdermal Q72H  . sucralfate  1 g Oral Q6H   Continuous: . dextrose 5 % and 0.45% NaCl 75 mL/hr at 07/26/18  0753  . insulin (NOVOLIN-R) infusion 5.4 mL/hr at 07/26/18 0753    Assessment/Plan: 1) History of [place] Grade D esophagitis with distal esophageal ulceration - ? Primary (GERD) versus Secondary (Nausea/Vomiting from DKA and presumed gastroparesis). 2) ABM pain. 3) DKA - resolved. 4) History of poorly controlled DM. 5) HTN.   Clinically he is not well, but his DKA has resolved.  In the past he reported two other episodes of DKA.  The last documented hospitalization for nausea and vomiting was in 2017 and he left AMA.  He missed approximately one week of his PPI and Relgan regimen after his recent hospitalization.  In the past his symptoms of nausea and vomiting improved in a short timeframe.  With his continued poor control of his DM, it may be that his issues will take longer to resolve.    Plan: 1) Continue with supportive care.   LOS: 2 days   Almus Woodham D 07/26/2018, 8:27 AM

## 2018-07-27 LAB — PHOSPHORUS: Phosphorus: 3.6 mg/dL (ref 2.5–4.6)

## 2018-07-27 LAB — GLUCOSE, CAPILLARY
GLUCOSE-CAPILLARY: 177 mg/dL — AB (ref 70–99)
GLUCOSE-CAPILLARY: 156 mg/dL — AB (ref 70–99)
GLUCOSE-CAPILLARY: 174 mg/dL — AB (ref 70–99)
Glucose-Capillary: 180 mg/dL — ABNORMAL HIGH (ref 70–99)
Glucose-Capillary: 184 mg/dL — ABNORMAL HIGH (ref 70–99)
Glucose-Capillary: 208 mg/dL — ABNORMAL HIGH (ref 70–99)
Glucose-Capillary: 242 mg/dL — ABNORMAL HIGH (ref 70–99)

## 2018-07-27 LAB — COMPREHENSIVE METABOLIC PANEL
ALK PHOS: 60 U/L (ref 38–126)
ALT: 18 U/L (ref 0–44)
AST: 18 U/L (ref 15–41)
Albumin: 4.2 g/dL (ref 3.5–5.0)
Anion gap: 10 (ref 5–15)
BUN: 10 mg/dL (ref 6–20)
CHLORIDE: 108 mmol/L (ref 98–111)
CO2: 23 mmol/L (ref 22–32)
Calcium: 9.6 mg/dL (ref 8.9–10.3)
Creatinine, Ser: 0.53 mg/dL — ABNORMAL LOW (ref 0.61–1.24)
GFR calc Af Amer: >60 mL/min (ref >60)
GFR calc non Af Amer: >60 mL/min (ref >60)
GLUCOSE: 187 mg/dL — AB (ref 70–99)
POTASSIUM: 3.8 mmol/L (ref 3.5–5.1)
SODIUM: 141 mmol/L (ref 135–145)
Total Bilirubin: 1.1 mg/dL (ref 0.3–1.2)
Total Protein: 6.8 g/dL (ref 6.5–8.1)

## 2018-07-27 LAB — CBC WITH DIFFERENTIAL/PLATELET
BASOS PCT: 0 %
Basophils Absolute: 0 10*3/uL (ref 0.0–0.1)
EOS ABS: 0 10*3/uL (ref 0.0–0.7)
Eosinophils Relative: 0 %
HCT: 44.5 % (ref 39.0–52.0)
HEMOGLOBIN: 15.5 g/dL (ref 13.0–17.0)
LYMPHS ABS: 2.1 10*3/uL (ref 0.7–4.0)
Lymphocytes Relative: 16 %
MCH: 30.1 pg (ref 26.0–34.0)
MCHC: 34.8 g/dL (ref 30.0–36.0)
MCV: 86.4 fL (ref 78.0–100.0)
Monocytes Absolute: 1 10*3/uL (ref 0.1–1.0)
Monocytes Relative: 8 %
NEUTROS PCT: 76 %
Neutro Abs: 9.9 10*3/uL — ABNORMAL HIGH (ref 1.7–7.7)
PLATELETS: 259 10*3/uL (ref 150–400)
RBC: 5.15 MIL/uL (ref 4.22–5.81)
RDW: 12.2 % (ref 11.5–15.5)
WBC: 13 10*3/uL — AB (ref 4.0–10.5)

## 2018-07-27 LAB — MAGNESIUM: MAGNESIUM: 1.8 mg/dL (ref 1.7–2.4)

## 2018-07-27 MED ORDER — SODIUM CHLORIDE 0.9 % IV BOLUS
500.0000 mL | Freq: Once | INTRAVENOUS | Status: AC
  Administered 2018-07-27: 500 mL via INTRAVENOUS

## 2018-07-27 MED ORDER — AMLODIPINE BESYLATE 10 MG PO TABS
10.0000 mg | ORAL_TABLET | Freq: Every day | ORAL | Status: DC
  Administered 2018-07-27 – 2018-07-30 (×4): 10 mg via ORAL
  Filled 2018-07-27 (×4): qty 1

## 2018-07-27 MED ORDER — DEXAMETHASONE SODIUM PHOSPHATE 4 MG/ML IJ SOLN
4.0000 mg | Freq: Once | INTRAMUSCULAR | Status: AC
  Administered 2018-07-27: 4 mg via INTRAVENOUS
  Filled 2018-07-27: qty 1

## 2018-07-27 MED ORDER — METOCLOPRAMIDE HCL 5 MG/ML IJ SOLN
10.0000 mg | Freq: Four times a day (QID) | INTRAMUSCULAR | Status: DC
  Administered 2018-07-27 – 2018-07-30 (×13): 10 mg via INTRAVENOUS
  Filled 2018-07-27 (×13): qty 2

## 2018-07-27 MED ORDER — SODIUM CHLORIDE 0.9 % IV BOLUS
1000.0000 mL | Freq: Once | INTRAVENOUS | Status: AC
  Administered 2018-07-27: 1000 mL via INTRAVENOUS

## 2018-07-27 NOTE — Progress Notes (Signed)
Progress Note for Grant [name]  Subjective: No abdominal pain, but he still suffers with nausea and vomiting.  Objective: Vital signs in last 24 hours: Temp:  [97.9 F (36.6 C)-98.5 F (36.9 C)] 98.5 F (36.9 C) (09/01 0800) Pulse Rate:  [106-132] 123 (09/01 0623) Resp:  [13-54] 32 (09/01 0623) BP: (113-224)/(85-135) 129/93 (09/01 0901) SpO2:  [92 %-100 %] 92 % (09/01 0623) Last BM Date: 07/26/18  Intake/Output from previous day: 08/31 0701 - 09/01 0700 In: 2149.5 [P.O.:353; I.V.:920.3; IV Piggyback:876.2] Out: 1200 [Urine:1200] Intake/Output this shift: No intake/output data recorded.  General appearance: alert and no distress [name]: soft, non-tender; bowel sounds normal; no masses,  no organomegaly  Lab Results: Recent Labs    07/25/18 0344 07/26/18 0331 07/27/18 0417  WBC 10.7* 13.1* 13.0*  HGB 14.9 16.4 15.5  HCT 43.3 47.0 44.5  PLT 211 260 259   BMET Recent Labs    07/26/18 0331 07/26/18 2117 07/27/18 0417  NA 141 138 141  K 3.3* 3.4* 3.8  CL 105 107 108  CO2 23 22 23   GLUCOSE 117* 285* 187*  BUN 5* 8 10  CREATININE 0.51* 0.65 0.53*  CALCIUM 9.4 9.2 9.6   LFT Recent Labs    07/27/18 0417  PROT 6.8  ALBUMIN 4.2  AST 18  ALT 18  ALKPHOS 60  BILITOT 1.1   PT/INR No results for input(s): LABPROT, INR in the last 72 hours. Hepatitis Panel No results for input(s): HEPBSAG, HCVAB, HEPAIGM, HEPBIGM in the last 72 hours. C-Diff No results for input(s): CDIFFTOX in the last 72 hours. Fecal Lactopherrin No results for input(s): FECLLACTOFRN in the last 72 hours.  Studies/Results: [place] [employer]  Result Date: 07/26/2018 If [employer] not attached, placement could not be confirmed due to current cardiac rhythm.   Medications:  Scheduled: . amLODipine  10 mg Oral Daily  . Chlorhexidine Gluconate Cloth  6 each Topical Daily  . dicyclomine  10 mg Oral TID AC & HS  . insulin aspart  0-15 Units Subcutaneous Q4H  . insulin glargine  15 Units  Subcutaneous QHS  . metoCLOPramide (REGLAN) injection  5 mg Intravenous Q8H  . metoprolol tartrate  2.5 mg Intravenous Q8H  . pantoprazole  40 mg Intravenous Q12H  . PARoxetine  10 mg Oral BID  . scopolamine  1 patch Transdermal Q72H  . sodium chloride flush  10-40 mL Intracatheter Q12H  . sucralfate  1 g Oral Q6H   Continuous: . sodium chloride 500 mL (07/27/18 0908)    Assessment/Plan: 1) Presumed gastroparesis. 2) Nausea/vomiting. 3) Baseline poorly controlled DM.   He does not have pain this morning.  There is some improvement.  The anti-emetics control his symptoms, but it is only temporary.  It may take time for his symptoms to improve, but it is likely that he will never experience complete remission of his symptoms.  Plan: 1) Continue with antiemetics. 2) Continue with Reglan. 3) Continue with intensive treatment for the history of esophagitis.  LOS: 3 days   Tajia Szeliga D 07/27/2018, 9:15 AM

## 2018-07-27 NOTE — Progress Notes (Signed)
PROGRESS NOTE    Grant [name]  GYJ:[id number] DOB: 15-Jun-1971 DOA: 07/23/2018 PCP: [name], MD   Brief Narrative:  The patient is a 47 y.o. male, with history of Hypertension, Dyslipidemia, Type 2 diabetes mellitus, Depression, Peripheral Neuropathy, chronic back pain who was recently discharged from hospital on 06/17/2018 after he was diagnosed with acute ulcerative and erosive esophagitis seen on the EGD on 06/15/2018.  Patient was discharged on Protonix twice daily, Reglan and Carafate.  Patient was seen by GI last week in the clinic for ongoing nausea vomiting.  Patient says that intermittently improved but started throwing up this morning.  He complains of more nausea.  Also having coffee-ground emesis. In the ED, lab work revealed mild DKA with anion gap of 17, bicarb 21. Patient started on DKA protocol and admitted for Intractable Nausea and Vomiting. His DKA has resolved but he remains Nauseous and has intermittent Vomiting and Hospitalization has been complicated by elevated Blood Pressure from Nausea and Vomiting along with Anxiety.   Assessment & Plan:   Active Problems:   Diabetes mellitus, insulin dependent (IDDM), uncontrolled (HCC)   Essential hypertension   Peripheral neuropathy   Chronic low back pain   Abdominal pain   DKA (diabetic ketoacidoses) (HCC)   Nausea and vomiting   IDDM (insulin dependent diabetes mellitus) ([place])   Gastroparesis   Hematemesis   Type 2 diabetes mellitus with peripheral neuropathy (HCC)   Esophagitis, [place] grade D   Long QT interval   Gastroesophageal reflux disease  Intractable nausea vomiting/hematemesis likely in the setting of Diabetic Gastroparesis, improving slowly but still remains intermittently Nauseous and has some Vomiting; Abdominal Pain has resolved -Patient has erosive/ulcerative esophagitis seen on the EGD from 06/15/2018, -GI was consulted by ED physician and they recommended supportive care for now and  continuing current plan.   -Patient was unable to tolerate antiemetics by mouth at home and was made NPO but diet has been advanced to clears and will advance as tolerated and has now been advanced to FULL Liquid Diet.  -Started Phenergan 12.5 IV every 6 hours as needed, but will Discontinue and start IV Compazine q6hprn given prolonged QTc -C/w Protonix 40 mg IV every 12 hours. -GI Consulted for further evaluation and recommending continuing high-dose acid suppression therapy along with adding Carafate -GI feels that he has some component of gastroparesis and feels like hopefully this is acute rather than chronic early gastroparesis secondary to diabetes -GI also feels there is little utility in repeating EGD this soon during hospitalization  -IV Reglan changed back to 10 mg AC and HS  -Elevate HOB -C/w Compazine 10 mg q6hprn N/V; Also added Benadryl 25 mg q8hprn for Refractory N/V -Also will try Dexamethasone 4 mg IV again Once  -Increased IV Lorazepam 1 mg IV q6hprn Nausea -Appreciate GI Evaluation and Further Recommendations; Advance Diet as tolerated to Soft Diet as patient was transitioned off of Insulin gtt early this AM -Started patient on Bentyl and Carafate and will continue  -Given another Liter of NS bolus today  -GI feels as if it may take time for his symptoms to improve and feel like he will never experience complete remission of symptoms -Given that patient keeps losing his IV access  PICC was placed  -C/w Supportive Care and follow   DKA mild, improved -Patient has mild DKA, with anion gap of 17, bicarb 21. -Started on DKA protocol and now on D5W 1/2 NS at 50 mL/hr.   -Insulin gtt transitioned  to 15 units of Lantus and Moderate Novolog q4h -Was given IV potassium as per DKA protocol.  -Recent CMP showed CO2 of 23 and AG of 13 -CBG's ranging from 99-180 -Recent HbA1c was 9.4 in July  -Continues to Have some Nausea and Vomiting but not as bad   Prolonged QTc -Patient had  prolonged QTC with QTc interval of 475 on admission will continue cardiac monitoring in stepdown.  -Was given IV Mag Sulfate -Last QTc was 447 -C/w Telemetry and Serial EKGs -Limit Use of QT Prolonging Antiemetics if possible   Accelerated Hypertension -Blood pressure were severely elevated and likely 2/2 to Nausea and Anxiety  -Changed IV Hydralazine 10 mg every 6 hours PRN to q4hprn, also low-dose Metoprolol 2.5 mg IV every 8 hours. -C/w IV Lorazepam as above -Added IV Labetalol 10 mg q2hprn for SBP >180 or DBP>100 -Resumed Home Amlodipine 5 mg po Daily and increased to 10 mg po Daily   Heart Murmur -Auscultated at the pulmonic area, no previous echo is available.  -ECHOCardiogram done and showed Mitral Trivial Regurgitation -Follow up with Cardiology as an outpatient   Hypophosphatemia -Patient's Phos was 2.0 and improved to 3.6 -Continue to Monitor and Replete as Necessary -Repeat Phos Level in AM   Leukocytosis -Likely 2/2 to Nausea and Vomiting -WBC was 15.1 and improved to 10.7 but now back up to 13.1 -> 13.0 -Continue to Monitor for S/Sx of Bleeding -Repeat CBC in AM   Hypokalemia -Patient's potassium was 3.3 and improved to 3.8 -Continue monitor and replete as necessary -Repeat CMP in the a.m.  Depression and Anxiety -Resume home Paxil 10 mg p.o. twice daily -Continue with lorazepam as above  History of Alcohol Abuse -Patient states that he relapsed in July and states that he has not had a drink since then however question whether he has been drinking and now currently withdrawing -Placed on CIWA protocol with IV lorazepam -Continue monitor very closely  DVT prophylaxis: SCDs Code Status: FULL CODE Family Communication: Discussed with wife at bedside  Disposition Plan: Anticipate D/C in the next 48 hours if improving and able to tolerate po and Cleared by Gastroenterology   Consultants:   Gastroenterology Dr. Luanna Salk Cirigliano/Dr. Benson [place]   Procedures:    ECHOCARDIOGRAM ------------------------------------------------------------------- Study Conclusions  - Left ventricle: The cavity size was normal. There was moderate   concentric hypertrophy. Systolic function was vigorous. The   estimated ejection fraction was in the range of 65% to 70%. Wall   motion was normal; there were no regional wall motion   abnormalities. The study is not technically sufficient to allow   evaluation of LV diastolic function. - Aortic valve: There was no regurgitation. - Aortic root: The aortic root was normal in size. - Mitral valve: There was trivial regurgitation. - Right ventricle: The cavity size was normal. Wall thickness was   normal. Systolic function was normal. - Right atrium: The atrium was normal in size. - Pulmonary arteries: The main pulmonary artery was normal-sized.   Systolic pressure was within the normal range. - Inferior vena cava: The vessel was normal in size. - Pericardium, extracardiac: A trivial pericardial effusion was   identified posterior to the heart. Features were not consistent   with tamponade physiology.  Antimicrobials:  Anti-infectives (From admission, onward)   None     Subjective: Patient was seen and examined at bedside and states that he he was doing well yesterday afternoon however overnight he started vomiting again and having nausea.  States that  his nausea vomiting is not as half as bad as when he came in.  Denies chest pain but does admit to having some lightheadedness and dizziness.  No abdominal pain today.  No other concerns or complaints at this time and just wants to get better and wants to try a soft diet today.  Objective: Vitals:   07/27/18 0800 07/27/18 0901 07/27/18 1300 07/27/18 1315  BP:  (!) 129/93 (!) 177/108 (!) 193/103  Pulse:   (!) 107 (!) 107  Resp:   17 14  Temp: 98.5 F (36.9 C)     TempSrc: Oral     SpO2:   100% 100%  Weight:      Height:        Intake/Output Summary (Last 24  hours) at 07/27/2018 1340 Last data filed at 07/27/2018 1000 Gross per 24 hour  Intake 1335.29 ml  Output 1300 ml  Net 35.29 ml   Filed Weights   07/23/18 2356 07/24/18 0200  Weight: 102.1 kg 102.5 kg   Examination: Physical Exam:  Constitutional: Well-nourished, well-developed obese Caucasian male who is resting with a wet rag on his head.  Was a little nauseous earlier but not right now.  Appears uncomfortable but is not in acute distress Eyes: Sclera anicteric.  Lids and conjunctive are normal ENMT: External ears and nose appear normal.  Grossly normal hearing.  Mucous members are moist Neck: Neck is supple no JVD Respiratory: Diminished to auscultation bilaterally no appreciable wheezing, rales, rhonchi.  Patient not tachypneic using accessory muscles to breathe Cardiovascular: Tachycardic rate but regular rhythm.  Has a slight murmur appreciated.  Trace lower extremity edema Abdomen: Soft, slightly tender to palpate.  Distended secondary body habitus.  Bowel sounds present all 4 quadrants GU: Deferred Musculoskeletal: No contractures cyanosis.  No joint for a noted Skin: Skin is warm and dry no appreciable rashes or lesions on limited skin evaluation Neurologic: Cranial nerves II through XII grossly intact no appreciable focal deficits.  Romberg sign and cerebellar reflexes were not assessed Psychiatric: Appears a little bit depressed but is still slightly anxious.  Intact judgment intact.  Patient is awake, alert, and oriented x3  Data Reviewed: I have personally reviewed following labs and imaging studies  CBC: Recent Labs  Lab 07/23/18 2012 07/24/18 0822 07/25/18 0344 07/26/18 0331 07/27/18 0417  WBC 13.5* 15.1* 10.7* 13.1* 13.0*  NEUTROABS  --  11.4* 6.9 7.9* 9.9*  HGB 17.2* 16.9 14.9 16.4 15.5  HCT 47.7 47.9 43.3 47.0 44.5  MCV 85.5 85.2 87.7 86.9 86.4  PLT 239 255 211 260 762   Basic Metabolic Panel: Recent Labs  Lab 07/24/18 0158  07/24/18 0822  07/24/18 1905  07/25/18 0344 07/26/18 0331 07/26/18 2117 07/27/18 0417  NA 139   < > 141   < > 140 141 141 138 141  K 3.8   < > 3.8   < > 3.6 3.8 3.3* 3.4* 3.8  CL 104   < > 106   < > 106 109 105 107 108  CO2 24   < > 24   < > 21* 25 23 22 23   GLUCOSE 278*   < > 143*   < > 191* 160* 117* 285* 187*  BUN 7   < > 6   < > 5* <5* 5* 8 10  CREATININE 0.60*   < > 0.59*   < > 0.50* 0.51* 0.51* 0.65 0.53*  CALCIUM 9.2   < > 9.3   < >  8.6* 8.7* 9.4 9.2 9.6  MG 2.3  --   --   --   --  2.1 1.9  --  1.8  PHOS  --   --  2.0*  --   --  3.5 3.5  --  3.6   < > = values in this interval not displayed.   GFR: Estimated Creatinine Clearance: 137.7 mL/min (A) (by C-G formula based on SCr of 0.53 mg/dL (L)). Liver Function Tests: Recent Labs  Lab 07/23/18 2012 07/24/18 0822 07/25/18 0344 07/26/18 0331 07/27/18 0417  AST 21 29 31 22 18   ALT 17 15 17 19 18   ALKPHOS 76 71 58 65 60  BILITOT 1.2 1.4* 1.3* 1.2 1.1  PROT 8.1 7.6 6.5 7.1 6.8  ALBUMIN 4.6 4.4 3.8 4.3 4.2   Recent Labs  Lab 07/23/18 2012  LIPASE 30   No results for input(s): AMMONIA in the last 168 hours. Coagulation Profile: No results for input(s): INR, PROTIME in the last 168 hours. Cardiac Enzymes: No results for input(s): CKTOTAL, CKMB, CKMBINDEX, TROPONINI in the last 168 hours. BNP (last 3 results) No results for input(s): PROBNP in the last 8760 hours. HbA1C: No results for input(s): HGBA1C in the last 72 hours. CBG: Recent Labs  Lab 07/26/18 2250 07/26/18 2354 07/27/18 0415 07/27/18 0828 07/27/18 1207  GLUCAP 99 126* 177* 180* 156*   Lipid Profile: No results for input(s): CHOL, HDL, LDLCALC, TRIG, CHOLHDL, LDLDIRECT in the last 72 hours. Thyroid Function Tests: No results for input(s): TSH, T4TOTAL, FREET4, T3FREE, THYROIDAB in the last 72 hours. Anemia Panel: No results for input(s): VITAMINB12, FOLATE, FERRITIN, TIBC, IRON, RETICCTPCT in the last 72 hours. Sepsis Labs: No results for input(s): PROCALCITON, LATICACIDVEN  in the last 168 hours.  Recent Results (from the past 240 hour(s))  MRSA PCR Screening     Status: None   Collection Time: 07/24/18  1:33 AM  Result Value Ref Range Status   MRSA by PCR NEGATIVE NEGATIVE Final    Comment:        The GeneXpert MRSA Assay (FDA approved for NASAL specimens only), is one component of a comprehensive MRSA colonization surveillance program. It is not intended to diagnose MRSA infection nor to guide or monitor treatment for MRSA infections. Performed at [facility], [place] [address] [place], [place] [address]     Radiology Studies: [place] Ekg Site Rite  Result Date: 07/26/2018 If [facility] image not attached, placement could not be confirmed due to current cardiac rhythm.    Scheduled Meds: . amLODipine  10 mg Oral Daily  . Chlorhexidine Gluconate Cloth  6 each Topical Daily  . dicyclomine  10 mg Oral TID AC & HS  . insulin aspart  0-15 Units Subcutaneous Q4H  . insulin glargine  15 Units Subcutaneous QHS  . metoCLOPramide (REGLAN) injection  10 mg Intravenous Q6H  . metoprolol tartrate  2.5 mg Intravenous Q8H  . pantoprazole  40 mg Intravenous Q12H  . PARoxetine  10 mg Oral BID  . scopolamine  1 patch Transdermal Q72H  . sodium chloride flush  10-40 mL Intracatheter Q12H  . sucralfate  1 g Oral Q6H   Continuous Infusions: . sodium chloride 1,000 mL (07/27/18 1312)    LOS: 3 days   Kerney Elbe, DO [facility] PAGER is on AMION  If 7PM-7AM, please contact night-coverage [email or web address] Password [email or web address] 07/27/2018, 1:40 PM

## 2018-07-28 DIAGNOSIS — E1065 Type 1 diabetes mellitus with hyperglycemia: Secondary | ICD-10-CM

## 2018-07-28 DIAGNOSIS — E1142 Type 2 diabetes mellitus with diabetic polyneuropathy: Secondary | ICD-10-CM

## 2018-07-28 DIAGNOSIS — R1084 Generalized abdominal pain: Secondary | ICD-10-CM

## 2018-07-28 DIAGNOSIS — Z87898 Personal history of other specified conditions: Secondary | ICD-10-CM

## 2018-07-28 DIAGNOSIS — K219 Gastro-esophageal reflux disease without esophagitis: Secondary | ICD-10-CM

## 2018-07-28 DIAGNOSIS — E119 Type 2 diabetes mellitus without complications: Secondary | ICD-10-CM

## 2018-07-28 DIAGNOSIS — R9431 Abnormal electrocardiogram [ECG] [EKG]: Secondary | ICD-10-CM

## 2018-07-28 DIAGNOSIS — Z794 Long term (current) use of insulin: Secondary | ICD-10-CM

## 2018-07-28 DIAGNOSIS — E876 Hypokalemia: Secondary | ICD-10-CM

## 2018-07-28 DIAGNOSIS — G63 Polyneuropathy in diseases classified elsewhere: Secondary | ICD-10-CM

## 2018-07-28 LAB — CBC WITH DIFFERENTIAL/PLATELET
BASOS PCT: 0 %
Basophils Absolute: 0 10*3/uL (ref 0.0–0.1)
EOS ABS: 0 10*3/uL (ref 0.0–0.7)
EOS PCT: 0 %
HCT: 40.5 % (ref 39.0–52.0)
HEMOGLOBIN: 14 g/dL (ref 13.0–17.0)
LYMPHS PCT: 15 %
Lymphs Abs: 1.5 10*3/uL (ref 0.7–4.0)
MCH: 30 pg (ref 26.0–34.0)
MCHC: 34.6 g/dL (ref 30.0–36.0)
MCV: 86.9 fL (ref 78.0–100.0)
Monocytes Absolute: 0.7 10*3/uL (ref 0.1–1.0)
Monocytes Relative: 7 %
NEUTROS ABS: 8.1 10*3/uL — AB (ref 1.7–7.7)
NEUTROS PCT: 78 %
PLATELETS: 207 10*3/uL (ref 150–400)
RBC: 4.66 MIL/uL (ref 4.22–5.81)
RDW: 12.2 % (ref 11.5–15.5)
WBC: 10.4 10*3/uL (ref 4.0–10.5)

## 2018-07-28 LAB — COMPREHENSIVE METABOLIC PANEL
ALK PHOS: 50 U/L (ref 38–126)
ALT: 17 U/L (ref 0–44)
ANION GAP: 6 (ref 5–15)
AST: 17 U/L (ref 15–41)
Albumin: 3.7 g/dL (ref 3.5–5.0)
BUN: 13 mg/dL (ref 6–20)
CHLORIDE: 106 mmol/L (ref 98–111)
CO2: 27 mmol/L (ref 22–32)
Calcium: 8.9 mg/dL (ref 8.9–10.3)
Creatinine, Ser: 0.49 mg/dL — ABNORMAL LOW (ref 0.61–1.24)
GFR calc non Af Amer: >60 mL/min (ref >60)
Glucose, Bld: 145 mg/dL — ABNORMAL HIGH (ref 70–99)
Potassium: 3.7 mmol/L (ref 3.5–5.1)
SODIUM: 139 mmol/L (ref 135–145)
Total Bilirubin: 0.9 mg/dL (ref 0.3–1.2)
Total Protein: 6.3 g/dL — ABNORMAL LOW (ref 6.5–8.1)

## 2018-07-28 LAB — MAGNESIUM: Magnesium: 2 mg/dL (ref 1.7–2.4)

## 2018-07-28 LAB — GLUCOSE, CAPILLARY
GLUCOSE-CAPILLARY: 125 mg/dL — AB (ref 70–99)
GLUCOSE-CAPILLARY: 102 mg/dL — AB (ref 70–99)
Glucose-Capillary: 161 mg/dL — ABNORMAL HIGH (ref 70–99)
Glucose-Capillary: 116 mg/dL — ABNORMAL HIGH (ref 70–99)
Glucose-Capillary: 170 mg/dL — ABNORMAL HIGH (ref 70–99)

## 2018-07-28 LAB — PHOSPHORUS: PHOSPHORUS: 3.4 mg/dL (ref 2.5–4.6)

## 2018-07-28 MED ORDER — SODIUM CHLORIDE 0.9 % IV BOLUS
1000.0000 mL | Freq: Once | INTRAVENOUS | Status: AC
  Administered 2018-07-28: 1000 mL via INTRAVENOUS

## 2018-07-28 MED ORDER — METOPROLOL TARTRATE 5 MG/5ML IV SOLN: 5.0000 mg | Freq: Four times a day (QID) | INTRAVENOUS | Status: DC

## 2018-07-28 MED ORDER — METOPROLOL TARTRATE 5 MG/5ML IV SOLN
5.0000 mg | Freq: Once | INTRAVENOUS | Status: AC
  Administered 2018-07-28: 5 mg via INTRAVENOUS
  Filled 2018-07-28: qty 5

## 2018-07-28 MED ORDER — LORAZEPAM 2 MG/ML IJ SOLN
0.5000 mg | Freq: Four times a day (QID) | INTRAMUSCULAR | Status: DC | PRN
  Administered 2018-07-28 – 2018-07-29 (×4): 0.5 mg via INTRAVENOUS
  Filled 2018-07-28 (×4): qty 1

## 2018-07-28 MED ORDER — METOPROLOL SUCCINATE ER 25 MG PO TB24
25.0000 mg | ORAL_TABLET | Freq: Every day | ORAL | Status: DC
  Administered 2018-07-28 – 2018-07-30 (×3): 25 mg via ORAL
  Filled 2018-07-28 (×3): qty 1

## 2018-07-28 NOTE — Care Management Note (Signed)
Case Management Note  Patient Details  Name: Grant [name] MRN: [id number] Date of Birth: May 26, 1971  Subjective/Objective:                  Subjective: Feeling better. He felt poorly most of the day yesterday, but last evening he was able to tolerate some PO.   Objective: Vital signs in last 24 hours: Temp:  [97.5 F (36.4 C)-98.5 F (36.9 C)] 97.6 F (36.4 C) (09/02 0343) Pulse Rate:  [84-116] 84 (09/02 0542) Resp:  [14-26] 14 (09/02 0542) BP: (95-193)/(21-108) 135/71 (09/02 0542) SpO2:  [96 %-100 %] 99 % (09/02 0542) Last BM Date: 07/26/18  Intake/Output from previous day: 09/01 0701 - 09/02 0700 In: 654.1 [P.O.:240; I.V.:2.3; IV Piggyback:411.8] Out: 1100 [Urine:1100] Intake/Output this shift: No intake/output data recorded.  General appearance: alert and no distress GI: soft, non-tender; bowel sounds normal; no masses,  no organomegaly  Discharge readiness is indicated by patient meeting Recovery Milestones, including ALL of the following: ? Hemodynamic stability temp-98.5,hr-119,resp.=26/bp=95/21-220/115 ? Pain absent or managed taking nausea medications/ ? Surgery not indicated  Not at this time ? Oral hydration, medications, and diet poor intake of clears only ? Ambulatory[N] yes Discharge plans and education understood not met Action/Plan: Following for progression and cm needs  Expected Discharge Date:                  Expected Discharge Plan:  Home/Self Care  In-House Referral:     Discharge planning Services  CM Consult  Post Acute Care Choice:    Choice offered to:     DME Arranged:    DME Agency:     HH Arranged:    HH Agency:     Status of Service:  In process, will continue to follow  If discussed at Long Length of Stay Meetings, dates discussed:    Additional Comments:  Leeroy Cha, RN 07/28/2018, 9:50 AM

## 2018-07-28 NOTE — Progress Notes (Signed)
PROGRESS NOTE    Grant [name]  EGB:[id number] DOB: [birth year] DOA: 07/23/2018 PCP: [name], MD   Brief Narrative:  The patient is a 47 y.o. male, with history of Hypertension, Dyslipidemia, Type 2 diabetes mellitus, Depression, Peripheral Neuropathy, chronic back pain who was recently discharged from hospital on 06/17/2018 after he was diagnosed with acute ulcerative and erosive esophagitis seen on the EGD on 06/15/2018.  Patient was discharged on Protonix twice daily, Reglan and Carafate.  Patient was seen by GI last week in the clinic for ongoing nausea vomiting.  Patient says that intermittently improved but started throwing up this morning.  He complains of more nausea.  Also having coffee-ground emesis. In the ED, lab work revealed mild DKA with anion gap of 17, bicarb 21. Patient started on DKA protocol and admitted for Intractable Nausea and Vomiting. His DKA has resolved but he remains Nauseous and has intermittent Vomiting and Hospitalization has been complicated by elevated Blood Pressure and Sinus Tachycardia from Nausea and Vomiting along with Anxiety.   Assessment & Plan:   Active Problems:   Diabetes mellitus, insulin dependent (IDDM), uncontrolled (HCC)   Essential hypertension   Peripheral neuropathy   Chronic low back pain   Abdominal pain   DKA (diabetic ketoacidoses) (HCC)   Nausea and vomiting   IDDM (insulin dependent diabetes mellitus) ([place])   Gastroparesis   Hematemesis   Type 2 diabetes mellitus with peripheral neuropathy (HCC)   Esophagitis, [place] grade D   Long QT interval   Gastroesophageal reflux disease  Intractable nausea vomiting/hematemesis likely in the setting of Diabetic Gastroparesis, improving slowly but still remains intermittently Nauseous and has some Vomiting; Abdominal Pain has resolved and so has Vomiting but remains Nauseous -Patient has erosive/ulcerative esophagitis seen on the EGD from 06/15/2018, -GI was consulted by ED  physician and they recommended supportive care for now and continuing current plan.   -Started Phenergan 12.5 IV every 6 hours as needed, but will Discontinue and start IV Compazine q6hprn given prolonged QTc -C/w Protonix 40 mg IV every 12 hours. -GI Consulted for further evaluation and recommending continuing high-dose acid suppression therapy along with adding Carafate -GI feels that he has some component of gastroparesis and feels like hopefully this is acute rather than chronic early gastroparesis secondary to diabetes; Will need Outpatient Gastric Emptying Study  -GI also feels there is little utility in repeating EGD this soon during hospitalization  -IV Reglan changed back to 10 mg AC and HS  -Elevate HOB -C/w Compazine 10 mg q6hprn N/V; Also added Benadryl 25 mg q8hprn for Refractory N/V -Tried Dexamethasone 4 mg IV two days in a row but will not repeat today given Tremors   -Decreased IV Lorazepam 1 mg IV q6hprn Nausea to 0.5 mg q6hprn -Appreciate GI Evaluation and Further Recommendations; Advanced Diet as tolerated to Soft Diet as patient was transitioned off of Insulin gtt early yesterday -Started patient on Bentyl and Carafate and will continue  -Given another Liter of NS bolus today for Sinus Tachycardia  -GI feels as if it may take time for his symptoms to improve and feel like he will never experience complete remission of symptoms -Given that patient keeps losing his IV access  PICC was placed  -C/w Supportive Care and follow   DKA mild, improved -Patient has mild DKA, with anion gap of 17, bicarb 21. -Started on DKA protocol and now on D5W 1/2 NS at 50 mL/hr.   -Insulin gtt transitioned to 15 units of  Lantus and Moderate Novolog q4h -Was given IV potassium as per DKA protocol.  -Recent CMP showed CO2 of 23 and AG of 13 -CBG's ranging from 102-242 -Recent HbA1c was 9.4 in July  -Continues to Have some Nausea but Vomiting has resolved  Prolonged QTc -Patient had prolonged  QTC with QTc interval of 475 on admission will continue cardiac monitoring in stepdown.  -Was given IV Mag Sulfate -Last QTc was 484 -C/w Telemetry and Serial EKGs -Limit Use of QT Prolonging Antiemetics if possible   Accelerated Hypertension -Blood pressure were severely elevated and likely 2/2 to Nausea and Anxiety  -Changed IV Hydralazine 10 mg every 6 hours PRN to q4hprn, also low-dose Metoprolol 2.5 mg IV every 8 hours. -C/w IV Lorazepam as above -Added IV Labetalol 10 mg q2hprn for SBP >180 or DBP>100 -Resumed Home Amlodipine 5 mg po Daily and increased to 10 mg po Daily   Heart Murmur -Auscultated at the pulmonic area, no previous echo is available.  -ECHOCardiogram done and showed Mitral Trivial Regurgitation -Follow up with Cardiology as an outpatient   Hypophosphatemia -Patient's Phos was 2.0 and improved to 3.4 -Continue to Monitor and Replete as Necessary -Repeat Phos Level in AM   Leukocytosis -Likely 2/2 to Nausea and Vomiting -WBC was 15.1 and improved to 10.7 but now back up to 13.1 -> 13.0 -> 10.4 -Continue to Monitor for S/Sx of Bleeding -Repeat CBC in AM   Hypokalemia -Patient's potassium was 3.3 and improved to 3.7 -Continue monitor and replete as necessary -Repeat CMP in the a.m.  Depression and Anxiety -Resume home Paxil 10 mg p.o. twice daily -Continue with lorazepam as above at deceased Dose  History of Alcohol Abuse -Patient states that he relapsed in July and states that he has not had a drink since then however question whether he has been drinking and now currently withdrawing because of Tremors; Tremors likley from Steroids  -Placed on CIWA protocol with IV lorazepam for now and will D/C tomorrow -Continue monitor very closely  Sinus Tachycardia -Physiological Stress Response to Nausea/Vomiting/Anxiety -Will Continue to Monitor on Telemetry -Given another Fluid Bolus 1 Liter today -Given IV Metoprolol 5 mg once and will start it at 5 mg  q6hprn for HR>130 -Resume Home Metoprolol XL 25 mg po Daily dose  DVT prophylaxis: SCDs Code Status: FULL CODE Family Communication: No family present at bedside  Disposition Plan: Anticipate D/C in the next 48 hours if improving and able to tolerate po and Cleared by Gastroenterology   Consultants:   Gastroenterology Dr. Luanna Salk Cirigliano/Dr. Benson Norway who is cross covering   Procedures:  ECHOCARDIOGRAM ------------------------------------------------------------------- Study Conclusions  - Left ventricle: The cavity size was normal. There was moderate   concentric hypertrophy. Systolic function was vigorous. The   estimated ejection fraction was in the range of 65% to 70%. Wall   motion was normal; there were no regional wall motion   abnormalities. The study is not technically sufficient to allow   evaluation of LV diastolic function. - Aortic valve: There was no regurgitation. - Aortic root: The aortic root was normal in size. - Mitral valve: There was trivial regurgitation. - Right ventricle: The cavity size was normal. Wall thickness was   normal. Systolic function was normal. - Right atrium: The atrium was normal in size. - Pulmonary arteries: The main pulmonary artery was normal-sized.   Systolic pressure was within the normal range. - Inferior vena cava: The vessel was normal in size. - Pericardium, extracardiac: A trivial pericardial effusion  was   identified posterior to the heart. Features were not consistent   with tamponade physiology.  Antimicrobials:  Anti-infectives (From admission, onward)   None     Subjective: Patient was seen and examined at bedside states that he is feeling "terrible."  States he has not had any more vomiting but has had significant nausea.  Denies chest pain or shortness of breath but does have some mild lightheadedness and dizziness.  States he is able to tolerate some of his dinner last night and ate some chicken and had some mild  vomiting overnight but none today.  No other concerns or complaints at this time and states he feels "worn out."  Objective: Vitals:   07/28/18 0542 07/28/18 0734 07/28/18 0800 07/28/18 1149  BP: 135/71  (!) 220/115   Pulse: 84  (!) 119   Resp: 14  16   Temp:  97.7 F (36.5 C)  98.1 F (36.7 C)  TempSrc:  Oral  Oral  SpO2: 99%  97%   Weight:      Height:        Intake/Output Summary (Last 24 hours) at 07/28/2018 1359 Last data filed at 07/28/2018 0600 Gross per 24 hour  Intake 240 ml  Output 700 ml  Net -460 ml   Filed Weights   07/23/18 2356 07/24/18 0200  Weight: 102.1 kg 102.5 kg   Examination: Physical Exam:  Constitutional: Well-nourished, well-developed obese Caucasian male who is currently in some mild distress laying in bed and feels "bad". Eyes: Sclera anicteric.  Lids and conjunctive are ENMT: External ears and nose appear normal.  Grossly normal hearing Neck: Neck appears supple with no JVD Respiratory: Diminished to auscultation bilaterally no appreciable wheezing, rales, rhonchi.  Patient was not tachypneic using accessory muscle breathe Cardiovascular: Cardiac rate but regular rhythm.  Has a slight murmur appreciated.  Has very mild lower extremity edema Abdomen: Soft, slightly tender to palpate.  Distended secondary body habitus.  Bowel sounds present all 4 quadrants GU: Deferred Musculoskeletal: No contractures or cyanosis.  No joint deformities noted Skin: Skin is warm and dry no appreciable rashes or lesions limited skin evaluation Neurologic: Cranial nerves II through XII grossly intact no appreciable focal deficits. Psychiatric: Appears anxious and a bit depressed.  Intact judgment insight.  Patient is awake, alert, and oriented x3  Data Reviewed: I have personally reviewed following labs and imaging studies  CBC: Recent Labs  Lab 07/24/18 0822 07/25/18 0344 07/26/18 0331 07/27/18 0417 07/28/18 0347  WBC 15.1* 10.7* 13.1* 13.0* 10.4  NEUTROABS  11.4* 6.9 7.9* 9.9* 8.1*  HGB 16.9 14.9 16.4 15.5 14.0  HCT 47.9 43.3 47.0 44.5 40.5  MCV 85.2 87.7 86.9 86.4 86.9  PLT 255 211 260 259 989   Basic Metabolic Panel: Recent Labs  Lab 07/24/18 0158  07/24/18 0822  07/25/18 0344 07/26/18 0331 07/26/18 2117 07/27/18 0417 07/28/18 0347  NA 139   < > 141   < > 141 141 138 141 139  K 3.8   < > 3.8   < > 3.8 3.3* 3.4* 3.8 3.7  CL 104   < > 106   < > 109 105 107 108 106  CO2 24   < > 24   < > 25 23 22 23 27   GLUCOSE 278*   < > 143*   < > 160* 117* 285* 187* 145*  BUN 7   < > 6   < > <5* 5* 8 10 13   CREATININE 0.60*   < >  0.59*   < > 0.51* 0.51* 0.65 0.53* 0.49*  CALCIUM 9.2   < > 9.3   < > 8.7* 9.4 9.2 9.6 8.9  MG 2.3  --   --   --  2.1 1.9  --  1.8 2.0  PHOS  --   --  2.0*  --  3.5 3.5  --  3.6 3.4   < > = values in this interval not displayed.   GFR: Estimated Creatinine Clearance: 137.7 mL/min (A) (by C-G formula based on SCr of 0.49 mg/dL (L)). Liver Function Tests: Recent Labs  Lab 07/24/18 0822 07/25/18 0344 07/26/18 0331 07/27/18 0417 07/28/18 0347  AST 29 31 22 18 17   ALT 15 17 19 18 17   ALKPHOS 71 58 65 60 50  BILITOT 1.4* 1.3* 1.2 1.1 0.9  PROT 7.6 6.5 7.1 6.8 6.3*  ALBUMIN 4.4 3.8 4.3 4.2 3.7   Recent Labs  Lab 07/23/18 2012  LIPASE 30   No results for input(s): AMMONIA in the last 168 hours. Coagulation Profile: No results for input(s): INR, PROTIME in the last 168 hours. Cardiac Enzymes: No results for input(s): CKTOTAL, CKMB, CKMBINDEX, TROPONINI in the last 168 hours. BNP (last 3 results) No results for input(s): PROBNP in the last 8760 hours. HbA1C: No results for input(s): HGBA1C in the last 72 hours. CBG: Recent Labs  Lab 07/27/18 1955 07/27/18 2351 07/28/18 0319 07/28/18 0718 07/28/18 1104  GLUCAP 242* 208* 125* 102* 161*   Lipid Profile: No results for input(s): CHOL, HDL, LDLCALC, TRIG, CHOLHDL, LDLDIRECT in the last 72 hours. Thyroid Function Tests: No results for input(s): TSH,  T4TOTAL, FREET4, T3FREE, THYROIDAB in the last 72 hours. Anemia Panel: No results for input(s): VITAMINB12, FOLATE, FERRITIN, TIBC, IRON, RETICCTPCT in the last 72 hours. Sepsis Labs: No results for input(s): PROCALCITON, LATICACIDVEN in the last 168 hours.  Recent Results (from the past 240 hour(s))  MRSA PCR Screening     Status: None   Collection Time: 07/24/18  1:33 AM  Result Value Ref Range Status   MRSA by PCR NEGATIVE NEGATIVE Final    Comment:        The GeneXpert MRSA Assay (FDA approved for NASAL specimens only), is one component of a comprehensive MRSA colonization surveillance program. It is not intended to diagnose MRSA infection nor to guide or monitor treatment for MRSA infections. Performed at [facility], [place] [address], [place], [place] [address]     Radiology Studies: No results found.   Scheduled Meds: . amLODipine  10 mg Oral Daily  . Chlorhexidine Gluconate Cloth  6 each Topical Daily  . dicyclomine  10 mg Oral TID AC & HS  . insulin aspart  0-15 Units Subcutaneous Q4H  . insulin glargine  15 Units Subcutaneous QHS  . metoCLOPramide (REGLAN) injection  10 mg Intravenous Q6H  . metoprolol succinate  25 mg Oral Daily  . pantoprazole  40 mg Intravenous Q12H  . PARoxetine  10 mg Oral BID  . scopolamine  1 patch Transdermal Q72H  . sodium chloride flush  10-40 mL Intracatheter Q12H  . sucralfate  1 g Oral Q6H   Continuous Infusions:   LOS: 4 days   Kerney Elbe, DO [facility] PAGER is on AMION  If 7PM-7AM, please contact night-coverage [email or web address] Password [email or web address] 07/28/2018, 1:59 PM

## 2018-07-28 NOTE — Progress Notes (Signed)
Subjective: Feeling better.  He felt poorly most of the day yesterday, but last evening he was able to tolerate some PO.    Objective: Vital signs in last 24 hours: Temp:  [97.5 F (36.4 C)-98.5 F (36.9 C)] 97.6 F (36.4 C) (09/02 0343) Pulse Rate:  [84-116] 84 (09/02 0542) Resp:  [14-26] 14 (09/02 0542) BP: (95-193)/(21-108) 135/71 (09/02 0542) SpO2:  [96 %-100 %] 99 % (09/02 0542) Last BM Date: 07/26/18  Intake/Output from previous day: 09/01 0701 - 09/02 0700 In: 654.1 [P.O.:240; I.V.:2.3; IV Piggyback:411.8] Out: 1100 [Urine:1100] Intake/Output this shift: No intake/output data recorded.  General appearance: alert and no distress GI: soft, non-tender; bowel sounds normal; no masses,  no organomegaly  Lab Results: Recent Labs    07/26/18 0331 07/27/18 0417 07/28/18 0347  WBC 13.1* 13.0* 10.4  HGB 16.4 15.5 14.0  HCT 47.0 44.5 40.5  PLT 260 259 207   BMET Recent Labs    07/26/18 2117 07/27/18 0417 07/28/18 0347  NA 138 141 139  K 3.4* 3.8 3.7  CL 107 108 106  CO2 22 23 27   GLUCOSE 285* 187* 145*  BUN 8 10 13   CREATININE 0.65 0.53* 0.49*  CALCIUM 9.2 9.6 8.9   LFT Recent Labs    07/28/18 0347  PROT 6.3*  ALBUMIN 3.7  AST 17  ALT 17  ALKPHOS 50  BILITOT 0.9   PT/INR No results for input(s): LABPROT, INR in the last 72 hours. Hepatitis Panel No results for input(s): HEPBSAG, HCVAB, HEPAIGM, HEPBIGM in the last 72 hours. C-Diff No results for input(s): CDIFFTOX in the last 72 hours. Fecal Lactopherrin No results for input(s): FECLLACTOFRN in the last 72 hours.  Studies/Results: [employer]  Result Date: 07/26/2018 If [employer] not attached, placement could not be confirmed due to current cardiac rhythm.   Medications:  Scheduled: . amLODipine  10 mg Oral Daily  . Chlorhexidine Gluconate Cloth  6 each Topical Daily  . dicyclomine  10 mg Oral TID AC & HS  . insulin aspart  0-15 Units Subcutaneous Q4H  . insulin glargine  15  Units Subcutaneous QHS  . metoCLOPramide (REGLAN) injection  10 mg Intravenous Q6H  . metoprolol tartrate  2.5 mg Intravenous Q8H  . pantoprazole  40 mg Intravenous Q12H  . PARoxetine  10 mg Oral BID  . scopolamine  1 patch Transdermal Q72H  . sodium chloride flush  10-40 mL Intracatheter Q12H  . sucralfate  1 g Oral Q6H   Continuous:   Assessment/Plan: 1) Nausea /Vomiting - improving. 2) [place] Grade D esophagitis. 3) Presumed gastroparesis. 4) History of uncontrolled DM.              The patient continues to improve daily.  The hope is that he will be able to resolve the nausea and vomiting or have the symptoms reduced to a minimum.  DKA can cause an acute gastroparesis.  There was no formal testing for gastroparesis and he will need the GES when he is not acutely ill.  This can be performed as an outpatient with [facility].  Plan: 1) Continue with the current level of supportive care. 2) [facility] to resume care in the AM.  LOS: 4 days   Yamilee Harmes D 07/28/2018, 7:26 AM

## 2018-07-29 DIAGNOSIS — E1169 Type 2 diabetes mellitus with other specified complication: Secondary | ICD-10-CM

## 2018-07-29 DIAGNOSIS — K319 Disease of stomach and duodenum, unspecified: Secondary | ICD-10-CM

## 2018-07-29 LAB — BASIC METABOLIC PANEL
Anion gap: 7 (ref 5–15)
BUN: 13 mg/dL (ref 6–20)
CALCIUM: 8.7 mg/dL — AB (ref 8.9–10.3)
CO2: 28 mmol/L (ref 22–32)
CREATININE: 0.57 mg/dL — AB (ref 0.61–1.24)
Chloride: 104 mmol/L (ref 98–111)
GFR calc Af Amer: >60 mL/min (ref >60)
Glucose, Bld: 96 mg/dL (ref 70–99)
POTASSIUM: 3.3 mmol/L — AB (ref 3.5–5.1)
SODIUM: 139 mmol/L (ref 135–145)

## 2018-07-29 LAB — GLUCOSE, CAPILLARY
GLUCOSE-CAPILLARY: 119 mg/dL — AB (ref 70–99)
GLUCOSE-CAPILLARY: 122 mg/dL — AB (ref 70–99)
GLUCOSE-CAPILLARY: 143 mg/dL — AB (ref 70–99)
GLUCOSE-CAPILLARY: 151 mg/dL — AB (ref 70–99)
GLUCOSE-CAPILLARY: 81 mg/dL (ref 70–99)
GLUCOSE-CAPILLARY: 91 mg/dL (ref 70–99)
Glucose-Capillary: 162 mg/dL — ABNORMAL HIGH (ref 70–99)
Glucose-Capillary: 126 mg/dL — ABNORMAL HIGH (ref 70–99)

## 2018-07-29 MED ORDER — POTASSIUM CHLORIDE 10 MEQ/100ML IV SOLN
10.0000 meq | INTRAVENOUS | Status: AC
  Administered 2018-07-29 (×2): 10 meq via INTRAVENOUS
  Filled 2018-07-29 (×3): qty 100

## 2018-07-29 MED ORDER — HYDROCODONE-ACETAMINOPHEN 5-325 MG PO TABS
1.0000 | ORAL_TABLET | ORAL | Status: DC | PRN
  Administered 2018-07-29 (×2): 1 via ORAL
  Filled 2018-07-29 (×2): qty 1

## 2018-07-29 MED ORDER — TRAMADOL HCL 50 MG PO TABS
50.0000 mg | ORAL_TABLET | Freq: Four times a day (QID) | ORAL | Status: DC | PRN
  Administered 2018-07-29: 50 mg via ORAL
  Filled 2018-07-29: qty 1

## 2018-07-29 MED ORDER — HYDROXYZINE HCL 25 MG PO TABS
25.0000 mg | ORAL_TABLET | Freq: Every evening | ORAL | Status: DC | PRN
  Administered 2018-07-29: 25 mg via ORAL
  Filled 2018-07-29: qty 1

## 2018-07-29 NOTE — Care Management Note (Signed)
Case Management Note  Patient Details  Name: Grant [name] MRN: [id number] Date of Birth: 23-Nov-1971  Subjective/Objective:                  Assessment & Plan:   Active Problems:   Diabetes mellitus, insulin dependent (IDDM), uncontrolled (HCC)   Essential hypertension   Peripheral neuropathy   Chronic low back pain   Abdominal pain   DKA (diabetic ketoacidoses) (HCC)   Nausea and vomiting   IDDM (insulin dependent diabetes mellitus) ([place])   Gastroparesis   Hematemesis   Type 2 diabetes mellitus with peripheral neuropathy (HCC)   Esophagitis, [place] grade D   Long QT interval   Gastroesophageal reflux disease  Intractable nausea vomiting/hematemesis likely in the setting of Diabetic Gastroparesis, improving slowly but still remains intermittently Nauseous and has some Vomiting; Abdominal Pain has resolved and so has Vomiting but remains Nauseous -Patient has erosive/ulcerative esophagitis seen on the EGD from 06/15/2018, -GI was consulted by ED physician and they recommended supportive care for now and continuing current plan.  -Started Phenergan 12.5 IV every 6 hours as needed, but will Discontinue and start IV Compazine q6hprn given prolonged QTc -C/w Protonix 40 mg IV every 12 hours. -GI Consulted for further evaluation and recommending continuing high-dose acid suppression therapy along with adding Carafate -GI feels that he has some component of gastroparesis and feels like hopefully this is acute rather than chronic early gastroparesis secondary to diabetes; Will need Outpatient Gastric Emptying Study  -GI also feels there is little utility in repeating EGD this soon during hospitalization  -IV Reglan changed back to 10 mg AC and HS  -Elevate HOB -C/w Compazine 10 mg q6hprn N/V; Also added Benadryl 25 mg q8hprn for Refractory N/V -Tried Dexamethasone 4 mg IV two days in a row but will not repeat today given Tremors   -Decreased IV Lorazepam 1 mg IV q6hprn Nausea to  0.5 mg q6hprn -Appreciate GI Evaluation and Further Recommendations; Advanced Diet as tolerated to Soft Diet as patient was transitioned off of Insulin gtt early yesterday -Started patient on Bentyl and Carafate and will continue  -Given another Liter of NS bolus today for Sinus Tachycardia  -GI feels as if it may take time for his symptoms to improve and feel like he will never experience complete remission of symptoms -Given that patient keeps losing his IV access  PICC was placed  -C/w Supportive Care and follow   DKA mild, improved -Patient has mild DKA, with anion gap of 17, bicarb 21. -Started on DKA protocol and now on D5W 1/2 NS at 50 mL/hr.  -Insulin gtt transitioned to 15 units of Lantus and Moderate Novolog q4h -Was given IV potassium as per DKA protocol. -Recent CMP showed CO2 of 23 and AG of 13 -CBG's ranging from 102-242 -Recent HbA1c was 9.4 in July  -Continues to Have some Nausea but Vomiting has resolved  Prolonged QTc -Patient had prolonged QTC with QTc interval of 475 on admission will continue cardiac monitoring in stepdown.  -Was given IV Mag Sulfate -Last QTc was 484 -C/w Telemetry and Serial EKGs -Limit Use of QT Prolonging Antiemetics if possible   Accelerated Hypertension -Blood pressure were severely elevated and likely 2/2 to Nausea and Anxiety  -Changed IV Hydralazine 10 mg every 6 hours PRN to q4hprn, also low-dose Metoprolol 2.5 mg IV every 8 hours. -C/w IV Lorazepam as above -Added IV Labetalol 10 mg q2hprn for SBP >180 or DBP>100 -Resumed Home Amlodipine 5 mg  po Daily and increased to 10 mg po Daily   Heart Murmur -Auscultated at the pulmonic area, no previous echo is available.  -ECHOCardiogram done and showed Mitral Trivial Regurgitation -Follow up with Cardiology as an outpatient   Hypophosphatemia -Patient's Phos was 2.0 and improved to 3.4 -Continue to Monitor and Replete as Necessary -Repeat Phos Level in AM    Leukocytosis -Likely 2/2 to Nausea and Vomiting -WBC was 15.1 and improved to 10.7 but now back up to 13.1 -> 13.0 -> 10.4 -Continue to Monitor for S/Sx of Bleeding -Repeat CBC in AM   Hypokalemia -Patient's potassium was 3.3 and improved to 3.7 -Continue monitor and replete as necessary -Repeat CMP in the a.m.  Depression and Anxiety -Resume home Paxil 10 mg p.o. twice daily -Continue with lorazepam as above at deceased Dose  History of Alcohol Abuse -Patient states that he relapsed in July and states that he has not had a drink since then however question whether he has been drinking and now currently withdrawing because of Tremors; Tremors likley from Steroids  -Placed on CIWA protocol with IV lorazepam for now and will D/C tomorrow -Continue monitor very closely  Sinus Tachycardia -Physiological Stress Response to Nausea/Vomiting/Anxiety -Will Continue to Monitor on Telemetry -Given another Fluid Bolus 1 Liter today -Given IV Metoprolol 5 mg once and will start it at 5 mg q6hprn for HR>130 -Resume Home Metoprolol XL 25 mg po Daily dose  DVT prophylaxis: SCDs Code Status: FULL CODE Family Communication: No family present at bedside  Disposition Plan: Anticipate D/C in the next 48 hours if improving and able to tolerate po and Cleared by Gastroenterology   Action/Plan: Will follow to see if dcd today [id number]  Expected Discharge Date:                  Expected Discharge Plan:  Home/Self Care  In-House Referral:     Discharge planning Services  CM Consult  Post Acute Care Choice:    Choice offered to:     DME Arranged:    DME Agency:     HH Arranged:    HH Agency:     Status of Service:  In process, will continue to follow  If discussed at Long Length of Stay Meetings, dates discussed:    Additional Comments:  Leeroy Cha, RN 07/29/2018, 9:51 AM

## 2018-07-29 NOTE — Progress Notes (Signed)
CSW consult-Medication Concerns. CSW informed the RN Case Manager.  Kathrin Greathouse, Marlinda Mike, MSW Clinical Social Worker  [phone] 07/29/2018  12:34 PM

## 2018-07-29 NOTE — Progress Notes (Signed)
PROGRESS NOTE    PEIRCE DEVENEY  CLE:[id number] DOB: September 25, 1971 DOA: 07/23/2018 PCP: [name], MD    Brief Narrative:  47 year old with past medical history relevant for hypertension, hyperlipidemia, type 2 diabetes on insulin, depression, peripheral neuropathy who was recently diagnosed with erosive gastritis by EGD on 06/15/2018 who presents with uncontrolled nausea and vomiting concerning for acute gastroparesis and DKA.   Assessment & Plan:   Active Problems:   Diabetes mellitus, insulin dependent (IDDM), uncontrolled (HCC)   Essential hypertension   Peripheral neuropathy   Chronic low back pain   Abdominal pain   DKA (diabetic ketoacidoses) (HCC)   Nausea and vomiting   IDDM (insulin dependent diabetes mellitus) ([place])   Gastroparesis   Hematemesis   Type 2 diabetes mellitus with peripheral neuropathy (HCC)   Esophagitis, [place] grade D   Long QT interval   Gastroesophageal reflux disease   #) Acute gastroparesis: Thought to be secondary to underlying chronic gastroparesis superimposed on DKA.  He has been very slow to improve however he is gradually improving on current treatment. -GI following, appreciate recommendations -Continue dicyclomine and hyoscyamine -Continue PRN lorazepam for nausea  -continue prochlorperazine 10 mg every 6 hours as needed for nausea -Continue scopolamine patch every 72 hours -Continue carb restricted  #) Type 2 diabetes complicated by DKA: DKA is resolved with IV insulin. - Hold home NPH 30 units every morning and 40 units nightly and regular insulin with meals -Continue sliding scale insulin -Carb restricted diet -Continue glargine 15 units nightly  #) Erosive/ulcerative esophagitis: Thought to be secondary to reflux.  Patient did have EGD on 06/15/2018. -Follow-up biopsies -Continue twice daily PPI -Continue sucralfate  #) Hypertension: -Continue metoprolol succinate 25 mg daily -Continue amlodipine 10 mg daily  #)  Pain/psych: -Continue paroxetine 10 mg twice daily -Continue hydroxyzine 25 mg nightly as needed  Fluids: Tolerating p.o. Electrolytes: Monitor and supplement Nutrition: Carb restricted diet  Prophylaxis: Ambulatory  Disposition: Pending tolerating p.o.  Full code   Consultants:   [facility]  Procedures:  Echo 07/24/2018: - Left ventricle: The cavity size was normal. There was moderate   concentric hypertrophy. Systolic function was vigorous. The   estimated ejection fraction was in the range of 65% to 70%. Wall   motion was normal; there were no regional wall motion   abnormalities. The study is not technically sufficient to allow   evaluation of LV diastolic function. - Aortic valve: There was no regurgitation. - Aortic root: The aortic root was normal in size. - Mitral valve: There was trivial regurgitation. - Right ventricle: The cavity size was normal. Wall thickness was   normal. Systolic function was normal. - Right atrium: The atrium was normal in size. - Pulmonary arteries: The main pulmonary artery was normal-sized.   Systolic pressure was within the normal range. - Inferior vena cava: The vessel was normal in size. - Pericardium, extracardiac: A trivial pericardial effusion was   identified posterior to the heart. Features were not consistent    with tamponade physiology.  Antimicrobials:   None   Subjective: Patient reports he is feeling better however he is still only able to take small sips.  He reports he has not tolerated a regular diet yet.  He does report nausea but denies any cough, congestion, rhinorrhea.  Objective: Vitals:   07/29/18 0000 07/29/18 0040 07/29/18 0400 07/29/18 0800  BP:  113/70    Pulse:  71    Resp:  17    Temp: (!)  97.5 F (36.4 C)  97.6 F (36.4 C) (!) 97.4 F (36.3 C)  TempSrc: Oral  Oral Oral  SpO2:  97%    Weight:      Height:        Intake/Output Summary (Last 24 hours) at 07/29/2018 1003 Last data filed at  07/29/2018 0700 Gross per 24 hour  Intake 1240 ml  Output 1175 ml  Net 65 ml   Filed Weights   07/23/18 2356 07/24/18 0200  Weight: 102.1 kg 102.5 kg    Examination:  General exam: Appears calm and comfortable  Respiratory system: Clear to auscultation. Respiratory effort normal. Cardiovascular system: Regular rate and rhythm, 2 out of 6 systolic murmur heard best at left upper sternal border Gastrointestinal system: Abdomen is nondistended, soft and nontender. No organomegaly or masses felt. Normal bowel sounds heard. Central nervous system: Alert and oriented. No focal neurological deficits. Extremities: No lower extremity edema Skin: No rashes over visible skin Psychiatry: Judgement and insight appear normal. Mood & affect appropriate.     Data Reviewed: I have personally reviewed following labs and imaging studies  CBC: Recent Labs  Lab 07/24/18 0822 07/25/18 0344 07/26/18 0331 07/27/18 0417 07/28/18 0347  WBC 15.1* 10.7* 13.1* 13.0* 10.4  NEUTROABS 11.4* 6.9 7.9* 9.9* 8.1*  HGB 16.9 14.9 16.4 15.5 14.0  HCT 47.9 43.3 47.0 44.5 40.5  MCV 85.2 87.7 86.9 86.4 86.9  PLT 255 211 260 259 409   Basic Metabolic Panel: Recent Labs  Lab 07/24/18 0158  07/24/18 0822  07/25/18 0344 07/26/18 0331 07/26/18 2117 07/27/18 0417 07/28/18 0347 07/29/18 0626  NA 139   < > 141   < > 141 141 138 141 139 139  K 3.8   < > 3.8   < > 3.8 3.3* 3.4* 3.8 3.7 3.3*  CL 104   < > 106   < > 109 105 107 108 106 104  CO2 24   < > 24   < > 25 23 22 23 27 28   GLUCOSE 278*   < > 143*   < > 160* 117* 285* 187* 145* 96  BUN 7   < > 6   < > <5* 5* 8 10 13 13   CREATININE 0.60*   < > 0.59*   < > 0.51* 0.51* 0.65 0.53* 0.49* 0.57*  CALCIUM 9.2   < > 9.3   < > 8.7* 9.4 9.2 9.6 8.9 8.7*  MG 2.3  --   --   --  2.1 1.9  --  1.8 2.0  --   PHOS  --   --  2.0*  --  3.5 3.5  --  3.6 3.4  --    < > = values in this interval not displayed.   GFR: Estimated Creatinine Clearance: 137.7 mL/min (A) (by C-G  formula based on SCr of 0.57 mg/dL (L)). Liver Function Tests: Recent Labs  Lab 07/24/18 0822 07/25/18 0344 07/26/18 0331 07/27/18 0417 07/28/18 0347  AST 29 31 22 18 17   ALT 15 17 19 18 17   ALKPHOS 71 58 65 60 50  BILITOT 1.4* 1.3* 1.2 1.1 0.9  PROT 7.6 6.5 7.1 6.8 6.3*  ALBUMIN 4.4 3.8 4.3 4.2 3.7   Recent Labs  Lab 07/23/18 2012  LIPASE 30   No results for input(s): AMMONIA in the last 168 hours. Coagulation Profile: No results for input(s): INR, PROTIME in the last 168 hours. Cardiac Enzymes: No results for input(s): CKTOTAL, CKMB, CKMBINDEX, TROPONINI in  the last 168 hours. BNP (last 3 results) No results for input(s): PROBNP in the last 8760 hours. HbA1C: No results for input(s): HGBA1C in the last 72 hours. CBG: Recent Labs  Lab 07/28/18 1539 07/28/18 2009 07/29/18 0038 07/29/18 0435 07/29/18 0815  GLUCAP 116* 170* 122* 91 81   Lipid Profile: No results for input(s): CHOL, HDL, LDLCALC, TRIG, CHOLHDL, LDLDIRECT in the last 72 hours. Thyroid Function Tests: No results for input(s): TSH, T4TOTAL, FREET4, T3FREE, THYROIDAB in the last 72 hours. Anemia Panel: No results for input(s): VITAMINB12, FOLATE, FERRITIN, TIBC, IRON, RETICCTPCT in the last 72 hours. Sepsis Labs: No results for input(s): PROCALCITON, LATICACIDVEN in the last 168 hours.  Recent Results (from the past 240 hour(s))  MRSA PCR Screening     Status: None   Collection Time: 07/24/18  1:33 AM  Result Value Ref Range Status   MRSA by PCR NEGATIVE NEGATIVE Final    Comment:        The GeneXpert MRSA Assay (FDA approved for NASAL specimens only), is one component of a comprehensive MRSA colonization surveillance program. It is not intended to diagnose MRSA infection nor to guide or monitor treatment for MRSA infections. Performed at [facility], [place] [address] [place], [place] [address]          Radiology Studies: No results found.      Scheduled  Meds: . amLODipine  10 mg Oral Daily  . Chlorhexidine Gluconate Cloth  6 each Topical Daily  . dicyclomine  10 mg Oral TID AC & HS  . insulin aspart  0-15 Units Subcutaneous Q4H  . insulin glargine  15 Units Subcutaneous QHS  . metoCLOPramide (REGLAN) injection  10 mg Intravenous Q6H  . metoprolol succinate  25 mg Oral Daily  . pantoprazole  40 mg Intravenous Q12H  . PARoxetine  10 mg Oral BID  . scopolamine  1 patch Transdermal Q72H  . sodium chloride flush  10-40 mL Intracatheter Q12H  . sucralfate  1 g Oral Q6H   Continuous Infusions: . potassium chloride 10 mEq (07/29/18 0955)     LOS: 5 days    Time spent: [place], MD [facility]  If 7PM-7AM, please contact night-coverage [email or web address] Password [email or web address] 07/29/2018, 10:03 AM

## 2018-07-29 NOTE — Progress Notes (Addendum)
Progress Note   Subjective  Chief Complaint: Nausea and vomiting, [place] grade D esophagitis, presumed gastroparesis  Today, reports that he is feeling much better, actually woke up this morning not feeling nauseous no further episodes of vomiting, tolerating "smaller meals" per him.  Does ask many questions about the plan going forward.  No new complaints today.   Review of systems: No chest pain No dyspnea No dysuria   Objective   Vital signs in last 24 hours: Temp:  [97.4 F (36.3 C)-98.2 F (36.8 C)] 97.4 F (36.3 C) (09/03 0800) Pulse Rate:  [71-110] 71 (09/03 0040) Resp:  [16-17] 17 (09/03 0040) BP: (103-128)/(70-86) 113/70 (09/03 0040) SpO2:  [97 %-98 %] 97 % (09/03 0040) Last BM Date: 07/28/18(per patient) General:    White male in NAD Heart:  Regular rate and rhythm; no murmurs Lungs: Respirations even and unlabored, lungs CTA bilaterally Abdomen:  Soft, nontender and nondistended. Normal bowel sounds. Extremities:  Without edema. Neurologic:  Alert and oriented,  grossly normal neurologically. Psych:  Cooperative. Normal mood and affect.  Intake/Output from previous day: 09/02 0701 - 09/03 0700 In: 1240 [P.O.:240; IV Piggyback:1000] Out: 1175 [Urine:1175]  Lab Results: Recent Labs    07/27/18 0417 07/28/18 0347  WBC 13.0* 10.4  HGB 15.5 14.0  HCT 44.5 40.5  PLT 259 207   BMET Recent Labs    07/27/18 0417 07/28/18 0347 07/29/18 0626  NA 141 139 139  K 3.8 3.7 3.3*  CL 108 106 104  CO2 23 27 28   GLUCOSE 187* 145* 96  BUN 10 13 13   CREATININE 0.53* 0.49* 0.57*  CALCIUM 9.6 8.9 8.7*   LFT Recent Labs    07/28/18 0347  PROT 6.3*  ALBUMIN 3.7  AST 17  ALT 17  ALKPHOS 50  BILITOT 0.9    Assessment / Plan:   Assessment: 1.  Nausea and vomiting: Presumed gastroparesis from uncontrolled diabetes, currently improving 2.  Uncontrolled diabetes  Plan: 1.  Would ideally shoot for a Reglan schedule every 8 hours before meals with Zofran 4 mg  for breakthrough symptoms in between.  Patient would likely benefit from being discharged with 4-6 Compazine suppositories as well for severe breakthrough. 2.  Long discussion had with patient about diabetic control. 3.  Continue current supportive measures and medications as prescribed for now. 4.  If patient continues to improve he may be discharged home tomorrow. 5.  Placed a social work consult so they may discuss financial concerns of the patient going forward in regards to medications once he is discharged and paying for his current hospital stay 6.  Please await any further recommendations from Dr. Loletha Carrow.  Thank you for your kind consultation, we will continue to follow along.   LOS: 5 days   Levin Erp  07/29/2018, 9:54 AM  I have discussed the case with the PA, and that is the plan I formulated. I personally interviewed and examined the patient.  He is improved overall on current regimen.  What he needs more than anything else is better compliance with his insulin therapy and control of glucose.  He admits to forgetting insulin dosing, especially if he eats away from home.  It also seems doubtful that he follows the proper diet recommendations for optimal glucose control.  He seems to have diabetic gastropathy, which is often a combination of gastroparesis and gastric dysrhythmia.  Optimal glucose control is of paramount importance, followed by management with antiemetics such as metoclopramide, ondansetron, and  rescue dosing of Compazine suppositories to hopefully abort a severe episode that resulted in hospitalization.  He needs social work for issues noted above, diabetes education, and a medicine plan as outlined.  His QTC appears to have decreased from 479 to 458 on subsequent EKGs.  If it is stable, then he can be discharged on the current dose of Reglan 10 mg 3 times daily, with plans to decrease it to 5 mg 3 times daily a week later if he is able to get better glucose  control.   We will check on him tomorrow and see if he seems ready for discharge.  Would then follow-up in our clinic as scheduled.  Nelida Meuse III Office: [phone]

## 2018-07-30 DIAGNOSIS — I10 Essential (primary) hypertension: Secondary | ICD-10-CM

## 2018-07-30 DIAGNOSIS — R1011 Right upper quadrant pain: Secondary | ICD-10-CM

## 2018-07-30 DIAGNOSIS — E1110 Type 2 diabetes mellitus with ketoacidosis without coma: Principal | ICD-10-CM

## 2018-07-30 DIAGNOSIS — K3184 Gastroparesis: Secondary | ICD-10-CM

## 2018-07-30 LAB — BASIC METABOLIC PANEL
Anion gap: 8 (ref 5–15)
CO2: 27 mmol/L (ref 22–32)
Chloride: 104 mmol/L (ref 98–111)
Creatinine, Ser: 0.55 mg/dL — ABNORMAL LOW (ref 0.61–1.24)
GFR calc Af Amer: >60 mL/min (ref >60)
Glucose, Bld: 125 mg/dL — ABNORMAL HIGH (ref 70–99)
Sodium: 139 mmol/L (ref 135–145)

## 2018-07-30 LAB — CBC
HCT: 38.4 % — ABNORMAL LOW (ref 39.0–52.0)
Hemoglobin: 13.2 g/dL (ref 13.0–17.0)
MCH: 29.7 pg (ref 26.0–34.0)
MCHC: 34.4 g/dL (ref 30.0–36.0)
MCV: 86.5 fL (ref 78.0–100.0)
Platelets: 175 10*3/uL (ref 150–400)
RBC: 4.44 MIL/uL (ref 4.22–5.81)
RDW: 12.1 % (ref 11.5–15.5)
WBC: 8.1 10*3/uL (ref 4.0–10.5)

## 2018-07-30 LAB — GLUCOSE, CAPILLARY
GLUCOSE-CAPILLARY: 106 mg/dL — AB (ref 70–99)
Glucose-Capillary: 118 mg/dL — ABNORMAL HIGH (ref 70–99)
Glucose-Capillary: 129 mg/dL — ABNORMAL HIGH (ref 70–99)

## 2018-07-30 LAB — BASIC METABOLIC PANEL WITH GFR
BUN: 12 mg/dL (ref 6–20)
Calcium: 8.6 mg/dL — ABNORMAL LOW (ref 8.9–10.3)
GFR calc non Af Amer: >60 mL/min (ref >60)
Potassium: 3.4 mmol/L — ABNORMAL LOW (ref 3.5–5.1)

## 2018-07-30 LAB — MAGNESIUM: Magnesium: 2 mg/dL (ref 1.7–2.4)

## 2018-07-30 MED ORDER — PROCHLORPERAZINE 25 MG RE SUPP: 25.0000 mg | Freq: Three times a day (TID) | RECTAL | 0 refills | Status: DC | PRN

## 2018-07-30 MED ORDER — LORAZEPAM 2 MG/ML IJ SOLN: 0.5000 mg | Freq: Four times a day (QID) | INTRAMUSCULAR | Status: DC | PRN

## 2018-07-30 MED ORDER — SCOPOLAMINE 1 MG/3DAYS TD PT72: 1.0000 | MEDICATED_PATCH | TRANSDERMAL | 0 refills | Status: DC

## 2018-07-30 MED ORDER — DIPHENHYDRAMINE HCL 50 MG/ML IJ SOLN: 25.0000 mg | Freq: Three times a day (TID) | INTRAMUSCULAR | Status: DC | PRN

## 2018-07-30 MED ORDER — AMLODIPINE BESYLATE 10 MG PO TABS: 10.0000 mg | ORAL_TABLET | Freq: Every day | ORAL | 0 refills | Status: DC

## 2018-07-30 MED ORDER — INSULIN NPH (HUMAN) (ISOPHANE) 100 UNIT/ML ~~LOC~~ SUSP: 40.0000 [IU] | SUBCUTANEOUS | 0 refills | Status: DC

## 2018-07-30 NOTE — Discharge Summary (Signed)
Physician Discharge Summary  Grant [name]  TZG:[id number]  DOB: 04-30-1971  DOA: 07/23/2018 PCP: Grant Battles, MD  Admit date: 07/23/2018 Discharge date: 07/30/2018  Admitted From: Home Disposition: Home   Recommendations for Outpatient Follow-up:  1. Follow up with PCP in 1 week 2. Please obtain BMP/CBC in one week to monitor hemoglobin and renal function 3. Obtain EKG in 1 week to monitor QTc 4. Will need GI follow up in 2-4 weeks   Discharge Condition: Stable CODE STATUS: Full code Diet recommendation: Carb Modified  Brief/Interim Summary: For full details see H&P/Progress note, but in brief, Grant [name] is a 47 year old male with past medical history of hypertension, type 2 diabetes mellitus, peripheral neuropathy, hyperlipidemia who presented to the emergency department complaining of nausea and vomiting, found to be in DKA.  Patient was treated with IV insulin and started on long-acting insulin.  Nausea and vomiting felt to be related to gastroparesis, patient recently diagnosed with erosive esophagitis on July 2019.  GI was consulted and recommended conservative management with Reglan and scopolamine patches, along with glucose control.  Subsequently patient clinically improved, tolerated diet well and nausea and vomiting were well controlled.  Patient was deemed stable to follow-up with primary care physician and endocrinology as an outpatient.  Subjective: Patient seen and examined, nausea has significantly improved.  Patient now is tolerating diet with no further vomiting.  Remains afebrile.  Glucose improved.  Discharge Diagnoses/Hospital Course:    Diabetes mellitus, insulin dependent (IDDM), uncontrolled (HCC)   Essential hypertension   Peripheral neuropathy   Chronic low back pain   Abdominal pain   DKA (diabetic ketoacidoses) (HCC)   Nausea and vomiting   IDDM (insulin dependent diabetes mellitus) ([place])   Gastroparesis   Hematemesis   Type 2 diabetes mellitus  with peripheral neuropathy (HCC)   Esophagitis, [place] grade D   Long QT interval   Gastroesophageal reflux disease  Acute diabetes gastroparesis Patient with history of erosive/ulcerative esophagitis, GI was consulted and recommended supportive care, patient was started on Phenergan however developed prolonged QTC so this was discontinued.  Started on Compazine and scopolamine patch with improvement of symptoms.  High-dose antacid suppression was given.  Strict glucose control.  Patient will be discharged on scopolamine patch, Compazine suppository and Reglan 10 mgTID as QTc has normalized. Patient will need gastric emptying study as an outpatient. Follow up with GI in 2-4 weeks.   Type 2 diabetes mellitus complicated by DKA Anion gap has closed, CBGs stable.  Patient been treated with Lantus 50 mg nightly, CBGs are stable. Patient can't afford Lantus due to lack of insurance. Will continue home insulin with the following changes NPH 40 units in AM and 45 units at night. Regular insulin continue sliding scale. Follow up with endocrinologist as soon as possible.   Erosive/Ulcerative esophagitis  Continue PPI 40 mg bid and sucralfate  Follow up with GI   HTN  BP slight elevated during hospital stay, amlodipine was increased to 10 mg. BP improved. Follow up with PCP.   All other chronic medical condition were stable during the hospitalization.  On the day of the discharge the patient's vitals were stable, and no other acute medical condition were reported by patient. the patient was felt safe to be discharge to home.   Discharge Instructions  You were cared for by a hospitalist during your hospital stay. If you have any questions about your discharge medications or the care you received while you were in the hospital  after you are discharged, you can call the unit and asked to speak with the hospitalist on call if the hospitalist that took care of you is not available. Once you are  discharged, your primary care physician will handle any further medical issues. Please note that NO REFILLS for any discharge medications will be authorized once you are discharged, as it is imperative that you return to your primary care physician (or establish a relationship with a primary care physician if you do not have one) for your aftercare needs so that they can reassess your need for medications and monitor your lab values.  Discharge Instructions    Call MD for:  difficulty breathing, headache or visual disturbances   Complete by:  As directed    Call MD for:  extreme fatigue   Complete by:  As directed    Call MD for:  hives   Complete by:  As directed    Call MD for:  persistant dizziness or light-headedness   Complete by:  As directed    Call MD for:  persistant nausea and vomiting   Complete by:  As directed    Call MD for:  redness, tenderness, or signs of infection (pain, swelling, redness, odor or green/yellow discharge around incision site)   Complete by:  As directed    Call MD for:  severe uncontrolled pain   Complete by:  As directed    Call MD for:  temperature >100.4   Complete by:  As directed    Diet Carb Modified   Complete by:  As directed    Increase activity slowly   Complete by:  As directed      Allergies as of 07/30/2018      Reactions   Tylox [oxycodone-acetaminophen] Nausea And Vomiting      Medication List    TAKE these medications   acetaminophen 500 MG tablet Commonly known as:  TYLENOL Take 1,000 mg by mouth every 8 (eight) hours as needed for mild pain or headache.   amLODipine 10 MG tablet Commonly known as:  NORVASC Take 1 tablet (10 mg total) by mouth daily. Start taking on:  07/31/2018 What changed:    medication strength  how much to take   hydrOXYzine 25 MG tablet Commonly known as:  ATARAX/VISTARIL Take 25 mg by mouth at bedtime as needed for anxiety (sleep).   insulin NPH Human 100 UNIT/ML injection Commonly known as:   HUMULIN N,NOVOLIN N Inject 0.4-0.45 mLs (40-45 Units total) into the skin See admin instructions. Use 40 units every morning then use 45 units every evening What changed:    how much to take  additional instructions   insulin regular 100 units/mL injection Commonly known as:  NOVOLIN R,HUMULIN R Inject 4-8 Units into the skin 3 (three) times daily before meals. Sliding scale   metoCLOPramide 10 MG tablet Commonly known as:  REGLAN Take 1 tablet (10 mg total) by mouth 4 (four) times daily -  before meals and at bedtime. For 2 weeks   metoprolol succinate 25 MG 24 hr tablet Commonly known as:  TOPROL-XL Take 25 mg by mouth daily.   pantoprazole 40 MG tablet Commonly known as:  PROTONIX Take 1 tablet (40 mg total) by mouth 2 (two) times daily before a meal.   PARoxetine 10 MG tablet Commonly known as:  PAXIL Take 10 mg by mouth 2 (two) times daily.   prochlorperazine 25 MG suppository Commonly known as:  COMPAZINE Place 1 suppository (25 mg  total) rectally every 8 (eight) hours as needed for nausea or vomiting.   scopolamine 1 MG/3DAYS Commonly known as:  TRANSDERM-SCOP Place 1 patch (1.5 mg total) onto the skin every 3 (three) days.   sucralfate 1 GM/10ML suspension Commonly known as:  CARAFATE Take 10 mLs (1 g total) by mouth 4 (four) times daily -  with meals and at bedtime.      Follow-up Information    Grant Battles, MD. Schedule an appointment as soon as possible for a visit in 1 week(s).   Specialty:  Internal Medicine Why:  Hospital follow up  Contact information: [address] [place] [address] [phone]        Loralie Champagne, PA-C. Schedule an appointment as soon as possible for a visit in 2 week(s).   Specialty:  Gastroenterology Why:  Hospital follow up  Contact information: [place] [address] [phone]          Allergies  Allergen Reactions  . Tylox [Oxycodone-Acetaminophen] Nausea And Vomiting     Consultations:  GI    Procedures/Studies: Dg Chest Port 1 View  Result Date: 07/16/2018 CLINICAL DATA:  Shortness of breath EXAM: PORTABLE CHEST 1 VIEW COMPARISON:  06/09/2018 FINDINGS: Heart and mediastinal contours are within normal limits. No focal opacities or effusions. No acute bony abnormality. IMPRESSION: No active disease. Electronically Signed   By: Rolm Baptise M.D.   On: 07/16/2018 08:21   [place] Ekg Site Rite  Result Date: 07/26/2018 If Site Rite image not attached, placement could not be confirmed due to current cardiac rhythm.   Discharge Exam: Vitals:   07/29/18 2013 07/30/18 0404  BP: 116/84 112/77  Pulse: 77 70  Resp: 14 14  Temp: 97.9 F (36.6 C) 97.6 F (36.4 C)  SpO2: 99% 97%   Vitals:   07/29/18 1200 07/29/18 1316 07/29/18 2013 07/30/18 0404  BP:  (!) 126/92 116/84 112/77  Pulse:  88 77 70  Resp:  16 14 14   Temp: 97.6 F (36.4 C) 97.7 F (36.5 C) 97.9 F (36.6 C) 97.6 F (36.4 C)  TempSrc: Oral Oral Oral Oral  SpO2:  99% 99% 97%  Weight:      Height:        General: Pt is alert, awake, not in acute distress Cardiovascular: RRR, S1/S2 +, no rubs, no gallops Respiratory: CTA bilaterally, no wheezing, no rhonchi Abdominal: Soft, NT, ND, bowel sounds + Extremities: no edema, no cyanosis   The results of significant diagnostics from this hospitalization (including imaging, microbiology, ancillary and laboratory) are listed below for reference.     Microbiology: Recent Results (from the past 240 hour(s))  MRSA PCR Screening     Status: None   Collection Time: 07/24/18  1:33 AM  Result Value Ref Range Status   MRSA by PCR NEGATIVE NEGATIVE Final    Comment:        The GeneXpert MRSA Assay (FDA approved for NASAL specimens only), is one component of a comprehensive MRSA colonization surveillance program. It is not intended to diagnose MRSA infection nor to guide or monitor treatment for MRSA infections. Performed at [facility] [place] [address], [place], [place] [address]      Labs: BNP (last 3 results) No results for input(s): BNP in the last 8760 hours. Basic Metabolic Panel: Recent Labs  Lab 07/24/18 9935  07/25/18 0344 07/26/18 0331 07/26/18 2117 07/27/18 0417 07/28/18 0347 07/29/18 0626 07/30/18 0400  NA 141   < >  141 141 138 141 139 139 139  K 3.8   < > 3.8 3.3* 3.4* 3.8 3.7 3.3* 3.4*  CL 106   < > 109 105 107 108 106 104 104  CO2 24   < > 25 23 22 23 27 28 27   GLUCOSE 143*   < > 160* 117* 285* 187* 145* 96 125*  BUN 6   < > <5* 5* 8 10 13 13 12   CREATININE 0.59*   < > 0.51* 0.51* 0.65 0.53* 0.49* 0.57* 0.55*  CALCIUM 9.3   < > 8.7* 9.4 9.2 9.6 8.9 8.7* 8.6*  MG  --   --  2.1 1.9  --  1.8 2.0  --  2.0  PHOS 2.0*  --  3.5 3.5  --  3.6 3.4  --   --    < > = values in this interval not displayed.   Liver Function Tests: Recent Labs  Lab 07/24/18 0822 07/25/18 0344 07/26/18 0331 07/27/18 0417 07/28/18 0347  AST 29 31 22 18 17   ALT 15 17 19 18 17   ALKPHOS 71 58 65 60 50  BILITOT 1.4* 1.3* 1.2 1.1 0.9  PROT 7.6 6.5 7.1 6.8 6.3*  ALBUMIN 4.4 3.8 4.3 4.2 3.7   Recent Labs  Lab 07/23/18 2012  LIPASE 30   No results for input(s): AMMONIA in the last 168 hours. CBC: Recent Labs  Lab 07/24/18 0822 07/25/18 0344 07/26/18 0331 07/27/18 0417 07/28/18 0347 07/30/18 0400  WBC 15.1* 10.7* 13.1* 13.0* 10.4 8.1  NEUTROABS 11.4* 6.9 7.9* 9.9* 8.1*  --   HGB 16.9 14.9 16.4 15.5 14.0 13.2  HCT 47.9 43.3 47.0 44.5 40.5 38.4*  MCV 85.2 87.7 86.9 86.4 86.9 86.5  PLT 255 211 260 259 207 175   Cardiac Enzymes: No results for input(s): CKTOTAL, CKMB, CKMBINDEX, TROPONINI in the last 168 hours. BNP: Invalid input(s): POCBNP CBG: Recent Labs  Lab 07/29/18 2010 07/29/18 2247 07/30/18 0403 07/30/18 0741 07/30/18 1209  GLUCAP 126* 119* 118* 106* 129*   D-Dimer No results for input(s): DDIMER in the last 72 hours. Hgb A1c No results for input(s): HGBA1C in the last  72 hours. Lipid Profile No results for input(s): CHOL, HDL, LDLCALC, TRIG, CHOLHDL, LDLDIRECT in the last 72 hours. Thyroid function studies No results for input(s): TSH, T4TOTAL, T3FREE, THYROIDAB in the last 72 hours.  Invalid input(s): FREET3 Anemia work up No results for input(s): VITAMINB12, FOLATE, FERRITIN, TIBC, IRON, RETICCTPCT in the last 72 hours. Urinalysis    Component Value Date/Time   COLORURINE YELLOW 07/23/2018 2024   APPEARANCEUR CLEAR 07/23/2018 2024   LABSPEC 1.020 07/23/2018 2024   PHURINE 7.0 07/23/2018 2024   GLUCOSEU >=500 (A) 07/23/2018 2024   HGBUR NEGATIVE 07/23/2018 2024   BILIRUBINUR NEGATIVE 07/23/2018 2024   KETONESUR 80 (A) 07/23/2018 2024   PROTEINUR NEGATIVE 07/23/2018 2024   UROBILINOGEN 1.0 09/06/2008 0900   NITRITE NEGATIVE 07/23/2018 2024   LEUKOCYTESUR NEGATIVE 07/23/2018 2024   Sepsis Labs Invalid input(s): PROCALCITONIN,  WBC,  LACTICIDVEN Microbiology Recent Results (from the past 240 hour(s))  MRSA PCR Screening     Status: None   Collection Time: 07/24/18  1:33 AM  Result Value Ref Range Status   MRSA by PCR NEGATIVE NEGATIVE Final    Comment:        The GeneXpert MRSA Assay (FDA approved for NASAL specimens only), is one component of a comprehensive MRSA colonization surveillance program. It is not intended to diagnose MRSA infection  nor to guide or monitor treatment for MRSA infections. Performed at [facility], [place] [address], [place], [place] [address]     Time coordinating discharge: 32 minutes  SIGNED:  Chipper Oman, MD  [facility] 07/30/2018, 12:16 PM  Pager please text page via  [email or web address]  Note - This record has been created using [employer]. Chart creation errors have been sought, but may not always have been located. Such creation errors do not reflect on the standard of medical care.

## 2018-07-30 NOTE — Care Management Note (Signed)
Case Management Note  Patient Details  Name: Grant [name] MRN: [id number] Date of Birth: 20-Jan-1971  Subjective/Objective: patient has no health insurance-has pcp(will make own appt), endocrinologist, has spouse support,transp,pharmacy-[facility]. Provided w/health insurance resource(affordable care act,Dept Social Service,goodrx website)-states he lost his job. Recc to discuss w/his wife about adding him to health insurance when enrollment period opens in October 2019. Patient used the [facility] program on 06/25/18(unable to use again until next year.provided with goodrx discount coupons for scopolamine patch $34/compazine supp-$89-$97.patient voiced understanding. MD updated.                 Action/Plan:d/c home.   Expected Discharge Date:                  Expected Discharge Plan:  Home/Self Care  In-House Referral:     Discharge planning Services  CM Consult, Medication Assistance  Post Acute Care Choice:    Choice offered to:     DME Arranged:    DME Agency:     HH Arranged:    HH Agency:     Status of Service:  Completed, signed off  If discussed at [employer] of Stay Meetings, dates discussed:    Additional Comments:  Dessa Phi, RN 07/30/2018, 10:56 AM

## 2018-07-30 NOTE — Progress Notes (Addendum)
[facility] Progress Note  Chief Complaint: Nausea and vomiting  History:  I saw the patient along with his entire care team managed by Dr. Quincy Simmonds.  Grant [name] reports improvement in upper abdominal pain, nausea and vomiting.  Symptoms are controlled enough on current medicines that he is able to keep down liquids and solids.  His glucose is under better management and it was at time of admission.  Extensive efforts are being made to coordinate care with primary endocrinologist after discharge for optimal glucose management.  He has intermittent nausea, no vomiting last 24 hours and occasional upper abdominal pain.  Expresses concern and anxiety about possible return of symptoms and need for repeat hospitalization.  ROS: Cardiovascular:  no chest pain Respiratory: no dyspnea  Objective:  Med list reviewed  Vital signs in last 24 hrs: Vitals:   07/29/18 2013 07/30/18 0404  BP: 116/84 112/77  Pulse: 77 70  Resp: 14 14  Temp: 97.9 F (36.6 C) 97.6 F (36.4 C)  SpO2: 99% 97%    Physical Exam   HEENT: sclera anicteric, oral mucosa moist without lesions  Neck: supple, no thyromegaly, JVD or lymphadenopathy  Cardiac: RRR without murmurs, S1S2 heard, no peripheral edema  Pulm: clear to auscultation bilaterally, normal RR and effort noted  Abdomen: soft, no tenderness, with active bowel sounds. No guarding or palpable hepatosplenomegaly  Skin; warm and dry, no jaundice or rash  Recent Labs:  Recent Labs  Lab 07/27/18 0417 07/28/18 0347 07/30/18 0400  WBC 13.0* 10.4 8.1  HGB 15.5 14.0 13.2  HCT 44.5 40.5 38.4*  PLT 259 207 175   Recent Labs  Lab 07/28/18 0347  07/30/18 0400  NA 139   < > 139  K 3.7   < > 3.4*  CL 106   < > 104  CO2 27   < > 27  BUN 13   < > 12  ALBUMIN 3.7  --   --   ALKPHOS 50  --   --   ALT 17  --   --   AST 17  --   --   GLUCOSE 145*   < > 125*   < > = values in this interval not displayed.   No results for input(s): INR in the last 168  hours.  Radiologic studies: None today  @ASSESSMENTPLANBEGIN @ Assessment:  Upper abdominal pain Nausea and vomiting Diabetic gastropathy with poorly controlled insulin requiring diabetes and poor compliance.    Plan: Plan as outlined in yesterday's note.  No changes to that today. I reviewed that plan with his primary team.  He is currently on a scopolamine patch which he will apparently continue as needed after discharge.  He also can use Compazine suppositories as rescue therapy if he is unable to keep down oral medicines.  Hopefully, with continued use of Reglan and good glucose management, symptoms will remain under reasonably good control, hopefully to the point that the Reglan can be tapered off.   25 minutes were spent face-to-face with the patient. Greater than 50% of the time used for counseling as well as treatment plan and follow-up.  Grant [name] Office: [phone]

## 2018-07-30 NOTE — Progress Notes (Signed)
Goodrx discount coupons for [facility]-$29-scopolamine 1mg  patches/$45 compazine 25mg  supp-patient can afford.

## 2018-08-01 ENCOUNTER — Emergency Department (HOSPITAL_COMMUNITY): Payer: Self-pay

## 2018-08-01 ENCOUNTER — Encounter (HOSPITAL_COMMUNITY): Payer: Self-pay | Admitting: Emergency Medicine

## 2018-08-01 ENCOUNTER — Other Ambulatory Visit: Payer: Self-pay

## 2018-08-01 ENCOUNTER — Emergency Department (HOSPITAL_COMMUNITY)
Admission: EM | Admit: 2018-08-01 | Discharge: 2018-08-01 | Disposition: A | Discharge status: Home / Self Care | Payer: Self-pay | Attending: Emergency Medicine | Admitting: Emergency Medicine

## 2018-08-01 DIAGNOSIS — R109 Unspecified abdominal pain: Secondary | ICD-10-CM

## 2018-08-01 DIAGNOSIS — I25111 Atherosclerotic heart disease of native coronary artery with angina pectoris with documented spasm: Secondary | ICD-10-CM | POA: Insufficient documentation

## 2018-08-01 DIAGNOSIS — IMO0001 Reserved for inherently not codable concepts without codable children: Secondary | ICD-10-CM | POA: Diagnosis present

## 2018-08-01 DIAGNOSIS — E1165 Type 2 diabetes mellitus with hyperglycemia: Secondary | ICD-10-CM

## 2018-08-01 DIAGNOSIS — E1140 Type 2 diabetes mellitus with diabetic neuropathy, unspecified: Secondary | ICD-10-CM | POA: Insufficient documentation

## 2018-08-01 DIAGNOSIS — G43A0 Cyclical vomiting, not intractable: Secondary | ICD-10-CM | POA: Insufficient documentation

## 2018-08-01 DIAGNOSIS — I10 Essential (primary) hypertension: Secondary | ICD-10-CM | POA: Insufficient documentation

## 2018-08-01 DIAGNOSIS — F17210 Nicotine dependence, cigarettes, uncomplicated: Secondary | ICD-10-CM | POA: Insufficient documentation

## 2018-08-01 DIAGNOSIS — Z79899 Other long term (current) drug therapy: Secondary | ICD-10-CM | POA: Insufficient documentation

## 2018-08-01 DIAGNOSIS — F12188 Cannabis abuse with other cannabis-induced disorder: Secondary | ICD-10-CM | POA: Diagnosis present

## 2018-08-01 DIAGNOSIS — Z794 Long term (current) use of insulin: Secondary | ICD-10-CM | POA: Insufficient documentation

## 2018-08-01 DIAGNOSIS — R9431 Abnormal electrocardiogram [ECG] [EKG]: Secondary | ICD-10-CM | POA: Diagnosis present

## 2018-08-01 DIAGNOSIS — K3184 Gastroparesis: Secondary | ICD-10-CM | POA: Diagnosis present

## 2018-08-01 LAB — COMPREHENSIVE METABOLIC PANEL
ALBUMIN: 4.5 g/dL (ref 3.5–5.0)
ALT: 22 U/L (ref 0–44)
ANION GAP: 17 — AB (ref 5–15)
AST: 34 U/L (ref 15–41)
Alkaline Phosphatase: 69 U/L (ref 38–126)
BUN: 6 mg/dL (ref 6–20)
CHLORIDE: 100 mmol/L (ref 98–111)
CO2: 22 mmol/L (ref 22–32)
Calcium: 9.6 mg/dL (ref 8.9–10.3)
Creatinine, Ser: 0.65 mg/dL (ref 0.61–1.24)
GFR calc Af Amer: >60 mL/min (ref >60)
GFR calc non Af Amer: >60 mL/min (ref >60)
GLUCOSE: 174 mg/dL — AB (ref 70–99)
POTASSIUM: 3.9 mmol/L (ref 3.5–5.1)
Sodium: 139 mmol/L (ref 135–145)
Total Bilirubin: 1.5 mg/dL — ABNORMAL HIGH (ref 0.3–1.2)
Total Protein: 7.4 g/dL (ref 6.5–8.1)

## 2018-08-01 LAB — BLOOD GAS, VENOUS
ACID-BASE EXCESS: 1.7 mmol/L (ref 0.0–2.0)
Bicarbonate: 22.7 mmol/L (ref 20.0–28.0)
FIO2: 21
O2 SAT: 96.8 %
PATIENT TEMPERATURE: 98.6
pCO2, Ven: 27.1 mmHg — ABNORMAL LOW (ref 44.0–60.0)
pH, Ven: 7.533 — ABNORMAL HIGH (ref 7.250–7.430)
pO2, Ven: 78.9 mmHg — ABNORMAL HIGH (ref 32.0–45.0)

## 2018-08-01 LAB — RAPID URINE DRUG SCREEN, HOSP PERFORMED
Amphetamines: NOT DETECTED
BENZODIAZEPINES: POSITIVE — AB
Barbiturates: NOT DETECTED
COCAINE: NOT DETECTED
OPIATES: NOT DETECTED
Tetrahydrocannabinol: POSITIVE — AB

## 2018-08-01 LAB — CBC WITH DIFFERENTIAL/PLATELET
BASOS ABS: 0 10*3/uL (ref 0.0–0.1)
BASOS PCT: 0 %
EOS ABS: 0.1 10*3/uL (ref 0.0–0.7)
EOS PCT: 1 %
HEMATOCRIT: 43.8 % (ref 39.0–52.0)
Hemoglobin: 15.9 g/dL (ref 13.0–17.0)
Lymphocytes Relative: 12 %
Lymphs Abs: 1.5 10*3/uL (ref 0.7–4.0)
MCH: 30.4 pg (ref 26.0–34.0)
MCHC: 36.3 g/dL — ABNORMAL HIGH (ref 30.0–36.0)
MCV: 83.7 fL (ref 78.0–100.0)
MONO ABS: 1 10*3/uL (ref 0.1–1.0)
Monocytes Relative: 8 %
NEUTROS ABS: 10 10*3/uL — AB (ref 1.7–7.7)
Neutrophils Relative %: 79 %
PLATELETS: 222 10*3/uL (ref 150–400)
RBC: 5.23 MIL/uL (ref 4.22–5.81)
RDW: 12 % (ref 11.5–15.5)
WBC: 12.6 10*3/uL — ABNORMAL HIGH (ref 4.0–10.5)

## 2018-08-01 LAB — URINALYSIS, ROUTINE W REFLEX MICROSCOPIC
Bacteria, UA: NONE SEEN
Bilirubin Urine: NEGATIVE
Glucose, UA: >=500 mg/dL — AB
HGB URINE DIPSTICK: NEGATIVE
KETONES UR: 20 mg/dL — AB
LEUKOCYTES UA: NEGATIVE
NITRITE: NEGATIVE
PROTEIN: NEGATIVE mg/dL
Specific Gravity, Urine: 1.009 (ref 1.005–1.030)
pH: 7 (ref 5.0–8.0)

## 2018-08-01 LAB — LIPASE, BLOOD: Lipase: 48 U/L (ref 11–51)

## 2018-08-01 MED ORDER — METOCLOPRAMIDE HCL 5 MG/ML IJ SOLN
10.0000 mg | Freq: Once | INTRAMUSCULAR | Status: AC
  Administered 2018-08-01: 10 mg via INTRAMUSCULAR
  Filled 2018-08-01: qty 2

## 2018-08-01 MED ORDER — METOCLOPRAMIDE HCL 5 MG/ML IJ SOLN: 10.0000 mg | Freq: Four times a day (QID) | INTRAMUSCULAR | Status: DC | PRN

## 2018-08-01 MED ORDER — CAPSAICIN 0.025 % EX CREA
TOPICAL_CREAM | Freq: Two times a day (BID) | CUTANEOUS | Status: DC
  Filled 2018-08-01: qty 60

## 2018-08-01 MED ORDER — SODIUM CHLORIDE 0.9 % IV BOLUS
1000.0000 mL | Freq: Once | INTRAVENOUS | Status: AC
  Administered 2018-08-01: 1000 mL via INTRAVENOUS

## 2018-08-01 MED ORDER — LORAZEPAM 2 MG/ML IJ SOLN
1.0000 mg | Freq: Once | INTRAMUSCULAR | Status: AC
  Administered 2018-08-01: 1 mg via INTRAVENOUS
  Filled 2018-08-01: qty 1

## 2018-08-01 MED ORDER — SCOPOLAMINE 1 MG/3DAYS TD PT72
1.0000 | MEDICATED_PATCH | TRANSDERMAL | Status: DC
  Administered 2018-08-01: 1.5 mg via TRANSDERMAL
  Filled 2018-08-01: qty 1

## 2018-08-01 MED ORDER — METOCLOPRAMIDE HCL 5 MG/ML IJ SOLN
10.0000 mg | Freq: Once | INTRAMUSCULAR | Status: AC
  Administered 2018-08-01: 10 mg via INTRAVENOUS
  Filled 2018-08-01: qty 2

## 2018-08-01 MED ORDER — PROCHLORPERAZINE EDISYLATE 10 MG/2ML IJ SOLN
10.0000 mg | Freq: Once | INTRAMUSCULAR | Status: AC
  Administered 2018-08-01: 10 mg via INTRAVENOUS
  Filled 2018-08-01: qty 2

## 2018-08-01 MED ORDER — PROMETHAZINE HCL 25 MG RE SUPP
25.0000 mg | RECTAL | Status: DC | PRN
  Administered 2018-08-01: 25 mg via RECTAL
  Filled 2018-08-01 (×2): qty 1

## 2018-08-01 MED ORDER — CAPSAICIN 0.075 % EX CREA
TOPICAL_CREAM | Freq: Two times a day (BID) | CUTANEOUS | Status: DC
  Filled 2018-08-01: qty 60

## 2018-08-01 MED ORDER — DICYCLOMINE HCL 10 MG/ML IM SOLN
20.0000 mg | Freq: Once | INTRAMUSCULAR | Status: AC
  Administered 2018-08-01: 20 mg via INTRAMUSCULAR
  Filled 2018-08-01: qty 2

## 2018-08-01 NOTE — ED Triage Notes (Signed)
Pt from home with c/o abd  Pain with n/v since 0300 this AM. Hx gastroparesis and symptom intermittent over last month. Discharge from [facility] 1 week ago for same. BP 196/115 HR: 109 resp: 24 sat i97% room and CBG: 138

## 2018-08-01 NOTE — ED Notes (Signed)
hospitalist at bedside

## 2018-08-01 NOTE — ED Notes (Signed)
Pt states that he is a hard stick and recently had PICC line removed, bruising assessed on right upper arm.  Getting Megan, Agricultural consultant, to look with ultrasound for IV access

## 2018-08-01 NOTE — ED Notes (Signed)
Made Claiborne Billings PA aware patient requesting more nausea medications and already been given Phenergan suppository, Reglan every 6 hours. Wife requesting Compazine-pt last dose was at 9am.

## 2018-08-01 NOTE — ED Notes (Signed)
ED Provider at bedside. 

## 2018-08-01 NOTE — ED Notes (Signed)
Patient transported to X-ray 

## 2018-08-01 NOTE — ED Notes (Addendum)
Pt wife asking what the plan is since he just got out od hospital 2 days ago where he was in ICU for 6 days and in regular room for day. Pt wanting to know if patient can get more medication for nausea and pain medicaion.  Informed patient that the patch I just applied was for n/v but I would let Claiborne Billings PA aware pt request for pain medications.  Made PA Claiborne Billings aware of request for medications, awaiting new orders.

## 2018-08-01 NOTE — Consult Note (Signed)
Patient Demographics:    Grant [name], is a 47 y.o. male  MRN: [id number]   DOB - 19-Dec-1970  Admit Date - 08/01/2018  Outpatient Primary MD for the patient is [name], MD   Assessment & Plan:    Principal Problem:   Cannabis hyperemesis syndrome concurrent with and due to cannabis abuse [facility]) Active Problems:   Diabetes mellitus, insulin dependent (IDDM), uncontrolled ([place])   Gastroparesis   Long QT interval    1) cannabis hyperemesis syndrome concurrent with an due to cannabis abuse--- patient admits to daily cannabis use, which is probably perpetuating his intractable nausea and vomiting mechanism of these syndrome discussed with patient and his significant other at bedside.  ED provider ordered capsicin topical cream for patient but he refused and left, is advised to pick up his antiemetics specifically oral Reglan and rectal Phenergan.  He is advised to avoid Zofran due to prolonged QT.  This case was discussed with the in-house pharmacist  2)DM2 -diabetes last A1c 9.4 patient has been a diabetic for over 15 years he probably has some component of diabetic gastroparesis patient also has peripheral neuropathy, Reglan and Phenergan as prescribed as above #1.  Improve compliance with diabetic regimen advised  3) intractable emesis-abdominal x-rays without acute findings, suspect intractable emesis is due to combination of #1 and #2 above.  Abdominal exam reveals benign nonacute abdomen  This is an ED consult note for further management and discharge orders per ED provider  Patient received IV fluids and antiemetics in the ED he felt better he and his significant other decided to leave he will follow-up with his PCP    With History of - Reviewed by me  Past Medical History:  Diagnosis Date  .  Chronic low back pain 11/30/2015  . Diabetic peripheral neuropathy ([place]) 11/30/2015  . Hypertension   . Neuropathy   . Type 2 diabetes mellitus ([place])       Past Surgical History:  Procedure Laterality Date  . back lipoma resection  03/2006  . BIOPSY  06/15/2018   Procedure: BIOPSY;  Surgeon: Jerene Bears, MD;  Location: [facility] ENDOSCOPY;  Service: Gastroenterology;;  . ESOPHAGOGASTRODUODENOSCOPY (EGD) WITH PROPOFOL N/A 06/15/2018   Procedure: ESOPHAGOGASTRODUODENOSCOPY (EGD) WITH PROPOFOL;  Surgeon: Jerene Bears, MD;  Location: [place];  Service: Gastroenterology;  Laterality: N/A;  . KNEE ARTHROSCOPY  10/2008   left  . LEFT HEART CATHETERIZATION WITH CORONARY ANGIOGRAM N/A 08/11/2013   Procedure: LEFT HEART CATHETERIZATION WITH CORONARY ANGIOGRAM;  Surgeon: Pixie Casino, MD;  Location: [facility] CATH LAB;  Service: Cardiovascular;  Laterality: N/A;  . UPPER GI ENDOSCOPY  10/2007   showed gastritis      Chief Complaint  Patient presents with  . Abdominal Pain  . Emesis      HPI:    Grant [name]  is a 46 y.o. male past medical history remarkable for poorly controlled diabetes, gastroparesis, cannabis abuse, h/o severe ulcerative  esophagitis/GERD, anxiety. He was recently in the hospital for DKA from 8/28-07/30/18. He states that he woke up this morning and acutely started having abdominal pain, N/V this morning at 3AM. He states his symptoms are the same when he came in to the hospital last time. He has been taking his insulin. He denies fevers. CBG this morning was 130. He tried zofran without relief. He has not been able to take Reglan suppositories because they haven't come from the pharmacy yet.   Questioning patient admits to smoking marijuana this morning, he has been smoking large amounts of marijuana daily since leaving the hospital  Chest pains, palpitations, no dizziness, no fevers no chills no urinary symptoms, no diarrhea  In ED--- abdominal exam is largely benign, abdominal  x-rays without acute findings, patient received antiemetics and IV fluids, patient stated that he was feeling better in the left to go home with his significant other    Review of systems:    In addition to the HPI above,   A full Review of  Systems was done, all other systems reviewed are negative except as noted above in HPI , .    Social History:  Reviewed by me    Social History   Tobacco Use  . Smoking status: Current Every Day Smoker    Packs/day: 0.50    Years: 20.00    Pack years: 10.00  . Smokeless tobacco: Current User    Types: Chew  . Tobacco comment: 5-7 cigarettes/day; chews occasionally  Substance Use Topics  . Alcohol use: No       Family History :  Reviewed by me    Family History  Problem Relation Age of Onset  . Diabetes Mother   . Hypertension Mother   . Hypertension Father   . Hyperlipidemia Father   . Alcohol abuse Brother   . Brain cancer Unknown        grandparent  . Stomach cancer Unknown        grandparent     Home Medications:   Prior to Admission medications   Medication Sig Start Date End Date Taking? Authorizing Provider  acetaminophen (TYLENOL) 500 MG tablet Take 1,000 mg by mouth every 8 (eight) hours as needed for mild pain or headache.   Yes [provider]  amLODipine (NORVASC) 10 MG tablet Take 1 tablet (10 mg total) by mouth daily. 07/31/18  Yes Doreatha Lew, MD  hydrOXYzine (ATARAX/VISTARIL) 25 MG tablet Take 25 mg by mouth at bedtime as needed for anxiety (sleep).  07/11/18  Yes [provider]  insulin NPH Human (HUMULIN N,NOVOLIN N) 100 UNIT/ML injection Inject 0.4-0.45 mLs (40-45 Units total) into the skin See admin instructions. Use 40 units every morning then use 45 units every evening 07/30/18  Yes Patrecia Pour, Christean Grief, MD  insulin regular (NOVOLIN R,HUMULIN R) 100 units/mL injection Inject 4-8 Units into the skin 3 (three) times daily before meals. Sliding scale   Yes [provider]    metoCLOPramide (REGLAN) 10 MG tablet Take 1 tablet (10 mg total) by mouth 4 (four) times daily -  before meals and at bedtime. For 2 weeks 07/17/18  Yes Zehr, Janett Billow D, PA-C  metoprolol succinate (TOPROL-XL) 25 MG 24 hr tablet Take 25 mg by mouth daily. 06/07/18  Yes [provider]  pantoprazole (PROTONIX) 40 MG tablet Take 1 tablet (40 mg total) by mouth 2 (two) times daily before a meal. 07/17/18  Yes Zehr, Janett Billow D, PA-C  PARoxetine (PAXIL) 10  MG tablet Take 10 mg by mouth 2 (two) times daily. 05/08/18  Yes [provider]  prochlorperazine (COMPAZINE) 25 MG suppository Place 1 suppository (25 mg total) rectally every 8 (eight) hours as needed for nausea or vomiting. 07/30/18  Yes Patrecia Pour, Christean Grief, MD  scopolamine (TRANSDERM-SCOP) 1 MG/3DAYS Place 1 patch (1.5 mg total) onto the skin every 3 (three) days. 07/30/18  Yes Patrecia Pour, Christean Grief, MD  sucralfate (CARAFATE) 1 GM/10ML suspension Take 10 mLs (1 g total) by mouth 4 (four) times daily -  with meals and at bedtime. 06/17/18 07/17/18  Arrien, Jimmy Picket, MD     Allergies:     Allergies  Allergen Reactions  . Tylox [Oxycodone-Acetaminophen] Nausea And Vomiting     Physical Exam:   Vitals  Blood pressure 98/62, pulse (!) 109, temperature 98.6 F (37 C), temperature source Oral, resp. rate 19, height 5\' 11"  (1.803 m), weight 102.5 kg, SpO2 98 %.  Physical Examination: General appearance - alert, well appearing, and in no distress  Mental status - alert, oriented to person, place, and time, Eyes - sclera anicteric Neck - supple, no JVD elevation , Chest - clear  to auscultation bilaterally, symmetrical air movement,  Heart - S1 and S2 normal,  Abdomen - soft, nontender, nondistended, no masses or organomegaly Neurological - screening mental status exam normal, neck supple without rigidity, cranial nerves II through XII intact, DTR's normal and symmetric Extremities - no pedal edema noted, intact peripheral pulses   Skin - warm, dry     Data Review:    CBC Recent Labs  Lab 07/26/18 0331 07/27/18 0417 07/28/18 0347 07/30/18 0400 08/01/18 0749  WBC 13.1* 13.0* 10.4 8.1 12.6*  HGB 16.4 15.5 14.0 13.2 15.9  HCT 47.0 44.5 40.5 38.4* 43.8  PLT 260 259 207 175 222  MCV 86.9 86.4 86.9 86.5 83.7  MCH 30.3 30.1 30.0 29.7 30.4  MCHC 34.9 34.8 34.6 34.4 36.3*  RDW 12.1 12.2 12.2 12.1 12.0  LYMPHSABS 3.7 2.1 1.5  --  1.5  MONOABS 1.2* 1.0 0.7  --  1.0  EOSABS 0.2 0.0 0.0  --  0.1  BASOSABS 0.0 0.0 0.0  --  0.0   ------------------------------------------------------------------------------------------------------------------  Chemistries  Recent Labs  Lab 07/26/18 0331  07/27/18 0417 07/28/18 0347 07/29/18 0626 07/30/18 0400 08/01/18 0749  NA 141   < > 141 139 139 139 139  K 3.3*   < > 3.8 3.7 3.3* 3.4* 3.9  CL 105   < > 108 106 104 104 100  CO2 23   < > 23 27 28 27 22   GLUCOSE 117*   < > 187* 145* 96 125* 174*  BUN 5*   < > 10 13 13 12 6   CREATININE 0.51*   < > 0.53* 0.49* 0.57* 0.55* 0.65  CALCIUM 9.4   < > 9.6 8.9 8.7* 8.6* 9.6  MG 1.9  --  1.8 2.0  --  2.0  --   AST 22  --  18 17  --   --  34  ALT 19  --  18 17  --   --  22  ALKPHOS 65  --  60 50  --   --  69  BILITOT 1.2  --  1.1 0.9  --   --  1.5*   < > = values in this interval not displayed.   ------------------------------------------------------------------------------------------------------------------ estimated creatinine clearance is 137.7 mL/min (by C-G formula based on SCr of 0.65 mg/dL). ------------------------------------------------------------------------------------------------------------------  No results for input(s): TSH, T4TOTAL, T3FREE, THYROIDAB in the last 72 hours.  Invalid input(s): FREET3   Coagulation profile No results for input(s): INR, PROTIME in the last 168 hours. ------------------------------------------------------------------------------------------------------------------- No results  for input(s): DDIMER in the last 72 hours. -------------------------------------------------------------------------------------------------------------------  Cardiac Enzymes No results for input(s): CKMB, TROPONINI, MYOGLOBIN in the last 168 hours.  Invalid input(s): CK ------------------------------------------------------------------------------------------------------------------ No results found for: BNP   ---------------------------------------------------------------------------------------------------------------  Urinalysis    Component Value Date/Time   COLORURINE YELLOW 08/01/2018 0736   APPEARANCEUR CLEAR 08/01/2018 0736   LABSPEC 1.009 08/01/2018 0736   PHURINE 7.0 08/01/2018 0736   GLUCOSEU >=500 (A) 08/01/2018 0736   HGBUR NEGATIVE 08/01/2018 0736   BILIRUBINUR NEGATIVE 08/01/2018 0736   KETONESUR 20 (A) 08/01/2018 0736   PROTEINUR NEGATIVE 08/01/2018 0736   UROBILINOGEN 1.0 09/06/2008 0900   NITRITE NEGATIVE 08/01/2018 0736   LEUKOCYTESUR NEGATIVE 08/01/2018 0736    ----------------------------------------------------------------------------------------------------------------   Imaging Results:    Dg Abd Acute W/chest  Result Date: 08/01/2018 CLINICAL DATA:  Abdominal pain, nausea and vomiting since this morning. EXAM: DG ABDOMEN ACUTE W/ 1V CHEST COMPARISON:  Chest x-ray dated 07/16/2018. FINDINGS: Single-view of the chest: Heart size and mediastinal contours are within normal limits. Lungs are clear. No pleural effusion or pneumothorax seen. Osseous structures about the chest are unremarkable. Supine and upright views of the abdomen: Overall bowel gas pattern is nonobstructive. Moderate amount of stool in the RIGHT colon. No evidence of soft tissue mass or abnormal fluid collection. No evidence of free intraperitoneal air. Visualized osseous structures of the abdomen and pelvis are unremarkable. IMPRESSION: Negative abdominal radiographs.  No acute  cardiopulmonary disease. Electronically Signed   By: Franki Cabot M.D.   On: 08/01/2018 12:12    Radiological Exams on Admission: Dg Abd Acute W/chest  Result Date: 08/01/2018 CLINICAL DATA:  Abdominal pain, nausea and vomiting since this morning. EXAM: DG ABDOMEN ACUTE W/ 1V CHEST COMPARISON:  Chest x-ray dated 07/16/2018. FINDINGS: Single-view of the chest: Heart size and mediastinal contours are within normal limits. Lungs are clear. No pleural effusion or pneumothorax seen. Osseous structures about the chest are unremarkable. Supine and upright views of the abdomen: Overall bowel gas pattern is nonobstructive. Moderate amount of stool in the RIGHT colon. No evidence of soft tissue mass or abnormal fluid collection. No evidence of free intraperitoneal air. Visualized osseous structures of the abdomen and pelvis are unremarkable. IMPRESSION: Negative abdominal radiographs.  No acute cardiopulmonary disease. Electronically Signed   By: Franki Cabot M.D.   On: 08/01/2018 12:12    DVT Prophylaxis -SCD   AM Labs Ordered, also please review Full Orders  Family Communication: Admission, patients condition and plan of care including tests being ordered have been discussed with the patient and s/o at bedside who indicate understanding and agree with the plan   Code Status - Full Code  Likely DC to  home  Condition   stable Roxan Hockey M.D on 08/01/2018 at 1:49 PM Pager---[phone] Go to [email or web address] - [email or web address] for contact info  [facility] - Office  [phone]

## 2018-08-01 NOTE — ED Notes (Signed)
Bed: HH83 Expected date:  Expected time:  Means of arrival:  Comments: EMS 46 yo male from home abdominal pain-hx gastroparesis-Zofran IM-unable to obtain IV HR 102  BP204/100

## 2018-08-01 NOTE — ED Provider Notes (Addendum)
[facility] DEPT Provider Note   CSN: [id number] Arrival date & time: 08/01/18  7829     History   Chief Complaint Chief Complaint  Patient presents with  . Abdominal Pain  . Emesis    HPI Grant [name] is a 47 y.o. male who presents with abdominal pain, N/V. PMH significant for insulin dependent DM, gastroparesis, long QT, severe ulcerative esophagitis/GERD, anxiety. He was recently in the hospital for DKA from 8/28-9/4. He states that he woke up this morning and acutely started having abdominal pain, N/V this morning at 3AM. He states his symptoms are the same when he came in to the hospital last time. He has been taking his insulin. He denies fevers. CBG this morning was 130. He tried zofran without relief. He has not been able to take Reglan suppositories because they haven't come from the pharmacy yet.  HPI  Past Medical History:  Diagnosis Date  . Chronic low back pain 11/30/2015  . Diabetic peripheral neuropathy ([place]) 11/30/2015  . Hypertension   . Neuropathy   . Type 2 diabetes mellitus [facility])     Patient Active Problem List   Diagnosis Date Noted  . Gastroesophageal reflux disease 07/17/2018  . Long QT interval 07/16/2018  . Early satiety   . Esophagitis, [place] grade D   . Hypercalcemia 06/09/2018  . SIRS (systemic inflammatory response syndrome) ([place]) 06/09/2018  . Hematemesis 06/09/2018  . Type 2 diabetes mellitus with peripheral neuropathy ([place]) 06/09/2018  . Acidosis 09/17/2016  . Dehydration 09/17/2016  . Abdominal pain 09/17/2016  . DKA (diabetic ketoacidoses) ([place]) 09/17/2016  . Nausea and vomiting 09/17/2016  . IDDM (insulin dependent diabetes mellitus) ([place])   . Gastroparesis   . Diabetic peripheral neuropathy ([place]) 11/30/2015  . Chronic low back pain 11/30/2015  . Coronary artery spasm ([place]) 08/17/2013  . Hyperlipidemia 08/17/2013  . Peripheral neuropathy 08/05/2013  . Chest pain 08/05/2013  . Fatigue 08/05/2013    . Tachycardia 08/05/2013  . DOE (dyspnea on exertion) 08/05/2013  . Diabetes mellitus, insulin dependent (IDDM), uncontrolled ([place]) 02/09/2008  . Anxiety state 02/09/2008  . Essential hypertension 02/09/2008  . ALCOHOL ABUSE, HX OF 02/09/2008  . LIVER FUNCTION TESTS, ABNORMAL, HX OF 02/09/2008  . NEOPLASM, BENIGN, ESOPHAGUS 09/16/2007  . Gastritis 09/16/2007    Past Surgical History:  Procedure Laterality Date  . back lipoma resection  03/2006  . BIOPSY  06/15/2018   Procedure: BIOPSY;  Surgeon: Jerene Bears, MD;  Location: [facility] ENDOSCOPY;  Service: Gastroenterology;;  . ESOPHAGOGASTRODUODENOSCOPY (EGD) WITH PROPOFOL N/A 06/15/2018   Procedure: ESOPHAGOGASTRODUODENOSCOPY (EGD) WITH PROPOFOL;  Surgeon: Jerene Bears, MD;  Location: [place];  Service: Gastroenterology;  Laterality: N/A;  . KNEE ARTHROSCOPY  10/2008   left  . LEFT HEART CATHETERIZATION WITH CORONARY ANGIOGRAM N/A 08/11/2013   Procedure: LEFT HEART CATHETERIZATION WITH CORONARY ANGIOGRAM;  Surgeon: Pixie Casino, MD;  Location: [facility] CATH LAB;  Service: Cardiovascular;  Laterality: N/A;  . UPPER GI ENDOSCOPY  10/2007   showed gastritis        Home Medications    Prior to Admission medications   Medication Sig Start Date End Date Taking? Authorizing Provider  acetaminophen (TYLENOL) 500 MG tablet Take 1,000 mg by mouth every 8 (eight) hours as needed for mild pain or headache.    [provider]  amLODipine (NORVASC) 10 MG tablet Take 1 tablet (10 mg total) by mouth daily. 07/31/18   Doreatha Lew, MD  hydrOXYzine (ATARAX/VISTARIL) 25  MG tablet Take 25 mg by mouth at bedtime as needed for anxiety (sleep).  07/11/18   [provider]  insulin NPH Human (HUMULIN N,NOVOLIN N) 100 UNIT/ML injection Inject 0.4-0.45 mLs (40-45 Units total) into the skin See admin instructions. Use 40 units every morning then use 45 units every evening 07/30/18   Patrecia Pour, Christean Grief, MD  insulin regular (NOVOLIN  R,HUMULIN R) 100 units/mL injection Inject 4-8 Units into the skin 3 (three) times daily before meals. Sliding scale    [provider]  metoCLOPramide (REGLAN) 10 MG tablet Take 1 tablet (10 mg total) by mouth 4 (four) times daily -  before meals and at bedtime. For 2 weeks 07/17/18   Zehr, Janett Billow D, PA-C  metoprolol succinate (TOPROL-XL) 25 MG 24 hr tablet Take 25 mg by mouth daily. 06/07/18   [provider]  pantoprazole (PROTONIX) 40 MG tablet Take 1 tablet (40 mg total) by mouth 2 (two) times daily before a meal. 07/17/18   Zehr, Janett Billow D, PA-C  PARoxetine (PAXIL) 10 MG tablet Take 10 mg by mouth 2 (two) times daily. 05/08/18   [provider]  prochlorperazine (COMPAZINE) 25 MG suppository Place 1 suppository (25 mg total) rectally every 8 (eight) hours as needed for nausea or vomiting. 07/30/18   Doreatha Lew, MD  scopolamine (TRANSDERM-SCOP) 1 MG/3DAYS Place 1 patch (1.5 mg total) onto the skin every 3 (three) days. 07/30/18   Doreatha Lew, MD  sucralfate (CARAFATE) 1 GM/10ML suspension Take 10 mLs (1 g total) by mouth 4 (four) times daily -  with meals and at bedtime. 06/17/18 07/17/18  Arrien, Jimmy Picket, MD    Family History Family History  Problem Relation Age of Onset  . Diabetes Mother   . Hypertension Mother   . Hypertension Father   . Hyperlipidemia Father   . Alcohol abuse Brother   . Brain cancer Unknown        grandparent  . Stomach cancer Unknown        grandparent    Social History Social History   Tobacco Use  . Smoking status: Current Every Day Smoker    Packs/day: 0.50    Years: 20.00    Pack years: 10.00  . Smokeless tobacco: Current User    Types: Chew  . Tobacco comment: 5-7 cigarettes/day; chews occasionally  Substance Use Topics  . Alcohol use: No  . Drug use: No     Allergies   Tylox [oxycodone-acetaminophen]   Review of Systems Review of Systems  Constitutional: Negative for fever.  Respiratory:  Negative for shortness of breath.   Cardiovascular: Negative for chest pain.  Gastrointestinal: Positive for abdominal pain, nausea and vomiting. Negative for constipation and diarrhea.  Genitourinary: Negative for difficulty urinating and dysuria.     Physical Exam Updated Vital Signs There were no vitals taken for this visit.  Physical Exam  Constitutional: He is oriented to person, place, and time. He appears well-developed and well-nourished. He appears distressed.  Distressed and anxious, tremulous  HENT:  Head: Normocephalic and atraumatic.  Eyes: Pupils are equal, round, and reactive to light. Conjunctivae are normal. Right eye exhibits no discharge. Left eye exhibits no discharge. No scleral icterus.  Neck: Normal range of motion.  Cardiovascular: Tachycardia present. Exam reveals no gallop and no friction rub.  No murmur heard. Pulmonary/Chest: Effort normal and breath sounds normal. No respiratory distress.  Abdominal: Soft. Bowel sounds are normal. He exhibits no distension and no mass. There is tenderness (  epigastric). There is no rebound and no guarding. No hernia.  Neurological: He is alert and oriented to person, place, and time.  Skin: Skin is warm and dry.  Psychiatric: He has a normal mood and affect. His behavior is normal.  Nursing note and vitals reviewed.    ED Treatments / Results  Labs (all labs ordered are listed, but only abnormal results are displayed) Labs Reviewed  CBC WITH DIFFERENTIAL/PLATELET - Abnormal; Notable for the following components:      Result Value   WBC 12.6 (*)    MCHC 36.3 (*)    Neutro Abs 10.0 (*)    All other components within normal limits  COMPREHENSIVE METABOLIC PANEL - Abnormal; Notable for the following components:   Glucose, Bld 174 (*)    Total Bilirubin 1.5 (*)    Anion gap 17 (*)    All other components within normal limits  URINALYSIS, ROUTINE W REFLEX MICROSCOPIC - Abnormal; Notable for the following components:    Glucose, UA >=500 (*)    Ketones, ur 20 (*)    All other components within normal limits  BLOOD GAS, VENOUS - Abnormal; Notable for the following components:   pH, Ven 7.533 (*)    pCO2, Ven 27.1 (*)    pO2, Ven 78.9 (*)    All other components within normal limits  LIPASE, BLOOD  RAPID URINE DRUG SCREEN, HOSP PERFORMED    EKG None  Radiology No results found.  Procedures Procedures (including critical care time)  Medications Ordered in ED Medications  scopolamine (TRANSDERM-SCOP) 1 MG/3DAYS 1.5 mg (1.5 mg Transdermal Patch Applied 08/01/18 0808)  promethazine (PHENERGAN) suppository 25 mg (25 mg Rectal Given 08/01/18 1112)  metoCLOPramide (REGLAN) injection 10 mg (has no administration in time range)  sodium chloride 0.9 % bolus 1,000 mL (1,000 mLs Intravenous New Bag/Given 08/01/18 0752)  metoCLOPramide (REGLAN) injection 10 mg (10 mg Intravenous Given 08/01/18 0752)  prochlorperazine (COMPAZINE) injection 10 mg (10 mg Intravenous Given 08/01/18 0901)  dicyclomine (BENTYL) injection 20 mg (20 mg Intramuscular Given 08/01/18 0901)  LORazepam (ATIVAN) injection 1 mg (1 mg Intravenous Given 08/01/18 1033)  sodium chloride 0.9 % bolus 1,000 mL (1,000 mLs Intravenous New Bag/Given 08/01/18 1114)     Initial Impression / Assessment and Plan / ED Course  I have reviewed the triage vital signs and the nursing notes.  Pertinent labs & imaging results that were available during my care of the patient were reviewed by me and considered in my medical decision making (see chart for details).  47 year old male presents with recurrent abdominal pain, N/V. He is distressed on initial exam. He is tachycardic and hypertensive - likely from pain and vomiting. He was able to tolerate his BP and PPI yesterday. Pt has difficult access and required PICC during last hospital stay. IV was obtained by [place]. Labs were ordered.  8:55 AM Rechecked patient. Wife is now in room. Pt is repeatedly asking for more nausea  medicine and states his symptoms are worse. Explained that we do need to be cautious since he has borderline prolonged QT. QTc today is .49. Wife is frustrated because she states that he was only discharged 2 days ago and has been "doing everything right". CBC is remarkable for mild leukocytosis. CMp is remarkable for hyperglycemia (174), elevated anion gap (17), elevated bilirubin (1.5). UA and VBG are pending.  9:56 AM VBG does not show acidosis and bicarb is normal. Suspect gastroparesis flare vs hyperemesis from marijuana use. Pt  is still feeling nauseous. Due to his prolonged QT and recent bounce back from hospitalization, will request admission for ongoing management.  12:53 PM Discussed with Dr Maurene Capes. They request abdominal xray and will talk to pt but do not think he definitely needs admission. Phenergan suppository was ordered. Xray was negative. Re-consulted hospitalist because pt is still dry heaving. He will come talk to the patient.      Final Clinical Impressions(s) / ED Diagnoses   Final diagnoses:  Intractable vomiting with nausea, unspecified vomiting type    ED Discharge Orders    None          Recardo Evangelist, PA-C 08/01/18 1255    Julianne Rice, MD 08/08/18 1529

## 2018-08-01 NOTE — Discharge Instructions (Signed)
Please try Reglan and suppositories if you have nausea/vomiting You can try a hot shower if you have intense nausea Stop smoking marijuana Please return if you are worsening

## 2018-09-22 ENCOUNTER — Other Ambulatory Visit: Payer: Self-pay

## 2018-09-22 ENCOUNTER — Encounter: Payer: Self-pay | Admitting: Internal Medicine

## 2018-09-22 ENCOUNTER — Ambulatory Visit (AMBULATORY_SURGERY_CENTER): Payer: Self-pay | Admitting: Internal Medicine

## 2018-09-22 VITALS — BP 104/60 | HR 74 | Temp 98.4°F | Resp 16 | Ht 71.0 in | Wt 233.0 lb

## 2018-09-22 DIAGNOSIS — K210 Gastro-esophageal reflux disease with esophagitis: Secondary | ICD-10-CM

## 2018-09-22 DIAGNOSIS — R112 Nausea with vomiting, unspecified: Secondary | ICD-10-CM

## 2018-09-22 DIAGNOSIS — G43A1 Cyclical vomiting, intractable: Secondary | ICD-10-CM

## 2018-09-22 MED ORDER — SODIUM CHLORIDE 0.9 % IV SOLN: 500.0000 mL | Freq: Once | INTRAVENOUS | Status: DC

## 2018-09-22 MED ORDER — METOCLOPRAMIDE HCL 10 MG PO TABS: 10.0000 mg | ORAL_TABLET | Freq: Three times a day (TID) | ORAL | 2 refills | Status: DC

## 2018-09-22 NOTE — Progress Notes (Signed)
No problems noted in the recovery room. maw 

## 2018-09-22 NOTE — Op Note (Signed)
[facility] Patient Name: Evans Levee Procedure Date: 09/22/2018 3:20 PM MRN: [id number] Endoscopist: Jerene Bears , MD Age: 47 Referring MD:  Date of Birth: [birth year] Gender: Male Account #: [id number] Procedure:                Upper GI endoscopy Indications:              Follow-up of reflux esophagitis, Nausea with                            vomiting Medicines:                Monitored Anesthesia Care Procedure:                Pre-Anesthesia Assessment:                           - Prior to the procedure, a History and Physical                            was performed, and patient medications and                            allergies were reviewed. The patient's tolerance of                            previous anesthesia was also reviewed. The risks                            and benefits of the procedure and the sedation                            options and risks were discussed with the patient.                            All questions were answered, and informed consent                            was obtained. Prior Anticoagulants: The patient has                            taken no previous anticoagulant or antiplatelet                            agents. ASA Grade Assessment: III - A patient with                            severe systemic disease. After reviewing the risks                            and benefits, the patient was deemed in                            satisfactory condition to undergo the procedure.  After obtaining informed consent, the endoscope was                            passed under direct vision. Throughout the                            procedure, the patient's blood pressure, pulse, and                            oxygen saturations were monitored continuously. The                            Model GIF-[id number] scope was introduced                            through the mouth, and advanced to the second part                             of duodenum. The upper GI endoscopy was                            accomplished without difficulty. The patient                            tolerated the procedure well. Scope In: Scope Out: Findings:                 The examined esophagus was normal. The previously                            seen severe reflux esophagitis has healed. No                            residual esophagitis was seen. The Z line is                            slightly irregular but does not extend above the                            gastric folds.                           The entire examined stomach was normal.                           The examined duodenum was normal. Complications:            No immediate complications. Estimated Blood Loss:     Estimated blood loss: none. Impression:               - Normal esophagus.                           - Normal stomach.                           - Normal examined  duodenum.                           - No specimens collected. Recommendation:           - Patient has a contact number available for                            emergencies. The signs and symptoms of potential                            delayed complications were discussed with the                            patient. Return to normal activities tomorrow.                            Written discharge instructions were provided to the                            patient.                           - Resume previous diet.                           - Continue present medications. Would reduce                            pantoprazole to 40 mg once daily. Can add                            metoclopramide 10 mg to be used 3 times a day                            before meals and at bedtime on an as-needed basis                            for nausea and vomiting episodes which are felt to                            be secondary to diabetic gastroparesis. Gastric                             emptying study was discussed but due to lack of                            medical insurance and financial constraints will                            not be pursued at this time. Jerene Bears, MD 09/22/2018 3:41:42 PM This report has been signed electronically.

## 2018-09-22 NOTE — Progress Notes (Signed)
History reviewed today 

## 2018-09-22 NOTE — Patient Instructions (Signed)
YOU HAD AN ENDOSCOPIC PROCEDURE TODAY AT [facility]:   Refer to the procedure report that was given to you for any specific questions about what was found during the examination.  If the procedure report does not answer your questions, please call your gastroenterologist to clarify.  If you requested that your care partner not be given the details of your procedure findings, then the procedure report has been included in a sealed envelope for you to review at your convenience later.  YOU SHOULD EXPECT: Some feelings of bloating in the abdomen. Passage of more gas than usual.  Walking can help get rid of the air that was put into your GI tract during the procedure and reduce the bloating. If you had a lower endoscopy (such as a colonoscopy or flexible sigmoidoscopy) you may notice spotting of blood in your stool or on the toilet paper. If you underwent a bowel prep for your procedure, you may not have a normal bowel movement for a few days.  Please Note:  You might notice some irritation and congestion in your nose or some drainage.  This is from the oxygen used during your procedure.  There is no need for concern and it should clear up in a day or so.  SYMPTOMS TO REPORT IMMEDIATELY:    Following upper endoscopy (EGD)  Vomiting of blood or coffee ground material  New chest pain or pain under the shoulder blades  Painful or persistently difficult swallowing  New shortness of breath  Fever of 100F or higher  Black, tarry-looking stools  For urgent or emergent issues, a gastroenterologist can be reached at any hour by calling [phone].   DIET:  We do recommend a small meal at first, but then you may proceed to your regular diet. A copy of the Gastroparesis diet was given to your care partner.   Drink plenty of fluids but you should avoid alcoholic beverages for 24 hours.  ACTIVITY:  You should plan to take it easy for the rest of today and you should NOT DRIVE or use  heavy machinery until tomorrow (because of the sedation medicines used during the test).    FOLLOW UP: Our staff will call the number listed on your records the next business day following your procedure to check on you and address any questions or concerns that you may have regarding the information given to you following your procedure. If we do not reach you, we will leave a message.  However, if you are feeling well and you are not experiencing any problems, there is no need to return our call.  We will assume that you have returned to your regular daily activities without incident.  If any biopsies were taken you will be contacted by phone or by letter within the next 1-3 weeks.  Please call us at [phone] if you have not heard about the biopsies in 3 weeks.    SIGNATURES/CONFIDENTIALITY: You and/or your care partner have signed paperwork which will be entered into your electronic medical record.  These signatures attest to the fact that that the information above on your After Visit Summary has been reviewed and is understood.  Full responsibility of the confidentiality of this discharge information lies with you and/or your care-partner.   Handout was given to your care partner on The Gastroparesis Diet. Your blood sugar was 209 in the recovery room. Per Dr. [name] PANTOPRAZOLE 40 to once every am. Rx for METOCLOPRAMIDE 10 mg three times  per day.  Before meals and at bedtime on a as needed basis for nausea ans vomiting. You may resume your other current medications today. Please call if any questions or concerns.

## 2018-09-23 ENCOUNTER — Telehealth: Payer: Self-pay

## 2018-09-23 ENCOUNTER — Telehealth: Payer: Self-pay | Admitting: *Deleted

## 2018-09-23 NOTE — Telephone Encounter (Signed)
Attempted to reach patient for post-procedure f/u call. No answer. Left message that we will make another attempt to reach him again later today and for him to please not hesitate to call us if he has any concerns regarding his care.

## 2018-09-23 NOTE — Telephone Encounter (Signed)
No answer, second call.  Left message to call if questions or concerns. 

## 2019-04-16 DIAGNOSIS — F1020 Alcohol dependence, uncomplicated: Secondary | ICD-10-CM | POA: Insufficient documentation

## 2019-04-16 DIAGNOSIS — F1024 Alcohol dependence with alcohol-induced mood disorder: Secondary | ICD-10-CM | POA: Insufficient documentation

## 2019-11-25 ENCOUNTER — Ambulatory Visit: Payer: HRSA Program | Attending: Internal Medicine

## 2019-11-25 DIAGNOSIS — Z20822 Contact with and (suspected) exposure to covid-19: Secondary | ICD-10-CM

## 2019-11-25 DIAGNOSIS — Z20828 Contact with and (suspected) exposure to other viral communicable diseases: Secondary | ICD-10-CM | POA: Diagnosis not present

## 2019-11-26 LAB — NOVEL CORONAVIRUS, NAA: SARS-CoV-2, NAA: NOT DETECTED

## 2019-12-08 ENCOUNTER — Ambulatory Visit: Payer: HRSA Program | Attending: Internal Medicine

## 2019-12-08 DIAGNOSIS — Z20822 Contact with and (suspected) exposure to covid-19: Secondary | ICD-10-CM | POA: Diagnosis not present

## 2019-12-10 LAB — NOVEL CORONAVIRUS, NAA: SARS-CoV-2, NAA: NOT DETECTED

## 2020-02-29 ENCOUNTER — Ambulatory Visit: Payer: Self-pay | Attending: Internal Medicine

## 2020-02-29 ENCOUNTER — Other Ambulatory Visit: Payer: Self-pay

## 2020-02-29 DIAGNOSIS — Z23 Encounter for immunization: Secondary | ICD-10-CM

## 2020-02-29 NOTE — Progress Notes (Signed)
   Covid-19 Vaccination Clinic  Name:  Grant [name]    MRN: [id number] DOB: January 13, 1971  02/29/2020  [name] was observed post Covid-19 immunization for 15 minutes without incident. He was provided with Vaccine Information Sheet and instruction to access the V-Safe system.   [name] was instructed to call 911 with any severe reactions post vaccine: Marland Kitchen Difficulty breathing  . Swelling of face and throat  . A fast heartbeat  . A bad rash all over body  . Dizziness and weakness   Immunizations Administered    Name Date Dose VIS Date Route   Pfizer COVID-19 Vaccine 02/29/2020  2:01 PM 0.3 mL 11/06/2019 Intramuscular   Manufacturer: [place]   Lot: [id number]   [place]: [id number]

## 2020-03-23 ENCOUNTER — Other Ambulatory Visit: Payer: Self-pay

## 2020-03-23 ENCOUNTER — Ambulatory Visit: Payer: Self-pay | Attending: Internal Medicine

## 2020-03-23 DIAGNOSIS — Z23 Encounter for immunization: Secondary | ICD-10-CM

## 2020-03-23 NOTE — Progress Notes (Signed)
   Covid-19 Vaccination Clinic  Name:  Grant [name]    MRN: [id number] DOB: 1971-07-08  03/23/2020  [name] was observed post Covid-19 immunization for 15 minutes without incident. He was provided with Vaccine Information Sheet and instruction to access the V-Safe system.   [name] was instructed to call 911 with any severe reactions post vaccine: Marland Kitchen Difficulty breathing  . Swelling of face and throat  . A fast heartbeat  . A bad rash all over body  . Dizziness and weakness   Immunizations Administered    Name Date Dose VIS Date [place] COVID-19 Vaccine 03/23/2020 12:27 PM 0.3 mL 01/20/2019 Intramuscular   Manufacturer: [place]   Lot: [id number]   [place]: [id number]

## 2020-04-22 IMAGING — CT CT HEAD W/O CM
4 series · 16 of 47 positions shown, 18 images · non-contrast
Comparison: None.

CLINICAL DATA: 47-year-old male with altered mental status.

EXAM:
CT HEAD WITHOUT CONTRAST
TECHNIQUE: Contiguous axial images were obtained from the base of the skull
through the vertex without intravenous contrast.

[Series 3: head without · axial · non-contrast · 0.51mm/px · z∈[-118,+2]mm · 7 of 34 slices shown, 9 images]
[im 5/34  brain]
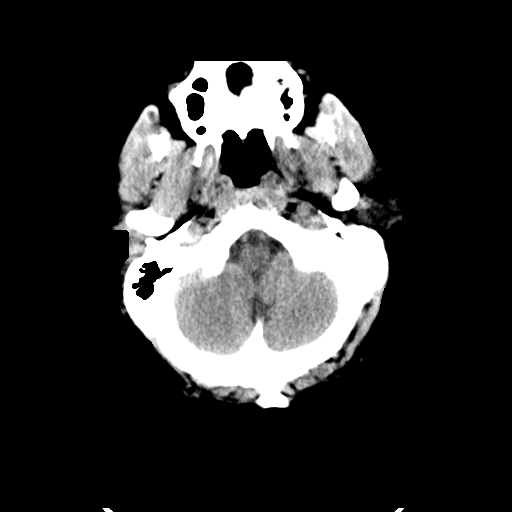
[im 5/34  bone]
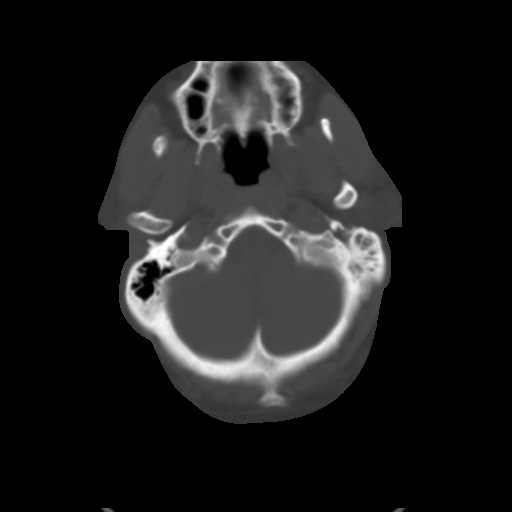
[im 9/34  brain]
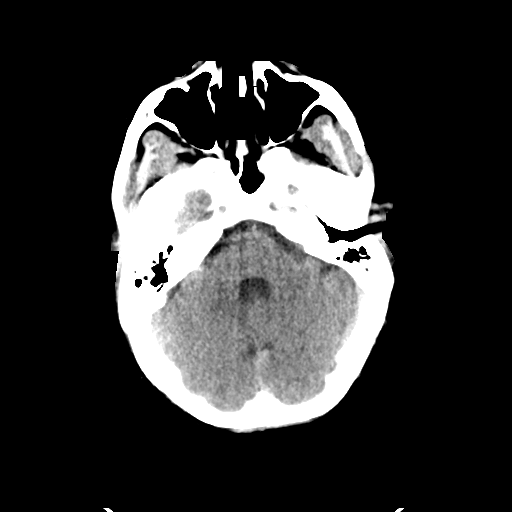
[im 13/34  brain]
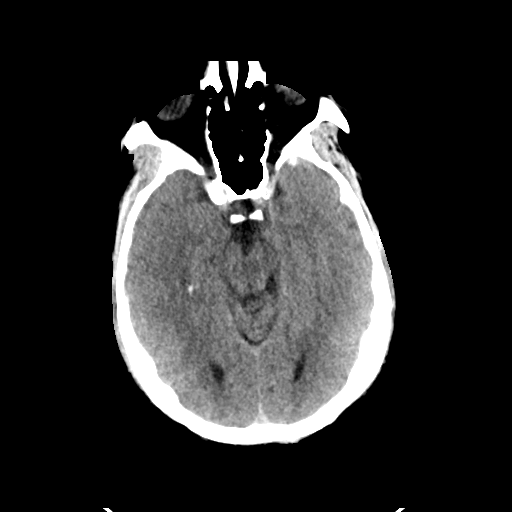
[im 17/34  brain]
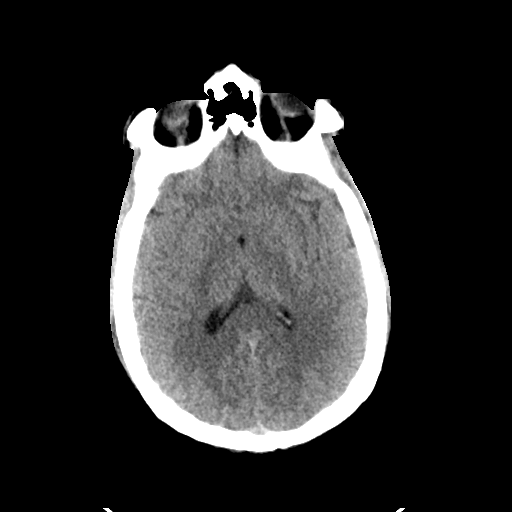
[im 21/34  brain]
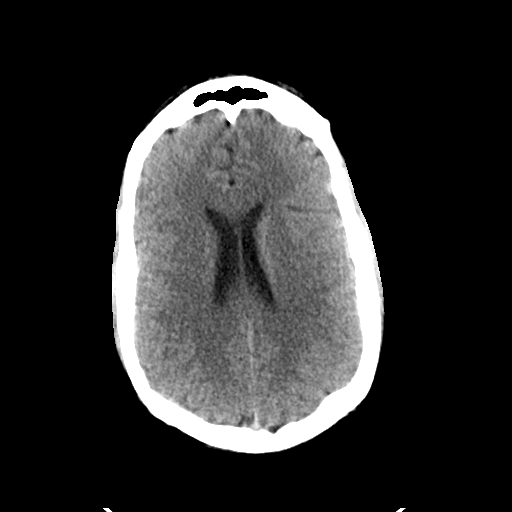
[im 21/34  bone]
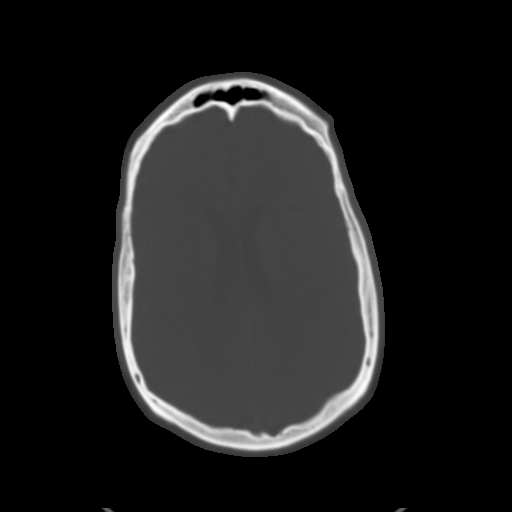
[im 25/34  brain]
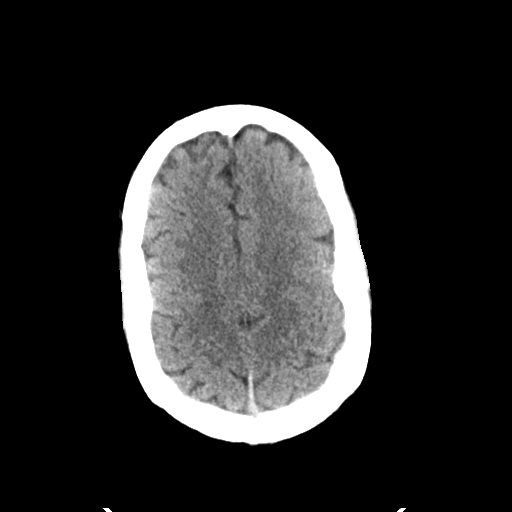
[im 29/34  brain]
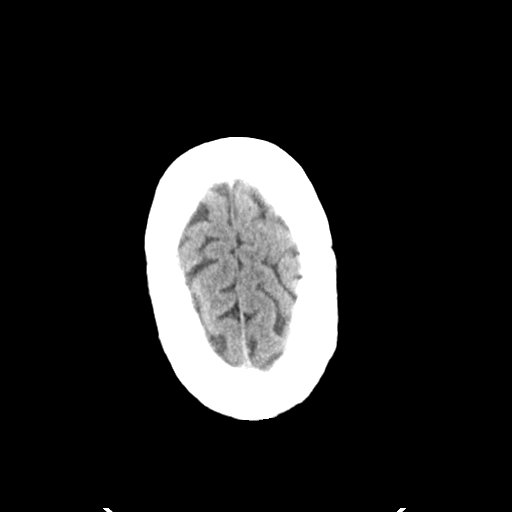

[Series 4: head bone · axial · 0.51mm/px · z∈[-122,-88]mm · 3 of 85 slices shown]
[im 9/85  bone]
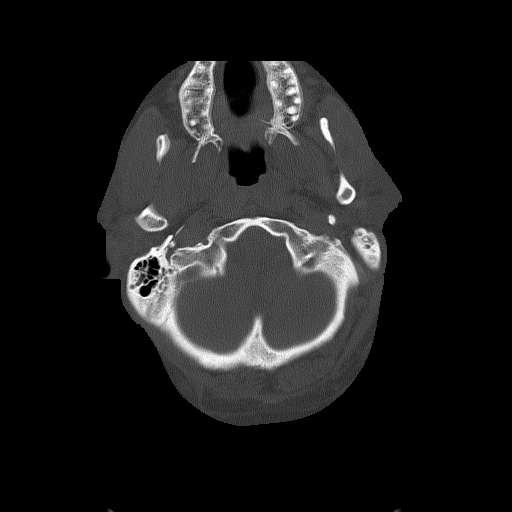
[im 17/85  bone]
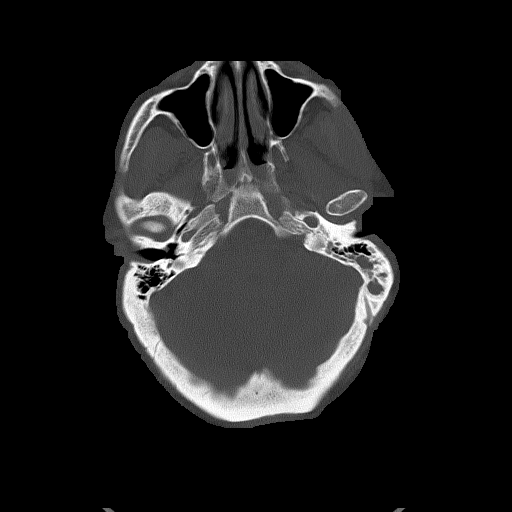
[im 26/85  bone]
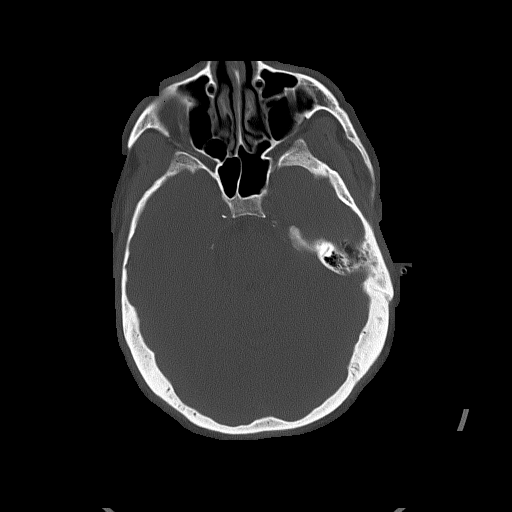

[Series 5: head without cor · coronal · non-contrast · 0.35mm/px · 3 of 73 slices shown]
[im 25/73  brain]
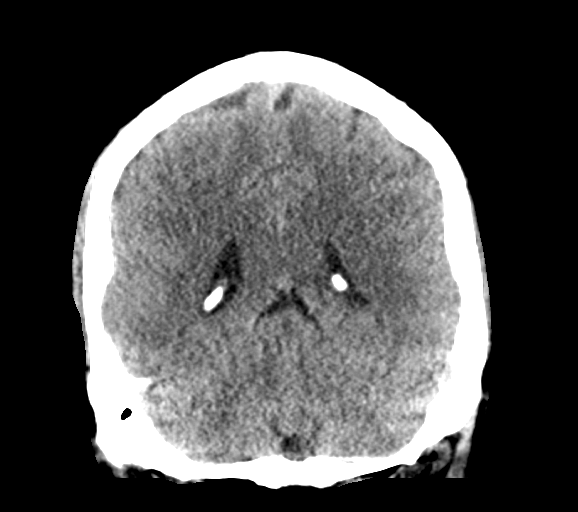
[im 33/73  brain]
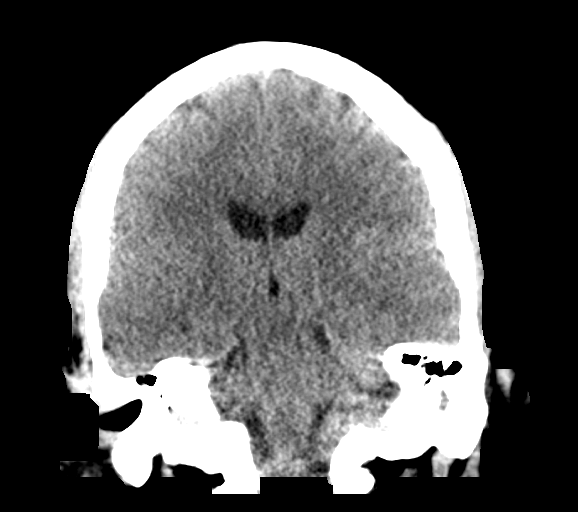
[im 41/73  brain]
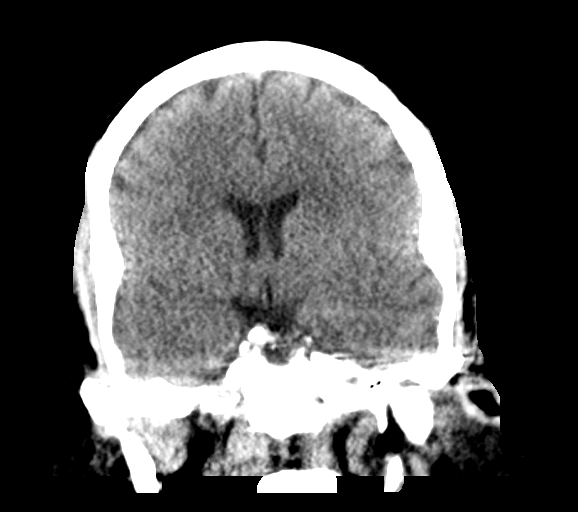

[Series 6: head without sag · sagittal · non-contrast · 0.33mm/px · 3 of 65 slices shown]
[im 22/65  brain]
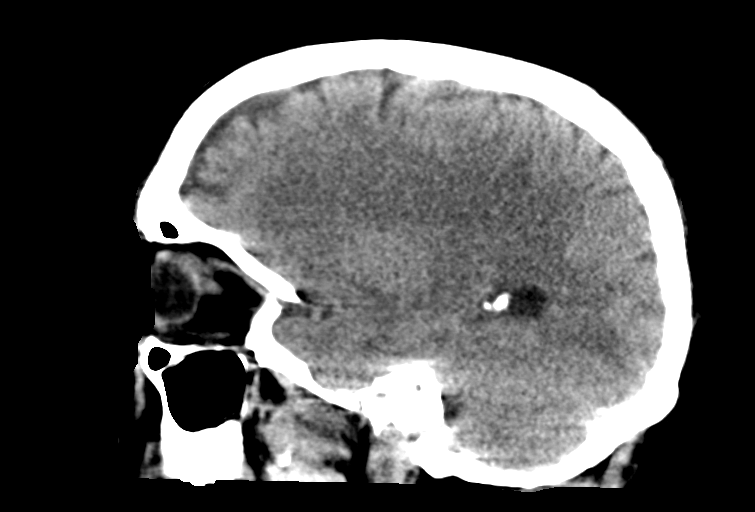
[im 33/65  brain]
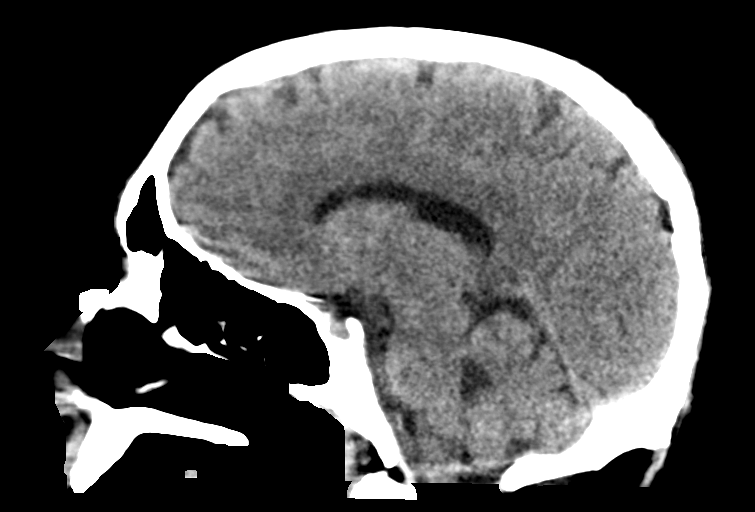
[im 43/65  brain]
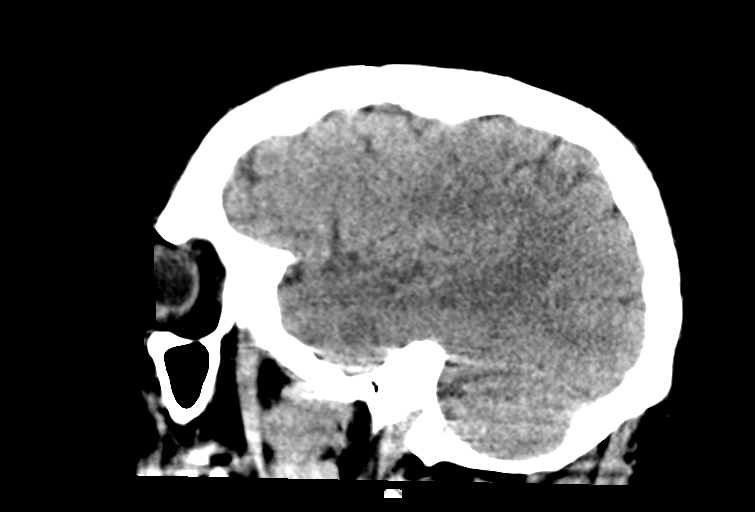

[16 of 47 positions shown; findings below may reference images not displayed]

FINDINGS: Brain: There is close and sulci appropriate size for patient's age.
The gray-white matter discrimination is preserved. There is no acute
intracranial hemorrhage. No mass effect or midline shift. No
extra-axial fluid collection.

Vascular: No hyperdense vessel or unexpected calcification.

Skull: Normal. Negative for fracture or focal lesion.

Sinuses/Orbits: The visualized paranasal sinuses and the right
mastoid air cells are clear. Left mastoid effusion.

Other: None
IMPRESSION: 1. Unremarkable noncontrast CT of the brain.
2. Left mastoid effusions.

## 2020-05-03 DIAGNOSIS — E1140 Type 2 diabetes mellitus with diabetic neuropathy, unspecified: Secondary | ICD-10-CM | POA: Diagnosis not present

## 2020-05-03 DIAGNOSIS — E785 Hyperlipidemia, unspecified: Secondary | ICD-10-CM | POA: Diagnosis not present

## 2020-05-03 DIAGNOSIS — I10 Essential (primary) hypertension: Secondary | ICD-10-CM | POA: Diagnosis not present

## 2020-05-03 DIAGNOSIS — I2510 Atherosclerotic heart disease of native coronary artery without angina pectoris: Secondary | ICD-10-CM | POA: Diagnosis not present

## 2020-05-03 DIAGNOSIS — G629 Polyneuropathy, unspecified: Secondary | ICD-10-CM | POA: Diagnosis not present

## 2020-05-03 DIAGNOSIS — I739 Peripheral vascular disease, unspecified: Secondary | ICD-10-CM | POA: Diagnosis not present

## 2020-05-03 DIAGNOSIS — M545 Low back pain: Secondary | ICD-10-CM | POA: Diagnosis not present

## 2020-05-03 DIAGNOSIS — Z1331 Encounter for screening for depression: Secondary | ICD-10-CM | POA: Diagnosis not present

## 2020-06-14 DIAGNOSIS — E113293 Type 2 diabetes mellitus with mild nonproliferative diabetic retinopathy without macular edema, bilateral: Secondary | ICD-10-CM | POA: Diagnosis not present

## 2020-06-16 DIAGNOSIS — I10 Essential (primary) hypertension: Secondary | ICD-10-CM | POA: Diagnosis not present

## 2020-06-16 DIAGNOSIS — F1011 Alcohol abuse, in remission: Secondary | ICD-10-CM | POA: Diagnosis not present

## 2020-06-16 DIAGNOSIS — E7849 Other hyperlipidemia: Secondary | ICD-10-CM | POA: Diagnosis not present

## 2020-06-16 DIAGNOSIS — E119 Type 2 diabetes mellitus without complications: Secondary | ICD-10-CM | POA: Diagnosis not present

## 2020-06-25 DIAGNOSIS — Z20822 Contact with and (suspected) exposure to covid-19: Secondary | ICD-10-CM | POA: Diagnosis not present

## 2020-06-27 DIAGNOSIS — H2513 Age-related nuclear cataract, bilateral: Secondary | ICD-10-CM | POA: Diagnosis not present

## 2020-06-27 DIAGNOSIS — E113213 Type 2 diabetes mellitus with mild nonproliferative diabetic retinopathy with macular edema, bilateral: Secondary | ICD-10-CM | POA: Diagnosis not present

## 2020-06-27 DIAGNOSIS — H43823 Vitreomacular adhesion, bilateral: Secondary | ICD-10-CM | POA: Diagnosis not present

## 2020-07-28 DIAGNOSIS — E1140 Type 2 diabetes mellitus with diabetic neuropathy, unspecified: Secondary | ICD-10-CM | POA: Diagnosis not present

## 2020-07-28 DIAGNOSIS — I2510 Atherosclerotic heart disease of native coronary artery without angina pectoris: Secondary | ICD-10-CM | POA: Diagnosis not present

## 2020-07-28 DIAGNOSIS — I10 Essential (primary) hypertension: Secondary | ICD-10-CM | POA: Diagnosis not present

## 2020-08-10 DIAGNOSIS — R070 Pain in throat: Secondary | ICD-10-CM | POA: Diagnosis not present

## 2020-08-10 DIAGNOSIS — J3489 Other specified disorders of nose and nasal sinuses: Secondary | ICD-10-CM | POA: Diagnosis not present

## 2020-08-10 DIAGNOSIS — J029 Acute pharyngitis, unspecified: Secondary | ICD-10-CM | POA: Diagnosis not present

## 2020-08-10 DIAGNOSIS — Z20828 Contact with and (suspected) exposure to other viral communicable diseases: Secondary | ICD-10-CM | POA: Diagnosis not present

## 2020-08-10 DIAGNOSIS — R0981 Nasal congestion: Secondary | ICD-10-CM | POA: Diagnosis not present

## 2020-08-15 DIAGNOSIS — J019 Acute sinusitis, unspecified: Secondary | ICD-10-CM | POA: Diagnosis not present

## 2020-08-15 DIAGNOSIS — R509 Fever, unspecified: Secondary | ICD-10-CM | POA: Diagnosis not present

## 2020-08-15 DIAGNOSIS — J111 Influenza due to unidentified influenza virus with other respiratory manifestations: Secondary | ICD-10-CM | POA: Diagnosis not present

## 2020-08-15 DIAGNOSIS — Z1152 Encounter for screening for COVID-19: Secondary | ICD-10-CM | POA: Diagnosis not present

## 2020-08-15 DIAGNOSIS — R110 Nausea: Secondary | ICD-10-CM | POA: Diagnosis not present

## 2020-08-15 DIAGNOSIS — I10 Essential (primary) hypertension: Secondary | ICD-10-CM | POA: Diagnosis not present

## 2020-08-15 DIAGNOSIS — R05 Cough: Secondary | ICD-10-CM | POA: Diagnosis not present

## 2020-09-06 DIAGNOSIS — Z87891 Personal history of nicotine dependence: Secondary | ICD-10-CM | POA: Diagnosis not present

## 2020-09-06 DIAGNOSIS — I10 Essential (primary) hypertension: Secondary | ICD-10-CM | POA: Diagnosis not present

## 2020-09-06 DIAGNOSIS — F1011 Alcohol abuse, in remission: Secondary | ICD-10-CM | POA: Diagnosis not present

## 2020-09-06 DIAGNOSIS — Z23 Encounter for immunization: Secondary | ICD-10-CM | POA: Diagnosis not present

## 2020-09-06 DIAGNOSIS — D751 Secondary polycythemia: Secondary | ICD-10-CM | POA: Diagnosis not present

## 2020-09-06 DIAGNOSIS — I739 Peripheral vascular disease, unspecified: Secondary | ICD-10-CM | POA: Diagnosis not present

## 2020-09-06 DIAGNOSIS — E1140 Type 2 diabetes mellitus with diabetic neuropathy, unspecified: Secondary | ICD-10-CM | POA: Diagnosis not present

## 2020-09-06 DIAGNOSIS — E785 Hyperlipidemia, unspecified: Secondary | ICD-10-CM | POA: Diagnosis not present

## 2020-09-06 DIAGNOSIS — I2510 Atherosclerotic heart disease of native coronary artery without angina pectoris: Secondary | ICD-10-CM | POA: Diagnosis not present

## 2020-09-15 DIAGNOSIS — E1140 Type 2 diabetes mellitus with diabetic neuropathy, unspecified: Secondary | ICD-10-CM | POA: Diagnosis not present

## 2020-09-15 DIAGNOSIS — L03119 Cellulitis of unspecified part of limb: Secondary | ICD-10-CM | POA: Diagnosis not present

## 2020-09-15 DIAGNOSIS — L97519 Non-pressure chronic ulcer of other part of right foot with unspecified severity: Secondary | ICD-10-CM | POA: Diagnosis not present

## 2020-09-15 DIAGNOSIS — E11621 Type 2 diabetes mellitus with foot ulcer: Secondary | ICD-10-CM | POA: Diagnosis not present

## 2020-09-15 DIAGNOSIS — G629 Polyneuropathy, unspecified: Secondary | ICD-10-CM | POA: Diagnosis not present

## 2020-09-21 ENCOUNTER — Ambulatory Visit (INDEPENDENT_AMBULATORY_CARE_PROVIDER_SITE_OTHER): Payer: Medicare HMO

## 2020-09-21 ENCOUNTER — Encounter: Payer: Self-pay | Admitting: Family

## 2020-09-21 ENCOUNTER — Other Ambulatory Visit: Payer: Self-pay

## 2020-09-21 ENCOUNTER — Ambulatory Visit (INDEPENDENT_AMBULATORY_CARE_PROVIDER_SITE_OTHER): Payer: Medicare HMO | Admitting: Family

## 2020-09-21 DIAGNOSIS — L97511 Non-pressure chronic ulcer of other part of right foot limited to breakdown of skin: Secondary | ICD-10-CM

## 2020-09-21 DIAGNOSIS — E1140 Type 2 diabetes mellitus with diabetic neuropathy, unspecified: Secondary | ICD-10-CM | POA: Diagnosis not present

## 2020-09-21 DIAGNOSIS — L97519 Non-pressure chronic ulcer of other part of right foot with unspecified severity: Secondary | ICD-10-CM | POA: Diagnosis not present

## 2020-09-21 DIAGNOSIS — E11621 Type 2 diabetes mellitus with foot ulcer: Secondary | ICD-10-CM | POA: Diagnosis not present

## 2020-09-28 NOTE — Progress Notes (Signed)
Office Visit Note   Patient: Grant [name]           Date of Birth: Apr 26, 1971           MRN: [id number] Visit Date: 09/21/2020              Requested by: [name], MD [place],  [place] [address] PCP: [name], MD  No chief complaint on file.     HPI: The patient is a 49 year old gentleman seen today for initial evaluation of right foot wound.  States he is a diabetic does have issues with insensate neuropathy he first noticed this wound to his right foot about a week ago it is beneath the fifth metatarsal head.  He has seen his primary care provider Dr. Sharlett Iles in the last week.  Was placed on doxycycline has been full weightbearing in regular shoewear doing dry dressing changes.  Of note his A1c is now down to a 7 he quit smoking about 5 years ago  He is quite concerned for diabetic foot ulcer  Assessment & Plan: Visit Diagnoses:  1. Right foot ulcer, limited to breakdown of skin ([place])     Plan: Reassurance provided radiographs negative for osteomyelitis.  The wound appears to be quite superficial we will treat this with mupirocin dressing changes provided doughnuts for felt pressure relief in his shoe wear.  We will take him out of work for 2 weeks to offload the foot   Discussed heel cord stretching  Follow-Up Instructions: Return in about 2 weeks (around 10/05/2020).   Ortho Exam  Patient is alert, oriented, no adenopathy, well-dressed, normal affect, normal respiratory effort. On examination of the right foot there is ulceration to the plantar aspect of the fifth metatarsal head this is 1 cm in diameter about 2 mm deep this does not probe to bone or tendon there is full granulation in the wound bed the surrounding tissue was debrided with a 10 blade knife of nonviable tissue he has some callus buildup.  No drainage no surrounding erythema no odor no warmth  Imaging: No results found. No images are attached to the  encounter.  Labs: Lab Results  Component Value Date   HGBA1C 9.4 (H) 06/09/2018   ESRSEDRATE 2 11/30/2015   REPTSTATUS 06/14/2018 FINAL 06/09/2018   REPTSTATUS 06/14/2018 FINAL 06/09/2018   CULT  06/09/2018    NO GROWTH 5 DAYS Performed at [facility], [place] [address], [place], [place] [address]    CULT  06/09/2018    NO GROWTH 5 DAYS Performed at [facility] [address], [place], [place] [address]      Lab Results  Component Value Date   ALBUMIN 4.5 08/01/2018   ALBUMIN 3.7 07/28/2018   ALBUMIN 4.2 07/27/2018    Lab Results  Component Value Date   MG 2.0 07/30/2018   MG 2.0 07/28/2018   MG 1.8 07/27/2018   No results found for: VD25OH  No results found for: PREALBUMIN CBC EXTENDED Latest Ref Rng & Units 08/01/2018 07/30/2018 07/28/2018  WBC 4.0 - 10.5 K/uL 12.6(H) 8.1 10.4  RBC 4.22 - 5.81 MIL/uL 5.23 4.44 4.66  HGB 13.0 - 17.0 g/dL 15.9 13.2 14.0  HCT 39 - 52 % 43.8 38.4(L) 40.5  PLT 150 - 400 K/uL 222 175 207  NEUTROABS 1.7 - 7.7 K/uL 10.0(H) - 8.1(H)  LYMPHSABS 0.7 - 4.0 K/uL 1.5 - 1.5     There is no height or weight  on file to calculate BMI.  Orders:  Orders Placed This Encounter  Procedures  . XR Foot 2 Views Right   No orders of the defined types were placed in this encounter.    Procedures: No procedures performed  Clinical Data: No additional findings.  ROS:  All other systems negative, except as noted in the HPI. Review of Systems  Constitutional: Negative for chills and fever.  Cardiovascular: Negative for leg swelling.  Skin: Positive for wound. Negative for color change.    Objective: Vital Signs: There were no vitals taken for this visit.  Specialty Comments:  No specialty comments available.  PMFS History: Patient Active Problem List   Diagnosis Date Noted  . Cannabis hyperemesis syndrome concurrent with and due to cannabis abuse ([place]) 08/01/2018  . Gastroesophageal reflux disease 07/17/2018  . Long QT  interval 07/16/2018  . Early satiety   . Esophagitis, [place] grade D   . Hypercalcemia 06/09/2018  . SIRS (systemic inflammatory response syndrome) ([place]) 06/09/2018  . Hematemesis 06/09/2018  . Type 2 diabetes mellitus with peripheral neuropathy ([place]) 06/09/2018  . Acidosis 09/17/2016  . Dehydration 09/17/2016  . Abdominal pain 09/17/2016  . DKA (diabetic ketoacidoses) 09/17/2016  . Nausea and vomiting 09/17/2016  . IDDM (insulin dependent diabetes mellitus)   . Gastroparesis   . Diabetic peripheral neuropathy ([place]) 11/30/2015  . Chronic low back pain 11/30/2015  . Coronary artery spasm ([place]) 08/17/2013  . Hyperlipidemia 08/17/2013  . Peripheral neuropathy 08/05/2013  . Chest pain 08/05/2013  . Fatigue 08/05/2013  . Tachycardia 08/05/2013  . DOE (dyspnea on exertion) 08/05/2013  . Diabetes mellitus, insulin dependent (IDDM), uncontrolled 02/09/2008  . Anxiety state 02/09/2008  . Essential hypertension 02/09/2008  . ALCOHOL ABUSE, HX OF 02/09/2008  . LIVER FUNCTION TESTS, ABNORMAL, HX OF 02/09/2008  . NEOPLASM, BENIGN, ESOPHAGUS 09/16/2007  . Gastritis 09/16/2007   Past Medical History:  Diagnosis Date  . Chronic low back pain 11/30/2015  . Diabetic peripheral neuropathy ([place]) 11/30/2015  . Hypertension   . Neuropathy   . Type 2 diabetes mellitus ([facility])     Family History  Problem Relation Age of Onset  . Diabetes Mother   . Hypertension Mother   . Hypertension Father   . Hyperlipidemia Father   . Alcohol abuse Brother   . Brain cancer Unknown        grandparent  . Stomach cancer Unknown        grandparent  . Stomach cancer Paternal Grandfather   . Colon cancer Neg Hx   . Esophageal cancer Neg Hx   . Rectal cancer Neg Hx     Past Surgical History:  Procedure Laterality Date  . back lipoma resection  03/2006  . BIOPSY  06/15/2018   Procedure: BIOPSY;  Surgeon: Jerene Bears, MD;  Location: [facility] END[facility]COPY;  Service: Gastroenterology;;  . ESOPHAGOGASTRODUODENOSCOPY  (EGD) WITH PROPOFOL N/A 06/15/2018   Procedure: ESOPHAGOGASTRODUODENOSCOPY (EGD) WITH PROPOFOL;  Surgeon: Jerene Bears, MD;  Location: [facility];  Service: Gastroenterology;  Laterality: N/A;  . KNEE ARTHROSCOPY  10/2008   left  . LEFT HEART CATHETERIZATION WITH CORONARY ANGIOGRAM N/A 08/11/2013   Procedure: LEFT HEART CATHETERIZATION WITH CORONARY ANGIOGRAM;  Surgeon: Pixie Casino, MD;  Location: [facility] CATH LAB;  Service: Cardiovascular;  Laterality: N/A;  . UPPER GI ENDOSCOPY  10/2007   showed gastritis   Social History   Occupational History  . Occupation: disability  Tobacco Use  . Smoking status: Former Smoker    Packs/day:  0.50    Years: 20.00    Pack years: 10.00    Quit date: 03/23/2016    Years since quitting: 4.5  . Smokeless tobacco: Current User    Types: Chew  . Tobacco comment:  chews occasionally  Vaping Use  . Vaping Use: Never used  Substance and Sexual Activity  . Alcohol use: No  . Drug use: No  . Sexual activity: Not on file

## 2020-10-05 ENCOUNTER — Encounter: Payer: Self-pay | Admitting: Family

## 2020-10-05 ENCOUNTER — Ambulatory Visit: Payer: Medicare HMO | Admitting: Family

## 2020-10-05 VITALS — Ht 71.0 in | Wt 233.0 lb

## 2020-10-05 DIAGNOSIS — E1142 Type 2 diabetes mellitus with diabetic polyneuropathy: Secondary | ICD-10-CM

## 2020-10-05 DIAGNOSIS — L97511 Non-pressure chronic ulcer of other part of right foot limited to breakdown of skin: Secondary | ICD-10-CM

## 2020-10-05 NOTE — Progress Notes (Signed)
Office Visit Note   Patient: Grant [name]           Date of Birth: 07-Apr-1971           MRN: [id number] Visit Date: 10/05/2020              Requested by: [name], [employer] [place],  [place] [address] PCP: [name], MD  Chief Complaint  Patient presents with  . Right Foot - Follow-up      HPI: The patient is a 49 year old gentleman seen today in follow up for wagner grade 1 ulcer of right foot.   The patient is diabetic with insensate neuropathy has had wound now for nearly a month did complete a course of doxycycline.  He has been doing mupirocin dressing changes minimizing his weightbearing has been out of work for the last 2 weeks is using felt pressure relieving pads in his shoe wear   Of note his A1c is now down to a 7 he quit smoking about 5 years ago    Assessment & Plan: Visit Diagnoses:  1. Right foot ulcer, limited to breakdown of skin ([place])   2. Type 2 diabetes mellitus with peripheral neuropathy (HCC)     Plan: He will continue with daily dressing changes, provided prisma for next 2 weeks. continue doughnuts for felt pressure relief in his shoe wear.  We will take him out of work for 2 weeks to offload the foot   Discussed heel cord stretching  Follow-Up Instructions: No follow-ups on file.   Ortho Exam  Patient is alert, oriented, no adenopathy, well-dressed, normal affect, normal respiratory effort. On examination of the right foot there is ulceration to the plantar aspect of the fifth metatarsal head this is 1 cm in diameter about 1 mm deep this does not probe to bone or tendon there is full granulation in the wound bed the surrounding tissue was debrided with a 10 blade knife of nonviable tissue, callus debrided. No drainage no surrounding erythema no odor no warmth  Imaging: No results found. No images are attached to the encounter.  Labs: Lab Results  Component Value Date   HGBA1C 9.4 (H) 06/09/2018   ESRSEDRATE 2  11/30/2015   REPTSTATUS 06/14/2018 FINAL 06/09/2018   REPTSTATUS 06/14/2018 FINAL 06/09/2018   CULT  06/09/2018    NO GROWTH 5 DAYS Performed at [facility], [place] [address], [place], [place] [address]    CULT  06/09/2018    NO GROWTH 5 DAYS Performed at [facility] [address] [place], [place] [address]      Lab Results  Component Value Date   ALBUMIN 4.5 08/01/2018   ALBUMIN 3.7 07/28/2018   ALBUMIN 4.2 07/27/2018    Lab Results  Component Value Date   MG 2.0 07/30/2018   MG 2.0 07/28/2018   MG 1.8 07/27/2018   No results found for: VD25OH  No results found for: PREALBUMIN CBC EXTENDED Latest Ref Rng & Units 08/01/2018 07/30/2018 07/28/2018  WBC 4.0 - 10.5 K/uL 12.6(H) 8.1 10.4  RBC 4.22 - 5.81 MIL/uL 5.23 4.44 4.66  HGB 13.0 - 17.0 g/dL 15.9 13.2 14.0  HCT 39 - 52 % 43.8 38.4(L) 40.5  PLT 150 - 400 K/uL 222 175 207  NEUTROABS 1.7 - 7.7 K/uL 10.0(H) - 8.1(H)  LYMPHSABS 0.7 - 4.0 K/uL 1.5 - 1.5     Body mass index is 32.5 kg/m.  Orders:  No orders of the  defined types were placed in this encounter.  No orders of the defined types were placed in this encounter.    Procedures: No procedures performed  Clinical Data: No additional findings.  ROS:  All other systems negative, except as noted in the HPI. Review of Systems  Constitutional: Negative for chills and fever.  Cardiovascular: Negative for leg swelling.  Skin: Positive for wound. Negative for color change.    Objective: Vital Signs: Ht 5\' 11"  (1.803 m)   Wt 233 lb (105.7 kg)   BMI 32.50 kg/m   Specialty Comments:  No specialty comments available.  PMFS History: Patient Active Problem List   Diagnosis Date Noted  . Cannabis hyperemesis syndrome concurrent with and due to cannabis abuse ([place]) 08/01/2018  . Gastroesophageal reflux disease 07/17/2018  . Long QT interval 07/16/2018  . Early satiety   . Esophagitis, [place] grade D   . Hypercalcemia 06/09/2018  . SIRS  (systemic inflammatory response syndrome) ([place]) 06/09/2018  . Hematemesis 06/09/2018  . Type 2 diabetes mellitus with peripheral neuropathy ([place]) 06/09/2018  . Acidosis 09/17/2016  . Dehydration 09/17/2016  . Abdominal pain 09/17/2016  . DKA (diabetic ketoacidoses) 09/17/2016  . Nausea and vomiting 09/17/2016  . IDDM (insulin dependent diabetes mellitus)   . Gastroparesis   . Diabetic peripheral neuropathy ([place]) 11/30/2015  . Chronic low back pain 11/30/2015  . Coronary artery spasm ([place]) 08/17/2013  . Hyperlipidemia 08/17/2013  . Peripheral neuropathy 08/05/2013  . Chest pain 08/05/2013  . Fatigue 08/05/2013  . Tachycardia 08/05/2013  . DOE (dyspnea on exertion) 08/05/2013  . Diabetes mellitus, insulin dependent (IDDM), uncontrolled 02/09/2008  . Anxiety state 02/09/2008  . Essential hypertension 02/09/2008  . ALCOHOL ABUSE, HX OF 02/09/2008  . LIVER FUNCTION TESTS, ABNORMAL, HX OF 02/09/2008  . NEOPLASM, BENIGN, ESOPHAGUS 09/16/2007  . Gastritis 09/16/2007   Past Medical History:  Diagnosis Date  . Chronic low back pain 11/30/2015  . Diabetic peripheral neuropathy ([place]) 11/30/2015  . Hypertension   . Neuropathy   . Type 2 diabetes mellitus (HCC)     Family History  Problem Relation Age of Onset  . Diabetes Mother   . Hypertension Mother   . Hypertension Father   . Hyperlipidemia Father   . Alcohol abuse Brother   . Brain cancer Unknown        grandparent  . Stomach cancer Unknown        grandparent  . Stomach cancer Paternal Grandfather   . Colon cancer Neg Hx   . Esophageal cancer Neg Hx   . Rectal cancer Neg Hx     Past Surgical History:  Procedure Laterality Date  . back lipoma resection  03/2006  . BIOPSY  06/15/2018   Procedure: BIOPSY;  Surgeon: Jerene Bears, MD;  Location: [facility] ENDOSCOPY;  Service: Gastroenterology;;  . ESOPHAGOGASTRODUODENOSCOPY (EGD) WITH PROPOFOL N/A 06/15/2018   Procedure: ESOPHAGOGASTRODUODENOSCOPY (EGD) WITH PROPOFOL;  Surgeon: Jerene Bears, MD;  Location: [facility];  Service: Gastroenterology;  Laterality: N/A;  . KNEE ARTHROSCOPY  10/2008   left  . LEFT HEART CATHETERIZATION WITH CORONARY ANGIOGRAM N/A 08/11/2013   Procedure: LEFT HEART CATHETERIZATION WITH CORONARY ANGIOGRAM;  Surgeon: Pixie Casino, MD;  Location: [facility] CATH LAB;  Service: Cardiovascular;  Laterality: N/A;  . UPPER GI ENDOSCOPY  10/2007   showed gastritis   Social History   Occupational History  . Occupation: disability  Tobacco Use  . Smoking status: Former Smoker    Packs/day: 0.50    Years:  20.00    Pack years: 10.00    Quit date: 03/23/2016    Years since quitting: 4.5  . Smokeless tobacco: Current User    Types: Chew  . Tobacco comment:  chews occasionally  Vaping Use  . Vaping Use: Never used  Substance and Sexual Activity  . Alcohol use: No  . Drug use: No  . Sexual activity: Not on file

## 2020-10-18 ENCOUNTER — Encounter: Payer: Self-pay | Admitting: Family

## 2020-10-18 ENCOUNTER — Ambulatory Visit (INDEPENDENT_AMBULATORY_CARE_PROVIDER_SITE_OTHER): Payer: Medicare HMO | Admitting: Family

## 2020-10-18 DIAGNOSIS — E1142 Type 2 diabetes mellitus with diabetic polyneuropathy: Secondary | ICD-10-CM

## 2020-10-18 DIAGNOSIS — L97511 Non-pressure chronic ulcer of other part of right foot limited to breakdown of skin: Secondary | ICD-10-CM

## 2020-10-19 NOTE — Progress Notes (Signed)
Office Visit Note   Patient: Grant [name]           Date of Birth: [birth year]           MRN: [id number] Visit Date: 10/18/2020              Requested by: [name], [place],  [place] [address] PCP: [name], MD  Chief Complaint  Patient presents with  . Right Foot - Wound Check      HPI: The patient is a 49 year old gentleman seen today in routine follow-up for Wagner grade 1 ulcer of the right foot he does have diabetic insensate neuropathy is concern for slow healing of his wound. He continues out of work and is using felt pressure relieving pads in his shoe wear  Continues to be very concerned for slow healing of the wound radiographs of his foot at initial visit were reassuring  Assessment & Plan: Visit Diagnoses:  1. Right foot ulcer, limited to breakdown of skin ([place])   2. Type 2 diabetes mellitus with peripheral neuropathy (HCC)     Plan: He will continue with daily dressing changes.  Continue with Prisma dressings.  Continue doughnuts for felt pressure relief in his shoe wear.  He will continue out of work  Discussed heel cord stretching  We will refer to vascular to ensure adequate blood flow to the foot.  He would like to follow-up with Dr. Sharol Given at his next appointment with [place].   Follow-Up Instructions: Return in about 20 days (around 11/07/2020).   Ortho Exam  Patient is alert, oriented, no adenopathy, well-dressed, normal affect, normal respiratory effort. On examination of the right foot there is ulceration to the plantar aspect of the fifth metatarsal head this is 11 m in diameter about 1 mm deep.  Following debridement with a 10 blade knife there is no probing.  Full granulation in the wound bed.  No drainage no surrounding erythema no odor no warmth  Does have a biphasic pulse with a Doppler  Imaging: No results found. No images are attached to the encounter.  Labs: Lab Results  Component Value Date   HGBA1C 9.4  (H) 06/09/2018   ESRSEDRATE 2 11/30/2015   REPTSTATUS 06/14/2018 FINAL 06/09/2018   REPTSTATUS 06/14/2018 FINAL 06/09/2018   CULT  06/09/2018    NO GROWTH 5 DAYS Performed at [facility], [place] [address], [place], [place] [address]    CULT  06/09/2018    NO GROWTH 5 DAYS Performed at [facility] [address] [place], [place] [address]      Lab Results  Component Value Date   ALBUMIN 4.5 08/01/2018   ALBUMIN 3.7 07/28/2018   ALBUMIN 4.2 07/27/2018    Lab Results  Component Value Date   MG 2.0 07/30/2018   MG 2.0 07/28/2018   MG 1.8 07/27/2018   No results found for: VD25OH  No results found for: PREALBUMIN CBC EXTENDED Latest Ref Rng & Units 08/01/2018 07/30/2018 07/28/2018  WBC 4.0 - 10.5 K/uL 12.6(H) 8.1 10.4  RBC 4.22 - 5.81 MIL/uL 5.23 4.44 4.66  HGB 13.0 - 17.0 g/dL 15.9 13.2 14.0  HCT 39 - 52 % 43.8 38.4(L) 40.5  PLT 150 - 400 K/uL 222 175 207  NEUTROABS 1.7 - 7.7 K/uL 10.0(H) - 8.1(H)  LYMPHSABS 0.7 - 4.0 K/uL 1.5 - 1.5     There is no height or weight on file to calculate BMI.  Orders:  Orders  Placed This Encounter  Procedures  . Ambulatory referral to Vascular Surgery   No orders of the defined types were placed in this encounter.    Procedures: No procedures performed  Clinical Data: No additional findings.  ROS:  All other systems negative, except as noted in the HPI. Review of Systems  Constitutional: Negative for chills and fever.  Cardiovascular: Negative for leg swelling.  Skin: Positive for wound. Negative for color change.    Objective: Vital Signs: There were no vitals taken for this visit.  Specialty Comments:  No specialty comments available.  PMFS History: Patient Active Problem List   Diagnosis Date Noted  . Cannabis hyperemesis syndrome concurrent with and due to cannabis abuse ([place]) 08/01/2018  . Gastroesophageal reflux disease 07/17/2018  . Long QT interval 07/16/2018  . Early satiety   . Esophagitis,  [place] grade D   . Hypercalcemia 06/09/2018  . SIRS (systemic inflammatory response syndrome) ([place]) 06/09/2018  . Hematemesis 06/09/2018  . Type 2 diabetes mellitus with peripheral neuropathy ([place]) 06/09/2018  . Acidosis 09/17/2016  . Dehydration 09/17/2016  . Abdominal pain 09/17/2016  . DKA (diabetic ketoacidoses) 09/17/2016  . Nausea and vomiting 09/17/2016  . IDDM (insulin dependent diabetes mellitus)   . Gastroparesis   . Diabetic peripheral neuropathy ([place]) 11/30/2015  . Chronic low back pain 11/30/2015  . Coronary artery spasm ([place]) 08/17/2013  . Hyperlipidemia 08/17/2013  . Peripheral neuropathy 08/05/2013  . Chest pain 08/05/2013  . Fatigue 08/05/2013  . Tachycardia 08/05/2013  . DOE (dyspnea on exertion) 08/05/2013  . Diabetes mellitus, insulin dependent (IDDM), uncontrolled 02/09/2008  . Anxiety state 02/09/2008  . Essential hypertension 02/09/2008  . ALCOHOL ABUSE, HX OF 02/09/2008  . LIVER FUNCTION TESTS, ABNORMAL, HX OF 02/09/2008  . NEOPLASM, BENIGN, ESOPHAGUS 09/16/2007  . Gastritis 09/16/2007   Past Medical History:  Diagnosis Date  . Chronic low back pain 11/30/2015  . Diabetic peripheral neuropathy ([place]) 11/30/2015  . Hypertension   . Neuropathy   . Type 2 diabetes mellitus (HCC)     Family History  Problem Relation Age of Onset  . Diabetes Mother   . Hypertension Mother   . Hypertension Father   . Hyperlipidemia Father   . Alcohol abuse Brother   . Brain cancer Unknown        grandparent  . Stomach cancer Unknown        grandparent  . Stomach cancer Paternal Grandfather   . Colon cancer Neg Hx   . Esophageal cancer Neg Hx   . Rectal cancer Neg Hx     Past Surgical History:  Procedure Laterality Date  . back lipoma resection  03/2006  . BIOPSY  06/15/2018   Procedure: BIOPSY;  Surgeon: Jerene Bears, MD;  Location: [facility] ENDOSCOPY;  Service: Gastroenterology;;  . ESOPHAGOGASTRODUODENOSCOPY (EGD) WITH PROPOFOL N/A 06/15/2018   Procedure:  ESOPHAGOGASTRODUODENOSCOPY (EGD) WITH PROPOFOL;  Surgeon: Jerene Bears, MD;  Location: [facility];  Service: Gastroenterology;  Laterality: N/A;  . KNEE ARTHROSCOPY  10/2008   left  . LEFT HEART CATHETERIZATION WITH CORONARY ANGIOGRAM N/A 08/11/2013   Procedure: LEFT HEART CATHETERIZATION WITH CORONARY ANGIOGRAM;  Surgeon: Pixie Casino, MD;  Location: [facility] CATH LAB;  Service: Cardiovascular;  Laterality: N/A;  . UPPER GI ENDOSCOPY  10/2007   showed gastritis   Social History   Occupational History  . Occupation: disability  Tobacco Use  . Smoking status: Former Smoker    Packs/day: 0.50    Years: 20.00  Pack years: 10.00    Quit date: 03/23/2016    Years since quitting: 4.5  . Smokeless tobacco: Current User    Types: Chew  . Tobacco comment:  chews occasionally  Vaping Use  . Vaping Use: Never used  Substance and Sexual Activity  . Alcohol use: No  . Drug use: No  . Sexual activity: Not on file

## 2020-10-27 DIAGNOSIS — E1140 Type 2 diabetes mellitus with diabetic neuropathy, unspecified: Secondary | ICD-10-CM | POA: Diagnosis not present

## 2020-10-27 DIAGNOSIS — Z794 Long term (current) use of insulin: Secondary | ICD-10-CM | POA: Diagnosis not present

## 2020-10-27 DIAGNOSIS — E11621 Type 2 diabetes mellitus with foot ulcer: Secondary | ICD-10-CM | POA: Diagnosis not present

## 2020-10-27 DIAGNOSIS — I10 Essential (primary) hypertension: Secondary | ICD-10-CM | POA: Diagnosis not present

## 2020-10-28 ENCOUNTER — Other Ambulatory Visit (INDEPENDENT_AMBULATORY_CARE_PROVIDER_SITE_OTHER): Payer: Self-pay | Admitting: Vascular Surgery

## 2020-10-28 DIAGNOSIS — L97511 Non-pressure chronic ulcer of other part of right foot limited to breakdown of skin: Secondary | ICD-10-CM

## 2020-10-31 DIAGNOSIS — E113213 Type 2 diabetes mellitus with mild nonproliferative diabetic retinopathy with macular edema, bilateral: Secondary | ICD-10-CM | POA: Diagnosis not present

## 2020-10-31 DIAGNOSIS — H43823 Vitreomacular adhesion, bilateral: Secondary | ICD-10-CM | POA: Diagnosis not present

## 2020-10-31 DIAGNOSIS — H2513 Age-related nuclear cataract, bilateral: Secondary | ICD-10-CM | POA: Diagnosis not present

## 2020-11-01 ENCOUNTER — Ambulatory Visit (INDEPENDENT_AMBULATORY_CARE_PROVIDER_SITE_OTHER): Payer: Medicare HMO | Admitting: Nurse Practitioner

## 2020-11-01 ENCOUNTER — Other Ambulatory Visit: Payer: Self-pay

## 2020-11-01 ENCOUNTER — Encounter (INDEPENDENT_AMBULATORY_CARE_PROVIDER_SITE_OTHER): Payer: Self-pay | Admitting: Nurse Practitioner

## 2020-11-01 ENCOUNTER — Ambulatory Visit (INDEPENDENT_AMBULATORY_CARE_PROVIDER_SITE_OTHER): Payer: Medicare HMO

## 2020-11-01 VITALS — BP 116/62 | HR 70 | Resp 16 | Ht 71.5 in | Wt 246.0 lb

## 2020-11-01 DIAGNOSIS — L97511 Non-pressure chronic ulcer of other part of right foot limited to breakdown of skin: Secondary | ICD-10-CM

## 2020-11-01 DIAGNOSIS — E7849 Other hyperlipidemia: Secondary | ICD-10-CM | POA: Diagnosis not present

## 2020-11-01 DIAGNOSIS — F17200 Nicotine dependence, unspecified, uncomplicated: Secondary | ICD-10-CM

## 2020-11-01 DIAGNOSIS — E1142 Type 2 diabetes mellitus with diabetic polyneuropathy: Secondary | ICD-10-CM

## 2020-11-02 ENCOUNTER — Telehealth (INDEPENDENT_AMBULATORY_CARE_PROVIDER_SITE_OTHER): Payer: Self-pay

## 2020-11-02 NOTE — Telephone Encounter (Signed)
Spoke with the patient and he is scheduled with Dr. Lucky Cowboy for a RLE angio on 11/10/20 with a 8:45 am arrival time to the [facility]. Covid testing on 11/08/20 between 8-1 pm at there [facility]. Pre-procedure instructions were discussed and will be mailed.

## 2020-11-06 ENCOUNTER — Encounter (INDEPENDENT_AMBULATORY_CARE_PROVIDER_SITE_OTHER): Payer: Self-pay | Admitting: Nurse Practitioner

## 2020-11-06 NOTE — Progress Notes (Signed)
Subjective:    Patient ID: Grant [name], male    DOB: 1971/11/20, 49 y.o.   MRN: [id number] Chief Complaint  Patient presents with  . New Patient (Initial Visit)    ref Coralyn Pear ulcer abi    The patient is seen for evaluation of painful lower extremities and diminished pulses associated with ulceration of the foot.  The patient notes the ulcer has been present for multiple weeks and has not been improving.   No specific history of trauma noted by the patient.  The patient denies fever or chills.  the patient does have diabetes which has been difficult to control.   The patient denies rest pain or dangling of an extremity off the side of the bed during the night for relief. No prior interventions or surgeries.  No history of back problems or DJD of the lumbar sacral spine.   The patient denies amaurosis fugax or recent TIA symptoms. There are no recent neurological changes noted. The patient denies history of DVT, PE or superficial thrombophlebitis. The patient denies recent episodes of angina or shortness of breath.   Today noninvasive studies reveal an ABI 1.0 in the right lower extremity and an ABI of 1.29 in the left.  The right lower extremity shows monophasic waveforms in the anterior tibial artery with biphasic at the posterior tibial artery.  The patient has strong biphasic waveforms in the left lower extremity.  The patient has good toe waveforms in the left lower extremity with slightly dampened in the right.   Review of Systems  Skin: Positive for wound.       Objective:   Physical Exam Vitals reviewed.  HENT:     Head: Normocephalic.  Cardiovascular:     Rate and Rhythm: Normal rate.     Pulses: Decreased pulses.  Pulmonary:     Effort: Pulmonary effort is normal.  Skin:    General: Skin is warm and dry.  Neurological:     Mental Status: He is alert and oriented to person, place, and time.  Psychiatric:        Mood and Affect: Mood normal.        Behavior:  Behavior normal.        Thought Content: Thought content normal.        Judgment: Judgment normal.     BP 116/62 (BP Location: Left Arm)   Pulse 70   Resp 16   Ht 5' 11.5" (1.816 m)   Wt 246 lb (111.6 kg)   BMI 33.83 kg/m   Past Medical History:  Diagnosis Date  . Chronic low back pain 11/30/2015  . Diabetic peripheral neuropathy ([place]) 11/30/2015  . Hypertension   . Neuropathy   . Type 2 diabetes mellitus ([place])     Social History   Socioeconomic History  . Marital status: Married    Spouse name: Not on file  . Number of children: 2  . Years of education: college  . Highest education level: Not on file  Occupational History  . Occupation: disability  Tobacco Use  . Smoking status: Former Smoker    Packs/day: 0.50    Years: 20.00    Pack years: 10.00    Quit date: 03/23/2016    Years since quitting: 4.6  . Smokeless tobacco: Current User    Types: Chew  . Tobacco comment:  chews occasionally  Vaping Use  . Vaping Use: Never used  Substance and Sexual Activity  . Alcohol use: No  . Drug  use: No  . Sexual activity: Not on file  Other Topics Concern  . Not on file  Social History Narrative   Patient drinks 1 cup of caffeine daily.   Patient is right handed.    Social Determinants of Health   Financial Resource Strain: Not on file  Food Insecurity: Not on file  Transportation Needs: Not on file  Physical Activity: Not on file  Stress: Not on file  Social Connections: Not on file  Intimate Partner Violence: Not on file    Past Surgical History:  Procedure Laterality Date  . back lipoma resection  03/2006  . BIOPSY  06/15/2018   Procedure: BIOPSY;  Surgeon: Jerene Bears, MD;  Location: [facility] ENDOSCOPY;  Service: Gastroenterology;;  . ESOPHAGOGASTRODUODENOSCOPY (EGD) WITH PROPOFOL N/A 06/15/2018   Procedure: ESOPHAGOGASTRODUODENOSCOPY (EGD) WITH PROPOFOL;  Surgeon: Jerene Bears, MD;  Location: [place] [facility];  Service: Gastroenterology;  Laterality: N/A;  . KNEE  ARTHROSCOPY  10/2008   left  . LEFT HEART CATHETERIZATION WITH CORONARY ANGIOGRAM N/A 08/11/2013   Procedure: LEFT HEART CATHETERIZATION WITH CORONARY ANGIOGRAM;  Surgeon: Pixie Casino, MD;  Location: [facility] CATH LAB;  Service: Cardiovascular;  Laterality: N/A;  . UPPER GI ENDOSCOPY  10/2007   showed gastritis    Family History  Problem Relation Age of Onset  . Diabetes Mother   . Hypertension Mother   . Hypertension Father   . Hyperlipidemia Father   . Alcohol abuse Brother   . Brain cancer Other        grandparent  . Stomach cancer Other        grandparent  . Stomach cancer Paternal Grandfather   . Colon cancer Neg Hx   . Esophageal cancer Neg Hx   . Rectal cancer Neg Hx     Allergies  Allergen Reactions  . Codeine Nausea And Vomiting  . Tylox [Oxycodone-Acetaminophen] Nausea And Vomiting    CBC Latest Ref Rng & Units 08/01/2018 07/30/2018 07/28/2018  WBC 4.0 - 10.5 K/uL 12.6(H) 8.1 10.4  Hemoglobin 13.0 - 17.0 g/dL 15.9 13.2 14.0  Hematocrit 39.0 - 52.0 % 43.8 38.4(L) 40.5  Platelets 150 - 400 K/uL 222 175 207      CMP     Component Value Date/Time   NA 139 08/01/2018 0749   NA 138 09/02/2013 1024   K 3.9 08/01/2018 0749   K 4.5 09/02/2013 1024   CL 100 08/01/2018 0749   CO2 22 08/01/2018 0749   CO2 26 09/02/2013 1024   GLUCOSE 174 (H) 08/01/2018 0749   GLUCOSE 261 (H) 09/02/2013 1024   BUN 6 08/01/2018 0749   BUN 9.1 09/02/2013 1024   CREATININE 0.65 08/01/2018 0749   CREATININE 0.8 09/02/2013 1024   CALCIUM 9.6 08/01/2018 0749   CALCIUM 9.9 09/02/2013 1024   PROT 7.4 08/01/2018 0749   PROT 7.2 11/30/2015 1010   PROT 7.4 09/02/2013 1024   ALBUMIN 4.5 08/01/2018 0749   ALBUMIN 4.2 09/02/2013 1024   AST 34 08/01/2018 0749   AST 12 09/02/2013 1024   ALT 22 08/01/2018 0749   ALT 17 09/02/2013 1024   ALKPHOS 69 08/01/2018 0749   ALKPHOS 67 09/02/2013 1024   BILITOT 1.5 (H) 08/01/2018 0749   BILITOT 0.90 09/02/2013 1024   GFRNONAA >60 08/01/2018 0749    GFRAA >60 08/01/2018 0749     VAS [place] ABI WITH/WO TBI  Result Date: 11/04/2020 LOWER EXTREMITY DOPPLER STUDY Indications: Ulceration.  Performing Technologist: Charlane Ferretti RT (R)(VS)  Examination Guidelines:  A complete evaluation includes at minimum, Doppler waveform signals and systolic blood pressure reading at the level of bilateral brachial, anterior tibial, and posterior tibial arteries, when vessel segments are accessible. Bilateral testing is considered an integral part of a complete examination. Photoelectric Plethysmograph (PPG) waveforms and toe systolic pressure readings are included as required and additional duplex testing as needed. Limited examinations for reoccurring indications may be performed as noted.  ABI Findings: +---------+------------------+-----+----------+--------+ Right    Rt Pressure (mmHg)IndexWaveform  Comment  +---------+------------------+-----+----------+--------+ Brachial 81                                        +---------+------------------+-----+----------+--------+ ATA      102               0.86 monophasic         +---------+------------------+-----+----------+--------+ PTA      118               1.00 biphasic           +---------+------------------+-----+----------+--------+ Great Toe113               0.96 Dampened           +---------+------------------+-----+----------+--------+ +---------+------------------+-----+--------+-------+ Left     Lt Pressure (mmHg)IndexWaveformComment +---------+------------------+-----+--------+-------+ Brachial 118                                    +---------+------------------+-----+--------+-------+ ATA      134               1.14 biphasic        +---------+------------------+-----+--------+-------+ PTA      152               1.29 biphasic        +---------+------------------+-----+--------+-------+ Great Toe130               1.10 Normal           +---------+------------------+-----+--------+-------+ Summary: Right: Resting right ankle-brachial index is within normal range. No evidence of significant right lower extremity arterial disease. The right toe-brachial index is normal. Although ankle brachial indices are within normal limits (0.95-1.29), arterial Doppler waveforms at the ankle suggest some component of arterial occlusive disease. Right vertebral artery demonstraes antegrade flow. Left: Resting left ankle-brachial index is within normal range. No evidence of significant left lower extremity arterial disease. The left toe-brachial index is normal. *See table(s) above for measurements and observations.  Electronically signed by Leotis Pain MD on 11/04/2020 at 10:58:32 AM.   Final        Assessment & Plan:   1. Ulcer of right foot, limited to breakdown of skin ([place])  Recommend:  The patient has evidence of atherosclerotic changes of the right lower extremity associated with ulceration and tissue loss of the foot.  This represents a limb threatening ischemia and places the patient at the risk for limb loss.  Patient should undergo angiography of the lower extremities with the hope for intervention for limb salvage.  The risks and benefits as well as the alternative therapies was discussed in detail with the patient.  All questions were answered.  Patient agrees to proceed with angiography.  The patient will follow up with me in the office after the procedure.    2. Other hyperlipidemia Continue statin as ordered and reviewed, no changes  at this time   3. Type 2 diabetes mellitus with peripheral neuropathy (HCC) Continue hypoglycemic medications as already ordered, these medications have been reviewed and there are no changes at this time.  Hgb A1C to be monitored as already arranged by primary service   4. Tobacco use disorder Smoking cessation was discussed, 3-10 minutes spent on this topic specifically    Current Outpatient  Medications on File Prior to Visit  Medication Sig Dispense Refill  . acetaminophen (TYLENOL) 500 MG tablet Take 1,000 mg by mouth every 8 (eight) hours as needed for mild pain or headache.    Marland Kitchen amLODipine (NORVASC) 10 MG tablet Take 1 tablet (10 mg total) by mouth daily. 30 tablet 0  . hydrOXYzine (ATARAX/VISTARIL) 25 MG tablet Take 25 mg by mouth at bedtime as needed for anxiety (sleep).   12  . ibuprofen (ADVIL,MOTRIN) 200 MG tablet Take 200 mg by mouth every 6 (six) hours as needed.    . insulin NPH Human (HUMULIN N,NOVOLIN N) 100 UNIT/ML injection Inject 0.4-0.45 mLs (40-45 Units total) into the skin See admin instructions. Use 40 units every morning then use 45 units every evening 10 mL 0  . insulin regular (NOVOLIN R,HUMULIN R) 100 units/mL injection Inject 4-8 Units into the skin 3 (three) times daily before meals. Sliding scale    . metoCLOPramide (REGLAN) 10 MG tablet Take 1 tablet (10 mg total) by mouth 4 (four) times daily -  before meals and at bedtime. For 2 weeks 56 tablet 0  . metoCLOPramide (REGLAN) 10 MG tablet Take 1 tablet (10 mg total) by mouth 4 (four) times daily -  before meals and at bedtime. 60 tablet 2  . metoprolol succinate (TOPROL-XL) 25 MG 24 hr tablet Take 25 mg by mouth daily.  12  . pantoprazole (PROTONIX) 40 MG tablet Take 1 tablet (40 mg total) by mouth 2 (two) times daily before a meal. 60 tablet 11  . PARoxetine (PAXIL) 10 MG tablet Take 10 mg by mouth 2 (two) times daily.  6  . prochlorperazine (COMPAZINE) 25 MG suppository Place 1 suppository (25 mg total) rectally every 8 (eight) hours as needed for nausea or vomiting. 6 suppository 0  . scopolamine (TRANSDERM-SCOP) 1 MG/3DAYS Place 1 patch (1.5 mg total) onto the skin every 3 (three) days. 10 patch 0  . sucralfate (CARAFATE) 1 GM/10ML suspension Take 10 mLs (1 g total) by mouth 4 (four) times daily -  with meals and at bedtime. 420 mL 0   No current facility-administered medications on file prior to visit.     There are no Patient Instructions on file for this visit. No follow-ups on file.   Kris Hartmann, NP

## 2020-11-06 NOTE — H&P (View-Only) (Signed)
Subjective:    Patient ID: Grant [name], male    DOB: 02/06/1971, 49 y.o.   MRN: [id number] Chief Complaint  Patient presents with  . New Patient (Initial Visit)    ref Coralyn Pear ulcer abi    The patient is seen for evaluation of painful lower extremities and diminished pulses associated with ulceration of the foot.  The patient notes the ulcer has been present for multiple weeks and has not been improving.   No specific history of trauma noted by the patient.  The patient denies fever or chills.  the patient does have diabetes which has been difficult to control.   The patient denies rest pain or dangling of an extremity off the side of the bed during the night for relief. No prior interventions or surgeries.  No history of back problems or DJD of the lumbar sacral spine.   The patient denies amaurosis fugax or recent TIA symptoms. There are no recent neurological changes noted. The patient denies history of DVT, PE or superficial thrombophlebitis. The patient denies recent episodes of angina or shortness of breath.   Today noninvasive studies reveal an ABI 1.0 in the right lower extremity and an ABI of 1.29 in the left.  The right lower extremity shows monophasic waveforms in the anterior tibial artery with biphasic at the posterior tibial artery.  The patient has strong biphasic waveforms in the left lower extremity.  The patient has good toe waveforms in the left lower extremity with slightly dampened in the right.   Review of Systems  Skin: Positive for wound.       Objective:   Physical Exam Vitals reviewed.  HENT:     Head: Normocephalic.  Cardiovascular:     Rate and Rhythm: Normal rate.     Pulses: Decreased pulses.  Pulmonary:     Effort: Pulmonary effort is normal.  Skin:    General: Skin is warm and dry.  Neurological:     Mental Status: He is alert and oriented to person, place, and time.  Psychiatric:        Mood and Affect: Mood normal.        Behavior:  Behavior normal.        Thought Content: Thought content normal.        Judgment: Judgment normal.     BP 116/62 (BP Location: Left Arm)   Pulse 70   Resp 16   Ht 5' 11.5" (1.816 m)   Wt 246 lb (111.6 kg)   BMI 33.83 kg/m   Past Medical History:  Diagnosis Date  . Chronic low back pain 11/30/2015  . Diabetic peripheral neuropathy ([place]) 11/30/2015  . Hypertension   . Neuropathy   . Type 2 diabetes mellitus ([place])     Social History   Socioeconomic History  . Marital status: Married    Spouse name: Not on file  . Number of children: 2  . Years of education: college  . Highest education level: Not on file  Occupational History  . Occupation: disability  Tobacco Use  . Smoking status: Former Smoker    Packs/day: 0.50    Years: 20.00    Pack years: 10.00    Quit date: 03/23/2016    Years since quitting: 4.6  . Smokeless tobacco: Current User    Types: Chew  . Tobacco comment:  chews occasionally  Vaping Use  . Vaping Use: Never used  Substance and Sexual Activity  . Alcohol use: No  . Drug  use: No  . Sexual activity: Not on file  Other Topics Concern  . Not on file  Social History Narrative   Patient drinks 1 cup of caffeine daily.   Patient is right handed.    Social Determinants of Health   Financial Resource Strain: Not on file  Food Insecurity: Not on file  Transportation Needs: Not on file  Physical Activity: Not on file  Stress: Not on file  Social Connections: Not on file  Intimate Partner Violence: Not on file    Past Surgical History:  Procedure Laterality Date  . back lipoma resection  03/2006  . BIOPSY  06/15/2018   Procedure: BIOPSY;  Surgeon: Jerene Bears, MD;  Location: [facility] ENDOSCOPY;  Service: Gastroenterology;;  . ESOPHAGOGASTRODUODENOSCOPY (EGD) WITH PROPOFOL N/A 06/15/2018   Procedure: ESOPHAGOGASTRODUODENOSCOPY (EGD) WITH PROPOFOL;  Surgeon: Jerene Bears, MD;  Location: [place];  Service: Gastroenterology;  Laterality: N/A;  . KNEE  ARTHROSCOPY  10/2008   left  . LEFT HEART CATHETERIZATION WITH CORONARY ANGIOGRAM N/A 08/11/2013   Procedure: LEFT HEART CATHETERIZATION WITH CORONARY ANGIOGRAM;  Surgeon: Pixie Casino, MD;  Location: [facility] CATH LAB;  Service: Cardiovascular;  Laterality: N/A;  . UPPER GI ENDOSCOPY  10/2007   showed gastritis    Family History  Problem Relation Age of Onset  . Diabetes Mother   . Hypertension Mother   . Hypertension Father   . Hyperlipidemia Father   . Alcohol abuse Brother   . Brain cancer Other        grandparent  . Stomach cancer Other        grandparent  . Stomach cancer Paternal Grandfather   . Colon cancer Neg Hx   . Esophageal cancer Neg Hx   . Rectal cancer Neg Hx     Allergies  Allergen Reactions  . Codeine Nausea And Vomiting  . Tylox [Oxycodone-Acetaminophen] Nausea And Vomiting    CBC Latest Ref Rng & Units 08/01/2018 07/30/2018 07/28/2018  WBC 4.0 - 10.5 K/uL 12.6(H) 8.1 10.4  Hemoglobin 13.0 - 17.0 g/dL 15.9 13.2 14.0  Hematocrit 39.0 - 52.0 % 43.8 38.4(L) 40.5  Platelets 150 - 400 K/uL 222 175 207      CMP     Component Value Date/Time   NA 139 08/01/2018 0749   NA 138 09/02/2013 1024   K 3.9 08/01/2018 0749   K 4.5 09/02/2013 1024   CL 100 08/01/2018 0749   CO2 22 08/01/2018 0749   CO2 26 09/02/2013 1024   GLUCOSE 174 (H) 08/01/2018 0749   GLUCOSE 261 (H) 09/02/2013 1024   BUN 6 08/01/2018 0749   BUN 9.1 09/02/2013 1024   CREATININE 0.65 08/01/2018 0749   CREATININE 0.8 09/02/2013 1024   CALCIUM 9.6 08/01/2018 0749   CALCIUM 9.9 09/02/2013 1024   PROT 7.4 08/01/2018 0749   PROT 7.2 11/30/2015 1010   PROT 7.4 09/02/2013 1024   ALBUMIN 4.5 08/01/2018 0749   ALBUMIN 4.2 09/02/2013 1024   AST 34 08/01/2018 0749   AST 12 09/02/2013 1024   ALT 22 08/01/2018 0749   ALT 17 09/02/2013 1024   ALKPHOS 69 08/01/2018 0749   ALKPHOS 67 09/02/2013 1024   BILITOT 1.5 (H) 08/01/2018 0749   BILITOT 0.90 09/02/2013 1024   GFRNONAA >60 08/01/2018 0749    GFRAA >60 08/01/2018 0749     VAS [place] ABI WITH/WO TBI  Result Date: 11/04/2020 LOWER EXTREMITY DOPPLER STUDY Indications: Ulceration.  Performing Technologist: Charlane Ferretti RT (R)(VS)  Examination Guidelines:  A complete evaluation includes at minimum, Doppler waveform signals and systolic blood pressure reading at the level of bilateral brachial, anterior tibial, and posterior tibial arteries, when vessel segments are accessible. Bilateral testing is considered an integral part of a complete examination. Photoelectric Plethysmograph (PPG) waveforms and toe systolic pressure readings are included as required and additional duplex testing as needed. Limited examinations for reoccurring indications may be performed as noted.  ABI Findings: +---------+------------------+-----+----------+--------+ Right    Rt Pressure (mmHg)IndexWaveform  Comment  +---------+------------------+-----+----------+--------+ Brachial 81                                        +---------+------------------+-----+----------+--------+ ATA      102               0.86 monophasic         +---------+------------------+-----+----------+--------+ PTA      118               1.00 biphasic           +---------+------------------+-----+----------+--------+ Great Toe113               0.96 Dampened           +---------+------------------+-----+----------+--------+ +---------+------------------+-----+--------+-------+ Left     Lt Pressure (mmHg)IndexWaveformComment +---------+------------------+-----+--------+-------+ Brachial 118                                    +---------+------------------+-----+--------+-------+ ATA      134               1.14 biphasic        +---------+------------------+-----+--------+-------+ PTA      152               1.29 biphasic        +---------+------------------+-----+--------+-------+ Great Toe130               1.10 Normal           +---------+------------------+-----+--------+-------+ Summary: Right: Resting right ankle-brachial index is within normal range. No evidence of significant right lower extremity arterial disease. The right toe-brachial index is normal. Although ankle brachial indices are within normal limits (0.95-1.29), arterial Doppler waveforms at the ankle suggest some component of arterial occlusive disease. Right vertebral artery demonstraes antegrade flow. Left: Resting left ankle-brachial index is within normal range. No evidence of significant left lower extremity arterial disease. The left toe-brachial index is normal. *See table(s) above for measurements and observations.  Electronically signed by Leotis Pain MD on 11/04/2020 at 10:58:32 AM.   Final        Assessment & Plan:   1. Ulcer of right foot, limited to breakdown of skin ([place])  Recommend:  The patient has evidence of atherosclerotic changes of the right lower extremity associated with ulceration and tissue loss of the foot.  This represents a limb threatening ischemia and places the patient at the risk for limb loss.  Patient should undergo angiography of the lower extremities with the hope for intervention for limb salvage.  The risks and benefits as well as the alternative therapies was discussed in detail with the patient.  All questions were answered.  Patient agrees to proceed with angiography.  The patient will follow up with me in the office after the procedure.    2. Other hyperlipidemia Continue statin as ordered and reviewed, no changes  at this time   3. Type 2 diabetes mellitus with peripheral neuropathy (HCC) Continue hypoglycemic medications as already ordered, these medications have been reviewed and there are no changes at this time.  Hgb A1C to be monitored as already arranged by primary service   4. Tobacco use disorder Smoking cessation was discussed, 3-10 minutes spent on this topic specifically    Current Outpatient  Medications on File Prior to Visit  Medication Sig Dispense Refill  . acetaminophen (TYLENOL) 500 MG tablet Take 1,000 mg by mouth every 8 (eight) hours as needed for mild pain or headache.    Marland Kitchen amLODipine (NORVASC) 10 MG tablet Take 1 tablet (10 mg total) by mouth daily. 30 tablet 0  . hydrOXYzine (ATARAX/VISTARIL) 25 MG tablet Take 25 mg by mouth at bedtime as needed for anxiety (sleep).   12  . ibuprofen (ADVIL,MOTRIN) 200 MG tablet Take 200 mg by mouth every 6 (six) hours as needed.    . insulin NPH Human (HUMULIN N,NOVOLIN N) 100 UNIT/ML injection Inject 0.4-0.45 mLs (40-45 Units total) into the skin See admin instructions. Use 40 units every morning then use 45 units every evening 10 mL 0  . insulin regular (NOVOLIN R,HUMULIN R) 100 units/mL injection Inject 4-8 Units into the skin 3 (three) times daily before meals. Sliding scale    . metoCLOPramide (REGLAN) 10 MG tablet Take 1 tablet (10 mg total) by mouth 4 (four) times daily -  before meals and at bedtime. For 2 weeks 56 tablet 0  . metoCLOPramide (REGLAN) 10 MG tablet Take 1 tablet (10 mg total) by mouth 4 (four) times daily -  before meals and at bedtime. 60 tablet 2  . metoprolol succinate (TOPROL-XL) 25 MG 24 hr tablet Take 25 mg by mouth daily.  12  . pantoprazole (PROTONIX) 40 MG tablet Take 1 tablet (40 mg total) by mouth 2 (two) times daily before a meal. 60 tablet 11  . PARoxetine (PAXIL) 10 MG tablet Take 10 mg by mouth 2 (two) times daily.  6  . prochlorperazine (COMPAZINE) 25 MG suppository Place 1 suppository (25 mg total) rectally every 8 (eight) hours as needed for nausea or vomiting. 6 suppository 0  . scopolamine (TRANSDERM-SCOP) 1 MG/3DAYS Place 1 patch (1.5 mg total) onto the skin every 3 (three) days. 10 patch 0  . sucralfate (CARAFATE) 1 GM/10ML suspension Take 10 mLs (1 g total) by mouth 4 (four) times daily -  with meals and at bedtime. 420 mL 0   No current facility-administered medications on file prior to visit.     There are no Patient Instructions on file for this visit. No follow-ups on file.   Kris Hartmann, NP

## 2020-11-07 ENCOUNTER — Ambulatory Visit: Payer: Medicare HMO | Admitting: Orthopedic Surgery

## 2020-11-07 ENCOUNTER — Encounter: Payer: Self-pay | Admitting: Orthopedic Surgery

## 2020-11-07 VITALS — Ht 71.0 in | Wt 246.0 lb

## 2020-11-07 DIAGNOSIS — E1142 Type 2 diabetes mellitus with diabetic polyneuropathy: Secondary | ICD-10-CM

## 2020-11-07 DIAGNOSIS — L97511 Non-pressure chronic ulcer of other part of right foot limited to breakdown of skin: Secondary | ICD-10-CM

## 2020-11-08 ENCOUNTER — Other Ambulatory Visit
Admission: RE | Admit: 2020-11-08 | Discharge: 2020-11-08 | Disposition: A | Discharge status: Home / Self Care | Payer: Medicare HMO | Source: Ambulatory Visit | Attending: Vascular Surgery | Admitting: Vascular Surgery

## 2020-11-08 ENCOUNTER — Other Ambulatory Visit: Payer: Self-pay

## 2020-11-08 DIAGNOSIS — Z20822 Contact with and (suspected) exposure to covid-19: Secondary | ICD-10-CM | POA: Insufficient documentation

## 2020-11-08 DIAGNOSIS — Z01812 Encounter for preprocedural laboratory examination: Secondary | ICD-10-CM | POA: Insufficient documentation

## 2020-11-08 LAB — SARS CORONAVIRUS 2 (TAT 6-24 HRS): SARS Coronavirus 2: NEGATIVE

## 2020-11-10 ENCOUNTER — Other Ambulatory Visit: Payer: Self-pay

## 2020-11-10 ENCOUNTER — Ambulatory Visit
Admission: RE | Admit: 2020-11-10 | Discharge: 2020-11-10 | Disposition: A | Discharge status: Home / Self Care | Payer: Medicare HMO | Attending: Vascular Surgery | Admitting: Vascular Surgery

## 2020-11-10 ENCOUNTER — Encounter
Admission: RE | Disposition: A | Discharge status: Home / Self Care | Payer: Self-pay | Source: Home / Self Care | Attending: Vascular Surgery

## 2020-11-10 ENCOUNTER — Other Ambulatory Visit (INDEPENDENT_AMBULATORY_CARE_PROVIDER_SITE_OTHER): Payer: Self-pay | Admitting: Nurse Practitioner

## 2020-11-10 ENCOUNTER — Encounter: Payer: Self-pay | Admitting: Vascular Surgery

## 2020-11-10 DIAGNOSIS — E11621 Type 2 diabetes mellitus with foot ulcer: Secondary | ICD-10-CM | POA: Insufficient documentation

## 2020-11-10 DIAGNOSIS — E1151 Type 2 diabetes mellitus with diabetic peripheral angiopathy without gangrene: Secondary | ICD-10-CM | POA: Diagnosis not present

## 2020-11-10 DIAGNOSIS — L97909 Non-pressure chronic ulcer of unspecified part of unspecified lower leg with unspecified severity: Secondary | ICD-10-CM

## 2020-11-10 DIAGNOSIS — F17290 Nicotine dependence, other tobacco product, uncomplicated: Secondary | ICD-10-CM | POA: Insufficient documentation

## 2020-11-10 DIAGNOSIS — L97511 Non-pressure chronic ulcer of other part of right foot limited to breakdown of skin: Secondary | ICD-10-CM | POA: Diagnosis not present

## 2020-11-10 DIAGNOSIS — Z79899 Other long term (current) drug therapy: Secondary | ICD-10-CM | POA: Insufficient documentation

## 2020-11-10 DIAGNOSIS — I70235 Atherosclerosis of native arteries of right leg with ulceration of other part of foot: Secondary | ICD-10-CM | POA: Insufficient documentation

## 2020-11-10 DIAGNOSIS — Z794 Long term (current) use of insulin: Secondary | ICD-10-CM | POA: Insufficient documentation

## 2020-11-10 DIAGNOSIS — E7849 Other hyperlipidemia: Secondary | ICD-10-CM | POA: Diagnosis not present

## 2020-11-10 DIAGNOSIS — E1140 Type 2 diabetes mellitus with diabetic neuropathy, unspecified: Secondary | ICD-10-CM | POA: Diagnosis not present

## 2020-11-10 DIAGNOSIS — I70239 Atherosclerosis of native arteries of right leg with ulceration of unspecified site: Secondary | ICD-10-CM | POA: Diagnosis not present

## 2020-11-10 DIAGNOSIS — I70299 Other atherosclerosis of native arteries of extremities, unspecified extremity: Secondary | ICD-10-CM

## 2020-11-10 HISTORY — PX: LOWER EXTREMITY ANGIOGRAPHY: CATH118251

## 2020-11-10 LAB — BUN: BUN: 15 mg/dL (ref 6–20)

## 2020-11-10 LAB — GLUCOSE, CAPILLARY
Glucose-Capillary: 170 mg/dL — ABNORMAL HIGH (ref 70–99)
Glucose-Capillary: 131 mg/dL — ABNORMAL HIGH (ref 70–99)

## 2020-11-10 LAB — CREATININE, SERUM
Creatinine, Ser: 0.74 mg/dL (ref 0.61–1.24)
GFR, Estimated: >60 mL/min (ref >60)

## 2020-11-10 SURGERY — LOWER EXTREMITY ANGIOGRAPHY
Anesthesia: Moderate Sedation | Site: Leg Lower | Laterality: Right

## 2020-11-10 MED ORDER — IODIXANOL 320 MG/ML IV SOLN
INTRAVENOUS | Status: DC | PRN
  Administered 2020-11-10: 11:00:00 55 mL

## 2020-11-10 MED ORDER — SODIUM CHLORIDE 0.9% FLUSH: 3.0000 mL | Freq: Two times a day (BID) | INTRAVENOUS | Status: DC

## 2020-11-10 MED ORDER — ACETAMINOPHEN 325 MG PO TABS: 650.0000 mg | ORAL_TABLET | ORAL | Status: DC | PRN

## 2020-11-10 MED ORDER — DIPHENHYDRAMINE HCL 50 MG/ML IJ SOLN
INTRAMUSCULAR | Status: AC
  Filled 2020-11-10: qty 1

## 2020-11-10 MED ORDER — METHYLPREDNISOLONE SODIUM SUCC 125 MG IJ SOLR: 125.0000 mg | Freq: Once | INTRAMUSCULAR | Status: DC | PRN

## 2020-11-10 MED ORDER — CEFAZOLIN SODIUM-DEXTROSE 2-4 GM/100ML-% IV SOLN: 2.0000 g | Freq: Once | INTRAVENOUS | Status: AC

## 2020-11-10 MED ORDER — ATORVASTATIN CALCIUM 10 MG PO TABS: 10.0000 mg | ORAL_TABLET | Freq: Every day | ORAL | 11 refills | Status: DC

## 2020-11-10 MED ORDER — LABETALOL HCL 5 MG/ML IV SOLN: 10.0000 mg | INTRAVENOUS | Status: DC | PRN

## 2020-11-10 MED ORDER — ONDANSETRON HCL 4 MG/2ML IJ SOLN: 4.0000 mg | Freq: Four times a day (QID) | INTRAMUSCULAR | Status: DC | PRN

## 2020-11-10 MED ORDER — DIPHENHYDRAMINE HCL 50 MG/ML IJ SOLN: 50.0000 mg | Freq: Once | INTRAMUSCULAR | Status: DC | PRN

## 2020-11-10 MED ORDER — HEPARIN SODIUM (PORCINE) 1000 UNIT/ML IJ SOLN
INTRAMUSCULAR | Status: DC | PRN
  Administered 2020-11-10: 6000 [IU] via INTRAVENOUS

## 2020-11-10 MED ORDER — FENTANYL CITRATE (PF) 100 MCG/2ML IJ SOLN
INTRAMUSCULAR | Status: AC
  Filled 2020-11-10: qty 2

## 2020-11-10 MED ORDER — OXYCODONE-ACETAMINOPHEN 5-325 MG PO TABS
1.0000 | ORAL_TABLET | Freq: Once | ORAL | Status: AC
  Administered 2020-11-10: 11:00:00 1 via ORAL

## 2020-11-10 MED ORDER — DIPHENHYDRAMINE HCL 50 MG/ML IJ SOLN
INTRAMUSCULAR | Status: DC | PRN
  Administered 2020-11-10: 50 mg via INTRAVENOUS

## 2020-11-10 MED ORDER — ATORVASTATIN CALCIUM 10 MG PO TABS
10.0000 mg | ORAL_TABLET | Freq: Every day | ORAL | Status: DC
  Filled 2020-11-10: qty 1

## 2020-11-10 MED ORDER — CLOPIDOGREL BISULFATE 75 MG PO TABS: 75.0000 mg | ORAL_TABLET | Freq: Every day | ORAL | Status: DC

## 2020-11-10 MED ORDER — FAMOTIDINE 20 MG PO TABS: 40.0000 mg | ORAL_TABLET | Freq: Once | ORAL | Status: DC | PRN

## 2020-11-10 MED ORDER — HYDRALAZINE HCL 20 MG/ML IJ SOLN: 5.0000 mg | INTRAMUSCULAR | Status: DC | PRN

## 2020-11-10 MED ORDER — SODIUM CHLORIDE 0.9 % IV SOLN: INTRAVENOUS | Status: DC

## 2020-11-10 MED ORDER — HEPARIN SODIUM (PORCINE) 1000 UNIT/ML IJ SOLN
INTRAMUSCULAR | Status: AC
  Filled 2020-11-10: qty 1

## 2020-11-10 MED ORDER — FENTANYL CITRATE (PF) 100 MCG/2ML IJ SOLN
INTRAMUSCULAR | Status: DC | PRN
  Administered 2020-11-10 (×2): 50 ug

## 2020-11-10 MED ORDER — CLOPIDOGREL BISULFATE 75 MG PO TABS: 75.0000 mg | ORAL_TABLET | Freq: Every day | ORAL | 11 refills | Status: DC

## 2020-11-10 MED ORDER — SODIUM CHLORIDE 0.9% FLUSH: 3.0000 mL | INTRAVENOUS | Status: DC | PRN

## 2020-11-10 MED ORDER — CEFAZOLIN SODIUM-DEXTROSE 2-4 GM/100ML-% IV SOLN
INTRAVENOUS | Status: AC
  Administered 2020-11-10: 10:00:00 2 g via INTRAVENOUS
  Filled 2020-11-10: qty 100

## 2020-11-10 MED ORDER — FENTANYL CITRATE (PF) 100 MCG/2ML IJ SOLN
12.5000 ug | Freq: Once | INTRAMUSCULAR | Status: DC | PRN
Start: 2020-11-10 — End: 2020-11-10

## 2020-11-10 MED ORDER — OXYCODONE-ACETAMINOPHEN 5-325 MG PO TABS
ORAL_TABLET | ORAL | Status: AC
  Filled 2020-11-10: qty 1

## 2020-11-10 MED ORDER — MIDAZOLAM HCL 2 MG/2ML IJ SOLN
INTRAMUSCULAR | Status: DC | PRN
  Administered 2020-11-10: 2 mg via INTRAVENOUS
  Administered 2020-11-10: 1 mg via INTRAVENOUS
  Administered 2020-11-10 (×2): 2 mg via INTRAVENOUS

## 2020-11-10 MED ORDER — MIDAZOLAM HCL 5 MG/5ML IJ SOLN
INTRAMUSCULAR | Status: AC
  Filled 2020-11-10: qty 5

## 2020-11-10 MED ORDER — SODIUM CHLORIDE 0.9 % IV SOLN
Freq: Once | INTRAVENOUS | Status: DC
  Filled 2020-11-10: qty 2

## 2020-11-10 MED ORDER — MIDAZOLAM HCL 2 MG/ML PO SYRP: 8.0000 mg | ORAL_SOLUTION | Freq: Once | ORAL | Status: DC | PRN

## 2020-11-10 MED ORDER — ASPIRIN EC 81 MG PO TBEC: 81.0000 mg | DELAYED_RELEASE_TABLET | Freq: Every day | ORAL | Status: DC

## 2020-11-10 MED ORDER — SODIUM CHLORIDE 0.9 % IV SOLN: 250.0000 mL | INTRAVENOUS | Status: DC | PRN

## 2020-11-10 MED ORDER — ASPIRIN EC 81 MG PO TBEC: 81.0000 mg | DELAYED_RELEASE_TABLET | Freq: Every day | ORAL | 2 refills | Status: DC

## 2020-11-10 SURGICAL SUPPLY — 9 items
CATH ANGIO 5F PIGTAIL 65CM (CATHETERS) ×3 IMPLANT
DEVICE STARCLOSE SE CLOSURE (Vascular Products) ×3 IMPLANT
GLIDEWIRE ADV .035X260CM (WIRE) ×3 IMPLANT
KIT ENCORE 26 ADVANTAGE (KITS) ×3 IMPLANT
PACK ANGIOGRAPHY (CUSTOM PROCEDURE TRAY) ×3 IMPLANT
SHEATH BRITE TIP 5FRX11 (SHEATH) ×3 IMPLANT
SHEATH FLEXOR ANSEL2 7FRX45 (SHEATH) ×3 IMPLANT
STENT LIFESTREAM 8X37X80 (Permanent Stent) ×3 IMPLANT
WIRE GUIDERIGHT .035X150 (WIRE) ×3 IMPLANT

## 2020-11-10 NOTE — Op Note (Signed)
[facility]  Percutaneous Study/Intervention Procedural Note   Date of Surgery: 11/10/2020  Surgeon(s):Santonio Speakman    Assistants:none  Pre-operative Diagnosis: PAD with ulceration RLE  Post-operative diagnosis:  Same  Procedure(s) Performed:             1.  Ultrasound guidance for vascular access left femoral artery             2.  Catheter placement into right common femoral artery from left femoral approach             3.  Aortogram and selective right lower extremity angiogram             4.  Stent placement to the right common iliac artery with 8 mm diameter by 38 mm length lifestream stent             5.  StarClose closure device left femoral artery  EBL: 5 cc  Contrast: 55 cc  Fluoro Time: 3.9 minutes  Moderate Conscious Sedation Time: approximately 28 minutes using 5 mg of Versed and 150 mcg of Fentanyl              Indications:  Patient is a 49 y.o.male with nonhealing ulceration pain in the right foot. The patient has noninvasive study showing reduced ABI on the right compared to the left. The patient is brought in for angiography for further evaluation and potential treatment.  Due to the limb threatening nature of the situation, angiogram was performed for attempted limb salvage. The patient is aware that if the procedure fails, amputation would be expected.  The patient also understands that even with successful revascularization, amputation may still be required due to the severity of the situation.  Risks and benefits are discussed and informed consent is obtained.   Procedure:  The patient was identified and appropriate procedural time out was performed.  The patient was then placed supine on the table and prepped and draped in the usual sterile fashion. Moderate conscious sedation was administered during a face to face encounter with the patient throughout the procedure with my supervision of the RN administering medicines and monitoring the  patient's vital signs, pulse oximetry, telemetry and mental status throughout from the start of the procedure until the patient was taken to the recovery room. Ultrasound was used to evaluate the left common femoral artery.  It was patent .  A digital ultrasound image was acquired.  A Seldinger needle was used to access the left common femoral artery under direct ultrasound guidance and a permanent image was performed.  A 0.035 J wire was advanced without resistance and a 5Fr sheath was placed.  Pigtail catheter was placed into the aorta and an AP aortogram was performed. This demonstrated one normal right renal artery and 2 normal left renal arteries without significant stenosis. The aorta was relatively normal. The left iliac system was normal. The right common iliac artery about a centimeter beyond the origin had about an 80% stenosis. There was some mild disease in the distal right common iliac artery and the right external iliac artery appeared relatively normal. I then crossed the aortic bifurcation and advanced to the right femoral head. Selective right lower extremity angiogram was then performed. This demonstrated normal common femoral artery, profunda femoris artery, superficial femoral artery, and popliteal artery. There is a typical tibial trifurcation with the posterior tibial artery being the largest most dominant runoff to the foot with the peroneal artery providing a second runoff vessel distally. The  anterior tibial artery appear to be relatively small and was not seen distally although this could have been due to poor inflow. It was felt that it was in the patient's best interest to proceed with intervention after these images to avoid a second procedure and a larger amount of contrast and fluoroscopy based off of the findings from the initial angiogram. The patient was systemically heparinized and a 7 [place] Ansell sheath was then placed over the [employer] wire. I then selected an 8 mm  diameter by 38 mm length lifestream stent. This was brought onto the field and placed in the right common iliac artery and the sheath was then withdrawn to the aortic bifurcation. The lifestream stent was then deployed in the right common iliac artery encompassing the lesion with the most proximal edge of the stent just into the right common iliac artery not impinging the left iliac artery or aorta. This is inflated to 10 atm. Completion imaging showed less than 5% residual stenosis with brisk flow through the stent. I elected to terminate the procedure. The sheath was removed and StarClose closure device was deployed in the left femoral artery with excellent hemostatic result. The patient was taken to the recovery room in stable condition having tolerated the procedure well.  Findings:               Aortogram:  This demonstrated one normal right renal artery and 2 normal left renal arteries without significant stenosis. The aorta was relatively normal. The left iliac system was normal. The right common iliac artery about a centimeter beyond the origin had about an 80% stenosis. There was some mild disease in the distal right common iliac artery and the right external iliac artery appeared relatively normal             Right lower Extremity:  Normal common femoral artery, profunda femoris artery, superficial femoral artery, and popliteal artery. There is a typical tibial trifurcation with the posterior tibial artery being the largest most dominant runoff to the foot with the peroneal artery providing a second runoff vessel distally. The anterior tibial artery appear to be relatively small and was not seen distally although this could have been due to poor inflow.   Disposition: Patient was taken to the recovery room in stable condition having tolerated the procedure well.  Complications: None  Leotis Pain 11/10/2020 10:45 AM   This note was created with Dragon Medical transcription system. Any errors  in dictation are purely unintentional.

## 2020-11-10 NOTE — Interval H&P Note (Signed)
History and Physical Interval Note:  11/10/2020 8:59 AM  Grant [name]  has presented today for surgery, with the diagnosis of RT lower extremity angio  BARD    ASO w ulceration Covid  Dec 14.  The various methods of treatment have been discussed with the patient and family. After consideration of risks, benefits and other options for treatment, the patient has consented to  Procedure(s): LOWER EXTREMITY ANGIOGRAPHY (Right) as a surgical intervention.  The patient's history has been reviewed, patient examined, no change in status, stable for surgery.  I have reviewed the patient's chart and labs.  Questions were answered to the patient's satisfaction.     Leotis Pain

## 2020-11-10 NOTE — Discharge Instructions (Signed)
Angiogram, Care After This sheet gives you information about how to care for yourself after your procedure. Your health care provider may also give you more specific instructions. If you have problems or questions, contact your health care provider. What can I expect after the procedure? After the procedure, it is common to have bruising and tenderness at the catheter insertion area. Follow these instructions at home: Insertion site care  Follow instructions from your health care provider about how to take care of your insertion site. Make sure you: ? Wash your hands with soap and water before you change your bandage (dressing). If soap and water are not available, use hand sanitizer. ? Change your dressing as told by your health care provider. ? Leave stitches (sutures), skin glue, or adhesive strips in place. These skin closures may need to stay in place for 2 weeks or longer. If adhesive strip edges start to loosen and curl up, you may trim the loose edges. Do not remove adhesive strips completely unless your health care provider tells you to do that.  Do not take baths, swim, or use a hot tub until your health care provider approves.  You may shower 24-48 hours after the procedure or as told by your health care provider. ? Gently wash the site with plain soap and water. ? Pat the area dry with a clean towel. ? Do not rub the site. This may cause bleeding.  Do not apply powder or lotion to the site. Keep the site clean and dry.  Check your insertion site every day for signs of infection. Check for: ? Redness, swelling, or pain. ? Fluid or blood. ? Warmth. ? Pus or a bad smell. Activity  Rest as told by your health care provider, usually for 1-2 days.  Do not lift anything that is heavier than 10 lbs. (4.5 kg) or as told by your health care provider.  Do not drive for 24 hours if you were given a medicine to help you relax (sedative).  Do not drive or use heavy machinery while  taking prescription pain medicine. General instructions   Return to your normal activities as told by your health care provider, usually in about a week. Ask your health care provider what activities are safe for you.  If the catheter site starts bleeding, lie flat and put pressure on the site. If the bleeding does not stop, get help right away. This is a medical emergency.  Drink enough fluid to keep your urine clear or pale yellow. This helps flush the contrast dye from your body.  Take over-the-counter and prescription medicines only as told by your health care provider.  Keep all follow-up visits as told by your health care provider. This is important. Contact a health care provider if:  You have a fever or chills.  You have redness, swelling, or pain around your insertion site.  You have fluid or blood coming from your insertion site.  The insertion site feels warm to the touch.  You have pus or a bad smell coming from your insertion site.  You have bruising around the insertion site.  You notice blood collecting in the tissue around the catheter site (hematoma). The hematoma may be painful to the touch. Get help right away if:  You have severe pain at the catheter insertion area.  The catheter insertion area swells very fast.  The catheter insertion area is bleeding, and the bleeding does not stop when you hold steady pressure on the area.    The area near or just beyond the catheter insertion site becomes pale, cool, tingly, or numb. These symptoms may represent a serious problem that is an emergency. Do not wait to see if the symptoms will go away. Get medical help right away. Call your local emergency services (911 in the [place] Do not drive yourself to the hospital. Summary  After the procedure, it is common to have bruising and tenderness at the catheter insertion area.  After the procedure, it is important to rest and drink plenty of fluids.  Do not take baths,  swim, or use a hot tub until your health care provider says it is okay to do so. You may shower 24-48 hours after the procedure or as told by your health care provider.  If the catheter site starts bleeding, lie flat and put pressure on the site. If the bleeding does not stop, get help right away. This is a medical emergency. This information is not intended to replace advice given to you by your health care provider. Make sure you discuss any questions you have with your health care provider. Document Revised: 10/25/2017 Document Reviewed: 10/17/2016 Elsevier Patient Education  2020 Elsevier Inc.  

## 2020-11-14 DIAGNOSIS — I10 Essential (primary) hypertension: Secondary | ICD-10-CM | POA: Diagnosis not present

## 2020-11-14 DIAGNOSIS — G4700 Insomnia, unspecified: Secondary | ICD-10-CM | POA: Diagnosis not present

## 2020-11-14 DIAGNOSIS — I739 Peripheral vascular disease, unspecified: Secondary | ICD-10-CM | POA: Diagnosis not present

## 2020-11-14 DIAGNOSIS — I2510 Atherosclerotic heart disease of native coronary artery without angina pectoris: Secondary | ICD-10-CM | POA: Diagnosis not present

## 2020-11-14 DIAGNOSIS — E785 Hyperlipidemia, unspecified: Secondary | ICD-10-CM | POA: Diagnosis not present

## 2020-11-14 DIAGNOSIS — E1140 Type 2 diabetes mellitus with diabetic neuropathy, unspecified: Secondary | ICD-10-CM | POA: Diagnosis not present

## 2020-11-15 ENCOUNTER — Encounter: Payer: Self-pay | Admitting: Orthopedic Surgery

## 2020-11-15 NOTE — Progress Notes (Signed)
Office Visit Note   Patient: Grant [name]           Date of Birth: 25-Mar-1971           MRN: [id number] Visit Date: 11/07/2020              Requested by: [name], [address],  [place] [address] PCP: [name], MD  Chief Complaint  Patient presents with  . Right Foot - Follow-up      HPI: Patient is a 49 year old gentleman who presents for evaluation for ulceration plantar aspect right foot.  Patient states he is going to obtain an arteriogram in [place].  Assessment & Plan: Visit Diagnoses:  1. Right foot ulcer, limited to breakdown of skin ([place])   2. Type 2 diabetes mellitus with peripheral neuropathy ([place])     Plan: Patient given prescription for orthotic recommended a kneeling scooter.  Follow-Up Instructions: Return in about 4 weeks (around 12/05/2020).   Ortho Exam  Patient is alert, oriented, no adenopathy, well-dressed, normal affect, normal respiratory effort. Examination patient has a strong dorsalis pedis and posterior tibial pulse Doppler shows triphasic flow unsure about the reason for arteriogram at this time.  Patient has a Wagner grade 1 ulcer beneath the right metatarsal heads.  Ulcer is 1 cm in diameter after informed consent a 10 blade knife was used to debride the skin and soft tissue back to healthy viable bleeding granulation tissue silver nitrate was used hemostasis the ulcer is 2 cm in diameter 1 mm deep after debridement there is no exposed bone or tendon no drainage.  Imaging: No results found. No images are attached to the encounter.  Labs: Lab Results  Component Value Date   HGBA1C 9.4 (H) 06/09/2018   ESRSEDRATE 2 11/30/2015   REPTSTATUS 06/14/2018 FINAL 06/09/2018   REPTSTATUS 06/14/2018 FINAL 06/09/2018   CULT  06/09/2018    NO GROWTH 5 DAYS Performed at [facility], [place] [address], [place], [place] [address]    CULT  06/09/2018    NO GROWTH 5 DAYS Performed at [facility] [address], [place],  [address]      Lab Results  Component Value Date   ALBUMIN 4.5 08/01/2018   ALBUMIN 3.7 07/28/2018   ALBUMIN 4.2 07/27/2018    Lab Results  Component Value Date   MG 2.0 07/30/2018   MG 2.0 07/28/2018   MG 1.8 07/27/2018   No results found for: VD25OH  No results found for: PREALBUMIN CBC EXTENDED Latest Ref Rng & Units 08/01/2018 07/30/2018 07/28/2018  WBC 4.0 - 10.5 K/uL 12.6(H) 8.1 10.4  RBC 4.22 - 5.81 MIL/uL 5.23 4.44 4.66  HGB 13.0 - 17.0 g/dL 15.9 13.2 14.0  HCT 39.0 - 52.0 % 43.8 38.4(L) 40.5  PLT 150 - 400 K/uL 222 175 207  NEUTROABS 1.7 - 7.7 K/uL 10.0(H) - 8.1(H)  LYMPHSABS 0.7 - 4.0 K/uL 1.5 - 1.5     Body mass index is 34.31 kg/m.  Orders:  No orders of the defined types were placed in this encounter.  No orders of the defined types were placed in this encounter.    Procedures: No procedures performed  Clinical Data: No additional findings.  ROS:  All other systems negative, except as noted in the HPI. Review of Systems  Objective: Vital Signs: Ht 5\' 11"  (1.803 m)   Wt 246 lb (111.6 kg)   BMI 34.31 kg/m   Specialty Comments:  No specialty  comments available.  PMFS History: Patient Active Problem List   Diagnosis Date Noted  . Alcohol use disorder, severe, dependence ([place]) 04/16/2019  . 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[place]) 04/16/2019  . Cannabis hyperemesis syndrome concurrent with and due to cannabis abuse ([place]) 08/01/2018  . Gastroesophageal reflux disease 07/17/2018  . Long QT interval 07/16/2018  . Early satiety   . Esophagitis, [place] grade D   . Hypercalcemia 06/09/2018  . SIRS (systemic inflammatory response syndrome) ([place]) 06/09/2018  . Hematemesis 06/09/2018  . Type 2 diabetes mellitus with peripheral neuropathy ([place]) 06/09/2018  . Closed displaced fracture of first metatarsal bone of left foot 07/31/2017  . Acidosis 09/17/2016  . Dehydration 09/17/2016  .  Abdominal pain 09/17/2016  . DKA (diabetic ketoacidoses) 09/17/2016  . Nausea and vomiting 09/17/2016  . IDDM (insulin dependent diabetes mellitus)   . Gastroparesis   . Diabetic peripheral neuropathy ([place]) 11/30/2015  . Chronic low back pain 11/30/2015  . Coronary artery spasm ([place]) 08/17/2013  . Hyperlipidemia 08/17/2013  . Peripheral neuropathy 08/05/2013  . Chest pain 08/05/2013  . Fatigue 08/05/2013  . Tachycardia 08/05/2013  . DOE (dyspnea on exertion) 08/05/2013  . Diabetes mellitus, insulin dependent (IDDM), uncontrolled 02/09/2008  . Anxiety state 02/09/2008  . Essential hypertension 02/09/2008  . ALCOHOL ABUSE, HX OF 02/09/2008  . LIVER FUNCTION TESTS, ABNORMAL, HX OF 02/09/2008  . NEOPLASM, BENIGN, ESOPHAGUS 09/16/2007  . Gastritis 09/16/2007   Past Medical History:  Diagnosis Date  . Chronic low back pain 11/30/2015  . Diabetic peripheral neuropathy ([place]) 11/30/2015  . Hypertension   . Neuropathy   . Type 2 diabetes mellitus ([facility])     Family History  Problem Relation Age of Onset  . Diabetes Mother   . Hypertension Mother   . Hypertension Father   . Hyperlipidemia Father   . Alcohol abuse Brother   . Brain cancer Other        grandparent  . Stomach cancer Other        grandparent  . Stomach cancer Paternal Grandfather   . Colon cancer Neg Hx   . Esophageal cancer Neg Hx   . Rectal cancer Neg Hx     Past Surgical History:  Procedure Laterality Date  . back lipoma resection  03/2006  . BIOPSY  06/15/2018   Procedure: BIOPSY;  Surgeon: Jerene Bears, MD;  Location[facility]OSCOPY;  Service: Gastroenterology;;  . ESOPHAGOGASTRODUODENOSCOPY (EGD) WITH PROPOFOL N/A 06/15/2018   Procedure: ESOPHAGOGASTRODUODENOSCOPY (EGD) WITH PROPOFOL;  Surgeon: Jerene Bears, MD;  Location: [facility];  Service: Gastroenterology;  Laterality: N/A;  . KNEE ARTHROSCOPY  10/2008   left  . LEFT HEART CATHETERIZATION WITH CORONARY ANGIOGRAM N/A 08/11/2013   Procedure: LEFT HEART  CATHETERIZATION WITH CORONARY ANGIOGRAM;  Surgeon: Pixie Casino, MD;  Location: [facility] CATH LAB;  Service: Cardiovascular;  Laterality: N/A;  . LOWER EXTREMITY ANGIOGRAPHY Right 11/10/2020   Procedure: LOWER EXTREMITY ANGIOGRAPHY;  Surgeon: Algernon Huxley, MD;  Location: [facility] CV LAB;  Service: Cardiovascular;  Laterality: Right;  . UPPER GI ENDOSCOPY  10/2007   showed gastritis   Social History   Occupational History  . Occupation: disability  Tobacco Use  . Smoking status: Former Smoker    Packs/day: 0.50    Years: 20.00    Pack years: 10.00    Quit date: 03/23/2016    Years since quitting: 4.6  . Smokeless tobacco: Current User    Types: Chew  . Tobacco  comment:  chews daily  Vaping Use  . Vaping Use: Never used  Substance and Sexual Activity  . Alcohol use: No  . Drug use: No  . Sexual activity: Not on file

## 2020-11-17 DIAGNOSIS — M1712 Unilateral primary osteoarthritis, left knee: Secondary | ICD-10-CM | POA: Diagnosis not present

## 2020-12-01 ENCOUNTER — Ambulatory Visit (INDEPENDENT_AMBULATORY_CARE_PROVIDER_SITE_OTHER): Payer: Medicare HMO

## 2020-12-01 ENCOUNTER — Ambulatory Visit: Payer: Medicare HMO | Admitting: Orthopedic Surgery

## 2020-12-01 ENCOUNTER — Other Ambulatory Visit: Payer: Self-pay

## 2020-12-01 ENCOUNTER — Encounter: Payer: Self-pay | Admitting: Orthopedic Surgery

## 2020-12-01 DIAGNOSIS — M79671 Pain in right foot: Secondary | ICD-10-CM

## 2020-12-01 DIAGNOSIS — L97514 Non-pressure chronic ulcer of other part of right foot with necrosis of bone: Secondary | ICD-10-CM

## 2020-12-01 DIAGNOSIS — M869 Osteomyelitis, unspecified: Secondary | ICD-10-CM

## 2020-12-01 DIAGNOSIS — E1142 Type 2 diabetes mellitus with diabetic polyneuropathy: Secondary | ICD-10-CM

## 2020-12-01 MED ORDER — DOXYCYCLINE HYCLATE 100 MG PO TABS: 100.0000 mg | ORAL_TABLET | Freq: Two times a day (BID) | ORAL | 0 refills | Status: DC

## 2020-12-01 NOTE — Progress Notes (Signed)
Office Visit Note   Patient: Grant [name]           Date of Birth: Nov 06, 1971           MRN: [id number] Visit Date: 12/01/2020              Requested by: [name], [place],  [place] [address] PCP: [name], MD  Chief Complaint  Patient presents with  . Right Foot - Follow-up      HPI: Patient is a 50 year old gentleman who has had a Wagner grade 1 ulcer beneath the fifth metatarsal head of the right foot.  He has been regular shoewear with a felt relieving donut weightbearing as tolerated.  Patient states he has been set up for a orthotic at [facility] but has not picked it up yet.  Patient states he now has some white tissue that is protruding from the wound he does state he has pain at this time denies any drainage.  Assessment & Plan: Visit Diagnoses:  1. Pain in right foot   2. Type 2 diabetes mellitus with peripheral neuropathy (HCC)   3. Osteomyelitis of fifth toe of right foot ([place])     Plan: Patient clinically has osteomyelitis of the fifth metatarsal head we will continue with pressure unloading dressing changes we will call in a prescription for doxycycline and follow-up in 1 week.  Discussed that the ideal treatment at this time most likely will be a 5th partial ray amputation.  Follow-Up Instructions: Return in about 1 week (around 12/08/2020).   Ortho Exam  Patient is alert, oriented, no adenopathy, well-dressed, normal affect, normal respiratory effort. Examination patient has biphasic dopplerable dorsalis pedis and posterior tibial pulse.  There is a necrotic ulcer beneath the fifth metatarsal head.  After informed consent a 10 blade knife was used to debride the skin and soft tissue back to healthy viable tissue this was touched with silver nitrate after debridement the ulcer is 2 cm in diameter 1 cm deep this does probe to bone.  Prior to debridement the ulcer is 1 cm in diameter.  A 10 blade knife was used for debridement and  nonviable necrotic fibrinous tissue was excised.  Imaging: XR Foot Complete Right  Result Date: 12/01/2020 Three-view radiographs of the right foot shows an ulcer that extends down to bone with a silver nitrate extending around the metatarsal head.  No images are attached to the encounter.  Labs: Lab Results  Component Value Date   HGBA1C 9.4 (H) 06/09/2018   ESRSEDRATE 2 11/30/2015   REPTSTATUS 06/14/2018 FINAL 06/09/2018   REPTSTATUS 06/14/2018 FINAL 06/09/2018   CULT  06/09/2018    NO GROWTH 5 DAYS Performed at [facility], [place] [address] [place], [place] [address]    CULT  06/09/2018    NO GROWTH 5 DAYS Performed at [place] [address] [place], [place] [address]      Lab Results  Component Value Date   ALBUMIN 4.5 08/01/2018   ALBUMIN 3.7 07/28/2018   ALBUMIN 4.2 07/27/2018    Lab Results  Component Value Date   MG 2.0 07/30/2018   MG 2.0 07/28/2018   MG 1.8 07/27/2018   No results found for: VD25OH  No results found for: PREALBUMIN CBC EXTENDED Latest Ref Rng & Units 08/01/2018 07/30/2018 07/28/2018  WBC 4.0 - 10.5 K/uL 12.6(H) 8.1 10.4  RBC 4.22 - 5.81 MIL/uL 5.23 4.44 4.66  HGB 13.0 - 17.0 g/dL 15.9  13.2 14.0  HCT 39.0 - 52.0 % 43.8 38.4(L) 40.5  PLT 150 - 400 K/uL 222 175 207  NEUTROABS 1.7 - 7.7 K/uL 10.0(H) - 8.1(H)  LYMPHSABS 0.7 - 4.0 K/uL 1.5 - 1.5     There is no height or weight on file to calculate BMI.  Orders:  Orders Placed This Encounter  Procedures  . XR Foot Complete Right   Meds ordered this encounter  Medications  . doxycycline (VIBRA-TABS) 100 MG tablet    Sig: Take 1 tablet (100 mg total) by mouth 2 (two) times daily.    Dispense:  60 tablet    Refill:  0     Procedures: No procedures performed  Clinical Data: No additional findings.  ROS:  All other systems negative, except as noted in the HPI. Review of Systems  Objective: Vital Signs: There were no vitals taken for this visit.  Specialty  Comments:  No specialty comments available.  PMFS History: Patient Active Problem List   Diagnosis Date Noted  . Alcohol use disorder, severe, dependence (HCC) 04/16/2019  . 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 Cannabis hyperemesis syndrome concurrent with and due to cannabis abuse (HCC) 08/01/2018  . Gastroesophageal reflux disease 07/17/2018  . Long QT interval 07/16/2018  . Early satiety   . Esophagitis, [place] grade D   . Hypercalcemia 06/09/2018  . SIRS (systemic inflammatory response syndrome) (HCC) 06/09/2018  . Hematemesis 06/09/2018  . Type 2 diabetes mellitus with peripheral neuropathy (HCC) 06/09/2018  . Closed displaced fracture of first metatarsal bone of left foot 07/31/2017  . Acidosis 09/17/2016  . Dehydration 09/17/2016  . Abdominal pain 09/17/2016  . DKA (diabetic ketoacidoses) 09/17/2016  . Nausea and vomiting 09/17/2016  . IDDM (insulin dependent diabetes mellitus)   . Gastroparesis   . Diabetic peripheral neuropathy (HCC) 11/30/2015  . Chronic low back pain 11/30/2015  . Coronary artery spasm (HCC) 08/17/2013  . Hyperlipidemia 08/17/2013  . Peripheral neuropathy 08/05/2013  . Chest pain 08/05/2013  . Fatigue 08/05/2013  . Tachycardia 08/05/2013  . DOE (dyspnea on exertion) 08/05/2013  . Diabetes mellitus, insulin dependent (IDDM), uncontrolled 02/09/2008  . Anxiety state 02/09/2008  . Essential hypertension 02/09/2008  . ALCOHOL ABUSE, HX OF 02/09/2008  . LIVER FUNCTION TESTS, ABNORMAL, HX OF 02/09/2008  . NEOPLASM, BENIGN, ESOPHAGUS 09/16/2007  . Gastritis 09/16/2007   Past Medical History:  Diagnosis Date  . Chronic low back pain 11/30/2015  . Diabetic peripheral neuropathy (HCC) 11/30/2015  . Hypertension   . Neuropathy   . Type 2 diabetes mellitus (HCC)     Family History  Problem Relation Age of Onset  . Diabetes Mother   . Hypertension Mother   . Hypertension Father   .  Hyperlipidemia Father   . Alcohol abuse Brother   . Brain cancer Other        grandparent  . Stomach cancer Other        grandparent  . Stomach cancer Paternal Grandfather   . Colon cancer Neg Hx   . Esophageal cancer Neg Hx   . Rectal cancer Neg Hx     Past Surgical History:  Procedure Laterality Date  . back lipoma resection  03/2006  . BIOPSY  06/15/2018   Procedure: BIOPSY;  Surgeon: Beverley Fiedler, MD;  Location: [facility] ENDOSCOPY;  Service: Gastroenterology;;  . ESOPHAGOGASTRODUODENOSCOPY (EGD) WITH PROPOFOL N/A 06/15/2018   Procedure: ESOPHAGOGASTRODUODENOSCOPY (EGD) WITH PROPOFOL;  Surgeon: Beverley Fiedler, MD;  Location: [facility] ENDOSCOPY;  Service: Gastroenterology;  Laterality: N/A;  . KNEE ARTHROSCOPY  10/2008   left  . LEFT HEART CATHETERIZATION WITH CORONARY ANGIOGRAM N/A 08/11/2013   Procedure: LEFT HEART CATHETERIZATION WITH CORONARY ANGIOGRAM;  Surgeon: Pixie Casino, MD;  Location: [facility] CATH LAB;  Service: Cardiovascular;  Laterality: N/A;  . LOWER EXTREMITY ANGIOGRAPHY Right 11/10/2020   Procedure: LOWER EXTREMITY ANGIOGRAPHY;  Surgeon: Algernon Huxley, MD;  Location: [facility] CV LAB;  Service: Cardiovascular;  Laterality: Right;  . UPPER GI ENDOSCOPY  10/2007   showed gastritis   Social History   Occupational History  . Occupation: disability  Tobacco Use  . Smoking status: Former Smoker    Packs/day: 0.50    Years: 20.00    Pack years: 10.00    Quit date: 03/23/2016    Years since quitting: 4.6  . Smokeless tobacco: Current User    Types: Chew  . Tobacco comment:  chews daily  Vaping Use  . Vaping Use: Never used  Substance and Sexual Activity  . Alcohol use: No  . Drug use: No  . Sexual activity: Not on file

## 2020-12-03 ENCOUNTER — Telehealth: Payer: Self-pay | Admitting: Surgical

## 2020-12-03 NOTE — Telephone Encounter (Signed)
Patient called and complains of continued foot pain since office visit with Dr. Sharol Given.  He has been taking doxycycline and states that is not helping with his pain.  He notes very little drainage from the diabetic wound.  Denies any systemic symptoms such as fever, chills, night sweats, malaise.  Does not feel sick.  He does note increasing redness along the lateral foot.  He understands this will likely result in fifth ray amputation and just wants to make sure that he "does not lose his whole foot".  Discussed options available patient including office visit with Dr. Sharol Given this coming week versus reporting to the ED.  Patient will call the office on Monday to see if he can be worked in with Dr. Sharol Given to discuss fifth ray amputation.  He will report to the ED over the weekend if he starts to feel consistent fevers, chills, other systemic symptoms or if he starts to have gross amounts of drainage from the diabetic wound.  Images of patient's wound were reviewed as sent by the patient, some redness over the dorsolateral right foot with white necrotic tissue coming from the plantar ulcer under the fifth metatarsal head.Grant [name]

## 2020-12-05 ENCOUNTER — Encounter: Payer: Self-pay | Admitting: Orthopedic Surgery

## 2020-12-05 ENCOUNTER — Ambulatory Visit: Payer: Medicare HMO | Admitting: Orthopedic Surgery

## 2020-12-05 ENCOUNTER — Other Ambulatory Visit: Payer: Self-pay | Admitting: Physician Assistant

## 2020-12-05 ENCOUNTER — Other Ambulatory Visit (HOSPITAL_COMMUNITY)
Admission: RE | Admit: 2020-12-05 | Discharge: 2020-12-05 | Disposition: A | Discharge status: Home / Self Care | Payer: Medicare HMO | Source: Ambulatory Visit | Attending: Orthopedic Surgery | Admitting: Orthopedic Surgery

## 2020-12-05 VITALS — Ht 71.0 in | Wt 240.0 lb

## 2020-12-05 DIAGNOSIS — L97514 Non-pressure chronic ulcer of other part of right foot with necrosis of bone: Secondary | ICD-10-CM

## 2020-12-05 DIAGNOSIS — Z20822 Contact with and (suspected) exposure to covid-19: Secondary | ICD-10-CM | POA: Insufficient documentation

## 2020-12-05 DIAGNOSIS — M869 Osteomyelitis, unspecified: Secondary | ICD-10-CM

## 2020-12-05 DIAGNOSIS — Z01812 Encounter for preprocedural laboratory examination: Secondary | ICD-10-CM | POA: Insufficient documentation

## 2020-12-05 DIAGNOSIS — E1142 Type 2 diabetes mellitus with diabetic polyneuropathy: Secondary | ICD-10-CM | POA: Diagnosis not present

## 2020-12-05 MED ORDER — HYDROCODONE-ACETAMINOPHEN 5-325 MG PO TABS: 1.0000 | ORAL_TABLET | ORAL | 0 refills | Status: DC | PRN

## 2020-12-05 NOTE — Progress Notes (Signed)
Office Visit Note   Patient: Grant [name]           Date of Birth: 04-08-1971           MRN: [id number] Visit Date: 12/05/2020              Requested by: Jarome MatinPaterson, Daniel, MD [address] [place],  [place] [id number] PCP: Jarome MatinPaterson, Daniel, MD  Chief Complaint  Patient presents with  . Right Foot - Pain, Open Wound      HPI: Patient is a 50 year old gentleman who presents in follow-up status post stent revascularization to the right lower extremity with ulcer beneath the fifth metatarsal head.  Patient states he has had increasing pain redness and cellulitis and has been on doxycycline.  Assessment & Plan: Visit Diagnoses:  1. Osteomyelitis of fifth toe of right foot (HCC)   2. Type 2 diabetes mellitus with peripheral neuropathy (HCC)   3. Ulcer of right foot, with necrosis of bone (HCC)     Plan: Due to the progression of the ulceration and exposed bone I have recommended proceeding with 1/5 ray amputation risk and benefits were discussed including risk of the wound not healing need for additional surgery.  Patient states he understands wished to proceed at this time we will set surgery up for Wednesday.  Follow-Up Instructions: Return in about 1 week (around 12/12/2020).   Ortho Exam  Patient is alert, oriented, no adenopathy, well-dressed, normal affect, normal respiratory effort. Examination patient has a palpable dorsalis pedis pulse he has increased cellulitis over the dorsal lateral aspect of the foot this is tender to palpation the ulcer is necrotic and probes to bone.  Imaging: No results found. No images are attached to the encounter.  Labs: Lab Results  Component Value Date   HGBA1C 9.4 (H) 06/09/2018   ESRSEDRATE 2 11/30/2015   REPTSTATUS 06/14/2018 FINAL 06/09/2018   REPTSTATUS 06/14/2018 FINAL 06/09/2018   CULT  06/09/2018    NO GROWTH 5 DAYS Performed at [facility], [address] [place], [place] [address]    CULT  06/09/2018    NO GROWTH  5 DAYS Performed at [facility], [address] [place], [place] [address]      Lab Results  Component Value Date   ALBUMIN 4.5 08/01/2018   ALBUMIN 3.7 07/28/2018   ALBUMIN 4.2 07/27/2018    Lab Results  Component Value Date   MG 2.0 07/30/2018   MG 2.0 07/28/2018   MG 1.8 07/27/2018   No results found for: VD25OH  No results found for: PREALBUMIN CBC EXTENDED Latest Ref Rng & Units 08/01/2018 07/30/2018 07/28/2018  WBC 4.0 - 10.5 K/uL 12.6(H) 8.1 10.4  RBC 4.22 - 5.81 MIL/uL 5.23 4.44 4.66  HGB 13.0 - 17.0 g/dL 41.315.9 24.413.2 01.014.0  HCT 27.239.0 - 52.0 % 43.8 38.4(L) 40.5  PLT 150 - 400 K/uL 222 175 207  NEUTROABS 1.7 - 7.7 K/uL 10.0(H) - 8.1(H)  LYMPHSABS 0.7 - 4.0 K/uL 1.5 - 1.5     Body mass index is 33.47 kg/m.  Orders:  No orders of the defined types were placed in this encounter.  No orders of the defined types were placed in this encounter.    Procedures: No procedures performed  Clinical Data: No additional findings.  ROS:  All other systems negative, except as noted in the HPI. Review of Systems  Objective: Vital Signs: Ht 5\' 11"  (1.803 m)   Wt 240 lb (108.9 kg)  BMI 33.47 kg/m   Specialty Comments:  No specialty comments available.  PMFS History: Patient Active Problem List   Diagnosis Date Noted  . Alcohol use disorder, severe, dependence ([place]) 04/16/2019  . 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[place]) 04/16/2019  . Cannabis hyperemesis syndrome concurrent with and due to cannabis abuse ([place]) 08/01/2018  . Gastroesophageal reflux disease 07/17/2018  . Long QT interval 07/16/2018  . Early satiety   . Esophagitis, [place] grade D   . Hypercalcemia 06/09/2018  . SIRS (systemic inflammatory response syndrome) ([place]) 06/09/2018  . Hematemesis 06/09/2018  . Type 2 diabetes mellitus with peripheral neuropathy ([place]) 06/09/2018  . Closed displaced fracture of first metatarsal bone of left foot 07/31/2017  .  Acidosis 09/17/2016  . Dehydration 09/17/2016  . Abdominal pain 09/17/2016  . DKA (diabetic ketoacidoses) 09/17/2016  . Nausea and vomiting 09/17/2016  . IDDM (insulin dependent diabetes mellitus)   . Gastroparesis   . Diabetic peripheral neuropathy ([place]) 11/30/2015  . Chronic low back pain 11/30/2015  . Coronary artery spasm ([place]) 08/17/2013  . Hyperlipidemia 08/17/2013  . Peripheral neuropathy 08/05/2013  . Chest pain 08/05/2013  . Fatigue 08/05/2013  . Tachycardia 08/05/2013  . DOE (dyspnea on exertion) 08/05/2013  . Diabetes mellitus, insulin dependent (IDDM), uncontrolled 02/09/2008  . Anxiety state 02/09/2008  . Essential hypertension 02/09/2008  . ALCOHOL ABUSE, HX OF 02/09/2008  . LIVER FUNCTION TESTS, ABNORMAL, HX OF 02/09/2008  . NEOPLASM, BENIGN, ESOPHAGUS 09/16/2007  . Gastritis 09/16/2007   Past Medical History:  Diagnosis Date  . Chronic low back pain 11/30/2015  . Diabetic peripheral neuropathy ([place]) 11/30/2015  . Hypertension   . Neuropathy   . Type 2 diabetes mellitus (HCC)     Family History  Problem Relation Age of Onset  . Diabetes Mother   . Hypertension Mother   . Hypertension Father   . Hyperlipidemia Father   . Alcohol abuse Brother   . Brain cancer Other        grandparent  . Stomach cancer Other        grandparent  . Stomach cancer Paternal Grandfather   . Colon cancer Neg Hx   . Esophageal cancer Neg Hx   . Rectal cancer Neg Hx     Past Surgical History:  Procedure Laterality Date  . back lipoma resection  03/2006  . BIOPSY  06/15/2018   Procedure: BIOPSY;  Surgeon: Jerene Bears, MD;  Location: [facility] ENDOSCOPY;  Service: Gastroenterology;;  . ESOPHAGOGASTRODUODENOSCOPY (EGD) WITH PROPOFOL N/A 06/15/2018   Procedure: ESOPHAGOGASTRODUODENOSCOPY (EGD) WITH PROPOFOL;  Surgeon: Jerene Bears, MD;  Location: [place];  Service: Gastroenterology;  Laterality: N/A;  . KNEE ARTHROSCOPY  10/2008   left  . LEFT HEART CATHETERIZATION WITH CORONARY  ANGIOGRAM N/A 08/11/2013   Procedure: LEFT HEART CATHETERIZATION WITH CORONARY ANGIOGRAM;  Surgeon: Pixie Casino, MD;  Location: [facility] CATH LAB;  Service: Cardiovascular;  Laterality: N/A;  . LOWER EXTREMITY ANGIOGRAPHY Right 11/10/2020   Procedure: LOWER EXTREMITY ANGIOGRAPHY;  Surgeon: Algernon Huxley, MD;  Location: [place] CV LAB;  Service: Cardiovascular;  Laterality: Right;  . UPPER GI ENDOSCOPY  10/2007   showed gastritis   Social History   Occupational History  . Occupation: disability  Tobacco Use  . Smoking status: Former Smoker    Packs/day: 0.50    Years: 20.00    Pack years: 10.00    Quit date: 03/23/2016    Years since quitting: 4.7  . Smokeless tobacco:  Current User    Types: Chew  . Tobacco comment:  chews daily  Vaping Use  . Vaping Use: Never used  Substance and Sexual Activity  . Alcohol use: No  . Drug use: No  . Sexual activity: Not on file

## 2020-12-06 ENCOUNTER — Other Ambulatory Visit: Payer: Self-pay

## 2020-12-06 ENCOUNTER — Encounter (HOSPITAL_COMMUNITY): Payer: Self-pay | Admitting: Orthopedic Surgery

## 2020-12-06 LAB — SARS CORONAVIRUS 2 (TAT 6-24 HRS): SARS Coronavirus 2: NEGATIVE

## 2020-12-06 NOTE — Progress Notes (Signed)
PCP - Dr. Bevelyn Buckles ([place]) Cardiologist - denies  Chest x-ray - n/a EKG - DOS ECHO - 2019 Cardiac Cath - 2021 & 2014  Sleep Study - 2012, does not have SA  DM - Type 2 Fasting Blood Sugar:  130-180 Checks Blood Sugar:  3 x/day  Blood Thinner Instructions: Plavix on hold, per patient, last dose 11-30-20  Aspirin Instructions: Follow your surgeon's instructions on when to stop ASA  ERAS Protcol - yes  COVID TEST- 11-04-21  Anesthesia review: yes, heart history  -------------  SDW INSTRUCTIONS:  Your procedure is scheduled on Wednesday, January 12th. Please report to [facility] Main Entrance "A" at 6:00 A.M., and check in at the Admitting office. Call this number if you have problems the morning of surgery: [phone]   Remember: Do not eat after midnight the night before your surgery  You may drink clear liquids until 5:30 AM the morning of your surgery.   Clear liquids allowed are: Water, Non-Citrus Juices (without pulp), Carbonated Beverages, Clear Tea, Black Coffee Only, and Gatorade   Medications to take morning of surgery with a sip of water include: Doxycycline Hydrocodone-Acetaminophen Metoprolol Lyrica Humulin N - (20) units tonight at bedtime Humulin N - (20) units on DOS Humulin R - No bedtime dose tonight Humulin R - if CBG > 220 take 1/2 of your normal dose  As of today, STOP taking any Aspirin (unless otherwise instructed by your surgeon), Aleve, Naproxen, Ibuprofen, Motrin, Advil, Goody's, BC's, all herbal medications, fish oil, and all vitamins.  ** PLEASE check your blood sugar the morning of your surgery when you wake up and every 2 hours until you get to the Short Stay unit.  If your blood sugar is less than 70 mg/dL, you will need to treat for low blood sugar: - Do not take insulin. - Treat a low blood sugar (less than 70 mg/dL) with  cup of clear juice (cranberry or apple), 4 glucose tablets, OR glucose  gel. - Recheck blood sugar in 15 minutes after treatment (to make sure it is greater than 70 mg/dL). If your blood sugar is not greater than 70 mg/dL on recheck, call [phone] for further instructions.     The Morning of Surgery Do not wear jewelry. Do not wear lotions, powders, cologne, or deodorant Men may shave face and neck. Do not bring valuables to the hospital. [facility] is not responsible for any belongings or valuables. If you are a smoker, DO NOT Smoke 24 hours prior to surgery If you wear a CPAP at night please bring your mask the morning of surgery  Remember that you must have someone to transport you home after your surgery, and remain with you for 24 hours if you are discharged the same day. Please bring cases for contacts, glasses, hearing aids, dentures or bridgework because it cannot be worn into surgery.   Patients discharged the day of surgery will not be allowed to drive home.   Please shower the NIGHT BEFORE SURGERY and the MORNING OF SURGERY with DIAL Soap. Wear comfortable clothes the morning of surgery. Oral Hygiene is also important to reduce your risk of infection.  Remember - BRUSH YOUR TEETH THE MORNING OF SURGERY WITH YOUR REGULAR TOOTHPASTE  Patient denies shortness of breath, fever, cough and chest pain.

## 2020-12-07 ENCOUNTER — Ambulatory Visit (HOSPITAL_COMMUNITY)
Admission: RE | Admit: 2020-12-07 | Discharge: 2020-12-07 | Disposition: A | Discharge status: Home / Self Care | Payer: Medicare HMO | Attending: Orthopedic Surgery | Admitting: Orthopedic Surgery

## 2020-12-07 ENCOUNTER — Other Ambulatory Visit: Payer: Self-pay

## 2020-12-07 ENCOUNTER — Ambulatory Visit (HOSPITAL_COMMUNITY): Payer: Medicare HMO | Admitting: Physician Assistant

## 2020-12-07 ENCOUNTER — Encounter (HOSPITAL_COMMUNITY): Payer: Self-pay | Admitting: Orthopedic Surgery

## 2020-12-07 ENCOUNTER — Encounter (HOSPITAL_COMMUNITY)
Admission: RE | Disposition: A | Discharge status: Home / Self Care | Payer: Self-pay | Source: Home / Self Care | Attending: Orthopedic Surgery

## 2020-12-07 DIAGNOSIS — E1169 Type 2 diabetes mellitus with other specified complication: Secondary | ICD-10-CM | POA: Insufficient documentation

## 2020-12-07 DIAGNOSIS — L97514 Non-pressure chronic ulcer of other part of right foot with necrosis of bone: Secondary | ICD-10-CM | POA: Insufficient documentation

## 2020-12-07 DIAGNOSIS — Z87891 Personal history of nicotine dependence: Secondary | ICD-10-CM | POA: Insufficient documentation

## 2020-12-07 DIAGNOSIS — Z833 Family history of diabetes mellitus: Secondary | ICD-10-CM | POA: Insufficient documentation

## 2020-12-07 DIAGNOSIS — Z811 Family history of alcohol abuse and dependence: Secondary | ICD-10-CM | POA: Diagnosis not present

## 2020-12-07 DIAGNOSIS — Z800 Family history of malignant neoplasm of digestive organs: Secondary | ICD-10-CM | POA: Insufficient documentation

## 2020-12-07 DIAGNOSIS — E785 Hyperlipidemia, unspecified: Secondary | ICD-10-CM | POA: Diagnosis not present

## 2020-12-07 DIAGNOSIS — E1142 Type 2 diabetes mellitus with diabetic polyneuropathy: Secondary | ICD-10-CM | POA: Insufficient documentation

## 2020-12-07 DIAGNOSIS — Z885 Allergy status to narcotic agent status: Secondary | ICD-10-CM | POA: Insufficient documentation

## 2020-12-07 DIAGNOSIS — Z8249 Family history of ischemic heart disease and other diseases of the circulatory system: Secondary | ICD-10-CM | POA: Insufficient documentation

## 2020-12-07 DIAGNOSIS — Z808 Family history of malignant neoplasm of other organs or systems: Secondary | ICD-10-CM | POA: Insufficient documentation

## 2020-12-07 DIAGNOSIS — Z8349 Family history of other endocrine, nutritional and metabolic diseases: Secondary | ICD-10-CM | POA: Insufficient documentation

## 2020-12-07 DIAGNOSIS — M869 Osteomyelitis, unspecified: Secondary | ICD-10-CM | POA: Insufficient documentation

## 2020-12-07 DIAGNOSIS — E11621 Type 2 diabetes mellitus with foot ulcer: Secondary | ICD-10-CM | POA: Diagnosis not present

## 2020-12-07 DIAGNOSIS — G8918 Other acute postprocedural pain: Secondary | ICD-10-CM | POA: Diagnosis not present

## 2020-12-07 DIAGNOSIS — I10 Essential (primary) hypertension: Secondary | ICD-10-CM | POA: Diagnosis not present

## 2020-12-07 HISTORY — DX: Anxiety disorder, unspecified: F41.9

## 2020-12-07 HISTORY — PX: AMPUTATION: SHX166

## 2020-12-07 HISTORY — DX: Depression, unspecified: F32.A

## 2020-12-07 LAB — BASIC METABOLIC PANEL
Anion gap: 12 (ref 5–15)
BUN: 8 mg/dL (ref 6–20)
CO2: 23 mmol/L (ref 22–32)
Calcium: 9.3 mg/dL (ref 8.9–10.3)
Chloride: 100 mmol/L (ref 98–111)
Creatinine, Ser: 0.69 mg/dL (ref 0.61–1.24)
GFR, Estimated: >60 mL/min (ref >60)
Glucose, Bld: 195 mg/dL — ABNORMAL HIGH (ref 70–99)
Potassium: 3.6 mmol/L (ref 3.5–5.1)
Sodium: 135 mmol/L (ref 135–145)

## 2020-12-07 LAB — CBC
HCT: 42.7 % (ref 39.0–52.0)
Hemoglobin: 14.5 g/dL (ref 13.0–17.0)
MCH: 28.7 pg (ref 26.0–34.0)
MCHC: 34 g/dL (ref 30.0–36.0)
MCV: 84.6 fL (ref 80.0–100.0)
Platelets: 254 10*3/uL (ref 150–400)
RBC: 5.05 MIL/uL (ref 4.22–5.81)
RDW: 12.4 % (ref 11.5–15.5)
WBC: 11.8 10*3/uL — ABNORMAL HIGH (ref 4.0–10.5)
nRBC: 0 % (ref 0.0–0.2)

## 2020-12-07 LAB — GLUCOSE, CAPILLARY
Glucose-Capillary: 187 mg/dL — ABNORMAL HIGH (ref 70–99)
Glucose-Capillary: 168 mg/dL — ABNORMAL HIGH (ref 70–99)
Glucose-Capillary: 183 mg/dL — ABNORMAL HIGH (ref 70–99)

## 2020-12-07 SURGERY — AMPUTATION, FOOT, RAY
Anesthesia: General | Site: Foot | Laterality: Right

## 2020-12-07 MED ORDER — CELECOXIB 200 MG PO CAPS
200.0000 mg | ORAL_CAPSULE | Freq: Once | ORAL | Status: AC
  Administered 2020-12-07: 200 mg via ORAL
  Filled 2020-12-07: qty 1

## 2020-12-07 MED ORDER — MIDAZOLAM HCL 2 MG/2ML IJ SOLN
2.0000 mg | Freq: Once | INTRAMUSCULAR | Status: AC
  Administered 2020-12-07: 2 mg via INTRAVENOUS

## 2020-12-07 MED ORDER — ORAL CARE MOUTH RINSE: 15.0000 mL | Freq: Once | OROMUCOSAL | Status: DC

## 2020-12-07 MED ORDER — FENTANYL CITRATE (PF) 100 MCG/2ML IJ SOLN
100.0000 ug | Freq: Once | INTRAMUSCULAR | Status: AC
Start: 2020-12-07 — End: 2020-12-07
  Administered 2020-12-07: 100 ug via INTRAVENOUS

## 2020-12-07 MED ORDER — AMISULPRIDE (ANTIEMETIC) 5 MG/2ML IV SOLN
10.0000 mg | Freq: Once | INTRAVENOUS | Status: AC
  Administered 2020-12-07: 10 mg via INTRAVENOUS

## 2020-12-07 MED ORDER — FENTANYL CITRATE (PF) 250 MCG/5ML IJ SOLN
INTRAMUSCULAR | Status: AC
  Filled 2020-12-07: qty 5

## 2020-12-07 MED ORDER — MIDAZOLAM HCL 2 MG/2ML IJ SOLN
INTRAMUSCULAR | Status: AC
  Filled 2020-12-07: qty 2

## 2020-12-07 MED ORDER — CHLORHEXIDINE GLUCONATE 0.12 % MT SOLN: 15.0000 mL | Freq: Once | OROMUCOSAL | Status: DC

## 2020-12-07 MED ORDER — CEFAZOLIN SODIUM-DEXTROSE 2-4 GM/100ML-% IV SOLN
2.0000 g | INTRAVENOUS | Status: AC
  Administered 2020-12-07: 2 g via INTRAVENOUS
  Filled 2020-12-07: qty 100

## 2020-12-07 MED ORDER — PHENYLEPHRINE HCL (PRESSORS) 10 MG/ML IV SOLN
INTRAVENOUS | Status: DC | PRN
  Administered 2020-12-07: 80 ug via INTRAVENOUS
  Administered 2020-12-07: 120 ug via INTRAVENOUS
  Administered 2020-12-07: 200 ug via INTRAVENOUS

## 2020-12-07 MED ORDER — FENTANYL CITRATE (PF) 100 MCG/2ML IJ SOLN: 25.0000 ug | INTRAMUSCULAR | Status: DC | PRN

## 2020-12-07 MED ORDER — AMISULPRIDE (ANTIEMETIC) 5 MG/2ML IV SOLN
INTRAVENOUS | Status: AC
  Filled 2020-12-07: qty 4

## 2020-12-07 MED ORDER — LACTATED RINGERS IV SOLN: INTRAVENOUS | Status: DC

## 2020-12-07 MED ORDER — BUPIVACAINE HCL (PF) 0.5 % IJ SOLN
INTRAMUSCULAR | Status: DC | PRN
  Administered 2020-12-07: 15 mL

## 2020-12-07 MED ORDER — PROPOFOL 10 MG/ML IV BOLUS
INTRAVENOUS | Status: DC | PRN
  Administered 2020-12-07: 200 mg via INTRAVENOUS

## 2020-12-07 MED ORDER — FENTANYL CITRATE (PF) 100 MCG/2ML IJ SOLN
INTRAMUSCULAR | Status: AC
  Filled 2020-12-07: qty 2

## 2020-12-07 MED ORDER — PROPOFOL 10 MG/ML IV BOLUS
INTRAVENOUS | Status: AC
  Filled 2020-12-07: qty 20

## 2020-12-07 MED ORDER — BUPIVACAINE LIPOSOME 1.3 % IJ SUSP
INTRAMUSCULAR | Status: DC | PRN
  Administered 2020-12-07: 10 mL

## 2020-12-07 MED ORDER — ACETAMINOPHEN 500 MG PO TABS
1000.0000 mg | ORAL_TABLET | Freq: Once | ORAL | Status: AC
  Administered 2020-12-07: 1000 mg via ORAL
  Filled 2020-12-07: qty 2

## 2020-12-07 MED ORDER — 0.9 % SODIUM CHLORIDE (POUR BTL) OPTIME
TOPICAL | Status: DC | PRN
  Administered 2020-12-07: 1000 mL

## 2020-12-07 MED ORDER — CHLORHEXIDINE GLUCONATE 0.12 % MT SOLN
OROMUCOSAL | Status: AC
  Administered 2020-12-07: 15 mL
  Filled 2020-12-07: qty 15

## 2020-12-07 MED ORDER — HYDROCODONE-ACETAMINOPHEN 5-325 MG PO TABS: 1.0000 | ORAL_TABLET | ORAL | 0 refills | Status: DC | PRN

## 2020-12-07 SURGICAL SUPPLY — 32 items
BLADE SAW SGTL MED 73X18.5 STR (BLADE) IMPLANT
BLADE SURG 21 STRL SS (BLADE) ×2 IMPLANT
BNDG COHESIVE 4X5 TAN STRL (GAUZE/BANDAGES/DRESSINGS) ×2 IMPLANT
BNDG GAUZE ELAST 4 BULKY (GAUZE/BANDAGES/DRESSINGS) ×2 IMPLANT
CONT SPEC 4OZ CLIKSEAL STRL BL (MISCELLANEOUS) ×2 IMPLANT
COVER SURGICAL LIGHT HANDLE (MISCELLANEOUS) ×4 IMPLANT
COVER WAND RF STERILE (DRAPES) ×2 IMPLANT
DRAPE DERMATAC (DRAPES) ×2 IMPLANT
DRAPE U-SHAPE 47X51 STRL (DRAPES) ×4 IMPLANT
DRSG ADAPTIC 3X8 NADH LF (GAUZE/BANDAGES/DRESSINGS) ×2 IMPLANT
DRSG PAD ABDOMINAL 8X10 ST (GAUZE/BANDAGES/DRESSINGS) ×4 IMPLANT
DURAPREP 26ML APPLICATOR (WOUND CARE) ×2 IMPLANT
ELECT REM PT RETURN 9FT ADLT (ELECTROSURGICAL) ×2
ELECTRODE REM PT RTRN 9FT ADLT (ELECTROSURGICAL) ×1 IMPLANT
GAUZE SPONGE 4X4 12PLY STRL (GAUZE/BANDAGES/DRESSINGS) ×2 IMPLANT
GLOVE BIOGEL PI IND STRL 9 (GLOVE) ×1 IMPLANT
GLOVE BIOGEL PI INDICATOR 9 (GLOVE) ×1
GLOVE SURG ORTHO 9.0 STRL STRW (GLOVE) ×2 IMPLANT
GOWN STRL REUS W/ TWL XL LVL3 (GOWN DISPOSABLE) ×2 IMPLANT
GOWN STRL REUS W/TWL XL LVL3 (GOWN DISPOSABLE) ×4
KIT BASIN OR (CUSTOM PROCEDURE TRAY) ×2 IMPLANT
KIT DRSG PREVENA PLUS 7DAY 125 (MISCELLANEOUS) ×2 IMPLANT
KIT PREVENA INCISION MGT 13 (CANNISTER) ×2 IMPLANT
KIT TURNOVER KIT B (KITS) ×2 IMPLANT
NS IRRIG 1000ML POUR BTL (IV SOLUTION) ×2 IMPLANT
PACK ORTHO EXTREMITY (CUSTOM PROCEDURE TRAY) ×2 IMPLANT
PAD ARMBOARD 7.5X6 YLW CONV (MISCELLANEOUS) ×4 IMPLANT
STOCKINETTE IMPERVIOUS LG (DRAPES) IMPLANT
SUT ETHILON 2 0 PSLX (SUTURE) ×4 IMPLANT
TOWEL GREEN STERILE (TOWEL DISPOSABLE) ×2 IMPLANT
TUBE CONNECTING 12X1/4 (SUCTIONS) ×2 IMPLANT
YANKAUER SUCT BULB TIP NO VENT (SUCTIONS) ×2 IMPLANT

## 2020-12-07 NOTE — Anesthesia Procedure Notes (Signed)
Anesthesia Regional Block: Popliteal block   Pre-Anesthetic Checklist: ,, timeout performed, Correct Patient, Correct Site, Correct Laterality, Correct Procedure, Correct Position, site marked, Risks and benefits discussed,  Surgical consent,  Pre-op evaluation,  At surgeon's request and post-op pain management  Laterality: Left  Prep: chloraprep       Needles:  Injection technique: Single-shot  Needle Type: Echogenic Stimulator Needle          Additional Needles:   Narrative:  Start time: 12/07/2020 10:34 AM End time: 12/07/2020 10:44 AM Injection made incrementally with aspirations every 5 mL.  Performed by: Personally  Anesthesiologist: Duane Boston, MD  Additional Notes: A functioning IV was confirmed and monitors were applied.  Sterile prep and drape, hand hygiene and sterile gloves were used.  Negative aspiration and test dose prior to incremental administration of local anesthetic. The patient tolerated the procedure well.Ultrasound  guidance: relevant anatomy identified, needle position confirmed, local anesthetic spread visualized around nerve(s), vascular puncture avoided.  Image printed for medical record.

## 2020-12-07 NOTE — H&P (Signed)
Grant [name] is an 50 y.o. male.   Chief Complaint: Right Foot osteomyelitis HPI:  Patient is a 50 year old gentleman who presents in follow-up status post stent revascularization to the right lower extremity with ulcer beneath the fifth metatarsal head.  Patient states he has had increasing pain redness and cellulitis and has been on doxycycline. Past Medical History:  Diagnosis Date  . Anxiety   . Chronic low back pain 11/30/2015  . Depression   . Diabetic peripheral neuropathy ([place]) 11/30/2015  . Hypertension   . Neuropathy   . Type 2 diabetes mellitus ([place])     Past Surgical History:  Procedure Laterality Date  . back lipoma resection  03/2006  . BIOPSY  06/15/2018   Procedure: BIOPSY;  Surgeon: Jerene Bears, MD;  Location: [facility];  Service: Gastroenterology;;  . CARDIAC CATHETERIZATION    . ESOPHAGOGASTRODUODENOSCOPY (EGD) WITH PROPOFOL N/A 06/15/2018   Procedure: ESOPHAGOGASTRODUODENOSCOPY (EGD) WITH PROPOFOL;  Surgeon: Jerene Bears, MD;  Location: [facility];  Service: Gastroenterology;  Laterality: N/A;  . KNEE ARTHROSCOPY Left 10/2008   Meniscus Repair  . KNEE ARTHROSCOPY W/ ACL RECONSTRUCTION Left 1998  . LEFT HEART CATHETERIZATION WITH CORONARY ANGIOGRAM N/A 08/11/2013   Procedure: LEFT HEART CATHETERIZATION WITH CORONARY ANGIOGRAM;  Surgeon: Pixie Casino, MD;  Location: [facility] CATH LAB;  Service: Cardiovascular;  Laterality: N/A;  . LOWER EXTREMITY ANGIOGRAPHY Right 11/10/2020   Procedure: LOWER EXTREMITY ANGIOGRAPHY;  Surgeon: Algernon Huxley, MD;  Location: [facility];  Service: Cardiovascular;  Laterality: Right;  . MENISCUS REPAIR Left 2001  . UPPER GI ENDOSCOPY  10/2007   showed gastritis    Family History  Problem Relation Age of Onset  . Diabetes Mother   . Hypertension Mother   . Hypertension Father   . Hyperlipidemia Father   . Alcohol abuse Brother   . Brain cancer Other        grandparent  . Stomach cancer Other        grandparent  .  Stomach cancer Paternal Grandfather   . Colon cancer Neg Hx   . Esophageal cancer Neg Hx   . Rectal cancer Neg Hx    Social History:  reports that he quit smoking about 4 years ago. He has a 10.00 pack-year smoking history. His smokeless tobacco use includes chew. He reports that he does not drink alcohol and does not use drugs.  Allergies:  Allergies  Allergen Reactions  . Codeine Nausea And Vomiting  . Tylox [Oxycodone-Acetaminophen] Nausea And Vomiting    No medications prior to admission.    Results for orders placed or performed during the hospital encounter of 12/05/20 (from the past 48 hour(s))  SARS CORONAVIRUS 2 (TAT 6-24 HRS) Nasopharyngeal Nasopharyngeal Swab     Status: None   Collection Time: 12/05/20  2:50 PM   Specimen: Nasopharyngeal Swab  Result Value Ref Range   SARS Coronavirus 2 NEGATIVE NEGATIVE    Comment: (NOTE) SARS-CoV-2 target nucleic acids are NOT DETECTED.  The SARS-CoV-2 RNA is generally detectable in upper and lower respiratory specimens during the acute phase of infection. Negative results do not preclude SARS-CoV-2 infection, do not rule out co-infections with other pathogens, and should not be used as the sole basis for treatment or other patient management decisions. Negative results must be combined with clinical observations, patient history, and epidemiological information. The expected result is Negative.  Fact Sheet for Patients: SugarRoll.be  Fact Sheet for Healthcare Providers: https://www.woods-mathews.com/  This test is not  yet approved or cleared by the [place] and  has been authorized for detection and/or diagnosis of SARS-CoV-2 by FDA under an Emergency Use Authorization (EUA). This EUA will remain  in effect (meaning this test can be used) for the duration of the COVID-19 declaration under Se ction 564(b)(1) of the Act, 21 U.S.C. section 360bbb-3(b)(1), unless the  authorization is terminated or revoked sooner.  Performed at [facility], [place] [address] [place], [place] [address]    No results found.  Review of Systems  All other systems reviewed and are negative.   There were no vitals taken for this visit. Physical Exam  Patient is alert, oriented, no adenopathy, well-dressed, normal affect, normal respiratory effort. Examination patient has a palpable dorsalis pedis pulse he has increased cellulitis over the dorsal lateral aspect of the foot this is tender to palpation the ulcer is necrotic and probes to bone.heart RRR Lungs Clear Assessment/Plan 1. Osteomyelitis of fifth toe of right foot ([place])   2. Type 2 diabetes mellitus with peripheral neuropathy (H[facility])   3. Ulcer of right foot, with necrosis of bone ([place])     Plan: Due to the progression of the ulceration and exposed bone I have recommended proceeding with 1/5 ray amputation risk and benefits were discussed including risk of the wound not healing need for additional surgery.  Patient states he understands wished to proceed at this time we will set surgery up for Wednesday.   Bevely Palmer Lai Hendriks, PA 12/07/2020, 6:37 AM

## 2020-12-07 NOTE — Anesthesia Procedure Notes (Addendum)
Procedure Name: LMA Insertion Date/Time: 12/07/2020 12:43 PM Performed by: Hoy Morn, CRNA Pre-anesthesia Checklist: Patient identified, Emergency Drugs available, Suction available, Patient being monitored and Timeout performed Patient Re-evaluated:Patient Re-evaluated prior to induction Oxygen Delivery Method: Circle system utilized Preoxygenation: Pre-oxygenation with 100% oxygen Induction Type: IV induction Ventilation: Mask ventilation without difficulty LMA Size: 5.0 Airway Equipment and Method: Patient positioned with wedge pillow Dental Injury: Teeth and Oropharynx as per pre-operative assessment

## 2020-12-07 NOTE — Anesthesia Preprocedure Evaluation (Addendum)
Anesthesia Evaluation  Patient identified by MRN, date of birth, ID band Patient awake    Reviewed: Allergy & Precautions, NPO status , Patient's Chart, lab work & pertinent test results  History of Anesthesia Complications Negative for: history of anesthetic complications  Airway Mallampati: II  TM Distance: >3 FB Neck ROM: Full    Dental no notable dental hx. (+) Dental Advisory Given   Pulmonary neg pulmonary ROS, former smoker,    Pulmonary exam normal        Cardiovascular hypertension, Pt. on home beta blockers and Pt. on medications Normal cardiovascular exam  Study Conclusions   - Left ventricle: The cavity size was normal. There was moderate  concentric hypertrophy. Systolic function was vigorous. The  estimated ejection fraction was in the range of 65% to 70%. Wall  motion was normal; there were no regional wall motion  abnormalities. The study is not technically sufficient to allow  evaluation of LV diastolic function.  - Aortic valve: There was no regurgitation.  - Aortic root: The aortic root was normal in size.  - Mitral valve: There was trivial regurgitation.  - Right ventricle: The cavity size was normal. Wall thickness was  normal. Systolic function was normal.  - Right atrium: The atrium was normal in size.  - Pulmonary arteries: The main pulmonary artery was normal-sized.  Systolic pressure was within the normal range.  - Inferior vena cava: The vessel was normal in size.  - Pericardium, extracardiac: A trivial pericardial effusion was  identified posterior to the heart. Features were not consistent  with tamponade physiology.    Neuro/Psych Anxiety Depression negative neurological ROS     GI/Hepatic Neg liver ROS,   Endo/Other  diabetes  Renal/GU negative Renal ROS     Musculoskeletal negative musculoskeletal ROS (+)   Abdominal   Peds  Hematology negative hematology  ROS (+)   Anesthesia Other Findings   Reproductive/Obstetrics                            Anesthesia Physical Anesthesia Plan  ASA: III  Anesthesia Plan: General   Post-op Pain Management:    Induction: Intravenous  PONV Risk Score and Plan: 2 and Ondansetron and Midazolam  Airway Management Planned: LMA  Additional Equipment:   Intra-op Plan:   Post-operative Plan: Extubation in OR  Informed Consent: I have reviewed the patients History and Physical, chart, labs and discussed the procedure including the risks, benefits and alternatives for the proposed anesthesia with the patient or authorized representative who has indicated his/her understanding and acceptance.     Dental advisory given  Plan Discussed with: Anesthesiologist and CRNA  Anesthesia Plan Comments:        Anesthesia Quick Evaluation

## 2020-12-07 NOTE — Op Note (Signed)
12/07/2020  1:35 PM  PATIENT:  Grant [name]    PRE-OPERATIVE DIAGNOSIS:  Right Foot Osteomyelitis  POST-OPERATIVE DIAGNOSIS:  Same  PROCEDURE:  RIGHT FOOT 5TH RAY AMPUTATION Local tissue rearrangement for wound closure 3 x 9 cm. Application of 13 cm Prevena wound VAC Tissue cultures obtained for aerobic and anaerobic.  SURGEON:  [name], MD  PHYSICIAN ASSISTANT:None ANESTHESIA:   General  PREOPERATIVE INDICATIONS:  Grant [name] is a  50 y.o. male with a diagnosis of Right Foot Osteomyelitis who failed conservative measures and elected for surgical management.    The risks benefits and alternatives were discussed with the patient preoperatively including but not limited to the risks of infection, bleeding, nerve injury, cardiopulmonary complications, the need for revision surgery, among others, and the patient was willing to proceed.  OPERATIVE IMPLANTS: none  @ENCIMAGES @  OPERATIVE FINDINGS: Initial tissue margins were not clear additional  soft tissue was resected requiring local tissue rearrangement for wound closure 3 x 9 cm.  OPERATIVE PROCEDURE: Patient was brought the operating room after undergoing a regional block.  The right lower extremity was prepped using DuraPrep draped into a sterile field a timeout was called.  Elliptical incision was made around the ulcerative tissue and the fifth ray.  The fifth ray was resected through the base with a oscillating saw.  There appeared to be some infection in the soft tissue margins and additional soft tissue was resected leaving a wound 3 x 9 cm.  The wound was irrigated with normal saline electrocautery was used hemostasis the tissue was sent for cultures.  Local tissue rearrangement was used to close the wound 3 x 9 cm.  A 13 cm Prevena wound VAC was applied this was covered with derma tack overwrapped with Covan this had a good suction fit patient was taken the PACU in stable condition   DISCHARGE PLANNING:  Antibiotic  duration: Patient will continue his doxycycline we will just depending on the cultures  Weightbearing: Strict nonweightbearing on the right  Pain medication: Prescription for Percocet  Dressing care/ Wound VAC: Continue wound VAC for 1 week  Ambulatory devices: Crutches  Discharge to: Home.  Follow-up: In the office 1 week post operative.

## 2020-12-07 NOTE — Anesthesia Postprocedure Evaluation (Signed)
Anesthesia Post Note  Patient: Grant [name]  Procedure(s) Performed: RIGHT FOOT 5TH RAY AMPUTATION (Right Foot)     Patient location during evaluation: PACU Anesthesia Type: General Level of consciousness: sedated Pain management: pain level controlled Vital Signs Assessment: post-procedure vital signs reviewed and stable Respiratory status: spontaneous breathing and respiratory function stable Cardiovascular status: stable Postop Assessment: no apparent nausea or vomiting Anesthetic complications: yes   Encounter Complications  Complication Outcome Phase Comment  Nausea  In Recovery     Last Vitals:  Vitals:   12/07/20 1400 12/07/20 1410  BP: (!) 152/98 (!) 145/90  Pulse: 87 89  Resp: 14 11  Temp:  (!) 36.3 C  SpO2: 99% 99%    Last Pain:  Vitals:   12/07/20 1410  TempSrc:   PainSc: 0-No pain                 Buffy Ehler DANIEL

## 2020-12-07 NOTE — Interval H&P Note (Signed)
History and Physical Interval Note:  12/07/2020 12:23 PM  Grant [name]  has presented today for surgery, with the diagnosis of Right Foot Osteomyelitis.  The various methods of treatment have been discussed with the patient and family. After consideration of risks, benefits and other options for treatment, the patient has consented to  Procedure(s): RIGHT FOOT 5TH RAY AMPUTATION (Right) as a surgical intervention.  The patient's history has been reviewed, patient examined, no change in status, stable for surgery.  I have reviewed the patient's chart and labs.  Questions were answered to the patient's satisfaction.     [name]

## 2020-12-07 NOTE — Transfer of Care (Signed)
Immediate Anesthesia Transfer of Care Note  Patient: Grant [name]  Procedure(s) Performed: RIGHT FOOT 5TH RAY AMPUTATION (Right Foot)  Patient Location: PACU  Anesthesia Type:General  Level of Consciousness: drowsy  Airway & Oxygen Therapy: Patient Spontanous Breathing and Patient connected to face mask oxygen  Post-op Assessment: Report given to RN  Post vital signs: Reviewed and stable  Last Vitals:  Vitals Value Taken Time  BP 100/71 12/07/20 1322  Temp 36.1 C 12/07/20 1320  Pulse 94 12/07/20 1336  Resp 12 12/07/20 1336  SpO2 100 % 12/07/20 1336  Vitals shown include unvalidated device data.  Last Pain:  Vitals:   12/07/20 0955  TempSrc: Oral         Complications: No complications documented.

## 2020-12-08 ENCOUNTER — Ambulatory Visit: Payer: Medicare HMO | Admitting: Orthopedic Surgery

## 2020-12-08 ENCOUNTER — Encounter (HOSPITAL_COMMUNITY): Payer: Self-pay | Admitting: Orthopedic Surgery

## 2020-12-08 ENCOUNTER — Telehealth: Payer: Self-pay | Admitting: Orthopedic Surgery

## 2020-12-08 NOTE — Telephone Encounter (Signed)
Pt called and wants to know if you have an suggestions on if he loses power and he has a would vac how cna he keep it charged. Please give him a call [phone]

## 2020-12-08 NOTE — Telephone Encounter (Signed)
I called pt and advised that he should keep a good charge on the device and if he should lose power he should se if there is a friend or family member that still has power that he can charge the vac. To call on Mar 30, 2023 if the battery has dies and we can make an appt for him to come in.

## 2020-12-12 LAB — AEROBIC/ANAEROBIC CULTURE W GRAM STAIN (SURGICAL/DEEP WOUND)
Culture: NO GROWTH
Gram Stain: NONE SEEN

## 2020-12-14 ENCOUNTER — Encounter: Payer: Self-pay | Admitting: Physician Assistant

## 2020-12-14 ENCOUNTER — Ambulatory Visit (INDEPENDENT_AMBULATORY_CARE_PROVIDER_SITE_OTHER): Payer: Medicare HMO | Admitting: Physician Assistant

## 2020-12-14 DIAGNOSIS — M869 Osteomyelitis, unspecified: Secondary | ICD-10-CM

## 2020-12-14 NOTE — Progress Notes (Signed)
Office Visit Note   Patient: Grant [name]           Date of Birth: 06-Feb-1971           MRN: [id number] Visit Date: 12/14/2020              Requested by: [name], MD [place],  [place] [id number] PCP: [name], MD  No chief complaint on file.     HPI: This is a pleasant gentleman who is 1 week status post right fifth ray amputation.  No complaints he has his wound VAC in place  Assessment & Plan: Visit Diagnoses: No diagnosis found.  Plan: Patient will begin daily cleansing with Dial soap and water.  Emphasized the importance of elevating his foot remaining nonweightbearing and adequate protein intake.  We will follow-up in 1 week  Follow-Up Instructions: No follow-ups on file.   Ortho Exam  Patient is alert, oriented, no adenopathy, well-dressed, normal affect, normal respiratory effort. Right foot surgical staples are in place.  Some skin maceration.  Some swelling at the base of the incision but no erythema no foul odor very slight dehiscence at the distal end.  There is some bloody drainage  Imaging: No results found. No images are attached to the encounter.  Labs: Lab Results  Component Value Date   HGBA1C 9.4 (H) 06/09/2018   ESRSEDRATE 2 11/30/2015   REPTSTATUS 12/12/2020 FINAL 12/07/2020   GRAMSTAIN NO WBC SEEN NO ORGANISMS SEEN  12/07/2020   CULT  12/07/2020    No growth aerobically or anaerobically. Performed at [facility], [place] [address] [place], [place] [address]      Lab Results  Component Value Date   ALBUMIN 4.5 08/01/2018   ALBUMIN 3.7 07/28/2018   ALBUMIN 4.2 07/27/2018    Lab Results  Component Value Date   MG 2.0 07/30/2018   MG 2.0 07/28/2018   MG 1.8 07/27/2018   No results found for: VD25OH  No results found for: PREALBUMIN CBC EXTENDED Latest Ref Rng & Units 12/07/2020 08/01/2018 07/30/2018  WBC 4.0 - 10.5 K/uL 11.8(H) 12.6(H) 8.1  RBC 4.22 - 5.81 MIL/uL 5.05 5.23 4.44  HGB 13.0 - 17.0  g/dL 14.5 15.9 13.2  HCT 39.0 - 52.0 % 42.7 43.8 38.4(L)  PLT 150 - 400 K/uL 254 222 175  NEUTROABS 1.7 - 7.7 K/uL - 10.0(H) -  LYMPHSABS 0.7 - 4.0 K/uL - 1.5 -     There is no height or weight on file to calculate BMI.  Orders:  No orders of the defined types were placed in this encounter.  No orders of the defined types were placed in this encounter.    Procedures: No procedures performed  Clinical Data: No additional findings.  ROS:  All other systems negative, except as noted in the HPI. Review of Systems  Objective: Vital Signs: There were no vitals taken for this visit.  Specialty Comments:  No specialty comments available.  PMFS History: Patient Active Problem List   Diagnosis Date Noted  . Osteomyelitis of fifth toe of right foot ([place])   . Alcohol use disorder, severe, dependence ([place]) 04/16/2019  . 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[place]) 04/16/2019  . Cannabis hyperemesis syndrome concurrent with and due to cannabis abuse ([place]) 08/01/2018  . Gastroesophageal reflux disease 07/17/2018  . Long QT interval 07/16/2018  . Early satiety   . Esophagitis, [place] grade D   . Hypercalcemia 06/09/2018  .  SIRS (systemic inflammatory response syndrome) ([place]) 06/09/2018  . Hematemesis 06/09/2018  . Type 2 diabetes mellitus with peripheral neuropathy ([place]) 06/09/2018  . Closed displaced fracture of first metatarsal bone of left foot 07/31/2017  . Acidosis 09/17/2016  . Dehydration 09/17/2016  . Abdominal pain 09/17/2016  . DKA (diabetic ketoacidoses) 09/17/2016  . Nausea and vomiting 09/17/2016  . IDDM (insulin dependent diabetes mellitus)   . Gastroparesis   . Diabetic peripheral neuropathy ([place]) 11/30/2015  . Chronic low back pain 11/30/2015  . Coronary artery spasm ([place]) 08/17/2013  . Hyperlipidemia 08/17/2013  . Peripheral neuropathy 08/05/2013  . Chest pain 08/05/2013  . Fatigue 08/05/2013  . Tachycardia  08/05/2013  . DOE (dyspnea on exertion) 08/05/2013  . Diabetes mellitus, insulin dependent (IDDM), uncontrolled 02/09/2008  . Anxiety state 02/09/2008  . Essential hypertension 02/09/2008  . ALCOHOL ABUSE, HX OF 02/09/2008  . LIVER FUNCTION TESTS, ABNORMAL, HX OF 02/09/2008  . NEOPLASM, BENIGN, ESOPHAGUS 09/16/2007  . Gastritis 09/16/2007   Past Medical History:  Diagnosis Date  . Anxiety   . Chronic low back pain 11/30/2015  . Depression   . Diabetic peripheral neuropathy ([place]) 11/30/2015  . Hypertension   . Neuropathy   . Type 2 diabetes mellitus ([facility])     Family History  Problem Relation Age of Onset  . Diabetes Mother   . Hypertension Mother   . Hypertension Father   . Hyperlipidemia Father   . Alcohol abuse Brother   . Brain cancer Other        grandparent  . Stomach cancer Other        grandparent  . Stomach cancer Paternal Grandfather   . Colon cancer Neg Hx   . Esophageal cancer Neg Hx   . Rectal cancer Neg Hx     Past Surgical History:  Procedure Laterality Date  . AMPUTATION Right 12/07/2020   Procedure: RIGHT FOOT 5TH RAY AMPUTATION;  Surgeon: [name], MD;  Location: [facility];  Service: Orthopedics;  Laterality: Right;  . back lipoma resection  03/2006  . BIOPSY  06/15/2018   Procedure: BIOPSY;  Surgeon: Jerene Bears, MD;  Location: [facility];  Service: Gastroenterology;;  . CARDIAC CATHETERIZATION    . ESOPHAGOGASTRODUODENOSCOPY (EGD) WITH PROPOFOL N/A 06/15/2018   Procedure: ESOPHAGOGASTRODUODENOSCOPY (EGD) WITH PROPOFOL;  Surgeon: Jerene Bears, MD;  Location: [facility];  Service: Gastroenterology;  Laterality: N/A;  . KNEE ARTHROSCOPY Left 10/2008   Meniscus Repair  . KNEE ARTHROSCOPY W/ ACL RECONSTRUCTION Left 1998  . LEFT HEART CATHETERIZATION WITH CORONARY ANGIOGRAM N/A 08/11/2013   Procedure: LEFT HEART CATHETERIZATION WITH CORONARY ANGIOGRAM;  Surgeon: Pixie Casino, MD;  Location: [facility] CATH LAB;  Service: Cardiovascular;  Laterality: N/A;  .  LOWER EXTREMITY ANGIOGRAPHY Right 11/10/2020   Procedure: LOWER EXTREMITY ANGIOGRAPHY;  Surgeon: Algernon Huxley, MD;  Location: [facility];  Service: Cardiovascular;  Laterality: Right;  . MENISCUS REPAIR Left 2001  . UPPER GI ENDOSCOPY  10/2007   showed gastritis   Social History   Occupational History  . Occupation: disability  Tobacco Use  . Smoking status: Former Smoker    Packs/day: 0.50    Years: 20.00    Pack years: 10.00    Quit date: 03/23/2016    Years since quitting: 4.7  . Smokeless tobacco: Current User    Types: Chew  . Tobacco comment:  chews daily  Vaping Use  . Vaping Use: Never used  Substance and Sexual Activity  . Alcohol use:  No    Comment: Recovered alcoholic  . Drug use: No  . Sexual activity: Not on file

## 2020-12-20 ENCOUNTER — Other Ambulatory Visit (INDEPENDENT_AMBULATORY_CARE_PROVIDER_SITE_OTHER): Payer: Self-pay | Admitting: Vascular Surgery

## 2020-12-20 ENCOUNTER — Encounter: Payer: Self-pay | Admitting: Orthopedic Surgery

## 2020-12-20 ENCOUNTER — Ambulatory Visit (INDEPENDENT_AMBULATORY_CARE_PROVIDER_SITE_OTHER): Payer: Medicare HMO | Admitting: Physician Assistant

## 2020-12-20 DIAGNOSIS — M869 Osteomyelitis, unspecified: Secondary | ICD-10-CM

## 2020-12-20 DIAGNOSIS — I70239 Atherosclerosis of native arteries of right leg with ulceration of unspecified site: Secondary | ICD-10-CM

## 2020-12-20 DIAGNOSIS — Z95820 Peripheral vascular angioplasty status with implants and grafts: Secondary | ICD-10-CM

## 2020-12-20 MED ORDER — NITROGLYCERIN 0.2 MG/HR TD PT24: 0.2000 mg | MEDICATED_PATCH | Freq: Every day | TRANSDERMAL | 12 refills | Status: DC

## 2020-12-20 NOTE — Progress Notes (Signed)
Office Visit Note   Patient: Grant [name]           Date of Birth: 26-Sep-1971           MRN: [id number] Visit Date: 12/20/2020              Requested by: [name], [place],  [place] [address] PCP: [name], MD  Chief Complaint  Patient presents with  . Right Foot - Routine Post Op    12/08/19 right foot 5th ray amputation       HPI: Patient is not quite 2 weeks status post right foot fifth ray amputation.  He has been doing daily dry dressing changes admits he is putting some weight on it denies any significant pain  Assessment & Plan: Visit Diagnoses: No diagnosis found.  Plan: We will place him on a nitroglycerin patch.  He cannot go on any blood thinners now because he is having some dental work but would consider Trental.  Follow-up in 1 week.  Follow-Up Instructions: No follow-ups on file.   Ortho Exam  Patient is alert, oriented, no adenopathy, well-dressed, normal affect, normal respiratory effort. Examination of his incision well apposed wound edges he does have some necrotic eschar at the distal end but no surrounding cellulitis or erythema.  Mild wound dehiscence in the middle portion of the wound.  Sutures are intact  Imaging: No results found. No images are attached to the encounter.  Labs: Lab Results  Component Value Date   HGBA1C 9.4 (H) 06/09/2018   ESRSEDRATE 2 11/30/2015   REPTSTATUS 12/12/2020 FINAL 12/07/2020   GRAMSTAIN NO WBC SEEN NO ORGANISMS SEEN  12/07/2020   CULT  12/07/2020    No growth aerobically or anaerobically. Performed at [facility], [place] [address], [place], [place] [address]      Lab Results  Component Value Date   ALBUMIN 4.5 08/01/2018   ALBUMIN 3.7 07/28/2018   ALBUMIN 4.2 07/27/2018    Lab Results  Component Value Date   MG 2.0 07/30/2018   MG 2.0 07/28/2018   MG 1.8 07/27/2018   No results found for: VD25OH  No results found for: PREALBUMIN CBC EXTENDED Latest Ref  Rng & Units 12/07/2020 08/01/2018 07/30/2018  WBC 4.0 - 10.5 K/uL 11.8(H) 12.6(H) 8.1  RBC 4.22 - 5.81 MIL/uL 5.05 5.23 4.44  HGB 13.0 - 17.0 g/dL 14.5 15.9 13.2  HCT 39.0 - 52.0 % 42.7 43.8 38.4(L)  PLT 150 - 400 K/uL 254 222 175  NEUTROABS 1.7 - 7.7 K/uL - 10.0(H) -  LYMPHSABS 0.7 - 4.0 K/uL - 1.5 -     There is no height or weight on file to calculate BMI.  Orders:  No orders of the defined types were placed in this encounter.  No orders of the defined types were placed in this encounter.    Procedures: No procedures performed  Clinical Data: No additional findings.  ROS:  All other systems negative, except as noted in the HPI. Review of Systems  Objective: Vital Signs: There were no vitals taken for this visit.  Specialty Comments:  No specialty comments available.  PMFS History: Patient Active Problem List   Diagnosis Date Noted  . Osteomyelitis of fifth toe of right foot ([place])   . Alcohol use disorder, severe, dependence ([place]) 04/16/2019  . 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[place]) 04/16/2019  . Cannabis hyperemesis syndrome concurrent with and due to cannabis abuse ([place]) 08/01/2018  .  Gastroesophageal reflux disease 07/17/2018  . Long QT interval 07/16/2018  . Early satiety   . Esophagitis, [place] grade D   . Hypercalcemia 06/09/2018  . SIRS (systemic inflammatory response syndrome) ([place]) 06/09/2018  . Hematemesis 06/09/2018  . Type 2 diabetes mellitus with peripheral neuropathy ([place]) 06/09/2018  . Closed displaced fracture of first metatarsal bone of left foot 07/31/2017  . Acidosis 09/17/2016  . Dehydration 09/17/2016  . Abdominal pain 09/17/2016  . DKA (diabetic ketoacidoses) 09/17/2016  . Nausea and vomiting 09/17/2016  . IDDM (insulin dependent diabetes mellitus)   . Gastroparesis   . Diabetic peripheral neuropathy ([place]) 11/30/2015  . Chronic low back pain 11/30/2015  . Coronary artery spasm ([place]) 08/17/2013   . Hyperlipidemia 08/17/2013  . Peripheral neuropathy 08/05/2013  . Chest pain 08/05/2013  . Fatigue 08/05/2013  . Tachycardia 08/05/2013  . DOE (dyspnea on exertion) 08/05/2013  . Diabetes mellitus, insulin dependent (IDDM), uncontrolled 02/09/2008  . Anxiety state 02/09/2008  . Essential hypertension 02/09/2008  . ALCOHOL ABUSE, HX OF 02/09/2008  . LIVER FUNCTION TESTS, ABNORMAL, HX OF 02/09/2008  . NEOPLASM, BENIGN, ESOPHAGUS 09/16/2007  . Gastritis 09/16/2007   Past Medical History:  Diagnosis Date  . Anxiety   . Chronic low back pain 11/30/2015  . Depression   . Diabetic peripheral neuropathy ([place]) 11/30/2015  . Hypertension   . Neuropathy   . Type 2 diabetes mellitus (HCC)     Family History  Problem Relation Age of Onset  . Diabetes Mother   . Hypertension Mother   . Hypertension Father   . Hyperlipidemia Father   . Alcohol abuse Brother   . Brain cancer Other        grandparent  . Stomach cancer Other        grandparent  . Stomach cancer Paternal Grandfather   . Colon cancer Neg Hx   . Esophageal cancer Neg Hx   . Rectal cancer Neg Hx     Past Surgical History:  Procedure Laterality Date  . AMPUTATION Right 12/07/2020   Procedure: RIGHT FOOT 5TH RAY AMPUTATION;  Surgeon: [name], MD;  Location: [facility];  Service: Orthopedics;  Laterality: Right;  . back lipoma resection  03/2006  . BIOPSY  06/15/2018   Procedure: BIOPSY;  Surgeon: Jerene Bears, MD;  Location: [facility];  Service: Gastroenterology;;  . CARDIAC CATHETERIZATION    . ESOPHAGOGASTRODUODENOSCOPY (EGD) WITH PROPOFOL N/A 06/15/2018   Procedure: ESOPHAGOGASTRODUODENOSCOPY (EGD) WITH PROPOFOL;  Surgeon: Jerene Bears, MD;  Location: [facility];  Service: Gastroenterology;  Laterality: N/A;  . KNEE ARTHROSCOPY Left 10/2008   Meniscus Repair  . KNEE ARTHROSCOPY W/ ACL RECONSTRUCTION Left 1998  . LEFT HEART CATHETERIZATION WITH CORONARY ANGIOGRAM N/A 08/11/2013   Procedure: LEFT HEART  CATHETERIZATION WITH CORONARY ANGIOGRAM;  Surgeon: Pixie Casino, MD;  Location: [facility] CATH LAB;  Service: Cardiovascular;  Laterality: N/A;  . LOWER EXTREMITY ANGIOGRAPHY Right 11/10/2020   Procedure: LOWER EXTREMITY ANGIOGRAPHY;  Surgeon: Algernon Huxley, MD;  Location: [facility] CV LAB;  Service: Cardiovascular;  Laterality: Right;  . MENISCUS REPAIR Left 2001  . UPPER GI ENDOSCOPY  10/2007   showed gastritis   Social History   Occupational History  . Occupation: disability  Tobacco Use  . Smoking status: Former Smoker    Packs/day: 0.50    Years: 20.00    Pack years: 10.00    Quit date: 03/23/2016    Years since quitting: 4.7  . Smokeless tobacco: Current User  Types: Chew  . Tobacco comment:  chews daily  Vaping Use  . Vaping Use: Never used  Substance and Sexual Activity  . Alcohol use: No    Comment: Recovered alcoholic  . Drug use: No  . Sexual activity: Not on file

## 2020-12-21 DIAGNOSIS — K006 Disturbances in tooth eruption: Secondary | ICD-10-CM | POA: Diagnosis not present

## 2020-12-22 ENCOUNTER — Encounter (INDEPENDENT_AMBULATORY_CARE_PROVIDER_SITE_OTHER): Payer: Medicare HMO

## 2020-12-22 ENCOUNTER — Ambulatory Visit (INDEPENDENT_AMBULATORY_CARE_PROVIDER_SITE_OTHER): Payer: Medicare HMO | Admitting: Nurse Practitioner

## 2020-12-29 ENCOUNTER — Ambulatory Visit (INDEPENDENT_AMBULATORY_CARE_PROVIDER_SITE_OTHER): Payer: Medicare HMO | Admitting: Orthopedic Surgery

## 2020-12-29 DIAGNOSIS — Z89421 Acquired absence of other right toe(s): Secondary | ICD-10-CM

## 2020-12-29 DIAGNOSIS — M869 Osteomyelitis, unspecified: Secondary | ICD-10-CM

## 2020-12-30 ENCOUNTER — Telehealth: Payer: Self-pay | Admitting: Orthopedic Surgery

## 2020-12-30 ENCOUNTER — Other Ambulatory Visit: Payer: Self-pay | Admitting: Physician Assistant

## 2020-12-30 MED ORDER — PENTOXIFYLLINE ER 400 MG PO TBCR: 400.0000 mg | EXTENDED_RELEASE_TABLET | Freq: Three times a day (TID) | ORAL | 3 refills | Status: DC

## 2020-12-30 NOTE — Telephone Encounter (Signed)
Please see message below and advise. Dictation is pending

## 2020-12-30 NOTE — Telephone Encounter (Signed)
I called pt to advise that this has been sent to pharm. Will call with any other questions.

## 2020-12-30 NOTE — Telephone Encounter (Signed)
Patient called. Says Dr. Sharol Given was going to call in a medication for him but he does not know what the medication is nor what it was for. He would like a call back and the medication sent to the pharmacy. His call back number is [phone]

## 2020-12-30 NOTE — Telephone Encounter (Signed)
Trental  to improve circulation called in to pharmacy

## 2021-01-02 ENCOUNTER — Encounter: Payer: Self-pay | Admitting: Physician Assistant

## 2021-01-02 NOTE — Progress Notes (Signed)
Office Visit Note   Patient: Grant [name]           Date of Birth: Oct 30, 1971           MRN: [id number] Visit Date: 12/29/2020              Requested by: [name], [place],  [place] [address] PCP: [name], MD  Chief Complaint  Patient presents with  . Right Foot - Routine Post Op    12/07/20 right foot 5th ray amputation       HPI: Patient is a 50 year old gentleman who presents status post right foot fifth ray amputation he is started a nitroglycerin patch we will add Trental today.  Assessment & Plan: Visit Diagnoses:  1. Osteomyelitis of fifth toe of right foot ([place])   2. History of complete ray amputation of fifth toe of right foot ([place])     Plan: Will add Trental with the nitroglycerin patch recommended the knee-high compression sock his calf is 38 cm in circumference.  Follow-Up Instructions: Return in about 3 weeks (around 01/19/2021).   Ortho Exam  Patient is alert, oriented, no adenopathy, well-dressed, normal affect, normal respiratory effort. Examination patient has a Doppler biphasic dorsalis pedis and posterior tibial pulse he is status post a stent revascularization to the right lower extremity there is some slight wound dehiscence there is no cellulitis.  Imaging: No results found. No images are attached to the encounter.  Labs: Lab Results  Component Value Date   HGBA1C 9.4 (H) 06/09/2018   ESRSEDRATE 2 11/30/2015   REPTSTATUS 12/12/2020 FINAL 12/07/2020   GRAMSTAIN NO WBC SEEN NO ORGANISMS SEEN  12/07/2020   CULT  12/07/2020    No growth aerobically or anaerobically. Performed at [facility], [place] [address], [place], [place] [address]      Lab Results  Component Value Date   ALBUMIN 4.5 08/01/2018   ALBUMIN 3.7 07/28/2018   ALBUMIN 4.2 07/27/2018    Lab Results  Component Value Date   MG 2.0 07/30/2018   MG 2.0 07/28/2018   MG 1.8 07/27/2018   No results found for: VD25OH  No results  found for: PREALBUMIN CBC EXTENDED Latest Ref Rng & Units 12/07/2020 08/01/2018 07/30/2018  WBC 4.0 - 10.5 K/uL 11.8(H) 12.6(H) 8.1  RBC 4.22 - 5.81 MIL/uL 5.05 5.23 4.44  HGB 13.0 - 17.0 g/dL 14.5 15.9 13.2  HCT 39.0 - 52.0 % 42.7 43.8 38.4(L)  PLT 150 - 400 K/uL 254 222 175  NEUTROABS 1.7 - 7.7 K/uL - 10.0(H) -  LYMPHSABS 0.7 - 4.0 K/uL - 1.5 -     There is no height or weight on file to calculate BMI.  Orders:  No orders of the defined types were placed in this encounter.  No orders of the defined types were placed in this encounter.    Procedures: No procedures performed  Clinical Data: No additional findings.  ROS:  All other systems negative, except as noted in the HPI. Review of Systems  Objective: Vital Signs: There were no vitals taken for this visit.  Specialty Comments:  No specialty comments available.  PMFS History: Patient Active Problem List   Diagnosis Date Noted  . Osteomyelitis of fifth toe of right foot ([place])   . Alcohol use disorder, severe, dependence ([place]) 04/16/2019  . 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[place]) 04/16/2019  . Cannabis hyperemesis syndrome concurrent with and due to cannabis abuse ([place]) 08/01/2018  .  Gastroesophageal reflux disease 07/17/2018  . Long QT interval 07/16/2018  . Early satiety   . Esophagitis, [place] grade D   . Hypercalcemia 06/09/2018  . SIRS (systemic inflammatory response syndrome) ([place]) 06/09/2018  . Hematemesis 06/09/2018  . Type 2 diabetes mellitus with peripheral neuropathy ([place]) 06/09/2018  . Closed displaced fracture of first metatarsal bone of left foot 07/31/2017  . Acidosis 09/17/2016  . Dehydration 09/17/2016  . Abdominal pain 09/17/2016  . DKA (diabetic ketoacidoses) 09/17/2016  . Nausea and vomiting 09/17/2016  . IDDM (insulin dependent diabetes mellitus)   . Gastroparesis   . Diabetic peripheral neuropathy ([place]) 11/30/2015  . Chronic low back pain  11/30/2015  . Coronary artery spasm ([place]) 08/17/2013  . Hyperlipidemia 08/17/2013  . Peripheral neuropathy 08/05/2013  . Chest pain 08/05/2013  . Fatigue 08/05/2013  . Tachycardia 08/05/2013  . DOE (dyspnea on exertion) 08/05/2013  . Diabetes mellitus, insulin dependent (IDDM), uncontrolled 02/09/2008  . Anxiety state 02/09/2008  . Essential hypertension 02/09/2008  . ALCOHOL ABUSE, HX OF 02/09/2008  . LIVER FUNCTION TESTS, ABNORMAL, HX OF 02/09/2008  . NEOPLASM, BENIGN, ESOPHAGUS 09/16/2007  . Gastritis 09/16/2007   Past Medical History:  Diagnosis Date  . Anxiety   . Chronic low back pain 11/30/2015  . Depression   . Diabetic peripheral neuropathy ([place]) 11/30/2015  . Hypertension   . Neuropathy   . Type 2 diabetes mellitus (HCC)     Family History  Problem Relation Age of Onset  . Diabetes Mother   . Hypertension Mother   . Hypertension Father   . Hyperlipidemia Father   . Alcohol abuse Brother   . Brain cancer Other        grandparent  . Stomach cancer Other        grandparent  . Stomach cancer Paternal Grandfather   . Colon cancer Neg Hx   . Esophageal cancer Neg Hx   . Rectal cancer Neg Hx     Past Surgical History:  Procedure Laterality Date  . AMPUTATION Right 12/07/2020   Procedure: RIGHT FOOT 5TH RAY AMPUTATION;  Surgeon: [name], MD;  Location: [facility];  Service: Orthopedics;  Laterality: Right;  . back lipoma resection  03/2006  . BIOPSY  06/15/2018   Procedure: BIOPSY;  Surgeon: Jerene Bears, MD;  Location: [facility];  Service: Gastroenterology;;  . CARDIAC CATHETERIZATION    . ESOPHAGOGASTRODUODENOSCOPY (EGD) WITH PROPOFOL N/A 06/15/2018   Procedure: ESOPHAGOGASTRODUODENOSCOPY (EGD) WITH PROPOFOL;  Surgeon: Jerene Bears, MD;  Location: [facility];  Service: Gastroenterology;  Laterality: N/A;  . KNEE ARTHROSCOPY Left 10/2008   Meniscus Repair  . KNEE ARTHROSCOPY W/ ACL RECONSTRUCTION Left 1998  . LEFT HEART CATHETERIZATION WITH CORONARY  ANGIOGRAM N/A 08/11/2013   Procedure: LEFT HEART CATHETERIZATION WITH CORONARY ANGIOGRAM;  Surgeon: Pixie Casino, MD;  [facility] C[facility] LAB;  Service: Cardiovascular;  Laterality: N/A;  . LOWER EXTREMITY ANGIOGRAPHY Right 11/10/2020   Procedure: LOWER EXTREMITY ANGIOGRAPHY;  Surgeon: Algernon Huxley, MD;  Location: [facility] LAB;  Service: Cardiovascular;  Laterality: Right;  . MENISCUS REPAIR Left 2001  . UPPER GI ENDOSCOPY  10/2007   showed gastritis   Social History   Occupational History  . Occupation: disability  Tobacco Use  . Smoking status: Former Smoker    Packs/day: 0.50    Years: 20.00    Pack years: 10.00    Quit date: 03/23/2016    Years since quitting: 4.7  . Smokeless tobacco: Current User  Types: Chew  . Tobacco comment:  chews daily  Vaping Use  . Vaping Use: Never used  Substance and Sexual Activity  . Alcohol use: No    Comment: Recovered alcoholic  . Drug use: No  . Sexual activity: Not on file

## 2021-01-05 ENCOUNTER — Ambulatory Visit (INDEPENDENT_AMBULATORY_CARE_PROVIDER_SITE_OTHER): Payer: Medicare HMO | Admitting: Physician Assistant

## 2021-01-05 ENCOUNTER — Encounter: Payer: Self-pay | Admitting: Orthopedic Surgery

## 2021-01-05 DIAGNOSIS — M869 Osteomyelitis, unspecified: Secondary | ICD-10-CM

## 2021-01-05 MED ORDER — HYDROCODONE-ACETAMINOPHEN 5-325 MG PO TABS: 1.0000 | ORAL_TABLET | ORAL | 0 refills | Status: AC | PRN

## 2021-01-05 NOTE — Progress Notes (Signed)
Office Visit Note   Patient: Grant [name]           Date of Birth: 09-Oct-1971           MRN: [id number] Visit Date: 01/05/2021              Requested by: [name], [place],  [place] [address] PCP: [name], MD  Chief Complaint  Patient presents with  . Right Foot - Routine Post Op    12/07/20 right foot 5th ray amputation       HPI: Patient is 1 month status post right foot fifth ray amputation.  He has been using Trental and a nitro patch as well as a compression sock.  Assessment & Plan: Visit Diagnoses: No diagnosis found.  Plan: Continue with daily cleansing and using his sock.  Follow-up in 2 weeks or sooner if any concerns  Follow-Up Instructions: No follow-ups on file.   Ortho Exam  Patient is alert, oriented, no adenopathy, well-dressed, normal affect, normal respiratory effort. Examination swelling is well controlled no cellulitis.  He does have some distal wound dehiscence but this is filling in with eschar and fibrous tissue.  Sutures are in place but are loose and no longer holding tissue.  These were removed without difficulty.  No foul odor no purulent drainage some of the eschar was debrided to healthy pink skin  Imaging: No results found. No images are attached to the encounter.  Labs: Lab Results  Component Value Date   HGBA1C 9.4 (H) 06/09/2018   ESRSEDRATE 2 11/30/2015   REPTSTATUS 12/12/2020 FINAL 12/07/2020   GRAMSTAIN NO WBC SEEN NO ORGANISMS SEEN  12/07/2020   CULT  12/07/2020    No growth aerobically or anaerobically. Performed at [facility], [place] [address], [place], [place] [address]      Lab Results  Component Value Date   ALBUMIN 4.5 08/01/2018   ALBUMIN 3.7 07/28/2018   ALBUMIN 4.2 07/27/2018    Lab Results  Component Value Date   MG 2.0 07/30/2018   MG 2.0 07/28/2018   MG 1.8 07/27/2018   No results found for: VD25OH  No results found for: PREALBUMIN CBC EXTENDED Latest Ref  Rng & Units 12/07/2020 08/01/2018 07/30/2018  WBC 4.0 - 10.5 K/uL 11.8(H) 12.6(H) 8.1  RBC 4.22 - 5.81 MIL/uL 5.05 5.23 4.44  HGB 13.0 - 17.0 g/dL 14.5 15.9 13.2  HCT 39.0 - 52.0 % 42.7 43.8 38.4(L)  PLT 150 - 400 K/uL 254 222 175  NEUTROABS 1.7 - 7.7 K/uL - 10.0(H) -  LYMPHSABS 0.7 - 4.0 K/uL - 1.5 -     There is no height or weight on file to calculate BMI.  Orders:  No orders of the defined types were placed in this encounter.  Meds ordered this encounter  Medications  . HYDROcodone-acetaminophen (NORCO/VICODIN) 5-325 MG tablet    Sig: Take 1 tablet by mouth every 4 (four) hours as needed for moderate pain.    Dispense:  30 tablet    Refill:  0     Procedures: No procedures performed  Clinical Data: No additional findings.  ROS:  All other systems negative, except as noted in the HPI. Review of Systems  Objective: Vital Signs: There were no vitals taken for this visit.  Specialty Comments:  No specialty comments available.  PMFS History: Patient Active Problem List   Diagnosis Date Noted  . Osteomyelitis of fifth toe of right foot ([place])   .  Alcohol use disorder, severe, dependence ([place]) 04/16/2019  . 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[place]) 04/16/2019  . Cannabis hyperemesis syndrome concurrent with and due to cannabis abuse ([place]) 08/01/2018  . Gastroesophageal reflux disease 07/17/2018  . Long QT interval 07/16/2018  . Early satiety   . Esophagitis, [place] grade D   . Hypercalcemia 06/09/2018  . SIRS (systemic inflammatory response syndrome) ([place]) 06/09/2018  . Hematemesis 06/09/2018  . Type 2 diabetes mellitus with peripheral neuropathy ([place]) 06/09/2018  . Closed displaced fracture of first metatarsal bone of left foot 07/31/2017  . Acidosis 09/17/2016  . Dehydration 09/17/2016  . Abdominal pain 09/17/2016  . DKA (diabetic ketoacidoses) 09/17/2016  . Nausea and vomiting 09/17/2016  . IDDM (insulin dependent  diabetes mellitus)   . Gastroparesis   . Diabetic peripheral neuropathy ([place]) 11/30/2015  . Chronic low back pain 11/30/2015  . Coronary artery spasm ([place]) 08/17/2013  . Hyperlipidemia 08/17/2013  . Peripheral neuropathy 08/05/2013  . Chest pain 08/05/2013  . Fatigue 08/05/2013  . Tachycardia 08/05/2013  . DOE (dyspnea on exertion) 08/05/2013  . Diabetes mellitus, insulin dependent (IDDM), uncontrolled 02/09/2008  . Anxiety state 02/09/2008  . Essential hypertension 02/09/2008  . ALCOHOL ABUSE, HX OF 02/09/2008  . LIVER FUNCTION TESTS, ABNORMAL, HX OF 02/09/2008  . NEOPLASM, BENIGN, ESOPHAGUS 09/16/2007  . Gastritis 09/16/2007   Past Medical History:  Diagnosis Date  . Anxiety   . Chronic low back pain 11/30/2015  . Depression   . Diabetic peripheral neuropathy ([place]) 11/30/2015  . Hypertension   . Neuropathy   . Type 2 diabetes mellitus (HCC)     Family History  Problem Relation Age of Onset  . Diabetes Mother   . Hypertension Mother   . Hypertension Father   . Hyperlipidemia Father   . Alcohol abuse Brother   . Brain cancer Other        grandparent  . Stomach cancer Other        grandparent  . Stomach cancer Paternal Grandfather   . Colon cancer Neg Hx   . Esophageal cancer Neg Hx   . Rectal cancer Neg Hx     Past Surgical History:  Procedure Laterality Date  . AMPUTATION Right 12/07/2020   Procedure: RIGHT FOOT 5TH RAY AMPUTATION;  Surgeon: [name], MD;  Location: [facility];  Service: Orthopedics;  Laterality: Right;  . back lipoma resection  03/2006  . BIOPSY  06/15/2018   Procedure: BIOPSY;  Surgeon: Jerene Bears, MD;  Location: [facility];  Service: Gastroenterology;;  . CARDIAC CATHETERIZATION    . ESOPHAGOGASTRODUODENOSCOPY (EGD) WITH PROPOFOL N/A 06/15/2018   Procedure: ESOPHAGOGASTRODUODENOSCOPY (EGD) WITH PROPOFOL;  Surgeon: Jerene Bears, MD;  Location: [facility];  Service: Gastroenterology;  Laterality: N/A;  . KNEE ARTHROSCOPY Left 10/2008    Meniscus Repair  . KNEE ARTHROSCOPY W/ ACL RECONSTRUCTION Left 1998  . LEFT HEART CATHETERIZATION WITH CORONARY ANGIOGRAM N/A 08/11/2013   Procedure: LEFT HEART CATHETERIZATION WITH CORONARY ANGIOGRAM;  Surgeon: Pixie Casino, MD;  Location[facility] LAB;  Service: Cardiovascular;  Laterality: N/A;  . LOWER EXTREMITY ANGIOGRAPHY Right 11/10/2020   Procedure: LOWER EXTREMITY ANGIOGRAPHY;  Surgeon: Algernon Huxley, MD;  Location: [facility] CV LAB;  Service: Cardiovascular;  Laterality: Right;  . MENISCUS REPAIR Left 2001  . UPPER GI ENDOSCOPY  10/2007   showed gastritis   Social History   Occupational History  . Occupation: disability  Tobacco Use  . Smoking status: Former Smoker  Packs/day: 0.50    Years: 20.00    Pack years: 10.00    Quit date: 03/23/2016    Years since quitting: 4.7  . Smokeless tobacco: Current User    Types: Chew  . Tobacco comment:  chews daily  Vaping Use  . Vaping Use: Never used  Substance and Sexual Activity  . Alcohol use: No    Comment: Recovered alcoholic  . Drug use: No  . Sexual activity: Not on file

## 2021-01-09 DIAGNOSIS — E113213 Type 2 diabetes mellitus with mild nonproliferative diabetic retinopathy with macular edema, bilateral: Secondary | ICD-10-CM | POA: Diagnosis not present

## 2021-01-09 DIAGNOSIS — H47012 Ischemic optic neuropathy, left eye: Secondary | ICD-10-CM | POA: Diagnosis not present

## 2021-01-09 DIAGNOSIS — H43823 Vitreomacular adhesion, bilateral: Secondary | ICD-10-CM | POA: Diagnosis not present

## 2021-01-09 DIAGNOSIS — H2513 Age-related nuclear cataract, bilateral: Secondary | ICD-10-CM | POA: Diagnosis not present

## 2021-01-10 DIAGNOSIS — H47012 Ischemic optic neuropathy, left eye: Secondary | ICD-10-CM | POA: Diagnosis not present

## 2021-01-19 ENCOUNTER — Ambulatory Visit (INDEPENDENT_AMBULATORY_CARE_PROVIDER_SITE_OTHER): Payer: Medicare HMO | Admitting: Orthopedic Surgery

## 2021-01-19 DIAGNOSIS — Z89421 Acquired absence of other right toe(s): Secondary | ICD-10-CM

## 2021-01-20 ENCOUNTER — Encounter: Payer: Self-pay | Admitting: Orthopedic Surgery

## 2021-01-20 DIAGNOSIS — N521 Erectile dysfunction due to diseases classified elsewhere: Secondary | ICD-10-CM | POA: Diagnosis not present

## 2021-01-20 DIAGNOSIS — L97519 Non-pressure chronic ulcer of other part of right foot with unspecified severity: Secondary | ICD-10-CM | POA: Diagnosis not present

## 2021-01-20 DIAGNOSIS — I739 Peripheral vascular disease, unspecified: Secondary | ICD-10-CM | POA: Diagnosis not present

## 2021-01-20 DIAGNOSIS — E785 Hyperlipidemia, unspecified: Secondary | ICD-10-CM | POA: Diagnosis not present

## 2021-01-20 DIAGNOSIS — I2510 Atherosclerotic heart disease of native coronary artery without angina pectoris: Secondary | ICD-10-CM | POA: Diagnosis not present

## 2021-01-20 DIAGNOSIS — G4700 Insomnia, unspecified: Secondary | ICD-10-CM | POA: Diagnosis not present

## 2021-01-20 DIAGNOSIS — E1140 Type 2 diabetes mellitus with diabetic neuropathy, unspecified: Secondary | ICD-10-CM | POA: Diagnosis not present

## 2021-01-20 DIAGNOSIS — G629 Polyneuropathy, unspecified: Secondary | ICD-10-CM | POA: Diagnosis not present

## 2021-01-20 DIAGNOSIS — I10 Essential (primary) hypertension: Secondary | ICD-10-CM | POA: Diagnosis not present

## 2021-01-20 NOTE — Progress Notes (Signed)
Office Visit Note   Patient: Grant [name]           Date of Birth: July 26, 1971           MRN: [id number] Visit Date: 01/19/2021              Requested by: [name], [place],  [place] [id number] PCP: [name], MD  Chief Complaint  Patient presents with  . Right Foot - Routine Post Op    12/07/20 right foot 5th ray amputation       HPI: Patient is a 50 year old gentleman who is 6 weeks status post right foot fifth ray amputation.  He is currently ambulating with crutches postoperative shoe and compression stocking using a nitroglycerin patch and Trental.  Assessment & Plan: Visit Diagnoses:  1. History of complete ray amputation of fifth toe of right foot ([place])     Plan: Continue with current treatment minimize weightbearing.  Follow-Up Instructions: Return in about 3 weeks (around 02/09/2021).   Ortho Exam  Patient is alert, oriented, no adenopathy, well-dressed, normal affect, normal respiratory effort. Examination patient has a palpable dorsalis pedis pulse he has wound dehiscence that is 50 x 5 mm with 75% healthy granulation tissue.  Imaging: No results found. No images are attached to the encounter.  Labs: Lab Results  Component Value Date   HGBA1C 9.4 (H) 06/09/2018   ESRSEDRATE 2 11/30/2015   REPTSTATUS 12/12/2020 FINAL 12/07/2020   GRAMSTAIN NO WBC SEEN NO ORGANISMS SEEN  12/07/2020   CULT  12/07/2020    No growth aerobically or anaerobically. Performed at [facility], [place] [address], [place], [place] [address]      Lab Results  Component Value Date   ALBUMIN 4.5 08/01/2018   ALBUMIN 3.7 07/28/2018   ALBUMIN 4.2 07/27/2018    Lab Results  Component Value Date   MG 2.0 07/30/2018   MG 2.0 07/28/2018   MG 1.8 07/27/2018   No results found for: VD25OH  No results found for: PREALBUMIN CBC EXTENDED Latest Ref Rng & Units 12/07/2020 08/01/2018 07/30/2018  WBC 4.0 - 10.5 K/uL 11.8(H) 12.6(H) 8.1  RBC 4.22 -  5.81 MIL/uL 5.05 5.23 4.44  HGB 13.0 - 17.0 g/dL 14.5 15.9 13.2  HCT 39.0 - 52.0 % 42.7 43.8 38.4(L)  PLT 150 - 400 K/uL 254 222 175  NEUTROABS 1.7 - 7.7 K/uL - 10.0(H) -  LYMPHSABS 0.7 - 4.0 K/uL - 1.5 -     There is no height or weight on file to calculate BMI.  Orders:  No orders of the defined types were placed in this encounter.  No orders of the defined types were placed in this encounter.    Procedures: No procedures performed  Clinical Data: No additional findings.  ROS:  All other systems negative, except as noted in the HPI. Review of Systems  Objective: Vital Signs: There were no vitals taken for this visit.  Specialty Comments:  No specialty comments available.  PMFS History: Patient Active Problem List   Diagnosis Date Noted  . Osteomyelitis of fifth toe of right foot ([place])   . Alcohol use disorder, severe, dependence ([place]) 04/16/2019  . 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[place]) 04/16/2019  . Cannabis hyperemesis syndrome concurrent with and due to cannabis abuse ([place]) 08/01/2018  . Gastroesophageal reflux disease 07/17/2018  . Long QT interval 07/16/2018  . Early satiety   . Esophagitis, [place] grade D   .  Hypercalcemia 06/09/2018  . SIRS (systemic inflammatory response syndrome) ([place]) 06/09/2018  . Hematemesis 06/09/2018  . Type 2 diabetes mellitus with peripheral neuropathy ([place]) 06/09/2018  . Closed displaced fracture of first metatarsal bone of left foot 07/31/2017  . Acidosis 09/17/2016  . Dehydration 09/17/2016  . Abdominal pain 09/17/2016  . DKA (diabetic ketoacidoses) 09/17/2016  . Nausea and vomiting 09/17/2016  . IDDM (insulin dependent diabetes mellitus)   . Gastroparesis   . Diabetic peripheral neuropathy ([place]) 11/30/2015  . Chronic low back pain 11/30/2015  . Coronary artery spasm ([place]) 08/17/2013  . Hyperlipidemia 08/17/2013  . Peripheral neuropathy 08/05/2013  . Chest pain 08/05/2013   . Fatigue 08/05/2013  . Tachycardia 08/05/2013  . DOE (dyspnea on exertion) 08/05/2013  . Diabetes mellitus, insulin dependent (IDDM), uncontrolled 02/09/2008  . Anxiety state 02/09/2008  . Essential hypertension 02/09/2008  . ALCOHOL ABUSE, HX OF 02/09/2008  . LIVER FUNCTION TESTS, ABNORMAL, HX OF 02/09/2008  . NEOPLASM, BENIGN, ESOPHAGUS 09/16/2007  . Gastritis 09/16/2007   Past Medical History:  Diagnosis Date  . Anxiety   . Chronic low back pain 11/30/2015  . Depression   . Diabetic peripheral neuropathy ([place]) 11/30/2015  . Hypertension   . Neuropathy   . Type 2 diabetes mellitus (HCC)     Family History  Problem Relation Age of Onset  . Diabetes Mother   . Hypertension Mother   . Hypertension Father   . Hyperlipidemia Father   . Alcohol abuse Brother   . Brain cancer Other        grandparent  . Stomach cancer Other        grandparent  . Stomach cancer Paternal Grandfather   . Colon cancer Neg Hx   . Esophageal cancer Neg Hx   . Rectal cancer Neg Hx     Past Surgical History:  Procedure Laterality Date  . AMPUTATION Right 12/07/2020   Procedure: RIGHT FOOT 5TH RAY AMPUTATION;  Surgeon: [name], MD;  Location: [facility];  Service: Orthopedics;  Laterality: Right;  . back lipoma resection  03/2006  . BIOPSY  06/15/2018   Procedure: BIOPSY;  Surgeon: Jerene Bears, MD;  Location: [facility];  Service: Gastroenterology;;  . CARDIAC CATHETERIZATION    . ESOPHAGOGASTRODUODENOSCOPY (EGD) WITH PROPOFOL N/A 06/15/2018   Procedure: ESOPHAGOGASTRODUODENOSCOPY (EGD) WITH PROPOFOL;  Surgeon: Jerene Bears, MD;  Location: [facility];  Service: Gastroenterology;  Laterality: N/A;  . KNEE ARTHROSCOPY Left 10/2008   Meniscus Repair  . KNEE ARTHROSCOPY W/ ACL RECONSTRUCTION Left 1998  . LEFT HEART CATHETERIZATION WITH CORONARY ANGIOGRAM N/A 08/11/2013   Procedure: LEFT HEART CATHETERIZATION WITH CORONARY ANGIOGRAM;  Surgeon: Pixie Casino, MD;  [facility] LAB;  Service:  Cardiovascular;  Laterality: N/A;  . LOWER EXTREMITY ANGIOGRAPHY Right 11/10/2020   Procedure: LOWER EXTREMITY ANGIOGRAPHY;  Surgeon: Algernon Huxley, MD;  Location: [facility] CV LAB;  Service: Cardiovascular;  Laterality: Right;  . MENISCUS REPAIR Left 2001  . UPPER GI ENDOSCOPY  10/2007   showed gastritis   Social History   Occupational History  . Occupation: disability  Tobacco Use  . Smoking status: Former Smoker    Packs/day: 0.50    Years: 20.00    Pack years: 10.00    Quit date: 03/23/2016    Years since quitting: 4.8  . Smokeless tobacco: Current User    Types: Chew  . Tobacco comment:  chews daily  Vaping Use  . Vaping Use: Never used  Substance and Sexual Activity  .  Alcohol use: No    Comment: Recovered alcoholic  . Drug use: No  . Sexual activity: Not on file

## 2021-01-23 DIAGNOSIS — H47012 Ischemic optic neuropathy, left eye: Secondary | ICD-10-CM | POA: Diagnosis not present

## 2021-01-23 DIAGNOSIS — H43823 Vitreomacular adhesion, bilateral: Secondary | ICD-10-CM | POA: Diagnosis not present

## 2021-01-23 DIAGNOSIS — H35033 Hypertensive retinopathy, bilateral: Secondary | ICD-10-CM | POA: Diagnosis not present

## 2021-01-23 DIAGNOSIS — E113213 Type 2 diabetes mellitus with mild nonproliferative diabetic retinopathy with macular edema, bilateral: Secondary | ICD-10-CM | POA: Diagnosis not present

## 2021-01-26 DIAGNOSIS — H47012 Ischemic optic neuropathy, left eye: Secondary | ICD-10-CM | POA: Diagnosis not present

## 2021-01-27 ENCOUNTER — Telehealth: Payer: Self-pay

## 2021-01-27 ENCOUNTER — Telehealth: Payer: Self-pay | Admitting: Physical Medicine and Rehabilitation

## 2021-01-27 NOTE — Telephone Encounter (Signed)
OV to discuss

## 2021-01-27 NOTE — Telephone Encounter (Signed)
Received referral through [facility] from Dr. Philip Aspen.  peripheral neuropathy pain- consideration for implantable nerve stimulator to control pain- [facility] was [facility] but he is requesting Dr Ernestina Patches  diagnosis: polyneuropathy, unspecified  Please advise.

## 2021-01-27 NOTE — Telephone Encounter (Signed)
Patient called he is returning your phone call [phone]

## 2021-01-27 NOTE — Telephone Encounter (Signed)
Left message #1

## 2021-01-30 ENCOUNTER — Telehealth: Payer: Self-pay | Admitting: Physical Medicine and Rehabilitation

## 2021-01-30 NOTE — Telephone Encounter (Signed)
Patient returned call to make an appointment with Dr Ernestina Patches. The number to contact patient is [phone]

## 2021-01-30 NOTE — Telephone Encounter (Signed)
Scheduled

## 2021-01-30 NOTE — Telephone Encounter (Signed)
See previous message

## 2021-01-30 NOTE — Telephone Encounter (Signed)
Left message #2

## 2021-01-31 DIAGNOSIS — E1140 Type 2 diabetes mellitus with diabetic neuropathy, unspecified: Secondary | ICD-10-CM | POA: Diagnosis not present

## 2021-01-31 DIAGNOSIS — I10 Essential (primary) hypertension: Secondary | ICD-10-CM | POA: Diagnosis not present

## 2021-01-31 DIAGNOSIS — Z794 Long term (current) use of insulin: Secondary | ICD-10-CM | POA: Diagnosis not present

## 2021-02-01 DIAGNOSIS — I10 Essential (primary) hypertension: Secondary | ICD-10-CM | POA: Diagnosis not present

## 2021-02-01 DIAGNOSIS — E1140 Type 2 diabetes mellitus with diabetic neuropathy, unspecified: Secondary | ICD-10-CM | POA: Diagnosis not present

## 2021-02-06 DIAGNOSIS — I10 Essential (primary) hypertension: Secondary | ICD-10-CM | POA: Diagnosis not present

## 2021-02-09 ENCOUNTER — Other Ambulatory Visit: Payer: Self-pay

## 2021-02-09 ENCOUNTER — Encounter: Payer: Self-pay | Admitting: Orthopedic Surgery

## 2021-02-09 ENCOUNTER — Ambulatory Visit (INDEPENDENT_AMBULATORY_CARE_PROVIDER_SITE_OTHER): Payer: Medicare HMO | Admitting: Orthopedic Surgery

## 2021-02-09 DIAGNOSIS — Z89421 Acquired absence of other right toe(s): Secondary | ICD-10-CM

## 2021-02-09 NOTE — Progress Notes (Signed)
Office Visit Note   Patient: Grant [name]           Date of Birth: [birth year]           MRN: [id number] Visit Date: 02/09/2021              Requested by: [name], [place],  [place] [id number] PCP: [name], MD  Chief Complaint  Patient presents with  . Right Foot - Routine Post Op    12/07/20 right foot 5th ray amputation       HPI: Patient is a 50 year old gentleman status post right foot fifth ray amputation he has had difficulty healing he has been using a nitroglycerin patch Trental and a compression stocking he feels like he is making great progress.  Assessment & Plan: Visit Diagnoses:  1. History of complete ray amputation of fifth toe of right foot ([place])     Plan: Patient will continue with his current management follow-up in 3 weeks at which time anticipate we can stop the crutches.  Follow-Up Instructions: Return in about 3 weeks (around 03/02/2021).   Ortho Exam  Patient is alert, oriented, no adenopathy, well-dressed, normal affect, normal respiratory effort. Examination the incision is well-healed there is no redness no cellulitis he has good epithelialization.  There is a very small scab.  Imaging: No results found. No images are attached to the encounter.  Labs: Lab Results  Component Value Date   HGBA1C 9.4 (H) 06/09/2018   ESRSEDRATE 2 11/30/2015   REPTSTATUS 12/12/2020 FINAL 12/07/2020   GRAMSTAIN NO WBC SEEN NO ORGANISMS SEEN  12/07/2020   CULT  12/07/2020    No growth aerobically or anaerobically. Performed at [facility], [place] [address], [place], [place] [address]      Lab Results  Component Value Date   ALBUMIN 4.5 08/01/2018   ALBUMIN 3.7 07/28/2018   ALBUMIN 4.2 07/27/2018    Lab Results  Component Value Date   MG 2.0 07/30/2018   MG 2.0 07/28/2018   MG 1.8 07/27/2018   No results found for: VD25OH  No results found for: PREALBUMIN CBC EXTENDED Latest Ref Rng & Units 12/07/2020  08/01/2018 07/30/2018  WBC 4.0 - 10.5 K/uL 11.8(H) 12.6(H) 8.1  RBC 4.22 - 5.81 MIL/uL 5.05 5.23 4.44  HGB 13.0 - 17.0 g/dL 14.5 15.9 13.2  HCT 39.0 - 52.0 % 42.7 43.8 38.4(L)  PLT 150 - 400 K/uL 254 222 175  NEUTROABS 1.7 - 7.7 K/uL - 10.0(H) -  LYMPHSABS 0.7 - 4.0 K/uL - 1.5 -     There is no height or weight on file to calculate BMI.  Orders:  No orders of the defined types were placed in this encounter.  No orders of the defined types were placed in this encounter.    Procedures: No procedures performed  Clinical Data: No additional findings.  ROS:  All other systems negative, except as noted in the HPI. Review of Systems  Objective: Vital Signs: There were no vitals taken for this visit.  Specialty Comments:  No specialty comments available.  PMFS History: Patient Active Problem List   Diagnosis Date Noted  . Osteomyelitis of fifth toe of right foot ([employer])   . Alcohol use disorder, severe, dependence ([place]) 04/16/2019  . 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[place]) 04/16/2019  . Cannabis hyperemesis syndrome concurrent with and due to cannabis abuse ([place]) 08/01/2018  . Gastroesophageal reflux disease 07/17/2018  . Long QT  interval 07/16/2018  . Early satiety   . Esophagitis, [place] grade D   . Hypercalcemia 06/09/2018  . SIRS (systemic inflammatory response syndrome) ([place]) 06/09/2018  . Hematemesis 06/09/2018  . Type 2 diabetes mellitus with peripheral neuropathy ([place]) 06/09/2018  . Closed displaced fracture of first metatarsal bone of left foot 07/31/2017  . Acidosis 09/17/2016  . Dehydration 09/17/2016  . Abdominal pain 09/17/2016  . DKA (diabetic ketoacidoses) 09/17/2016  . Nausea and vomiting 09/17/2016  . IDDM (insulin dependent diabetes mellitus)   . Gastroparesis   . Diabetic peripheral neuropathy ([place]) 11/30/2015  . Chronic low back pain 11/30/2015  . Coronary artery spasm ([place]) 08/17/2013  . Hyperlipidemia  08/17/2013  . Peripheral neuropathy 08/05/2013  . Chest pain 08/05/2013  . Fatigue 08/05/2013  . Tachycardia 08/05/2013  . DOE (dyspnea on exertion) 08/05/2013  . Diabetes mellitus, insulin dependent (IDDM), uncontrolled 02/09/2008  . Anxiety state 02/09/2008  . Essential hypertension 02/09/2008  . ALCOHOL ABUSE, HX OF 02/09/2008  . LIVER FUNCTION TESTS, ABNORMAL, HX OF 02/09/2008  . NEOPLASM, BENIGN, ESOPHAGUS 09/16/2007  . Gastritis 09/16/2007   Past Medical History:  Diagnosis Date  . Anxiety   . Chronic low back pain 11/30/2015  . Depression   . Diabetic peripheral neuropathy ([place]) 11/30/2015  . Hypertension   . Neuropathy   . Type 2 diabetes mellitus (HCC)     Family History  Problem Relation Age of Onset  . Diabetes Mother   . Hypertension Mother   . Hypertension Father   . Hyperlipidemia Father   . Alcohol abuse Brother   . Brain cancer Other        grandparent  . Stomach cancer Other        grandparent  . Stomach cancer Paternal Grandfather   . Colon cancer Neg Hx   . Esophageal cancer Neg Hx   . Rectal cancer Neg Hx     Past Surgical History:  Procedure Laterality Date  . AMPUTATION Right 12/07/2020   Procedure: RIGHT FOOT 5TH RAY AMPUTATION;  Surgeon: [name], MD;  Location: [facility];  Service: Orthopedics;  Laterality: Right;  . back lipoma resection  03/2006  . BIOPSY  06/15/2018   Procedure: BIOPSY;  Surgeon: Jerene Bears, MD;  Location: [facility];  Service: Gastroenterology;;  . CARDIAC CATHETERIZATION    . ESOPHAGOGASTRODUODENOSCOPY (EGD) WITH PROPOFOL N/A 06/15/2018   Procedure: ESOPHAGOGASTRODUODENOSCOPY (EGD) WITH PROPOFOL;  Surgeon: Jerene Bears, MD;  Location: [facility];  Service: Gastroenterology;  Laterality: N/A;  . KNEE ARTHROSCOPY Left 10/2008   Meniscus Repair  . KNEE ARTHROSCOPY W/ ACL RECONSTRUCTION Left 1998  . LEFT HEART CATHETERIZATION WITH CORONARY ANGIOGRAM N/A 08/11/2013   Procedure: LEFT HEART CATHETERIZATION WITH CORONARY  ANGIOGRAM;  Surgeon: Pixie Casino, MD;  Location[facility] LAB;  Service: Cardiovascular;  Laterality: N/A;  . LOWER EXTREMITY ANGIOGRAPHY Right 11/10/2020   Procedure: LOWER EXTREMITY ANGIOGRAPHY;  Surgeon: Algernon Huxley, MD;  Location: [facility] CV LAB;  Service: Cardiovascular;  Laterality: Right;  . MENISCUS REPAIR Left 2001  . UPPER GI ENDOSCOPY  10/2007   showed gastritis   Social History   Occupational History  . Occupation: disability  Tobacco Use  . Smoking status: Former Smoker    Packs/day: 0.50    Years: 20.00    Pack years: 10.00    Quit date: 03/23/2016    Years since quitting: 4.8  . Smokeless tobacco: Current User    Types: Chew  . Tobacco comment:  chews daily  Vaping Use  . Vaping Use: Never used  Substance and Sexual Activity  . Alcohol use: No    Comment: Recovered alcoholic  . Drug use: No  . Sexual activity: Not on file

## 2021-02-15 ENCOUNTER — Encounter: Payer: Self-pay | Admitting: Physical Medicine and Rehabilitation

## 2021-02-15 ENCOUNTER — Other Ambulatory Visit: Payer: Self-pay

## 2021-02-15 ENCOUNTER — Ambulatory Visit: Payer: Medicare HMO | Admitting: Physical Medicine and Rehabilitation

## 2021-02-15 VITALS — BP 126/86 | HR 87

## 2021-02-15 DIAGNOSIS — M5442 Lumbago with sciatica, left side: Secondary | ICD-10-CM | POA: Diagnosis not present

## 2021-02-15 DIAGNOSIS — M5416 Radiculopathy, lumbar region: Secondary | ICD-10-CM | POA: Diagnosis not present

## 2021-02-15 DIAGNOSIS — G8929 Other chronic pain: Secondary | ICD-10-CM | POA: Diagnosis not present

## 2021-02-15 DIAGNOSIS — G894 Chronic pain syndrome: Secondary | ICD-10-CM | POA: Diagnosis not present

## 2021-02-15 DIAGNOSIS — E1142 Type 2 diabetes mellitus with diabetic polyneuropathy: Secondary | ICD-10-CM

## 2021-02-15 DIAGNOSIS — M5441 Lumbago with sciatica, right side: Secondary | ICD-10-CM | POA: Diagnosis not present

## 2021-02-15 NOTE — Progress Notes (Signed)
Neuropathy which started with numbness in feet. Now has pain in back radiating down legs with burning and stabbing pain in feet. Starting to have pain and numbness in hands. Pain in back is worse with walking. Difficulty sleeping. Plavix Numeric Pain Rating Scale and Functional Assessment Average Pain 7 Pain Right Now 6 My pain is constant, sharp, burning and tingling Pain is worse with: walking and some activites Pain improves with: heat/ice, medication and Lyrica   In the last MONTH (on 0-10 scale) has pain interfered with the following?  1. General activity like being  able to carry out your everyday physical activities such as walking, climbing stairs, carrying groceries, or moving a chair?  Rating(8)  2. Relation with others like being able to carry out your usual social activities and roles such as  activities at home, at work and in your community. Rating(8)  3. Enjoyment of life such that you have  been bothered by emotional problems such as feeling anxious, depressed or irritable?  Rating(8)

## 2021-02-16 ENCOUNTER — Encounter: Payer: Self-pay | Admitting: Physical Medicine and Rehabilitation

## 2021-02-16 NOTE — Progress Notes (Signed)
Grant [name] - 50 y.o. male MRN [id number]  Date of birth: 10/18/1971  Office Visit Note: Visit Date: 02/15/2021 PCP: [name], MD Referred by: [name], MD  Subjective: Chief Complaint  Patient presents with  . Lower Back - Pain  . Right Leg - Pain  . Left Leg - Pain   HPI: Grant [name] is a 50 y.o. male who comes in today At the request of Janie Morning, MD his primary care physician for evaluation management and consideration of spinal cord stimulator.  Patient reports to me that he did not really understand he was coming to talk about possibility of spinal cord stimulators but is very interested in some form of pain management.  His history is very complicated with a history of insulin-dependent diabetes with multiple complications including peripheral polyneuropathy and what he refers to as the starting of retinopathy also with cardiovascular changes.  He recently underwent fifth ray amputation by Dr. Sharol Given in our office.  This was on the right.  He endorses a stocking distribution of pain into the legs with burning and stabbing of the feet and also getting some numbness in the hands.  He rates his pain as a 7 out of 10.  This is a constant sharp burning and tingling pain.  He also however reports pain from the lower spine shooting down the legs worse with walking and activity.  He does feel weak at times but has not noted any foot drop or focal weakness or any changes in bowel or bladder symptoms or any new trauma.  He reports an injury to his spine many years ago and an injury to his back.  He is very focused on the fact that it was an injury.  Lumbar spine MRI from 2011 was obtained by Dr. Wynelle Link so he was obviously seen at [facility] which was [facility] at the time.  This MRI was essentially normal for his age.  Current medications for pain include Lyrica 100 mg twice daily with occasional ibuprofen.  He has been on some hydrocodone for the foot  surgery.  He has allergies and intolerances listed to codeine and Tylox.  Complications for medication approach include history of alcohol abuse and cannabis abuse.  This is per the patient's chart we did not discuss that today.  He does report that his current pain really limits his activities of daily living.  He has not had recent spine imaging.  He reports no recent trauma.  Further complications unfortunately include recent stenting in December and current use of Plavix anticoagulation from that stent.  Review of Systems  Musculoskeletal: Positive for back pain and joint pain.  Neurological: Positive for tingling and weakness.  All other systems reviewed and are negative.  Otherwise per HPI.  Assessment & Plan: Visit Diagnoses:    ICD-10-CM   1. Diabetic peripheral neuropathy (HCC)  E11.42   2. Lumbar radiculopathy  M54.16   3. Chronic bilateral low back pain with bilateral sciatica  M54.42    M54.41    G89.29   4. Chronic pain syndrome  G89.4      Plan: Findings:  1.  Chronic recalcitrant bilateral leg pain with numbness tingling and paresthesia which could be a mixture of polyneuropathy and potential for lumbar radiculitis radiculopathy.  Prior lumbar MRI in 2011 essentially normal at the time.  Currently using Lyrica without much relief.  I had a long discussion with the patient regarding spinal cord stimulators and how they are  done and how they work.  They are FDA approved for polyneuropathy.  When someone has neuropathy and some back issues it can be beneficial for both issues.  At this time he is really not interested in having a device such as this.  He is more interested in medication management including possibility of opioid management.  I did give him some information today on the stimulators to have him look this up to see if it something down the road he would like to look at.  I think he would be a decent candidate for it.  We probably could not do the trial right now anyway  given his current use of Plavix for a stent and they likely would not let us take him off the Plavix but that could be looked at.  In terms of medication pain management I have asked him to talk to Dr. Sharlett Iles.  Appropriate referrals would be [place] [facility] at [facility] pain or the [facility] for pain and rehabilitation.  Depending on where he lives he could also look at the pain group at [facility].  2.  Chronic worsening bilateral low back pain with some referral shooting down the legs could be spine related.  I offered to update MRI of the lumbar spine to get an idea if anything like that was going on that could be amenable to injection type therapy or other intervention therapy.  He was interested in pursuing that at some point but does not want to have MRI done to the current cost of the test.    Meds & Orders: No orders of the defined types were placed in this encounter.  No orders of the defined types were placed in this encounter.   Follow-up: No follow-ups on file.   Procedures: No procedures performed      Clinical History: MRI LUMBAR SPINE WITHOUT CONTRAST    Technique: Multiplanar and multiecho pulse sequences of the lumbar  spine were obtained without intravenous contrast.    Comparison: None.    Findings: Normal lumbar alignment. Negative for fracture or mass.  The conus medullaris is normal.    L1-2: Negative    L2-3: Normal disc. Mild facet degeneration without stenosis.    L3-4: Normal disc space. Mild facet degeneration without  stenosis.    L4-5: Mild disc space narrowing. Small central disc protrusion  without significant spinal stenosis. Mild facet degeneration  bilaterally. Neural foramina are patent bilaterally.    L5-S1: Tiny central disc protrusion without neural impingement or  stenosis.    IMPRESSION:  Mild lumbar degenerative changes as above. Small central disc  protrusions at L4-5 and L5-S1 without neural  impingement.   He reports that he quit smoking about 4 years ago. He has a 10.00 pack-year smoking history. His smokeless tobacco use includes chew. No results for input(s): HGBA1C, LABURIC in the last 8760 hours.  Objective:  VS:  HT:    WT:   BMI:     BP:126/86  HR:87bpm  TEMP: ( )  RESP:  Physical Exam Vitals and nursing note reviewed.  Constitutional:      General: He is not in acute distress.    Appearance: Normal appearance. He is obese. He is not ill-appearing.  HENT:     Head: Normocephalic and atraumatic.     Right Ear: External ear normal.     Left Ear: External ear normal.     Nose: No congestion.  Eyes:     Extraocular Movements: Extraocular movements intact.  Cardiovascular:     Rate and Rhythm: Normal rate.     Pulses: Normal pulses.  Pulmonary:     Effort: Pulmonary effort is normal. No respiratory distress.  Abdominal:     General: There is no distension.     Palpations: Abdomen is soft.  Musculoskeletal:        General: Signs of injury present. No tenderness.     Cervical back: Neck supple.     Right lower leg: Edema present.     Left lower leg: No edema.     Comments: Patient ambulates with a cane.  He does have a surgical boot on the right.  He has good strength with dorsiflexion plantarflexion on the left.  He has some back pain going from sit to stand and facet joint stimulation with extension.  No focal trigger points.  He does have dysesthesia subjectively with light touch in a stocking distribution.  Negative slump test.  Skin:    Findings: No erythema or rash.  Neurological:     General: No focal deficit present.     Mental Status: He is alert and oriented to person, place, and time.     Sensory: Sensory deficit present.     Motor: No weakness or abnormal muscle tone.     Coordination: Coordination normal.     Gait: Gait abnormal.  Psychiatric:        Mood and Affect: Mood normal.        Behavior: Behavior normal.     Ortho  Exam  Imaging: No results found.  Past Medical/Family/Surgical/Social History: Medications & Allergies reviewed per EMR, new medications updated. Patient Active Problem List   Diagnosis Date Noted  . Osteomyelitis of fifth toe of right foot ([place])   . Alcohol use disorder, severe, dependence ([place]) 04/16/2019  . 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[place]) 04/16/2019  . Cannabis hyperemesis syndrome concurrent with and due to cannabis abuse ([place]) 08/01/2018  . Gastroesophageal reflux disease 07/17/2018  . Long QT interval 07/16/2018  . Early satiety   . Esophagitis, [place] grade D   . Hypercalcemia 06/09/2018  . SIRS (systemic inflammatory response syndrome) ([place]) 06/09/2018  . Hematemesis 06/09/2018  . Type 2 diabetes mellitus with peripheral neuropathy ([place]) 06/09/2018  . Closed displaced fracture of first metatarsal bone of left foot 07/31/2017  . Acidosis 09/17/2016  . Dehydration 09/17/2016  . Abdominal pain 09/17/2016  . DKA (diabetic ketoacidoses) 09/17/2016  . Nausea and vomiting 09/17/2016  . IDDM (insulin dependent diabetes mellitus)   . Gastroparesis   . Diabetic peripheral neuropathy ([place]) 11/30/2015  . Chronic low back pain 11/30/2015  . Coronary artery spasm ([place]) 08/17/2013  . Hyperlipidemia 08/17/2013  . Peripheral neuropathy 08/05/2013  . Chest pain 08/05/2013  . Fatigue 08/05/2013  . Tachycardia 08/05/2013  . DOE (dyspnea on exertion) 08/05/2013  . Diabetes mellitus, insulin dependent (IDDM), uncontrolled 02/09/2008  . Anxiety state 02/09/2008  . Essential hypertension 02/09/2008  . ALCOHOL ABUSE, HX OF 02/09/2008  . LIVER FUNCTION TESTS, ABNORMAL, HX OF 02/09/2008  . NEOPLASM, BENIGN, ESOPHAGUS 09/16/2007  . Gastritis 09/16/2007   Past Medical History:  Diagnosis Date  . Anxiety   . Chronic low back pain 11/30/2015  . Depression   . Diabetic peripheral neuropathy ([place]) 11/30/2015  . Hypertension   . Neuropathy    . Type 2 diabetes mellitus (HCC)    Family History  Problem Relation Age of Onset  . Diabetes Mother   . Hypertension  Mother   . Hypertension Father   . Hyperlipidemia Father   . Alcohol abuse Brother   . Brain cancer Other        grandparent  . Stomach cancer Other        grandparent  . Stomach cancer Paternal Grandfather   . Colon cancer Neg Hx   . Esophageal cancer Neg Hx   . Rectal cancer Neg Hx    Past Surgical History:  Procedure Laterality Date  . AMPUTATION Right 12/07/2020   Procedure: RIGHT FOOT 5TH RAY AMPUTATION;  Surgeon: [name], MD;  Location: [place];  Service: Orthopedics;  Laterality: Right;  . back lipoma resection  03/2006  . BIOPSY  06/15/2018   Procedure: BIOPSY;  Surgeon: Jerene Bears, MD;  Location: [place];  Service: Gastroenterology;;  . CARDIAC CATHETERIZATION    . ESOPHAGOGASTRODUODENOSCOPY (EGD) WITH PROPOFOL N/A 06/15/2018   Procedure: ESOPHAGOGASTRODUODENOSCOPY (EGD) WITH PROPOFOL;  Surgeon: Jerene Bears, MD;  Location: [place];  Service: Gastroenterology;  Laterality: N/A;  . KNEE ARTHROSCOPY Left 10/2008   Meniscus Repair  . KNEE ARTHROSCOPY W/ ACL RECONSTRUCTION Left 1998  . LEFT HEART CATHETERIZATION WITH CORONARY ANGIOGRAM N/A 08/11/2013   Procedure: LEFT HEART CATHETERIZATION WITH CORONARY ANGIOGRAM;  Surgeon: Pixie Casino, MD;  Location: [facility] LAB;  Service: Cardiovascular;  Laterality: N/A;  . LOWER EXTREMITY ANGIOGRAPHY Right 11/10/2020   Procedure: LOWER EXTREMITY ANGIOGRAPHY;  Surgeon: Algernon Huxley, MD;  Location: [place] CV LAB;  Service: Cardiovascular;  Laterality: Right;  . MENISCUS REPAIR Left 2001  . UPPER GI ENDOSCOPY  10/2007   showed gastritis   Social History   Occupational History  . Occupation: disability  Tobacco Use  . Smoking status: Former Smoker    Packs/day: 0.50    Years: 20.00    Pack years: 10.00    Quit date: 03/23/2016    Years since quitting: 4.9  . Smokeless tobacco: Current  User    Types: Chew  . Tobacco comment:  chews daily  Vaping Use  . Vaping Use: Never used  Substance and Sexual Activity  . Alcohol use: No    Comment: Recovered alcoholic  . Drug use: No  . Sexual activity: Not on file

## 2021-02-17 ENCOUNTER — Ambulatory Visit (INDEPENDENT_AMBULATORY_CARE_PROVIDER_SITE_OTHER): Payer: Medicare HMO | Admitting: Family

## 2021-02-17 DIAGNOSIS — L03115 Cellulitis of right lower limb: Secondary | ICD-10-CM

## 2021-02-17 DIAGNOSIS — M869 Osteomyelitis, unspecified: Secondary | ICD-10-CM

## 2021-02-17 MED ORDER — SULFAMETHOXAZOLE-TRIMETHOPRIM 800-160 MG PO TABS: 1.0000 | ORAL_TABLET | Freq: Two times a day (BID) | ORAL | 0 refills | Status: DC

## 2021-02-17 MED ORDER — EUCERIN EX CREA: TOPICAL_CREAM | CUTANEOUS | 0 refills | Status: DC | PRN

## 2021-02-22 ENCOUNTER — Encounter: Payer: Self-pay | Admitting: Family

## 2021-02-22 NOTE — Progress Notes (Signed)
Office Visit Note   Patient: Grant [name]           Date of Birth: 1971-03-11           MRN: [id number] Visit Date: 02/17/2021              Requested by: [name], [address] [place],  [place] [address] PCP: [name], MD  Chief Complaint  Patient presents with  . Right Foot - Routine Post Op    12/07/20 right foot 5th ray amputation       HPI: The patient is a 50 year old gentleman who is seen status post right foot fifth ray amputation on January 12.  This is been slow to heal.  Today he presents for concern of new ulcer possible infection over his anterior ankle of the right foot.  He has been wearing a cam walker and the strap has unfortunately rubbed a wound.  He is complaining of some surrounding redness.  This just started yesterday.  He is not taking an antibiotic he did complete a course a couple weeks ago   he is using a nitroglycerin patch as well as taking Trental to assist with healing of the ray amputation  Assessment & Plan: Visit Diagnoses: No diagnosis found.  Plan: We will place the patient on Bactrim.  Discussed strict return precautions.  He will discontinue the cam walker.  Antibacterial ointment dressing changes to the anterior ankle ulcer.  We will do the same to the 1 remaining area on his incision that has not yet healed  Follow-Up Instructions: Return in about 1 week (around 02/24/2021).   Ortho Exam  Patient is alert, oriented, no adenopathy, well-dressed, normal affect, normal respiratory effort. On examination of the right foot and ankle.  There is mild edema of the ankle there is a 5 mm in diameter ulcer over his anterior ankle this is filled in with 100% fibrinous tissue.  There is surrounding erythema and warmth.  No ascending cellulitis.  No active drainage.  The right amputation incision is nearly healed there are some callus to the center of the incision there is 1 remaining open area to middle of incision with one drop of  dried blood. No surrounding erythema. Does not probe. Nonviable callused tissue debrided with a 10 blade knife back to viable tissue.   Imaging: No results found. No images are attached to the encounter.  Labs: Lab Results  Component Value Date   HGBA1C 9.4 (H) 06/09/2018   ESRSEDRATE 2 11/30/2015   REPTSTATUS 12/12/2020 FINAL 12/07/2020   GRAMSTAIN NO WBC SEEN NO ORGANISMS SEEN  12/07/2020   CULT  12/07/2020    No growth aerobically or anaerobically. Performed at [facility], [place] [address], [place], [place] [address]      Lab Results  Component Value Date   ALBUMIN 4.5 08/01/2018   ALBUMIN 3.7 07/28/2018   ALBUMIN 4.2 07/27/2018    Lab Results  Component Value Date   MG 2.0 07/30/2018   MG 2.0 07/28/2018   MG 1.8 07/27/2018   No results found for: VD25OH  No results found for: PREALBUMIN CBC EXTENDED Latest Ref Rng & Units 12/07/2020 08/01/2018 07/30/2018  WBC 4.0 - 10.5 K/uL 11.8(H) 12.6(H) 8.1  RBC 4.22 - 5.81 MIL/uL 5.05 5.23 4.44  HGB 13.0 - 17.0 g/dL 14.5 15.9 13.2  HCT 39.0 - 52.0 % 42.7 43.8 38.4(L)  PLT 150 - 400 K/uL 254 222 175  NEUTROABS 1.7 - 7.7 K/uL -  10.0(H) -  LYMPHSABS 0.7 - 4.0 K/uL - 1.5 -     There is no height or weight on file to calculate BMI.  Orders:  No orders of the defined types were placed in this encounter.  Meds ordered this encounter  Medications  . sulfamethoxazole-trimethoprim (BACTRIM DS) 800-160 MG tablet    Sig: Take 1 tablet by mouth 2 (two) times daily.    Dispense:  20 tablet    Refill:  0  . Skin Protectants, Misc. (EUCERIN) cream    Sig: Apply topically as needed for dry skin.    Dispense:  454 g    Refill:  0     Procedures: No procedures performed  Clinical Data: No additional findings.  ROS:  All other systems negative, except as noted in the HPI. Review of Systems  Constitutional: Negative for chills and fever.  Skin: Positive for color change and wound.    Objective: Vital Signs: There  were no vitals taken for this visit.  Specialty Comments:  No specialty comments available.  PMFS History: Patient Active Problem List   Diagnosis Date Noted  . Osteomyelitis of fifth toe of right foot ([place])   . Alcohol use disorder, severe, dependence ([place]) 04/16/2019  . 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[place]) 04/16/2019  . Cannabis hyperemesis syndrome concurrent with and due to cannabis abuse ([place]) 08/01/2018  . Gastroesophageal reflux disease 07/17/2018  . Long QT interval 07/16/2018  . Early satiety   . Esophagitis, [place] grade D   . Hypercalcemia 06/09/2018  . SIRS (systemic inflammatory response syndrome) ([place]) 06/09/2018  . Hematemesis 06/09/2018  . Type 2 diabetes mellitus with peripheral neuropathy ([place]) 06/09/2018  . Closed displaced fracture of first metatarsal bone of left foot 07/31/2017  . Acidosis 09/17/2016  . Dehydration 09/17/2016  . Abdominal pain 09/17/2016  . DKA (diabetic ketoacidoses) 09/17/2016  . Nausea and vomiting 09/17/2016  . IDDM (insulin dependent diabetes mellitus)   . Gastroparesis   . Diabetic peripheral neuropathy ([place]) 11/30/2015  . Chronic low back pain 11/30/2015  . Coronary artery spasm ([place]) 08/17/2013  . Hyperlipidemia 08/17/2013  . Peripheral neuropathy 08/05/2013  . Chest pain 08/05/2013  . Fatigue 08/05/2013  . Tachycardia 08/05/2013  . DOE (dyspnea on exertion) 08/05/2013  . Diabetes mellitus, insulin dependent (IDDM), uncontrolled 02/09/2008  . Anxiety state 02/09/2008  . Essential hypertension 02/09/2008  . ALCOHOL ABUSE, HX OF 02/09/2008  . LIVER FUNCTION TESTS, ABNORMAL, HX OF 02/09/2008  . NEOPLASM, BENIGN, ESOPHAGUS 09/16/2007  . Gastritis 09/16/2007   Past Medical History:  Diagnosis Date  . Anxiety   . Chronic low back pain 11/30/2015  . Depression   . Diabetic peripheral neuropathy ([place]) 11/30/2015  . Hypertension   . Neuropathy   . Type 2 diabetes mellitus (HCC)      Family History  Problem Relation Age of Onset  . Diabetes Mother   . Hypertension Mother   . Hypertension Father   . Hyperlipidemia Father   . Alcohol abuse Brother   . Brain cancer Other        grandparent  . Stomach cancer Other        grandparent  . Stomach cancer Paternal Grandfather   . Colon cancer Neg Hx   . Esophageal cancer Neg Hx   . Rectal cancer Neg Hx     Past Surgical History:  Procedure Laterality Date  . AMPUTATION Right 12/07/2020   Procedure: RIGHT FOOT 5TH RAY AMPUTATION;  Surgeon:  [name], MD;  Location: [place];  Service: Orthopedics;  Laterality: Right;  . back lipoma resection  03/2006  . BIOPSY  06/15/2018   Procedure: BIOPSY;  Surgeon: Jerene Bears, MD;  Location: [place];  Service: Gastroenterology;;  . CARDIAC CATHETERIZATION    . ESOPHAGOGASTRODUODENOSCOPY (EGD) WITH PROPOFOL N/A 06/15/2018   Procedure: ESOPHAGOGASTRODUODENOSCOPY (EGD) WITH PROPOFOL;  Surgeon: Jerene Bears, MD;  Location: [place];  Service: Gastroenterology;  Laterality: N/A;  . KNEE ARTHROSCOPY Left 10/2008   Meniscus Repair  . KNEE ARTHROSCOPY W/ ACL RECONSTRUCTION Left 1998  . LEFT HEART CATHETERIZATION WITH CORONARY ANGIOGRAM N/A 08/11/2013   Procedure: LEFT HEART CATHETERIZATION WITH CORONARY ANGIOGRAM;  Surgeon: Pixie Casino, MD;  [facility] C[facility] LAB;  Service: Cardiovascular;  Laterality: N/A;  . LOWER EXTREMITY ANGIOGRAPHY Right 11/10/2020   Procedure: LOWER EXTREMITY ANGIOGRAPHY;  Surgeon: Algernon Huxley, MD;  Location: [place] CV LAB;  Service: Cardiovascular;  Laterality: Right;  . MENISCUS REPAIR Left 2001  . UPPER GI ENDOSCOPY  10/2007   showed gastritis   Social History   Occupational History  . Occupation: disability  Tobacco Use  . Smoking status: Former Smoker    Packs/day: 0.50    Years: 20.00    Pack years: 10.00    Quit date: 03/23/2016    Years since quitting: 4.9  . Smokeless tobacco: Current User    Types: Chew  . Tobacco  comment:  chews daily  Vaping Use  . Vaping Use: Never used  Substance and Sexual Activity  . Alcohol use: No    Comment: Recovered alcoholic  . Drug use: No  . Sexual activity: Not on file

## 2021-02-23 ENCOUNTER — Ambulatory Visit (INDEPENDENT_AMBULATORY_CARE_PROVIDER_SITE_OTHER): Payer: Medicare HMO | Admitting: Orthopedic Surgery

## 2021-02-23 ENCOUNTER — Encounter: Payer: Self-pay | Admitting: Orthopedic Surgery

## 2021-02-23 DIAGNOSIS — Z89421 Acquired absence of other right toe(s): Secondary | ICD-10-CM

## 2021-02-23 NOTE — Progress Notes (Signed)
Office Visit Note   Patient: Grant [name]           Date of Birth: [birth year]           MRN: [id number] Visit Date: 02/23/2021              Requested by: [name], [place] [place],  [place] [address] PCP: [name], MD  Chief Complaint  Patient presents with  . Right Foot - Follow-up      HPI: Patient is a 50 year old gentleman who is status post right foot fifth ray amputation.  Patient has been using the nitroglycerin patch and Trental.  Assessment & Plan: Visit Diagnoses:  1. History of complete ray amputation of fifth toe of right foot ([place])     Plan: Recommended a stiff soled sneaker continue with the nitroglycerin patch and Trental.  Recommended omega-3 2000 mg a day and vitamin D3 2000 international units.  Follow-Up Instructions: Return in about 4 weeks (around 03/23/2021).   Ortho Exam  Patient is alert, oriented, no adenopathy, well-dressed, normal affect, normal respiratory effort. Examination patient has shown excellent healing of the incision.  There is no cellulitis no drainage no odor there is a very thin small scab distally no signs of infection no wound breakdown.  Patient's foot is plantigrade with ambulation.  Imaging: No results found. No images are attached to the encounter.  Labs: Lab Results  Component Value Date   HGBA1C 9.4 (H) 06/09/2018   ESRSEDRATE 2 11/30/2015   REPTSTATUS 12/12/2020 FINAL 12/07/2020   GRAMSTAIN NO WBC SEEN NO ORGANISMS SEEN  12/07/2020   CULT  12/07/2020    No growth aerobically or anaerobically. Performed at [facility], [place] [address] [place], [place] [address]      Lab Results  Component Value Date   ALBUMIN 4.5 08/01/2018   ALBUMIN 3.7 07/28/2018   ALBUMIN 4.2 07/27/2018    Lab Results  Component Value Date   MG 2.0 07/30/2018   MG 2.0 07/28/2018   MG 1.8 07/27/2018   No results found for: VD25OH  No results found for: PREALBUMIN CBC EXTENDED Latest Ref Rng &  Units 12/07/2020 08/01/2018 07/30/2018  WBC 4.0 - 10.5 K/uL 11.8(H) 12.6(H) 8.1  RBC 4.22 - 5.81 MIL/uL 5.05 5.23 4.44  HGB 13.0 - 17.0 g/dL 14.5 15.9 13.2  HCT 39.0 - 52.0 % 42.7 43.8 38.4(L)  PLT 150 - 400 K/uL 254 222 175  NEUTROABS 1.7 - 7.7 K/uL - 10.0(H) -  LYMPHSABS 0.7 - 4.0 K/uL - 1.5 -     There is no height or weight on file to calculate BMI.  Orders:  No orders of the defined types were placed in this encounter.  No orders of the defined types were placed in this encounter.    Procedures: No procedures performed  Clinical Data: No additional findings.  ROS:  All other systems negative, except as noted in the HPI. Review of Systems  Objective: Vital Signs: There were no vitals taken for this visit.  Specialty Comments:  No specialty comments available.  PMFS History: Patient Active Problem List   Diagnosis Date Noted  . Osteomyelitis of fifth toe of right foot ([place])   . Alcohol use disorder, severe, dependence ([place]) 04/16/2019  . 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[place]) 04/16/2019  . Cannabis hyperemesis syndrome concurrent with and due to cannabis abuse ([place]) 08/01/2018  . Gastroesophageal reflux disease 07/17/2018  . Long QT interval 07/16/2018  .  Early satiety   . Esophagitis, [place] grade D   . Hypercalcemia 06/09/2018  . SIRS (systemic inflammatory response syndrome) ([place]) 06/09/2018  . Hematemesis 06/09/2018  . Type 2 diabetes mellitus with peripheral neuropathy ([place]) 06/09/2018  . Closed displaced fracture of first metatarsal bone of left foot 07/31/2017  . Acidosis 09/17/2016  . Dehydration 09/17/2016  . Abdominal pain 09/17/2016  . DKA (diabetic ketoacidoses) 09/17/2016  . Nausea and vomiting 09/17/2016  . IDDM (insulin dependent diabetes mellitus)   . Gastroparesis   . Diabetic peripheral neuropathy ([place]) 11/30/2015  . Chronic low back pain 11/30/2015  . Coronary artery spasm ([place]) 08/17/2013  .  Hyperlipidemia 08/17/2013  . Peripheral neuropathy 08/05/2013  . Chest pain 08/05/2013  . Fatigue 08/05/2013  . Tachycardia 08/05/2013  . DOE (dyspnea on exertion) 08/05/2013  . Diabetes mellitus, insulin dependent (IDDM), uncontrolled 02/09/2008  . Anxiety state 02/09/2008  . Essential hypertension 02/09/2008  . ALCOHOL ABUSE, HX OF 02/09/2008  . LIVER FUNCTION TESTS, ABNORMAL, HX OF 02/09/2008  . NEOPLASM, BENIGN, ESOPHAGUS 09/16/2007  . Gastritis 09/16/2007   Past Medical History:  Diagnosis Date  . Anxiety   . Chronic low back pain 11/30/2015  . Depression   . Diabetic peripheral neuropathy ([place]) 11/30/2015  . Hypertension   . Neuropathy   . Type 2 diabetes mellitus ([facility])     Family History  Problem Relation Age of Onset  . Diabetes Mother   . Hypertension Mother   . Hypertension Father   . Hyperlipidemia Father   . Alcohol abuse Brother   . Brain cancer Other        grandparent  . Stomach cancer Other        grandparent  . Stomach cancer Paternal Grandfather   . Colon cancer Neg Hx   . Esophageal cancer Neg Hx   . Rectal cancer Neg Hx     Past Surgical History:  Procedure Laterality Date  . AMPUTATION Right 12/07/2020   Procedure: RIGHT FOOT 5TH RAY AMPUTATION;  Surgeon: [name], MD;  Location: [place];  Service: Orthopedics;  Laterality: Right;  . back lipoma resection  03/2006  . BIOPSY  06/15/2018   Procedure: BIOPSY;  Surgeon: Jerene Bears, MD;  Location: [facility];  Service: Gastroenterology;;  . CARDIAC CATHETERIZATION    . ESOPHAGOGASTRODUODENOSCOPY (EGD) WITH PROPOFOL N/A 06/15/2018   Procedure: ESOPHAGOGASTRODUODENOSCOPY (EGD) WITH PROPOFOL;  Surgeon: Jerene Bears, MD;  Location: [facility];  Service: Gastroenterology;  Laterality: N/A;  . KNEE ARTHROSCOPY Left 10/2008   Meniscus Repair  . KNEE ARTHROSCOPY W/ ACL RECONSTRUCTION Left 1998  . LEFT HEART CATHETERIZATION WITH CORONARY ANGIOGRAM N/A 08/11/2013   Procedure: LEFT HEART CATHETERIZATION  WITH CORONARY ANGIOGRAM;  Surgeon: Pixie Casino, MD;  Location: [facility] CATH LAB;  Service: Cardiovascular;  Laterality: N/A;  . LOWER EXTREMITY ANGIOGRAPHY Right 11/10/2020   Procedure: LOWER EXTREMITY ANGIOGRAPHY;  Surgeon: Algernon Huxley, MD;  Location: [facility] CV LAB;  Service: Cardiovascular;  Laterality: Right;  . MENISCUS REPAIR Left 2001  . UPPER GI ENDOSCOPY  10/2007   showed gastritis   Social History   Occupational History  . Occupation: disability  Tobacco Use  . Smoking status: Former Smoker    Packs/day: 0.50    Years: 20.00    Pack years: 10.00    Quit date: 03/23/2016    Years since quitting: 4.9  . Smokeless tobacco: Current User    Types: Chew  . Tobacco comment:  chews daily  Vaping Use  . Vaping Use: Never used  Substance and Sexual Activity  . Alcohol use: No    Comment: Recovered alcoholic  . Drug use: No  . Sexual activity: Not on file

## 2021-02-24 NOTE — Progress Notes (Signed)
Do yo uknow if this gets sent automatically to referring doctor if they don't use Epic?

## 2021-03-13 DIAGNOSIS — M5442 Lumbago with sciatica, left side: Secondary | ICD-10-CM | POA: Diagnosis not present

## 2021-03-13 DIAGNOSIS — M5441 Lumbago with sciatica, right side: Secondary | ICD-10-CM | POA: Diagnosis not present

## 2021-03-13 DIAGNOSIS — G8929 Other chronic pain: Secondary | ICD-10-CM | POA: Diagnosis not present

## 2021-03-13 DIAGNOSIS — M5416 Radiculopathy, lumbar region: Secondary | ICD-10-CM | POA: Diagnosis not present

## 2021-03-13 DIAGNOSIS — E1140 Type 2 diabetes mellitus with diabetic neuropathy, unspecified: Secondary | ICD-10-CM | POA: Diagnosis not present

## 2021-03-14 DIAGNOSIS — E113213 Type 2 diabetes mellitus with mild nonproliferative diabetic retinopathy with macular edema, bilateral: Secondary | ICD-10-CM | POA: Diagnosis not present

## 2021-03-14 DIAGNOSIS — H47013 Ischemic optic neuropathy, bilateral: Secondary | ICD-10-CM | POA: Diagnosis not present

## 2021-03-14 DIAGNOSIS — H35033 Hypertensive retinopathy, bilateral: Secondary | ICD-10-CM | POA: Diagnosis not present

## 2021-03-14 DIAGNOSIS — H43823 Vitreomacular adhesion, bilateral: Secondary | ICD-10-CM | POA: Diagnosis not present

## 2021-03-23 ENCOUNTER — Encounter: Payer: Self-pay | Admitting: Orthopedic Surgery

## 2021-03-23 ENCOUNTER — Ambulatory Visit (INDEPENDENT_AMBULATORY_CARE_PROVIDER_SITE_OTHER): Payer: Medicare HMO | Admitting: Physician Assistant

## 2021-03-23 DIAGNOSIS — M869 Osteomyelitis, unspecified: Secondary | ICD-10-CM

## 2021-03-23 NOTE — Progress Notes (Signed)
Office Visit Note   Patient: Grant [name]           Date of Birth: Sep 17, 1971           MRN: [id number] Visit Date: 03/23/2021              Requested by: [name], [address] [place],  [place] [address] PCP: [name], MD  Chief Complaint  Patient presents with  . Right Foot - Follow-up    12/07/20 right foot 5th ray amputation       HPI: Patient presents in follow-up today he is 3-1/2 months status post right foot fifth ray amputation.  He has been using a nitroglycerin patch.  He feels like he is doing much better.  He also has a small area of scabbing on his ankle that he took some antibiotics for a couple weeks ago.  He wants to make sure this is okay .  He was having custom orthotics made by [facility] but they are very expensive wondering if there is an alternative Assessment & Plan: Visit Diagnoses: No diagnosis found.  Plan: He could try sole cork orthotics.  He is doing well may follow-up as needed if any concerns  Follow-Up Instructions: No follow-ups on file.   Ortho Exam  Patient is alert, oriented, no adenopathy, well-dressed, normal affect, normal respiratory effort. Right foot well-healed surgical incision at the distal end he does have some very thin eschar.  No erythema no ascending cellulitis.  No swelling.  On his anterior ankle he has a small superficial scab.  There is no evidence of any type of infection going on no fluctuance  Imaging: No results found. No images are attached to the encounter.  Labs: Lab Results  Component Value Date   HGBA1C 9.4 (H) 06/09/2018   ESRSEDRATE 2 11/30/2015   REPTSTATUS 12/12/2020 FINAL 12/07/2020   GRAMSTAIN NO WBC SEEN NO ORGANISMS SEEN  12/07/2020   CULT  12/07/2020    No growth aerobically or anaerobically. Performed at [facility], [place] [address] [place], [place] [address]      Lab Results  Component Value Date   ALBUMIN 4.5 08/01/2018   ALBUMIN 3.7 07/28/2018    ALBUMIN 4.2 07/27/2018    Lab Results  Component Value Date   MG 2.0 07/30/2018   MG 2.0 07/28/2018   MG 1.8 07/27/2018   No results found for: VD25OH  No results found for: PREALBUMIN CBC EXTENDED Latest Ref Rng & Units 12/07/2020 08/01/2018 07/30/2018  WBC 4.0 - 10.5 K/uL 11.8(H) 12.6(H) 8.1  RBC 4.22 - 5.81 MIL/uL 5.05 5.23 4.44  HGB 13.0 - 17.0 g/dL 14.5 15.9 13.2  HCT 39.0 - 52.0 % 42.7 43.8 38.4(L)  PLT 150 - 400 K/uL 254 222 175  NEUTROABS 1.7 - 7.7 K/uL - 10.0(H) -  LYMPHSABS 0.7 - 4.0 K/uL - 1.5 -     There is no height or weight on file to calculate BMI.  Orders:  No orders of the defined types were placed in this encounter.  No orders of the defined types were placed in this encounter.    Procedures: No procedures performed  Clinical Data: No additional findings.  ROS:  All other systems negative, except as noted in the HPI. Review of Systems  Objective: Vital Signs: There were no vitals taken for this visit.  Specialty Comments:  No specialty comments available.  PMFS History: Patient Active Problem List   Diagnosis Date Noted  .  Osteomyelitis of fifth toe of right foot ([place])   . Alcohol use disorder, severe, dependence ([place]) 04/16/2019  . 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[place]) 04/16/2019  . Cannabis hyperemesis syndrome concurrent with and due to cannabis abuse ([place]) 08/01/2018  . Gastroesophageal reflux disease 07/17/2018  . Long QT interval 07/16/2018  . Early satiety   . Esophagitis, [place] grade D   . Hypercalcemia 06/09/2018  . SIRS (systemic inflammatory response syndrome) ([place]) 06/09/2018  . Hematemesis 06/09/2018  . Type 2 diabetes mellitus with peripheral neuropathy ([place]) 06/09/2018  . Closed displaced fracture of first metatarsal bone of left foot 07/31/2017  . Acidosis 09/17/2016  . Dehydration 09/17/2016  . Abdominal pain 09/17/2016  . DKA (diabetic ketoacidoses) 09/17/2016  . Nausea  and vomiting 09/17/2016  . IDDM (insulin dependent diabetes mellitus)   . Gastroparesis   . Diabetic peripheral neuropathy ([place]) 11/30/2015  . Chronic low back pain 11/30/2015  . Coronary artery spasm ([place]) 08/17/2013  . Hyperlipidemia 08/17/2013  . Peripheral neuropathy 08/05/2013  . Chest pain 08/05/2013  . Fatigue 08/05/2013  . Tachycardia 08/05/2013  . DOE (dyspnea on exertion) 08/05/2013  . Diabetes mellitus, insulin dependent (IDDM), uncontrolled 02/09/2008  . Anxiety state 02/09/2008  . Essential hypertension 02/09/2008  . ALCOHOL ABUSE, HX OF 02/09/2008  . LIVER FUNCTION TESTS, ABNORMAL, HX OF 02/09/2008  . NEOPLASM, BENIGN, ESOPHAGUS 09/16/2007  . Gastritis 09/16/2007   Past Medical History:  Diagnosis Date  . Anxiety   . Chronic low back pain 11/30/2015  . Depression   . Diabetic peripheral neuropathy ([place]) 11/30/2015  . Hypertension   . Neuropathy   . Type 2 diabetes mellitus ([facility])     Family History  Problem Relation Age of Onset  . Diabetes Mother   . Hypertension Mother   . Hypertension Father   . Hyperlipidemia Father   . Alcohol abuse Brother   . Brain cancer Other        grandparent  . Stomach cancer Other        grandparent  . Stomach cancer Paternal Grandfather   . Colon cancer Neg Hx   . Esophageal cancer Neg Hx   . Rectal cancer Neg Hx     Past Surgical History:  Procedure Laterality Date  . AMPUTATION Right 12/07/2020   Procedure: RIGHT FOOT 5TH RAY AMPUTATION;  Surgeon: [name], MD;  Location: [facility];  Service: Orthopedics;  Laterality: Right;  . back lipoma resection  03/2006  . BIOPSY  06/15/2018   Procedure: BIOPSY;  Surgeon: Jerene Bears, MD;  Location: [facility];  Service: Gastroenterology;;  . CARDIAC CATHETERIZATION    . ESOPHAGOGASTRODUODENOSCOPY (EGD) WITH PROPOFOL N/A 06/15/2018   Procedure: ESOPHAGOGASTRODUODENOSCOPY (EGD) WITH PROPOFOL;  Surgeon: Jerene Bears, MD;  Location: [facility];  Service: Gastroenterology;   Laterality: N/A;  . KNEE ARTHROSCOPY Left 10/2008   Meniscus Repair  . KNEE ARTHROSCOPY W/ ACL RECONSTRUCTION Left 1998  . LEFT HEART CATHETERIZATION WITH CORONARY ANGIOGRAM N/A 08/11/2013   Procedure: LEFT HEART CATHETERIZATION WITH CORONARY ANGIOGRAM;  Surgeon: Pixie Casino, MD;  Location: [facility] CATH LAB;  Service: Cardiovascular;  Laterality: N/A;  . LOWER EXTREMITY ANGIOGRAPHY Right 11/10/2020   Procedure: LOWER EXTREMITY ANGIOGRAPHY;  Surgeon: Algernon Huxley, MD;  Location: [facility] CV LAB;  Service: Cardiovascular;  Laterality: Right;  . MENISCUS REPAIR Left 2001  . UPPER GI ENDOSCOPY  10/2007   showed gastritis   Social History   Occupational History  .  Occupation: disability  Tobacco Use  . Smoking status: Former Smoker    Packs/day: 0.50    Years: 20.00    Pack years: 10.00    Quit date: 03/23/2016    Years since quitting: 5.0  . Smokeless tobacco: Current User    Types: Chew  . Tobacco comment:  chews daily  Vaping Use  . Vaping Use: Never used  Substance and Sexual Activity  . Alcohol use: No    Comment: Recovered alcoholic  . Drug use: No  . Sexual activity: Not on file

## 2021-03-24 DIAGNOSIS — E119 Type 2 diabetes mellitus without complications: Secondary | ICD-10-CM | POA: Diagnosis not present

## 2021-03-24 DIAGNOSIS — E1140 Type 2 diabetes mellitus with diabetic neuropathy, unspecified: Secondary | ICD-10-CM | POA: Diagnosis not present

## 2021-03-24 DIAGNOSIS — H47013 Ischemic optic neuropathy, bilateral: Secondary | ICD-10-CM | POA: Diagnosis not present

## 2021-03-29 DIAGNOSIS — R5381 Other malaise: Secondary | ICD-10-CM | POA: Diagnosis not present

## 2021-03-29 DIAGNOSIS — M5416 Radiculopathy, lumbar region: Secondary | ICD-10-CM | POA: Diagnosis not present

## 2021-03-29 DIAGNOSIS — M79605 Pain in left leg: Secondary | ICD-10-CM | POA: Diagnosis not present

## 2021-03-29 DIAGNOSIS — R29898 Other symptoms and signs involving the musculoskeletal system: Secondary | ICD-10-CM | POA: Diagnosis not present

## 2021-03-29 DIAGNOSIS — M79604 Pain in right leg: Secondary | ICD-10-CM | POA: Diagnosis not present

## 2021-03-30 DIAGNOSIS — E7800 Pure hypercholesterolemia, unspecified: Secondary | ICD-10-CM | POA: Diagnosis not present

## 2021-03-30 DIAGNOSIS — I10 Essential (primary) hypertension: Secondary | ICD-10-CM | POA: Diagnosis not present

## 2021-03-30 DIAGNOSIS — E1140 Type 2 diabetes mellitus with diabetic neuropathy, unspecified: Secondary | ICD-10-CM | POA: Diagnosis not present

## 2021-03-30 DIAGNOSIS — E1165 Type 2 diabetes mellitus with hyperglycemia: Secondary | ICD-10-CM | POA: Diagnosis not present

## 2021-03-30 DIAGNOSIS — I739 Peripheral vascular disease, unspecified: Secondary | ICD-10-CM | POA: Diagnosis not present

## 2021-04-04 DIAGNOSIS — M5416 Radiculopathy, lumbar region: Secondary | ICD-10-CM | POA: Diagnosis not present

## 2021-04-04 DIAGNOSIS — R5381 Other malaise: Secondary | ICD-10-CM | POA: Diagnosis not present

## 2021-04-04 DIAGNOSIS — M79605 Pain in left leg: Secondary | ICD-10-CM | POA: Diagnosis not present

## 2021-04-04 DIAGNOSIS — R29898 Other symptoms and signs involving the musculoskeletal system: Secondary | ICD-10-CM | POA: Diagnosis not present

## 2021-04-04 DIAGNOSIS — M79604 Pain in right leg: Secondary | ICD-10-CM | POA: Diagnosis not present

## 2021-04-05 DIAGNOSIS — E119 Type 2 diabetes mellitus without complications: Secondary | ICD-10-CM | POA: Diagnosis not present

## 2021-04-05 DIAGNOSIS — E1140 Type 2 diabetes mellitus with diabetic neuropathy, unspecified: Secondary | ICD-10-CM | POA: Diagnosis not present

## 2021-04-06 DIAGNOSIS — E1165 Type 2 diabetes mellitus with hyperglycemia: Secondary | ICD-10-CM | POA: Diagnosis not present

## 2021-04-06 DIAGNOSIS — E7800 Pure hypercholesterolemia, unspecified: Secondary | ICD-10-CM | POA: Diagnosis not present

## 2021-04-12 DIAGNOSIS — M79605 Pain in left leg: Secondary | ICD-10-CM | POA: Diagnosis not present

## 2021-04-12 DIAGNOSIS — M5416 Radiculopathy, lumbar region: Secondary | ICD-10-CM | POA: Diagnosis not present

## 2021-04-12 DIAGNOSIS — R29898 Other symptoms and signs involving the musculoskeletal system: Secondary | ICD-10-CM | POA: Diagnosis not present

## 2021-04-12 DIAGNOSIS — R5381 Other malaise: Secondary | ICD-10-CM | POA: Diagnosis not present

## 2021-04-12 DIAGNOSIS — M79604 Pain in right leg: Secondary | ICD-10-CM | POA: Diagnosis not present

## 2021-04-14 DIAGNOSIS — M5416 Radiculopathy, lumbar region: Secondary | ICD-10-CM | POA: Diagnosis not present

## 2021-04-14 DIAGNOSIS — M5441 Lumbago with sciatica, right side: Secondary | ICD-10-CM | POA: Diagnosis not present

## 2021-04-14 DIAGNOSIS — M5442 Lumbago with sciatica, left side: Secondary | ICD-10-CM | POA: Diagnosis not present

## 2021-04-14 DIAGNOSIS — E1140 Type 2 diabetes mellitus with diabetic neuropathy, unspecified: Secondary | ICD-10-CM | POA: Diagnosis not present

## 2021-04-14 DIAGNOSIS — G8929 Other chronic pain: Secondary | ICD-10-CM | POA: Diagnosis not present

## 2021-04-17 DIAGNOSIS — R29898 Other symptoms and signs involving the musculoskeletal system: Secondary | ICD-10-CM | POA: Diagnosis not present

## 2021-04-17 DIAGNOSIS — M79605 Pain in left leg: Secondary | ICD-10-CM | POA: Diagnosis not present

## 2021-04-17 DIAGNOSIS — M79604 Pain in right leg: Secondary | ICD-10-CM | POA: Diagnosis not present

## 2021-04-17 DIAGNOSIS — R5381 Other malaise: Secondary | ICD-10-CM | POA: Diagnosis not present

## 2021-04-17 DIAGNOSIS — M5416 Radiculopathy, lumbar region: Secondary | ICD-10-CM | POA: Diagnosis not present

## 2021-04-21 DIAGNOSIS — E785 Hyperlipidemia, unspecified: Secondary | ICD-10-CM | POA: Diagnosis not present

## 2021-04-21 DIAGNOSIS — I739 Peripheral vascular disease, unspecified: Secondary | ICD-10-CM | POA: Diagnosis not present

## 2021-04-21 DIAGNOSIS — I10 Essential (primary) hypertension: Secondary | ICD-10-CM | POA: Diagnosis not present

## 2021-04-21 DIAGNOSIS — Z794 Long term (current) use of insulin: Secondary | ICD-10-CM | POA: Diagnosis not present

## 2021-04-21 DIAGNOSIS — I2510 Atherosclerotic heart disease of native coronary artery without angina pectoris: Secondary | ICD-10-CM | POA: Diagnosis not present

## 2021-04-21 DIAGNOSIS — F419 Anxiety disorder, unspecified: Secondary | ICD-10-CM | POA: Diagnosis not present

## 2021-04-21 DIAGNOSIS — F5101 Primary insomnia: Secondary | ICD-10-CM | POA: Diagnosis not present

## 2021-04-23 DIAGNOSIS — E119 Type 2 diabetes mellitus without complications: Secondary | ICD-10-CM | POA: Diagnosis not present

## 2021-04-23 DIAGNOSIS — E1140 Type 2 diabetes mellitus with diabetic neuropathy, unspecified: Secondary | ICD-10-CM | POA: Diagnosis not present

## 2021-04-25 DIAGNOSIS — H47013 Ischemic optic neuropathy, bilateral: Secondary | ICD-10-CM | POA: Diagnosis not present

## 2021-04-25 DIAGNOSIS — E113213 Type 2 diabetes mellitus with mild nonproliferative diabetic retinopathy with macular edema, bilateral: Secondary | ICD-10-CM | POA: Diagnosis not present

## 2021-04-25 DIAGNOSIS — H35033 Hypertensive retinopathy, bilateral: Secondary | ICD-10-CM | POA: Diagnosis not present

## 2021-04-25 DIAGNOSIS — H43823 Vitreomacular adhesion, bilateral: Secondary | ICD-10-CM | POA: Diagnosis not present

## 2021-04-28 DIAGNOSIS — I2510 Atherosclerotic heart disease of native coronary artery without angina pectoris: Secondary | ICD-10-CM | POA: Diagnosis not present

## 2021-04-28 DIAGNOSIS — E1140 Type 2 diabetes mellitus with diabetic neuropathy, unspecified: Secondary | ICD-10-CM | POA: Diagnosis not present

## 2021-04-28 DIAGNOSIS — Z794 Long term (current) use of insulin: Secondary | ICD-10-CM | POA: Diagnosis not present

## 2021-04-28 DIAGNOSIS — I10 Essential (primary) hypertension: Secondary | ICD-10-CM | POA: Diagnosis not present

## 2021-04-28 DIAGNOSIS — Z4681 Encounter for fitting and adjustment of insulin pump: Secondary | ICD-10-CM | POA: Diagnosis not present

## 2021-05-05 DIAGNOSIS — H4710 Unspecified papilledema: Secondary | ICD-10-CM | POA: Diagnosis not present

## 2021-05-05 DIAGNOSIS — H47013 Ischemic optic neuropathy, bilateral: Secondary | ICD-10-CM | POA: Diagnosis not present

## 2021-05-10 DIAGNOSIS — G4710 Hypersomnia, unspecified: Secondary | ICD-10-CM | POA: Diagnosis not present

## 2021-05-11 DIAGNOSIS — E11621 Type 2 diabetes mellitus with foot ulcer: Secondary | ICD-10-CM | POA: Diagnosis not present

## 2021-05-11 DIAGNOSIS — E7800 Pure hypercholesterolemia, unspecified: Secondary | ICD-10-CM | POA: Diagnosis not present

## 2021-05-11 DIAGNOSIS — E1140 Type 2 diabetes mellitus with diabetic neuropathy, unspecified: Secondary | ICD-10-CM | POA: Diagnosis not present

## 2021-05-11 DIAGNOSIS — Z9641 Presence of insulin pump (external) (internal): Secondary | ICD-10-CM | POA: Diagnosis not present

## 2021-05-11 DIAGNOSIS — I739 Peripheral vascular disease, unspecified: Secondary | ICD-10-CM | POA: Diagnosis not present

## 2021-05-11 DIAGNOSIS — I10 Essential (primary) hypertension: Secondary | ICD-10-CM | POA: Diagnosis not present

## 2021-05-11 DIAGNOSIS — G4710 Hypersomnia, unspecified: Secondary | ICD-10-CM | POA: Diagnosis not present

## 2021-05-11 DIAGNOSIS — E1165 Type 2 diabetes mellitus with hyperglycemia: Secondary | ICD-10-CM | POA: Diagnosis not present

## 2021-05-15 DIAGNOSIS — H748X2 Other specified disorders of left middle ear and mastoid: Secondary | ICD-10-CM | POA: Diagnosis not present

## 2021-05-15 DIAGNOSIS — H47019 Ischemic optic neuropathy, unspecified eye: Secondary | ICD-10-CM | POA: Diagnosis not present

## 2021-05-23 DIAGNOSIS — G4733 Obstructive sleep apnea (adult) (pediatric): Secondary | ICD-10-CM | POA: Diagnosis not present

## 2021-05-24 DIAGNOSIS — E119 Type 2 diabetes mellitus without complications: Secondary | ICD-10-CM | POA: Diagnosis not present

## 2021-05-24 DIAGNOSIS — E1140 Type 2 diabetes mellitus with diabetic neuropathy, unspecified: Secondary | ICD-10-CM | POA: Diagnosis not present

## 2021-05-26 DIAGNOSIS — M7918 Myalgia, other site: Secondary | ICD-10-CM | POA: Diagnosis not present

## 2021-05-26 DIAGNOSIS — M5442 Lumbago with sciatica, left side: Secondary | ICD-10-CM | POA: Diagnosis not present

## 2021-05-26 DIAGNOSIS — M5416 Radiculopathy, lumbar region: Secondary | ICD-10-CM | POA: Diagnosis not present

## 2021-05-26 DIAGNOSIS — M5441 Lumbago with sciatica, right side: Secondary | ICD-10-CM | POA: Diagnosis not present

## 2021-05-26 DIAGNOSIS — M47816 Spondylosis without myelopathy or radiculopathy, lumbar region: Secondary | ICD-10-CM | POA: Diagnosis not present

## 2021-05-26 DIAGNOSIS — E1140 Type 2 diabetes mellitus with diabetic neuropathy, unspecified: Secondary | ICD-10-CM | POA: Diagnosis not present

## 2021-06-06 DIAGNOSIS — G4733 Obstructive sleep apnea (adult) (pediatric): Secondary | ICD-10-CM | POA: Diagnosis not present

## 2021-06-07 DIAGNOSIS — I10 Essential (primary) hypertension: Secondary | ICD-10-CM | POA: Diagnosis not present

## 2021-06-07 DIAGNOSIS — E1165 Type 2 diabetes mellitus with hyperglycemia: Secondary | ICD-10-CM | POA: Diagnosis not present

## 2021-06-07 DIAGNOSIS — E11621 Type 2 diabetes mellitus with foot ulcer: Secondary | ICD-10-CM | POA: Diagnosis not present

## 2021-06-07 DIAGNOSIS — E7800 Pure hypercholesterolemia, unspecified: Secondary | ICD-10-CM | POA: Diagnosis not present

## 2021-06-12 DIAGNOSIS — M5442 Lumbago with sciatica, left side: Secondary | ICD-10-CM | POA: Diagnosis not present

## 2021-06-12 DIAGNOSIS — G8929 Other chronic pain: Secondary | ICD-10-CM | POA: Diagnosis not present

## 2021-06-12 DIAGNOSIS — M47816 Spondylosis without myelopathy or radiculopathy, lumbar region: Secondary | ICD-10-CM | POA: Diagnosis not present

## 2021-06-12 DIAGNOSIS — M5441 Lumbago with sciatica, right side: Secondary | ICD-10-CM | POA: Diagnosis not present

## 2021-06-12 DIAGNOSIS — M545 Low back pain, unspecified: Secondary | ICD-10-CM | POA: Diagnosis not present

## 2021-06-15 ENCOUNTER — Telehealth (INDEPENDENT_AMBULATORY_CARE_PROVIDER_SITE_OTHER): Payer: Self-pay | Admitting: Vascular Surgery

## 2021-06-15 NOTE — Telephone Encounter (Signed)
Patient called in stating he's been on Lipitor and Plavix since his surgery with Dr. Lucky Cowboy on 11/10/2020.  Patient initially had an appointment on 12/23/2019 but had to cancel because of a partial amputation.  Patient appointment rescheduled.

## 2021-06-21 DIAGNOSIS — G4733 Obstructive sleep apnea (adult) (pediatric): Secondary | ICD-10-CM | POA: Diagnosis not present

## 2021-06-22 DIAGNOSIS — H469 Unspecified optic neuritis: Secondary | ICD-10-CM | POA: Diagnosis not present

## 2021-06-22 DIAGNOSIS — H4710 Unspecified papilledema: Secondary | ICD-10-CM | POA: Diagnosis not present

## 2021-06-22 DIAGNOSIS — H47013 Ischemic optic neuropathy, bilateral: Secondary | ICD-10-CM | POA: Diagnosis not present

## 2021-06-23 DIAGNOSIS — M5442 Lumbago with sciatica, left side: Secondary | ICD-10-CM | POA: Diagnosis not present

## 2021-06-23 DIAGNOSIS — E1140 Type 2 diabetes mellitus with diabetic neuropathy, unspecified: Secondary | ICD-10-CM | POA: Diagnosis not present

## 2021-06-23 DIAGNOSIS — M5441 Lumbago with sciatica, right side: Secondary | ICD-10-CM | POA: Diagnosis not present

## 2021-06-23 DIAGNOSIS — G8929 Other chronic pain: Secondary | ICD-10-CM | POA: Diagnosis not present

## 2021-06-23 DIAGNOSIS — M5416 Radiculopathy, lumbar region: Secondary | ICD-10-CM | POA: Diagnosis not present

## 2021-06-23 DIAGNOSIS — E119 Type 2 diabetes mellitus without complications: Secondary | ICD-10-CM | POA: Diagnosis not present

## 2021-06-23 DIAGNOSIS — M7918 Myalgia, other site: Secondary | ICD-10-CM | POA: Diagnosis not present

## 2021-06-24 DIAGNOSIS — E1140 Type 2 diabetes mellitus with diabetic neuropathy, unspecified: Secondary | ICD-10-CM | POA: Diagnosis not present

## 2021-06-24 DIAGNOSIS — E1165 Type 2 diabetes mellitus with hyperglycemia: Secondary | ICD-10-CM | POA: Diagnosis not present

## 2021-06-24 DIAGNOSIS — E119 Type 2 diabetes mellitus without complications: Secondary | ICD-10-CM | POA: Diagnosis not present

## 2021-06-27 DIAGNOSIS — Z4681 Encounter for fitting and adjustment of insulin pump: Secondary | ICD-10-CM | POA: Diagnosis not present

## 2021-06-27 DIAGNOSIS — I10 Essential (primary) hypertension: Secondary | ICD-10-CM | POA: Diagnosis not present

## 2021-06-27 DIAGNOSIS — I2510 Atherosclerotic heart disease of native coronary artery without angina pectoris: Secondary | ICD-10-CM | POA: Diagnosis not present

## 2021-06-27 DIAGNOSIS — E1140 Type 2 diabetes mellitus with diabetic neuropathy, unspecified: Secondary | ICD-10-CM | POA: Diagnosis not present

## 2021-06-27 DIAGNOSIS — Z794 Long term (current) use of insulin: Secondary | ICD-10-CM | POA: Diagnosis not present

## 2021-06-28 DIAGNOSIS — M1712 Unilateral primary osteoarthritis, left knee: Secondary | ICD-10-CM | POA: Diagnosis not present

## 2021-07-03 DIAGNOSIS — M1732 Unilateral post-traumatic osteoarthritis, left knee: Secondary | ICD-10-CM | POA: Diagnosis not present

## 2021-07-03 DIAGNOSIS — M1712 Unilateral primary osteoarthritis, left knee: Secondary | ICD-10-CM | POA: Diagnosis not present

## 2021-07-03 DIAGNOSIS — M2342 Loose body in knee, left knee: Secondary | ICD-10-CM | POA: Diagnosis not present

## 2021-07-03 DIAGNOSIS — Z9889 Other specified postprocedural states: Secondary | ICD-10-CM | POA: Diagnosis not present

## 2021-07-04 DIAGNOSIS — M545 Low back pain, unspecified: Secondary | ICD-10-CM | POA: Diagnosis not present

## 2021-07-04 DIAGNOSIS — M48061 Spinal stenosis, lumbar region without neurogenic claudication: Secondary | ICD-10-CM | POA: Diagnosis not present

## 2021-07-04 DIAGNOSIS — M5136 Other intervertebral disc degeneration, lumbar region: Secondary | ICD-10-CM | POA: Diagnosis not present

## 2021-07-04 DIAGNOSIS — M5126 Other intervertebral disc displacement, lumbar region: Secondary | ICD-10-CM | POA: Diagnosis not present

## 2021-07-05 ENCOUNTER — Ambulatory Visit (INDEPENDENT_AMBULATORY_CARE_PROVIDER_SITE_OTHER): Payer: Medicare HMO | Admitting: Nurse Practitioner

## 2021-07-05 ENCOUNTER — Ambulatory Visit (INDEPENDENT_AMBULATORY_CARE_PROVIDER_SITE_OTHER): Payer: Medicare HMO

## 2021-07-05 ENCOUNTER — Encounter (INDEPENDENT_AMBULATORY_CARE_PROVIDER_SITE_OTHER): Payer: Self-pay | Admitting: Nurse Practitioner

## 2021-07-05 ENCOUNTER — Other Ambulatory Visit: Payer: Self-pay

## 2021-07-05 VITALS — BP 110/76 | HR 86 | Resp 16 | Wt 271.4 lb

## 2021-07-05 DIAGNOSIS — Z95820 Peripheral vascular angioplasty status with implants and grafts: Secondary | ICD-10-CM | POA: Diagnosis not present

## 2021-07-05 DIAGNOSIS — E1142 Type 2 diabetes mellitus with diabetic polyneuropathy: Secondary | ICD-10-CM

## 2021-07-05 DIAGNOSIS — E785 Hyperlipidemia, unspecified: Secondary | ICD-10-CM | POA: Diagnosis not present

## 2021-07-05 DIAGNOSIS — I70239 Atherosclerosis of native arteries of right leg with ulceration of unspecified site: Secondary | ICD-10-CM

## 2021-07-07 DIAGNOSIS — R936 Abnormal findings on diagnostic imaging of limbs: Secondary | ICD-10-CM | POA: Diagnosis not present

## 2021-07-07 DIAGNOSIS — M23332 Other meniscus derangements, other medial meniscus, left knee: Secondary | ICD-10-CM | POA: Diagnosis not present

## 2021-07-07 DIAGNOSIS — M1712 Unilateral primary osteoarthritis, left knee: Secondary | ICD-10-CM | POA: Diagnosis not present

## 2021-07-07 DIAGNOSIS — M67962 Unspecified disorder of synovium and tendon, left lower leg: Secondary | ICD-10-CM | POA: Diagnosis not present

## 2021-07-07 DIAGNOSIS — M23352 Other meniscus derangements, posterior horn of lateral meniscus, left knee: Secondary | ICD-10-CM | POA: Diagnosis not present

## 2021-07-07 DIAGNOSIS — Z9889 Other specified postprocedural states: Secondary | ICD-10-CM | POA: Diagnosis not present

## 2021-07-10 ENCOUNTER — Encounter (INDEPENDENT_AMBULATORY_CARE_PROVIDER_SITE_OTHER): Payer: Self-pay | Admitting: Nurse Practitioner

## 2021-07-10 NOTE — Progress Notes (Signed)
Subjective:    Patient ID: Grant [name], male    DOB: February 06, 1971, 50 y.o.   MRN: [id number] Chief Complaint  Patient presents with   Follow-up    [facility] 6 wk follow up    Grant [name] is a 50 year old male that returns today for noninvasive studies following angiogram on 11/10/2020.  The patient was unable to follow-up due to other number of other health and personal issues however he wanted to be sure to follow-up to keep track of his arterial health.  The patient's previous ulceration has healed.  He has also become more active as of recently and denies any significant claudication pain.  He denies any wounds or ulcers as well as no rest pain like symptoms.  He denies any fevers or chills.  Overall he is doing well.  Today the patient's noninvasive studies show a right ABI of 1.51 with a left of 1.43.  The patient has triphasic tibial artery waveforms bilaterally with good toe waveforms bilaterally.  The patient has normal TBI's bilaterally.   Review of Systems  Neurological:  Positive for numbness.  All other systems reviewed and are negative.     Objective:   Physical Exam Vitals reviewed.  HENT:     Head: Normocephalic.  Cardiovascular:     Rate and Rhythm: Normal rate.     Pulses: Decreased pulses.  Pulmonary:     Effort: Pulmonary effort is normal.  Musculoskeletal:     Right lower leg: No edema.     Left lower leg: No edema.  Skin:    General: Skin is warm and dry.  Neurological:     Mental Status: He is alert and oriented to person, place, and time.     Motor: No weakness.     Gait: Gait normal.  Psychiatric:        Mood and Affect: Mood normal.        Behavior: Behavior normal.        Thought Content: Thought content normal.        Judgment: Judgment normal.    BP 110/76 (BP Location: Right Arm)   Pulse 86   Resp 16   Wt 271 lb 6.4 oz (123.1 kg)   BMI 37.85 kg/m   Past Medical History:  Diagnosis Date   Anxiety    Chronic low back pain 11/30/2015    Depression    Diabetic peripheral neuropathy ([place]) 11/30/2015   Hypertension    Neuropathy    Type 2 diabetes mellitus ([place])     Social History   Socioeconomic History   Marital status: Married    Spouse name: Not on file   Number of children: 2   Years of education: college   Highest education level: Not on file  Occupational History   Occupation: disability  Tobacco Use   Smoking status: Former    Packs/day: 0.50    Years: 20.00    Pack years: 10.00    Types: Cigarettes    Quit date: 03/23/2016    Years since quitting: 5.3   Smokeless tobacco: Current    Types: Chew   Tobacco comments:     chews daily  Vaping Use   Vaping Use: Never used  Substance and Sexual Activity   Alcohol use: No    Comment: Recovered alcoholic   Drug use: No   Sexual activity: Not on file  Other Topics Concern   Not on file  Social History Narrative   Patient drinks 1 cup  of caffeine daily.   Patient is right handed.    Social Determinants of Health   Financial Resource Strain: Not on file  Food Insecurity: Not on file  Transportation Needs: Not on file  Physical Activity: Not on file  Stress: Not on file  Social Connections: Not on file  Intimate Partner Violence: Not on file    Past Surgical History:  Procedure Laterality Date   AMPUTATION Right 12/07/2020   Procedure: RIGHT FOOT [place];  Surgeon: [name], MD;  Location: [facility];  Service: Orthopedics;  Laterality: Right;   back lipoma resection  03/2006   BIOPSY  06/15/2018   Procedure: BIOPSY;  Surgeon: Jerene Bears, MD;  [facility];  Service: Gastroenterology;;   CARDIAC CATHETERIZATION     ESOPHAGOGASTRODUODENOSCOPY (EGD) WITH PROPOFOL N/A 06/15/2018   Procedure: ESOPHAGOGASTRODUODENOSCOPY (EGD) WITH PROPOFOL;  Surgeon: Jerene Bears, MD;  Location: [facility] ENDOSCOPY;  Service: Gastroenterology;  Laterality: N/A;   KNEE ARTHROSCOPY Left 10/2008   Meniscus Repair   KNEE ARTHROSCOPY W/ ACL RECONSTRUCTION  Left 1998   LEFT HEART CATHETERIZATION WITH CORONARY ANGIOGRAM N/A 08/11/2013   Procedure: LEFT HEART CATHETERIZATION WITH CORONARY ANGIOGRAM;  Surgeon: Pixie Casino, MD;  Location: [facility] CATH LAB;  Service: Cardiovascular;  Laterality: N/A;   LOWER EXTREMITY ANGIOGRAPHY Right 11/10/2020   Procedure: LOWER EXTREMITY ANGIOGRAPHY;  Surgeon: Algernon Huxley, MD;  Location: [facility] CV LAB;  Service: Cardiovascular;  Laterality: Right;   MENISCUS REPAIR Left 2001   UPPER GI ENDOSCOPY  10/2007   showed gastritis    Family History  Problem Relation Age of Onset   Diabetes Mother    Hypertension Mother    Hypertension Father    Hyperlipidemia Father    Alcohol abuse Brother    Brain cancer Other        grandparent   Stomach cancer Other        grandparent   Stomach cancer Paternal Grandfather    Colon cancer Neg Hx    Esophageal cancer Neg Hx    Rectal cancer Neg Hx     Allergies  Allergen Reactions   Codeine Nausea And Vomiting   Duloxetine     Other reaction(s): Other (See Comments)   Duloxetine Hcl     Other reaction(s): worsened depression   Escitalopram     Other reaction(s): nausea   Glucophage [Metformin]     Other reaction(s): diarrhea   Metformin Hcl Diarrhea and Nausea Only   Ozempic (0.25 Or 0.5 Mg-Dose) [Semaglutide(0.25 Or 0.'5mg'$ -Dos)]     Other reaction(s): vomiting   Tylox [Oxycodone-Acetaminophen] Nausea And Vomiting    CBC Latest Ref Rng & Units 12/07/2020 08/01/2018 07/30/2018  WBC 4.0 - 10.5 K/uL 11.8(H) 12.6(H) 8.1  Hemoglobin 13.0 - 17.0 g/dL 14.5 15.9 13.2  Hematocrit 39.0 - 52.0 % 42.7 43.8 38.4(L)  Platelets 150 - 400 K/uL 254 222 175      CMP     Component Value Date/Time   NA 135 12/07/2020 1027   NA 138 09/02/2013 1024   K 3.6 12/07/2020 1027   K 4.5 09/02/2013 1024   CL 100 12/07/2020 1027   CO2 23 12/07/2020 1027   CO2 26 09/02/2013 1024   GLUCOSE 195 (H) 12/07/2020 1027   GLUCOSE 261 (H) 09/02/2013 1024   BUN 8 12/07/2020 1027   BUN  9.1 09/02/2013 1024   CREATININE 0.69 12/07/2020 1027   CREATININE 0.8 09/02/2013 1024   CALCIUM 9.3 12/07/2020 1027  CALCIUM 9.9 09/02/2013 1024   PROT 7.4 08/01/2018 0749   PROT 7.2 11/30/2015 1010   PROT 7.4 09/02/2013 1024   ALBUMIN 4.5 08/01/2018 0749   ALBUMIN 4.2 09/02/2013 1024   AST 34 08/01/2018 0749   AST 12 09/02/2013 1024   ALT 22 08/01/2018 0749   ALT 17 09/02/2013 1024   ALKPHOS 69 08/01/2018 0749   ALKPHOS 67 09/02/2013 1024   BILITOT 1.5 (H) 08/01/2018 0749   BILITOT 0.90 09/02/2013 1024   GFRNONAA >60 12/07/2020 1027   GFRAA >60 08/01/2018 0749     No results found.     Assessment & Plan:   1. Atherosclerosis of native artery of right lower extremity with ulceration, unspecified ulceration site [facility])  Recommend:  The patient has evidence of atherosclerosis of the lower extremities with claudication.  The patient does not voice lifestyle limiting changes at this point in time.  Noninvasive studies do not suggest clinically significant change.  No invasive studies, angiography or surgery at this time The patient should continue walking and begin a more formal exercise program.  The patient should continue antiplatelet therapy and aggressive treatment of the lipid abnormalities  No changes in the patient's medications at this time  The patient should continue wearing graduated compression socks 10-15 mmHg strength to control the mild edema.    Patient will follow up in 1 year or sooner if issues arise.  2. Type 2 diabetes mellitus with peripheral neuropathy ([place]) Continue hypoglycemic medications as already ordered, these medications have been reviewed and there are no changes at this time.  Hgb A1C to be monitored as already arranged by primary service   3. Hyperlipidemia, unspecified hyperlipidemia type Continue statin as ordered and reviewed, no changes at this time    Current Outpatient Medications on File Prior to Visit  Medication Sig  Dispense Refill   aspirin EC 81 MG tablet Take 1 tablet (81 mg total) by mouth daily. (Patient taking differently: Take 81 mg by mouth at bedtime.) 150 tablet 2   clopidogrel (PLAVIX) 75 MG tablet Take 1 tablet (75 mg total) by mouth daily. 30 tablet 11   HYDROcodone-acetaminophen (NORCO/VICODIN) 5-325 MG tablet Take 1 tablet by mouth every 4 (four) hours as needed for moderate pain. 30 tablet 0   ibuprofen (ADVIL,MOTRIN) 200 MG tablet Take 200 mg by mouth every 6 (six) hours as needed for moderate pain or mild pain.     Insulin Human (INSULIN PUMP) SOLN Inject into the skin as directed.     losartan-hydrochlorothiazide (HYZAAR) 100-12.5 MG tablet Take 1 tablet by mouth at bedtime.     metoprolol succinate (TOPROL-XL) 50 MG 24 hr tablet Take 50 mg by mouth at bedtime.     nitroGLYCERIN (NITRODUR - DOSED IN MG/24 HR) 0.2 mg/hr patch Place 1 patch (0.2 mg total) onto the skin daily. 30 patch 12   pentoxifylline (TRENTAL) 400 MG CR tablet Take 1 tablet (400 mg total) by mouth 3 (three) times daily with meals. 90 tablet 3   pregabalin (LYRICA) 100 MG capsule Take 100 mg by mouth 2 (two) times daily.     Skin Protectants, Misc. (EUCERIN) cream Apply topically as needed for dry skin. 454 g 0   traZODone (DESYREL) 100 MG tablet Take 100 mg by mouth at bedtime.     zolpidem (AMBIEN) 10 MG tablet Take 10 mg by mouth at bedtime.     atorvastatin (LIPITOR) 10 MG tablet      insulin NPH Human (HUMULIN N,NOVOLIN  N) 100 UNIT/ML injection Inject 0.4-0.45 mLs (40-45 Units total) into the skin See admin instructions. Use 40 units every morning then use 45 units every evening (Patient not taking: Reported on 07/05/2021) 10 mL 0   insulin regular (NOVOLIN R,HUMULIN R) 100 units/mL injection Inject 4-8 Units into the skin 3 (three) times daily before meals. Sliding scale (Patient not taking: Reported on 07/05/2021)     Semaglutide (OZEMPIC, 1 MG/DOSE, [place]) Inject 0.5 mg into the skin once a week. Monday (Patient not  taking: Reported on 07/05/2021)     sulfamethoxazole-trimethoprim (BACTRIM DS) 800-160 MG tablet Take 1 tablet by mouth 2 (two) times daily. (Patient not taking: Reported on 07/05/2021) 20 tablet 0   No current facility-administered medications on file prior to visit.    There are no Patient Instructions on file for this visit. No follow-ups on file.   Kris Hartmann, NP

## 2021-07-11 DIAGNOSIS — M1732 Unilateral post-traumatic osteoarthritis, left knee: Secondary | ICD-10-CM | POA: Diagnosis not present

## 2021-07-18 DIAGNOSIS — M25562 Pain in left knee: Secondary | ICD-10-CM | POA: Diagnosis not present

## 2021-07-18 DIAGNOSIS — I10 Essential (primary) hypertension: Secondary | ICD-10-CM | POA: Diagnosis not present

## 2021-07-18 DIAGNOSIS — Z0182 Encounter for allergy testing: Secondary | ICD-10-CM | POA: Diagnosis not present

## 2021-07-18 DIAGNOSIS — E1140 Type 2 diabetes mellitus with diabetic neuropathy, unspecified: Secondary | ICD-10-CM | POA: Diagnosis not present

## 2021-07-18 DIAGNOSIS — E785 Hyperlipidemia, unspecified: Secondary | ICD-10-CM | POA: Diagnosis not present

## 2021-07-18 DIAGNOSIS — I739 Peripheral vascular disease, unspecified: Secondary | ICD-10-CM | POA: Diagnosis not present

## 2021-07-18 DIAGNOSIS — Z01818 Encounter for other preprocedural examination: Secondary | ICD-10-CM | POA: Diagnosis not present

## 2021-07-18 DIAGNOSIS — I2510 Atherosclerotic heart disease of native coronary artery without angina pectoris: Secondary | ICD-10-CM | POA: Diagnosis not present

## 2021-07-21 DIAGNOSIS — M5442 Lumbago with sciatica, left side: Secondary | ICD-10-CM | POA: Diagnosis not present

## 2021-07-21 DIAGNOSIS — M5416 Radiculopathy, lumbar region: Secondary | ICD-10-CM | POA: Insufficient documentation

## 2021-07-21 DIAGNOSIS — M7918 Myalgia, other site: Secondary | ICD-10-CM | POA: Diagnosis not present

## 2021-07-21 DIAGNOSIS — M5441 Lumbago with sciatica, right side: Secondary | ICD-10-CM | POA: Diagnosis not present

## 2021-07-21 DIAGNOSIS — E1140 Type 2 diabetes mellitus with diabetic neuropathy, unspecified: Secondary | ICD-10-CM | POA: Diagnosis not present

## 2021-07-21 DIAGNOSIS — G8929 Other chronic pain: Secondary | ICD-10-CM | POA: Diagnosis not present

## 2021-07-24 DIAGNOSIS — E119 Type 2 diabetes mellitus without complications: Secondary | ICD-10-CM | POA: Diagnosis not present

## 2021-07-24 DIAGNOSIS — E1140 Type 2 diabetes mellitus with diabetic neuropathy, unspecified: Secondary | ICD-10-CM | POA: Diagnosis not present

## 2021-07-24 DIAGNOSIS — G4733 Obstructive sleep apnea (adult) (pediatric): Secondary | ICD-10-CM | POA: Diagnosis not present

## 2021-07-25 DIAGNOSIS — G4733 Obstructive sleep apnea (adult) (pediatric): Secondary | ICD-10-CM | POA: Diagnosis not present

## 2021-08-10 DIAGNOSIS — M1712 Unilateral primary osteoarthritis, left knee: Secondary | ICD-10-CM | POA: Diagnosis not present

## 2021-08-16 DIAGNOSIS — Z01818 Encounter for other preprocedural examination: Secondary | ICD-10-CM | POA: Diagnosis not present

## 2021-08-16 DIAGNOSIS — M1712 Unilateral primary osteoarthritis, left knee: Secondary | ICD-10-CM | POA: Diagnosis not present

## 2021-08-24 DIAGNOSIS — E119 Type 2 diabetes mellitus without complications: Secondary | ICD-10-CM | POA: Diagnosis not present

## 2021-08-24 DIAGNOSIS — E1140 Type 2 diabetes mellitus with diabetic neuropathy, unspecified: Secondary | ICD-10-CM | POA: Diagnosis not present

## 2021-08-26 DIAGNOSIS — E1140 Type 2 diabetes mellitus with diabetic neuropathy, unspecified: Secondary | ICD-10-CM | POA: Diagnosis not present

## 2021-08-26 DIAGNOSIS — E1165 Type 2 diabetes mellitus with hyperglycemia: Secondary | ICD-10-CM | POA: Diagnosis not present

## 2021-08-26 DIAGNOSIS — E119 Type 2 diabetes mellitus without complications: Secondary | ICD-10-CM | POA: Diagnosis not present

## 2021-09-11 ENCOUNTER — Other Ambulatory Visit: Payer: Self-pay

## 2021-09-11 ENCOUNTER — Encounter: Payer: Self-pay | Admitting: Orthopedic Surgery

## 2021-09-11 ENCOUNTER — Ambulatory Visit: Payer: Medicare HMO | Admitting: Orthopedic Surgery

## 2021-09-11 DIAGNOSIS — Z9641 Presence of insulin pump (external) (internal): Secondary | ICD-10-CM | POA: Diagnosis not present

## 2021-09-11 DIAGNOSIS — I10 Essential (primary) hypertension: Secondary | ICD-10-CM | POA: Diagnosis not present

## 2021-09-11 DIAGNOSIS — E1140 Type 2 diabetes mellitus with diabetic neuropathy, unspecified: Secondary | ICD-10-CM | POA: Diagnosis not present

## 2021-09-11 DIAGNOSIS — Z89421 Acquired absence of other right toe(s): Secondary | ICD-10-CM

## 2021-09-11 DIAGNOSIS — E7800 Pure hypercholesterolemia, unspecified: Secondary | ICD-10-CM | POA: Diagnosis not present

## 2021-09-11 DIAGNOSIS — L97521 Non-pressure chronic ulcer of other part of left foot limited to breakdown of skin: Secondary | ICD-10-CM | POA: Diagnosis not present

## 2021-09-11 DIAGNOSIS — E1165 Type 2 diabetes mellitus with hyperglycemia: Secondary | ICD-10-CM | POA: Diagnosis not present

## 2021-09-11 NOTE — Progress Notes (Signed)
Office Visit Note   Patient: Grant [name]           Date of Birth: 07-Oct-1971           MRN: [id number] Visit Date: 09/11/2021              Requested by: [name], MD [address] [place],  [place] [address] PCP: [name], MD  Chief Complaint  Patient presents with   Left Foot - Pain      HPI: Patient is a 50 year old gentleman who is seen for evaluation for ulceration plantar aspect left foot.  Patient states he has been treating this with Eucerin.  He has no complaints with his fifth ray amputation on the right.  Patient states that he is scheduled for a total knee arthroplasty with Dr. Lorre Nick.  Assessment & Plan: Visit Diagnoses:  1. History of complete ray amputation of fifth toe of right foot ([place])   2. Non-pressure chronic ulcer of other part of left foot limited to breakdown of skin ([place])     Plan: Patient has no signs of infection of the left foot.  With the varus alignment of the left knee the total knee arthroplasty will most likely unload the lateral column of the left foot and allow the dry cracked skin to heal.  Follow-Up Instructions: Return in about 2 months (around 11/11/2021).   Ortho Exam  Patient is alert, oriented, no adenopathy, well-dressed, normal affect, normal respiratory effort. Examination patient has a cavovarus foot on the left he is currently wearing sole orthotics he has dorsiflexion to neutral there is dry cracked skin with a fissure over the plantar aspect of the fifth metatarsal head.  A 10 blade knife was used to pare the callus the area of skin breakdown was 2 cm in length and 3 mm in width with healthy granulation tissue no signs of infection no exposed bone or tendon.  Patient's last recorded hemoglobin A1c in the chart is 9.4.  Imaging: No results found. No images are attached to the encounter.  Labs: Lab Results  Component Value Date   HGBA1C 9.4 (H) 06/09/2018   ESRSEDRATE 2 11/30/2015   REPTSTATUS 12/12/2020  FINAL 12/07/2020   GRAMSTAIN NO WBC SEEN NO ORGANISMS SEEN  12/07/2020   CULT  12/07/2020    No growth aerobically or anaerobically. Performed at [facility], [place] [address], [place], [place] [address]      Lab Results  Component Value Date   ALBUMIN 4.5 08/01/2018   ALBUMIN 3.7 07/28/2018   ALBUMIN 4.2 07/27/2018    Lab Results  Component Value Date   MG 2.0 07/30/2018   MG 2.0 07/28/2018   MG 1.8 07/27/2018   No results found for: VD25OH  No results found for: PREALBUMIN CBC EXTENDED Latest Ref Rng & Units 12/07/2020 08/01/2018 07/30/2018  WBC 4.0 - 10.5 K/uL 11.8(H) 12.6(H) 8.1  RBC 4.22 - 5.81 MIL/uL 5.05 5.23 4.44  HGB 13.0 - 17.0 g/dL 14.5 15.9 13.2  HCT 39.0 - 52.0 % 42.7 43.8 38.4(L)  PLT 150 - 400 K/uL 254 222 175  NEUTROABS 1.7 - 7.7 K/uL - 10.0(H) -  LYMPHSABS 0.7 - 4.0 K/uL - 1.5 -     There is no height or weight on file to calculate BMI.  Orders:  No orders of the defined types were placed in this encounter.  No orders of the defined types were placed in this encounter.    Procedures: No procedures performed  Clinical Data: No additional findings.  ROS:  All other systems negative, except as noted in the HPI. Review of Systems  Objective: Vital Signs: There were no vitals taken for this visit.  Specialty Comments:  No specialty comments available.  PMFS History: Patient Active Problem List   Diagnosis Date Noted   Osteomyelitis of fifth toe of right foot ([place])    Alcohol use disorder, severe, dependence ([place]) 04/16/2019   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[place]) 04/16/2019   Cannabis hyperemesis syndrome concurrent with and due to cannabis abuse ([place]) 08/01/2018   Gastroesophageal reflux disease 07/17/2018   Long QT interval 07/16/2018   Early satiety    Esophagitis, [place] grade D    Hypercalcemia 06/09/2018   SIRS (systemic inflammatory response syndrome) ([place]) 06/09/2018    Hematemesis 06/09/2018   Type 2 diabetes mellitus with peripheral neuropathy ([place]) 06/09/2018   Closed displaced fracture of first metatarsal bone of left foot 07/31/2017   Acidosis 09/17/2016   Dehydration 09/17/2016   Abdominal pain 09/17/2016   DKA (diabetic ketoacidoses) 09/17/2016   Nausea and vomiting 09/17/2016   IDDM (insulin dependent diabetes mellitus)    Gastroparesis    Diabetic peripheral neuropathy ([place]) 11/30/2015   Chronic low back pain 11/30/2015   Coronary artery spasm ([place]) 08/17/2013   Hyperlipidemia 08/17/2013   Peripheral neuropathy 08/05/2013   Chest pain 08/05/2013   Fatigue 08/05/2013   Tachycardia 08/05/2013   DOE (dyspnea on exertion) 08/05/2013   Diabetes mellitus, insulin dependent (IDDM), uncontrolled 02/09/2008   Anxiety state 02/09/2008   Essential hypertension 02/09/2008   ALCOHOL ABUSE, HX OF 02/09/2008   LIVER FUNCTION TESTS, ABNORMAL, HX OF 02/09/2008   NEOPLASM, BENIGN, ESOPHAGUS 09/16/2007   Gastritis 09/16/2007   Past Medical History:  Diagnosis Date   Anxiety    Chronic low back pain 11/30/2015   Depression    Diabetic peripheral neuropathy ([place]) 11/30/2015   Hypertension    Neuropathy    Type 2 diabetes mellitus ([place])     Family History  Problem Relation Age of Onset   Diabetes Mother    Hypertension Mother    Hypertension Father    Hyperlipidemia Father    Alcohol abuse Brother    Brain cancer Other        grandparent   Stomach cancer Other        grandparent   Stomach cancer Paternal Grandfather    Colon cancer Neg Hx    Esophageal cancer Neg Hx    Rectal cancer Neg Hx     Past Surgical History:  Procedure Laterality Date   AMPUTATION Right 12/07/2020   Procedure: RIGHT FOOT 5TH RAY AMPUTATION;  Surgeon: [name], MD;  Location: [facility];  Service: Orthopedics;  Laterality: Right;   back lipoma resection  03/2006   BIOPSY  06/15/2018   Procedure: BIOPSY;  Surgeon: Jerene Bears, MD;  Location: [facility];  Service:  Gastroenterology;;   CARDIAC CATHETERIZATION     ESOPHAGOGASTRODUODENOSCOPY (EGD) WITH PROPOFOL N/A 06/15/2018   Procedure: ESOPHAGOGASTRODUODENOSCOPY (EGD) WITH PROPOFOL;  Surgeon: Jerene Bears, MD;  Location: [facility] ENDOSCOPY;  Service: Gastroenterology;  Laterality: N/A;   KNEE ARTHROSCOPY Left 10/2008   Meniscus Repair   KNEE ARTHROSCOPY W/ ACL RECONSTRUCTION Left 1998   LEFT HEART CATHETERIZATION WITH CORONARY ANGIOGRAM N/A 08/11/2013   Procedure: LEFT HEART CATHETERIZATION WITH CORONARY ANGIOGRAM;  Surgeon: Pixie Casino, MD;  Location: [facility] CATH LAB;  Service: Cardiovascular;  Laterality: N/A;   LOWER  EXTREMITY ANGIOGRAPHY Right 11/10/2020   Procedure: LOWER EXTREMITY ANGIOGRAPHY;  Surgeon: Algernon Huxley, MD;  Location: [facility] CV LAB;  Service: Cardiovascular;  Laterality: Right;   MENISCUS REPAIR Left 2001   UPPER GI ENDOSCOPY  10/2007   showed gastritis   Social History   Occupational History   Occupation: disability  Tobacco Use   Smoking status: Former    Packs/day: 0.50    Years: 20.00    Pack years: 10.00    Types: Cigarettes    Quit date: 03/23/2016    Years since quitting: 5.4   Smokeless tobacco: Current    Types: Chew   Tobacco comments:     chews daily  Vaping Use   Vaping Use: Never used  Substance and Sexual Activity   Alcohol use: No    Comment: Recovered alcoholic   Drug use: No   Sexual activity: Not on file

## 2021-09-14 DIAGNOSIS — E1140 Type 2 diabetes mellitus with diabetic neuropathy, unspecified: Secondary | ICD-10-CM | POA: Diagnosis not present

## 2021-09-14 DIAGNOSIS — E119 Type 2 diabetes mellitus without complications: Secondary | ICD-10-CM | POA: Diagnosis not present

## 2021-09-14 DIAGNOSIS — E1165 Type 2 diabetes mellitus with hyperglycemia: Secondary | ICD-10-CM | POA: Diagnosis not present

## 2021-09-18 DIAGNOSIS — G8918 Other acute postprocedural pain: Secondary | ICD-10-CM | POA: Diagnosis not present

## 2021-09-18 DIAGNOSIS — M1712 Unilateral primary osteoarthritis, left knee: Secondary | ICD-10-CM | POA: Diagnosis not present

## 2021-09-18 DIAGNOSIS — Z96652 Presence of left artificial knee joint: Secondary | ICD-10-CM | POA: Diagnosis not present

## 2021-09-23 DIAGNOSIS — E119 Type 2 diabetes mellitus without complications: Secondary | ICD-10-CM | POA: Diagnosis not present

## 2021-09-23 DIAGNOSIS — E1140 Type 2 diabetes mellitus with diabetic neuropathy, unspecified: Secondary | ICD-10-CM | POA: Diagnosis not present

## 2021-09-25 DIAGNOSIS — M25662 Stiffness of left knee, not elsewhere classified: Secondary | ICD-10-CM | POA: Diagnosis not present

## 2021-09-25 DIAGNOSIS — R29898 Other symptoms and signs involving the musculoskeletal system: Secondary | ICD-10-CM | POA: Diagnosis not present

## 2021-09-25 DIAGNOSIS — M25462 Effusion, left knee: Secondary | ICD-10-CM | POA: Diagnosis not present

## 2021-09-25 DIAGNOSIS — M25562 Pain in left knee: Secondary | ICD-10-CM | POA: Diagnosis not present

## 2021-09-25 DIAGNOSIS — Z7409 Other reduced mobility: Secondary | ICD-10-CM | POA: Diagnosis not present

## 2021-09-28 DIAGNOSIS — R29898 Other symptoms and signs involving the musculoskeletal system: Secondary | ICD-10-CM | POA: Diagnosis not present

## 2021-09-28 DIAGNOSIS — M25562 Pain in left knee: Secondary | ICD-10-CM | POA: Diagnosis not present

## 2021-09-28 DIAGNOSIS — Z7409 Other reduced mobility: Secondary | ICD-10-CM | POA: Diagnosis not present

## 2021-09-28 DIAGNOSIS — M25662 Stiffness of left knee, not elsewhere classified: Secondary | ICD-10-CM | POA: Diagnosis not present

## 2021-09-28 DIAGNOSIS — Z96652 Presence of left artificial knee joint: Secondary | ICD-10-CM | POA: Diagnosis not present

## 2021-09-28 DIAGNOSIS — M25462 Effusion, left knee: Secondary | ICD-10-CM | POA: Diagnosis not present

## 2021-10-03 ENCOUNTER — Ambulatory Visit: Payer: Medicare HMO | Admitting: Family

## 2021-10-03 ENCOUNTER — Encounter: Payer: Self-pay | Admitting: Family

## 2021-10-03 DIAGNOSIS — M25562 Pain in left knee: Secondary | ICD-10-CM | POA: Diagnosis not present

## 2021-10-03 DIAGNOSIS — L97421 Non-pressure chronic ulcer of left heel and midfoot limited to breakdown of skin: Secondary | ICD-10-CM

## 2021-10-03 DIAGNOSIS — E1142 Type 2 diabetes mellitus with diabetic polyneuropathy: Secondary | ICD-10-CM

## 2021-10-03 DIAGNOSIS — M25462 Effusion, left knee: Secondary | ICD-10-CM | POA: Diagnosis not present

## 2021-10-03 DIAGNOSIS — Z7409 Other reduced mobility: Secondary | ICD-10-CM | POA: Diagnosis not present

## 2021-10-03 DIAGNOSIS — M25662 Stiffness of left knee, not elsewhere classified: Secondary | ICD-10-CM | POA: Diagnosis not present

## 2021-10-03 DIAGNOSIS — R29898 Other symptoms and signs involving the musculoskeletal system: Secondary | ICD-10-CM | POA: Diagnosis not present

## 2021-10-03 NOTE — Progress Notes (Signed)
Office Visit Note   Patient: Grant [name]           Date of Birth: [birth year]           MRN: [id number] Visit Date: 10/03/2021              Requested by: [name], MD [address] [place],  [place] [address] PCP: [name], MD  Chief Complaint  Patient presents with   Left Foot - Wound Check      HPI: Patient is a 50 year old gentleman who presents today in follow-up for ulceration beneath his left foot.  He has been treating this with Eucerin.  He feels that since he has been laid up after his total knee arthroplasty things have improved greatly  Assessment & Plan: Visit Diagnoses: No diagnosis found.  Plan: Continue with moisturizing daily foot checks and cleaning.  He will follow-up as needed.  Discussed heel cord stretching as well as the option of cutting out his orthotic beneath the first metatarsal head.  He would like to hold on altering his orthotic today.  Follow-Up Instructions: Return if symptoms worsen or fail to improve.   Ortho Exam  Patient is alert, oriented, no adenopathy, well-dressed, normal affect, normal respiratory effort. On examination of the left foot.  He has a cavovarus foot on the left he does have dorsiflexion to neutral with some dry callused skin.  The fissure has healed this was debrided of surrounding nonviable tissue with a 10 blade knife back to viable tissue.  There is no erythema no warmth no drainage  Imaging: No results found. No images are attached to the encounter.  Labs: Lab Results  Component Value Date   HGBA1C 9.4 (H) 06/09/2018   ESRSEDRATE 2 11/30/2015   REPTSTATUS 12/12/2020 FINAL 12/07/2020   GRAMSTAIN NO WBC SEEN NO ORGANISMS SEEN  12/07/2020   CULT  12/07/2020    No growth aerobically or anaerobically. Performed at [facility], [place] [address], [place], [place] [address]      Lab Results  Component Value Date   ALBUMIN 4.5 08/01/2018   ALBUMIN 3.7 07/28/2018   ALBUMIN 4.2 07/27/2018     Lab Results  Component Value Date   MG 2.0 07/30/2018   MG 2.0 07/28/2018   MG 1.8 07/27/2018   No results found for: VD25OH  No results found for: PREALBUMIN CBC EXTENDED Latest Ref Rng & Units 12/07/2020 08/01/2018 07/30/2018  WBC 4.0 - 10.5 K/uL 11.8(H) 12.6(H) 8.1  RBC 4.22 - 5.81 MIL/uL 5.05 5.23 4.44  HGB 13.0 - 17.0 g/dL 14.5 15.9 13.2  HCT 39.0 - 52.0 % 42.7 43.8 38.4(L)  PLT 150 - 400 K/uL 254 222 175  NEUTROABS 1.7 - 7.7 K/uL - 10.0(H) -  LYMPHSABS 0.7 - 4.0 K/uL - 1.5 -     There is no height or weight on file to calculate BMI.  Orders:  No orders of the defined types were placed in this encounter.  No orders of the defined types were placed in this encounter.    Procedures: No procedures performed  Clinical Data: No additional findings.  ROS:  All other systems negative, except as noted in the HPI. Review of Systems  Constitutional:  Negative for chills and fever.  Skin:  Negative for color change and wound.   Objective: Vital Signs: There were no vitals taken for this visit.  Specialty Comments:  No specialty comments available.  PMFS History: Patient Active Problem List   Diagnosis  Date Noted   Osteomyelitis of fifth toe of right foot ([place])    Alcohol use disorder, severe, dependence ([place]) 04/16/2019   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[place]) 04/16/2019   Cannabis hyperemesis syndrome concurrent with and due to cannabis abuse ([place]) 08/01/2018   Gastroesophageal reflux disease 07/17/2018   Long QT interval 07/16/2018   Early satiety    Esophagitis, [place] grade D    Hypercalcemia 06/09/2018   SIRS (systemic inflammatory response syndrome) ([place]) 06/09/2018   Hematemesis 06/09/2018   Type 2 diabetes mellitus with peripheral neuropathy ([place]) 06/09/2018   Closed displaced fracture of first metatarsal bone of left foot 07/31/2017   Acidosis 09/17/2016   Dehydration 09/17/2016   Abdominal pain  09/17/2016   DKA (diabetic ketoacidoses) 09/17/2016   Nausea and vomiting 09/17/2016   IDDM (insulin dependent diabetes mellitus)    Gastroparesis    Diabetic peripheral neuropathy ([place]) 11/30/2015   Chronic low back pain 11/30/2015   Coronary artery spasm ([place]) 08/17/2013   Hyperlipidemia 08/17/2013   Peripheral neuropathy 08/05/2013   Chest pain 08/05/2013   Fatigue 08/05/2013   Tachycardia 08/05/2013   DOE (dyspnea on exertion) 08/05/2013   Diabetes mellitus, insulin dependent (IDDM), uncontrolled 02/09/2008   Anxiety state 02/09/2008   Essential hypertension 02/09/2008   ALCOHOL ABUSE, HX OF 02/09/2008   LIVER FUNCTION TESTS, ABNORMAL, HX OF 02/09/2008   NEOPLASM, BENIGN, ESOPHAGUS 09/16/2007   Gastritis 09/16/2007   Past Medical History:  Diagnosis Date   Anxiety    Chronic low back pain 11/30/2015   Depression    Diabetic peripheral neuropathy ([place]) 11/30/2015   Hypertension    Neuropathy    Type 2 diabetes mellitus ([place])     Family History  Problem Relation Age of Onset   Diabetes Mother    Hypertension Mother    Hypertension Father    Hyperlipidemia Father    Alcohol abuse Brother    Brain cancer Other        grandparent   Stomach cancer Other        grandparent   Stomach cancer Paternal Grandfather    Colon cancer Neg Hx    Esophageal cancer Neg Hx    Rectal cancer Neg Hx     Past Surgical History:  Procedure Laterality Date   AMPUTATION Right 12/07/2020   Procedure: RIGHT FOOT 5TH RAY AMPUTATION;  Surgeon: [name], MD;  Location: [place];  Service: Orthopedics;  Laterality: Right;   back lipoma resection  03/2006   BIOPSY  06/15/2018   Procedure: BIOPSY;  Surgeon: Jerene Bears, MD;  Location: [facility] ENDOSCOPY;  Service: Gastroenterology;;   CARDIAC CATHETERIZATION     ESOPHAGOGASTRODUODENOSCOPY (EGD) WITH PROPOFOL N/A 06/15/2018   Procedure: ESOPHAGOGASTRODUODENOSCOPY (EGD) WITH PROPOFOL;  Surgeon: Jerene Bears, MD;  Location: [facility] ENDOSCOPY;  Service:  Gastroenterology;  Laterality: N/A;   KNEE ARTHROSCOPY Left 10/2008   Meniscus Repair   KNEE ARTHROSCOPY W/ ACL RECONSTRUCTION Left 1998   LEFT HEART CATHETERIZATION WITH CORONARY ANGIOGRAM N/A 08/11/2013   Procedure: LEFT HEART CATHETERIZATION WITH CORONARY ANGIOGRAM;  Surgeon: Pixie Casino, MD;  Location: [facility] CATH LAB;  Service: Cardiovascular;  Laterality: N/A;   LOWER EXTREMITY ANGIOGRAPHY Right 11/10/2020   Procedure: LOWER EXTREMITY ANGIOGRAPHY;  Surgeon: Algernon Huxley, MD;  Location: [place] CV LAB;  Service: Cardiovascular;  Laterality: Right;   MENISCUS REPAIR Left 2001   UPPER GI ENDOSCOPY  10/2007   showed gastritis   Social History  Occupational History   Occupation: disability  Tobacco Use   Smoking status: Former    Packs/day: 0.50    Years: 20.00    Pack years: 10.00    Types: Cigarettes    Quit date: 03/23/2016    Years since quitting: 5.5   Smokeless tobacco: Current    Types: Chew   Tobacco comments:     chews daily  Vaping Use   Vaping Use: Never used  Substance and Sexual Activity   Alcohol use: No    Comment: Recovered alcoholic   Drug use: No   Sexual activity: Not on file

## 2021-10-05 DIAGNOSIS — M25462 Effusion, left knee: Secondary | ICD-10-CM | POA: Diagnosis not present

## 2021-10-05 DIAGNOSIS — G8929 Other chronic pain: Secondary | ICD-10-CM | POA: Diagnosis not present

## 2021-10-05 DIAGNOSIS — Z7409 Other reduced mobility: Secondary | ICD-10-CM | POA: Diagnosis not present

## 2021-10-05 DIAGNOSIS — M25562 Pain in left knee: Secondary | ICD-10-CM | POA: Diagnosis not present

## 2021-10-05 DIAGNOSIS — M25662 Stiffness of left knee, not elsewhere classified: Secondary | ICD-10-CM | POA: Diagnosis not present

## 2021-10-10 DIAGNOSIS — Z7409 Other reduced mobility: Secondary | ICD-10-CM | POA: Diagnosis not present

## 2021-10-10 DIAGNOSIS — M25462 Effusion, left knee: Secondary | ICD-10-CM | POA: Diagnosis not present

## 2021-10-10 DIAGNOSIS — M25562 Pain in left knee: Secondary | ICD-10-CM | POA: Diagnosis not present

## 2021-10-10 DIAGNOSIS — G8929 Other chronic pain: Secondary | ICD-10-CM | POA: Diagnosis not present

## 2021-10-10 DIAGNOSIS — M25662 Stiffness of left knee, not elsewhere classified: Secondary | ICD-10-CM | POA: Diagnosis not present

## 2021-10-10 DIAGNOSIS — R29898 Other symptoms and signs involving the musculoskeletal system: Secondary | ICD-10-CM | POA: Diagnosis not present

## 2021-10-12 DIAGNOSIS — M25662 Stiffness of left knee, not elsewhere classified: Secondary | ICD-10-CM | POA: Diagnosis not present

## 2021-10-12 DIAGNOSIS — M25462 Effusion, left knee: Secondary | ICD-10-CM | POA: Diagnosis not present

## 2021-10-12 DIAGNOSIS — M25562 Pain in left knee: Secondary | ICD-10-CM | POA: Diagnosis not present

## 2021-10-12 DIAGNOSIS — R29898 Other symptoms and signs involving the musculoskeletal system: Secondary | ICD-10-CM | POA: Diagnosis not present

## 2021-10-12 DIAGNOSIS — Z7409 Other reduced mobility: Secondary | ICD-10-CM | POA: Diagnosis not present

## 2021-10-13 DIAGNOSIS — E1140 Type 2 diabetes mellitus with diabetic neuropathy, unspecified: Secondary | ICD-10-CM | POA: Diagnosis not present

## 2021-10-13 DIAGNOSIS — M5416 Radiculopathy, lumbar region: Secondary | ICD-10-CM | POA: Diagnosis not present

## 2021-10-13 DIAGNOSIS — M7918 Myalgia, other site: Secondary | ICD-10-CM | POA: Diagnosis not present

## 2021-10-13 DIAGNOSIS — M5441 Lumbago with sciatica, right side: Secondary | ICD-10-CM | POA: Diagnosis not present

## 2021-10-13 DIAGNOSIS — G8929 Other chronic pain: Secondary | ICD-10-CM | POA: Diagnosis not present

## 2021-10-13 DIAGNOSIS — M5442 Lumbago with sciatica, left side: Secondary | ICD-10-CM | POA: Diagnosis not present

## 2021-10-16 DIAGNOSIS — G8929 Other chronic pain: Secondary | ICD-10-CM | POA: Diagnosis not present

## 2021-10-16 DIAGNOSIS — R29898 Other symptoms and signs involving the musculoskeletal system: Secondary | ICD-10-CM | POA: Diagnosis not present

## 2021-10-16 DIAGNOSIS — M25462 Effusion, left knee: Secondary | ICD-10-CM | POA: Diagnosis not present

## 2021-10-16 DIAGNOSIS — M25662 Stiffness of left knee, not elsewhere classified: Secondary | ICD-10-CM | POA: Diagnosis not present

## 2021-10-16 DIAGNOSIS — M25562 Pain in left knee: Secondary | ICD-10-CM | POA: Diagnosis not present

## 2021-10-16 DIAGNOSIS — Z7409 Other reduced mobility: Secondary | ICD-10-CM | POA: Diagnosis not present

## 2021-10-23 DIAGNOSIS — R29898 Other symptoms and signs involving the musculoskeletal system: Secondary | ICD-10-CM | POA: Diagnosis not present

## 2021-10-23 DIAGNOSIS — M25462 Effusion, left knee: Secondary | ICD-10-CM | POA: Diagnosis not present

## 2021-10-23 DIAGNOSIS — M25662 Stiffness of left knee, not elsewhere classified: Secondary | ICD-10-CM | POA: Diagnosis not present

## 2021-10-23 DIAGNOSIS — M25562 Pain in left knee: Secondary | ICD-10-CM | POA: Diagnosis not present

## 2021-10-23 DIAGNOSIS — Z7409 Other reduced mobility: Secondary | ICD-10-CM | POA: Diagnosis not present

## 2021-10-24 DIAGNOSIS — I10 Essential (primary) hypertension: Secondary | ICD-10-CM | POA: Diagnosis not present

## 2021-10-24 DIAGNOSIS — E1140 Type 2 diabetes mellitus with diabetic neuropathy, unspecified: Secondary | ICD-10-CM | POA: Diagnosis not present

## 2021-10-24 DIAGNOSIS — E785 Hyperlipidemia, unspecified: Secondary | ICD-10-CM | POA: Diagnosis not present

## 2021-10-24 DIAGNOSIS — Z125 Encounter for screening for malignant neoplasm of prostate: Secondary | ICD-10-CM | POA: Diagnosis not present

## 2021-10-25 DIAGNOSIS — M25662 Stiffness of left knee, not elsewhere classified: Secondary | ICD-10-CM | POA: Diagnosis not present

## 2021-10-25 DIAGNOSIS — R29898 Other symptoms and signs involving the musculoskeletal system: Secondary | ICD-10-CM | POA: Diagnosis not present

## 2021-10-25 DIAGNOSIS — Z7409 Other reduced mobility: Secondary | ICD-10-CM | POA: Diagnosis not present

## 2021-10-25 DIAGNOSIS — M25562 Pain in left knee: Secondary | ICD-10-CM | POA: Diagnosis not present

## 2021-10-25 DIAGNOSIS — M25462 Effusion, left knee: Secondary | ICD-10-CM | POA: Diagnosis not present

## 2021-10-31 DIAGNOSIS — E1140 Type 2 diabetes mellitus with diabetic neuropathy, unspecified: Secondary | ICD-10-CM | POA: Diagnosis not present

## 2021-10-31 DIAGNOSIS — Z794 Long term (current) use of insulin: Secondary | ICD-10-CM | POA: Diagnosis not present

## 2021-10-31 DIAGNOSIS — I739 Peripheral vascular disease, unspecified: Secondary | ICD-10-CM | POA: Diagnosis not present

## 2021-10-31 DIAGNOSIS — I10 Essential (primary) hypertension: Secondary | ICD-10-CM | POA: Diagnosis not present

## 2021-10-31 DIAGNOSIS — Z0000 Encounter for general adult medical examination without abnormal findings: Secondary | ICD-10-CM | POA: Diagnosis not present

## 2021-10-31 DIAGNOSIS — R82998 Other abnormal findings in urine: Secondary | ICD-10-CM | POA: Diagnosis not present

## 2021-10-31 DIAGNOSIS — Z23 Encounter for immunization: Secondary | ICD-10-CM | POA: Diagnosis not present

## 2021-10-31 DIAGNOSIS — M25462 Effusion, left knee: Secondary | ICD-10-CM | POA: Diagnosis not present

## 2021-10-31 DIAGNOSIS — G629 Polyneuropathy, unspecified: Secondary | ICD-10-CM | POA: Diagnosis not present

## 2021-10-31 DIAGNOSIS — R29898 Other symptoms and signs involving the musculoskeletal system: Secondary | ICD-10-CM | POA: Diagnosis not present

## 2021-10-31 DIAGNOSIS — Z7409 Other reduced mobility: Secondary | ICD-10-CM | POA: Diagnosis not present

## 2021-10-31 DIAGNOSIS — I2510 Atherosclerotic heart disease of native coronary artery without angina pectoris: Secondary | ICD-10-CM | POA: Diagnosis not present

## 2021-10-31 DIAGNOSIS — Z96652 Presence of left artificial knee joint: Secondary | ICD-10-CM | POA: Diagnosis not present

## 2021-10-31 DIAGNOSIS — M25662 Stiffness of left knee, not elsewhere classified: Secondary | ICD-10-CM | POA: Diagnosis not present

## 2021-10-31 DIAGNOSIS — E785 Hyperlipidemia, unspecified: Secondary | ICD-10-CM | POA: Diagnosis not present

## 2021-10-31 DIAGNOSIS — M25562 Pain in left knee: Secondary | ICD-10-CM | POA: Diagnosis not present

## 2021-11-08 DIAGNOSIS — Z96652 Presence of left artificial knee joint: Secondary | ICD-10-CM | POA: Diagnosis not present

## 2021-11-08 DIAGNOSIS — M25662 Stiffness of left knee, not elsewhere classified: Secondary | ICD-10-CM | POA: Diagnosis not present

## 2021-11-08 DIAGNOSIS — M25462 Effusion, left knee: Secondary | ICD-10-CM | POA: Diagnosis not present

## 2021-11-08 DIAGNOSIS — R29898 Other symptoms and signs involving the musculoskeletal system: Secondary | ICD-10-CM | POA: Diagnosis not present

## 2021-11-08 DIAGNOSIS — M25562 Pain in left knee: Secondary | ICD-10-CM | POA: Diagnosis not present

## 2021-11-08 DIAGNOSIS — Z7409 Other reduced mobility: Secondary | ICD-10-CM | POA: Diagnosis not present

## 2021-11-13 ENCOUNTER — Ambulatory Visit: Payer: Medicare HMO | Admitting: Orthopedic Surgery

## 2021-12-08 DIAGNOSIS — E1165 Type 2 diabetes mellitus with hyperglycemia: Secondary | ICD-10-CM | POA: Diagnosis not present

## 2021-12-08 DIAGNOSIS — E119 Type 2 diabetes mellitus without complications: Secondary | ICD-10-CM | POA: Diagnosis not present

## 2021-12-08 DIAGNOSIS — E1140 Type 2 diabetes mellitus with diabetic neuropathy, unspecified: Secondary | ICD-10-CM | POA: Diagnosis not present

## 2022-01-04 DIAGNOSIS — H47013 Ischemic optic neuropathy, bilateral: Secondary | ICD-10-CM | POA: Diagnosis not present

## 2022-01-04 DIAGNOSIS — H469 Unspecified optic neuritis: Secondary | ICD-10-CM | POA: Diagnosis not present

## 2022-01-04 DIAGNOSIS — H4710 Unspecified papilledema: Secondary | ICD-10-CM | POA: Diagnosis not present

## 2022-01-04 DIAGNOSIS — E1140 Type 2 diabetes mellitus with diabetic neuropathy, unspecified: Secondary | ICD-10-CM | POA: Diagnosis not present

## 2022-01-19 DIAGNOSIS — M5441 Lumbago with sciatica, right side: Secondary | ICD-10-CM | POA: Diagnosis not present

## 2022-01-19 DIAGNOSIS — M7918 Myalgia, other site: Secondary | ICD-10-CM | POA: Diagnosis not present

## 2022-01-19 DIAGNOSIS — G8929 Other chronic pain: Secondary | ICD-10-CM | POA: Diagnosis not present

## 2022-01-19 DIAGNOSIS — M5416 Radiculopathy, lumbar region: Secondary | ICD-10-CM | POA: Diagnosis not present

## 2022-01-19 DIAGNOSIS — E1140 Type 2 diabetes mellitus with diabetic neuropathy, unspecified: Secondary | ICD-10-CM | POA: Diagnosis not present

## 2022-01-19 DIAGNOSIS — M5442 Lumbago with sciatica, left side: Secondary | ICD-10-CM | POA: Diagnosis not present

## 2022-02-09 DIAGNOSIS — Z9641 Presence of insulin pump (external) (internal): Secondary | ICD-10-CM | POA: Diagnosis not present

## 2022-02-09 DIAGNOSIS — E1165 Type 2 diabetes mellitus with hyperglycemia: Secondary | ICD-10-CM | POA: Diagnosis not present

## 2022-02-09 DIAGNOSIS — I10 Essential (primary) hypertension: Secondary | ICD-10-CM | POA: Diagnosis not present

## 2022-02-09 DIAGNOSIS — E7800 Pure hypercholesterolemia, unspecified: Secondary | ICD-10-CM | POA: Diagnosis not present

## 2022-02-09 DIAGNOSIS — E1140 Type 2 diabetes mellitus with diabetic neuropathy, unspecified: Secondary | ICD-10-CM | POA: Diagnosis not present

## 2022-02-09 DIAGNOSIS — I739 Peripheral vascular disease, unspecified: Secondary | ICD-10-CM | POA: Diagnosis not present

## 2022-02-26 ENCOUNTER — Ambulatory Visit: Payer: Medicare HMO | Admitting: Gastroenterology

## 2022-02-26 ENCOUNTER — Encounter: Payer: Self-pay | Admitting: Physician Assistant

## 2022-02-26 DIAGNOSIS — E119 Type 2 diabetes mellitus without complications: Secondary | ICD-10-CM | POA: Diagnosis not present

## 2022-02-26 DIAGNOSIS — E1140 Type 2 diabetes mellitus with diabetic neuropathy, unspecified: Secondary | ICD-10-CM | POA: Diagnosis not present

## 2022-02-26 DIAGNOSIS — E1165 Type 2 diabetes mellitus with hyperglycemia: Secondary | ICD-10-CM | POA: Diagnosis not present

## 2022-03-02 DIAGNOSIS — S92154A Nondisplaced avulsion fracture (chip fracture) of right talus, initial encounter for closed fracture: Secondary | ICD-10-CM | POA: Diagnosis not present

## 2022-03-02 DIAGNOSIS — M79671 Pain in right foot: Secondary | ICD-10-CM | POA: Diagnosis not present

## 2022-03-08 DIAGNOSIS — G629 Polyneuropathy, unspecified: Secondary | ICD-10-CM | POA: Diagnosis not present

## 2022-03-08 DIAGNOSIS — H469 Unspecified optic neuritis: Secondary | ICD-10-CM | POA: Diagnosis not present

## 2022-03-08 DIAGNOSIS — I739 Peripheral vascular disease, unspecified: Secondary | ICD-10-CM | POA: Diagnosis not present

## 2022-03-08 DIAGNOSIS — Z794 Long term (current) use of insulin: Secondary | ICD-10-CM | POA: Diagnosis not present

## 2022-03-08 DIAGNOSIS — L989 Disorder of the skin and subcutaneous tissue, unspecified: Secondary | ICD-10-CM | POA: Diagnosis not present

## 2022-03-08 DIAGNOSIS — E1140 Type 2 diabetes mellitus with diabetic neuropathy, unspecified: Secondary | ICD-10-CM | POA: Diagnosis not present

## 2022-03-08 DIAGNOSIS — H47013 Ischemic optic neuropathy, bilateral: Secondary | ICD-10-CM | POA: Diagnosis not present

## 2022-03-08 DIAGNOSIS — I2510 Atherosclerotic heart disease of native coronary artery without angina pectoris: Secondary | ICD-10-CM | POA: Diagnosis not present

## 2022-03-08 DIAGNOSIS — I10 Essential (primary) hypertension: Secondary | ICD-10-CM | POA: Diagnosis not present

## 2022-03-08 DIAGNOSIS — E11621 Type 2 diabetes mellitus with foot ulcer: Secondary | ICD-10-CM | POA: Diagnosis not present

## 2022-03-08 DIAGNOSIS — E785 Hyperlipidemia, unspecified: Secondary | ICD-10-CM | POA: Diagnosis not present

## 2022-03-15 ENCOUNTER — Other Ambulatory Visit (INDEPENDENT_AMBULATORY_CARE_PROVIDER_SITE_OTHER): Payer: Self-pay | Admitting: Vascular Surgery

## 2022-03-15 ENCOUNTER — Encounter: Payer: Self-pay | Admitting: Physician Assistant

## 2022-03-15 ENCOUNTER — Ambulatory Visit (INDEPENDENT_AMBULATORY_CARE_PROVIDER_SITE_OTHER): Payer: Medicare HMO

## 2022-03-15 ENCOUNTER — Ambulatory Visit (INDEPENDENT_AMBULATORY_CARE_PROVIDER_SITE_OTHER): Payer: Medicare HMO | Admitting: Physician Assistant

## 2022-03-15 ENCOUNTER — Ambulatory Visit: Payer: Medicare HMO | Admitting: Orthopedic Surgery

## 2022-03-15 VITALS — BP 110/62 | HR 90 | Ht 71.5 in | Wt 264.0 lb

## 2022-03-15 DIAGNOSIS — Z1212 Encounter for screening for malignant neoplasm of rectum: Secondary | ICD-10-CM

## 2022-03-15 DIAGNOSIS — Z1211 Encounter for screening for malignant neoplasm of colon: Secondary | ICD-10-CM

## 2022-03-15 DIAGNOSIS — M79671 Pain in right foot: Secondary | ICD-10-CM | POA: Diagnosis not present

## 2022-03-15 NOTE — Progress Notes (Signed)
Office Visit Note   Patient: Grant [name]           Date of Birth: May 16, 1971           MRN: [id number] Visit Date: 03/15/2022              Requested by: Donnajean Lopes, MD [address] [place],  [place] [address] PCP: Donnajean Lopes, MD  Chief Complaint  Patient presents with   Left Foot - Pain   Right Foot - Pain    States injury 2 weeks ago fx foot and would like x-ray       HPI: The patient is a 51 year old gentleman who presents today for initial evaluation of a right foot injury.  Stumbled twisted his foot about 2 weeks prior has been having pain with weightbearing.  Of note his fifth ray is surgically absent.    Assessment & Plan: Visit Diagnoses:  1. Pain in right foot     Plan: Fourth metatarsal fracture.  Placed in a cam walker today he may weight-bear as tolerated in the cam walker plan to follow-up in 2 weeks with radiographs.  Follow-Up Instructions: No follow-ups on file.   Ortho Exam  Patient is alert, oriented, no adenopathy, well-dressed, normal affect, normal respiratory effort. On examination of the right foot the fifth ray is surgically absent.  No deformity.  The incision is well-healed he does have some mild edema over the dorsum of his foot with tenderness over the dorsum of his foot specifically over the fourth metatarsal.  There is a palpable dorsalis pedis pulse  Imaging: XR Foot 2 Views Right  Result Date: 03/15/2022 Radiographs of right foot show minimally displaced fracture of 4th metatarsal shaft with interval callus bridging. 5th ray is surgically absent.  No images are attached to the encounter.  Labs: Lab Results  Component Value Date   HGBA1C 9.4 (H) 06/09/2018   ESRSEDRATE 2 11/30/2015   REPTSTATUS 12/12/2020 FINAL 12/07/2020   GRAMSTAIN NO WBC SEEN NO ORGANISMS SEEN  12/07/2020   CULT  12/07/2020    No growth aerobically or anaerobically. Performed at [facility], [place] [address] [place], [place] [address]       Lab Results  Component Value Date   ALBUMIN 4.5 08/01/2018   ALBUMIN 3.7 07/28/2018   ALBUMIN 4.2 07/27/2018    Lab Results  Component Value Date   MG 2.0 07/30/2018   MG 2.0 07/28/2018   MG 1.8 07/27/2018   No results found for: VD25OH  No results found for: PREALBUMIN    Latest Ref Rng & Units 12/07/2020   10:27 AM 08/01/2018    7:49 AM 07/30/2018    4:00 AM  CBC EXTENDED  WBC 4.0 - 10.5 K/uL 11.8   12.6   8.1    RBC 4.22 - 5.81 MIL/uL 5.05   5.23   4.44    Hemoglobin 13.0 - 17.0 g/dL 14.5   15.9   13.2    HCT 39.0 - 52.0 % 42.7   43.8   38.4    Platelets 150 - 400 K/uL 254   222   175    NEUT# 1.7 - 7.7 K/uL  10.0     Lymph# 0.7 - 4.0 K/uL  1.5        There is no height or weight on file to calculate BMI.  Orders:  Orders Placed This Encounter  Procedures   XR Foot 2 Views Right   No orders  of the defined types were placed in this encounter.    Procedures: No procedures performed  Clinical Data: No additional findings.  ROS:  All other systems negative, except as noted in the HPI. Review of Systems  Objective: Vital Signs: There were no vitals taken for this visit.  Specialty Comments:  No specialty comments available.  PMFS History: Patient Active Problem List   Diagnosis Date Noted   Osteomyelitis of fifth toe of right foot ([place])    Alcohol use disorder, severe, dependence ([place]) 04/16/2019   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[place]) 04/16/2019   Cannabis hyperemesis syndrome concurrent with and due to cannabis abuse ([place]) 08/01/2018   Gastroesophageal reflux disease 07/17/2018   Long QT interval 07/16/2018   Early satiety    Esophagitis, [place] grade D    Hypercalcemia 06/09/2018   SIRS (systemic inflammatory response syndrome) ([place]) 06/09/2018   Hematemesis 06/09/2018   Type 2 diabetes mellitus with peripheral neuropathy ([place]) 06/09/2018   Closed displaced fracture of first metatarsal bone of  left foot 07/31/2017   Acidosis 09/17/2016   Dehydration 09/17/2016   Abdominal pain 09/17/2016   DKA (diabetic ketoacidoses) 09/17/2016   Nausea and vomiting 09/17/2016   IDDM (insulin dependent diabetes mellitus)    Gastroparesis    Diabetic peripheral neuropathy ([place]) 11/30/2015   Chronic low back pain 11/30/2015   Coronary artery spasm ([place]) 08/17/2013   Hyperlipidemia 08/17/2013   Peripheral neuropathy 08/05/2013   Chest pain 08/05/2013   Fatigue 08/05/2013   Tachycardia 08/05/2013   DOE (dyspnea on exertion) 08/05/2013   Diabetes mellitus, insulin dependent (IDDM), uncontrolled 02/09/2008   Anxiety state 02/09/2008   Essential hypertension 02/09/2008   ALCOHOL ABUSE, HX OF 02/09/2008   LIVER FUNCTION TESTS, ABNORMAL, HX OF 02/09/2008   NEOPLASM, BENIGN, ESOPHAGUS 09/16/2007   Gastritis 09/16/2007   Past Medical History:  Diagnosis Date   Alcohol abuse    Anxiety    Arteritic AION (anterior ischemic optic neuropathy)    Chronic low back pain 11/30/2015   Chronic pain    Depression    Diabetic peripheral neuropathy ([place]) 11/30/2015   Gastric ulcer    Hypertension    Lumbar radiculopathy    Neuropathy    Sleep apnea    Type 2 diabetes mellitus ([place])     Family History  Problem Relation Age of Onset   Diabetes Mother    Hypertension Mother    Hypertension Father    Hyperlipidemia Father    Throat cancer Father    Alcohol abuse Brother    Brain cancer Maternal Grandfather    Stomach cancer Paternal Grandfather    Colon cancer Neg Hx    Esophageal cancer Neg Hx    Rectal cancer Neg Hx     Past Surgical History:  Procedure Laterality Date   AMPUTATION Right 12/07/2020   Procedure: RIGHT FOOT 5TH RAY AMPUTATION;  Surgeon: [name], MD;  Location: [facility];  Service: Orthopedics;  Laterality: Right;   back lipoma resection  03/2006   BIOPSY  06/15/2018   Procedure: BIOPSY;  Surgeon: Jerene Bears, MD;  [facility]: [facility];  Service: Gastroenterology;;    CARDIAC CATHETERIZATION     ESOPHAGOGASTRODUODENOSCOPY (EGD) WITH PROPOFOL N/A 06/15/2018   Procedure: ESOPHAGOGASTRODUODENOSCOPY (EGD) WITH PROPOFOL;  Surgeon: Jerene Bears, MD;  Location: [facility]OSCOPY;  Service: Gastroenterology;  Laterality: N/A;   KNEE ARTHROSCOPY Left 10/2008   Meniscus Repair   KNEE ARTHROSCOPY W/ ACL RECONSTRUCTION Left 1998  LEFT HEART CATHETERIZATION WITH CORONARY ANGIOGRAM N/A 08/11/2013   Procedure: LEFT HEART CATHETERIZATION WITH CORONARY ANGIOGRAM;  Surgeon: Pixie Casino, MD;  [facility] CATH LAB;  Service: Cardiovascular;  Laterality: N/A;   LOWER EXTREMITY ANGIOGRAPHY Right 11/10/2020   Procedure: LOWER EXTREMITY ANGIOGRAPHY;  Surgeon: Algernon Huxley, MD;  Location: [place] [facility] CV LAB;  Service: Cardiovascular;  Laterality: Right;   MENISCUS REPAIR Left 2001   TOTAL KNEE ARTHROPLASTY Left    UPPER GI ENDOSCOPY  10/2007   showed gastritis   Social History   Occupational History   Occupation: disability  Tobacco Use   Smoking status: Former    Packs/day: 0.50    Years: 20.00    Pack years: 10.00    Types: Cigarettes    Quit date: 03/23/2016    Years since quitting: 5.9   Smokeless tobacco: Current    Types: Chew   Tobacco comments:     chews daily  Vaping Use   Vaping Use: Never used  Substance and Sexual Activity   Alcohol use: No    Comment: Recovered alcoholic   Drug use: No   Sexual activity: Not on file

## 2022-03-15 NOTE — Progress Notes (Signed)
? ?Chief Complaint: Discuss colonoscopy ? ?HPI: ?   Mr. Grant [name] is a 51 year old male with a past medical history as listed below including diabetic neuropathy, known to Dr. [name], who was referred to me by Donnajean Lopes, MD to discuss a colonoscopy. ?   09/22/2018 EGD was normal.  Continued on Pantoprazole and added Metoclopramide for suspected diabetic gastroparesis. ?   Today, the patient presents to clinic and tells me he is due for a screening colonoscopy.  Apparently he had what sounds like a Hemoccult test towards the end of last year which was negative.  Tells me all of his family have had colonoscopies and he wants to make sure that he is okay.  Denies any family history of colon cancer.  Also denies any current GI complaints.  Does tell me he has had trouble with his diabetes lately with neuropathy in his foot.  Currently wearing a boot on his right foot. ?   Denies any continued issues with his epigastrium or gastroparesis complaints.  These seem to have gotten better when he stopped drinking. ?   Patient does admit that he was an alcoholic up until 3 years ago, he was just 3 years sober recently.  Tells me he is trying to get his life in shape.  Recently started a healthy  diet in March and has lost around 18 pounds. ?   In regards to Plavix patient tells me he only took this for 6 months after repair of one of the arteries in his leg.  He is no longer taking this. ?   Denies fever, chills, weight loss or blood in his stool. ? ?Past Medical History:  ?Diagnosis Date  ? Anxiety   ? Arteritic AION (anterior ischemic optic neuropathy)   ? Chronic low back pain 11/30/2015  ? Chronic pain   ? Depression   ? Diabetic peripheral neuropathy ([place]) 11/30/2015  ? Gastric ulcer   ? Hypertension   ? Lumbar radiculopathy   ? Neuropathy   ? Type 2 diabetes mellitus ([place])   ? ? ?Past Surgical History:  ?Procedure Laterality Date  ? AMPUTATION Right 12/07/2020  ? Procedure: RIGHT FOOT 5TH RAY AMPUTATION;  Surgeon:  [name], MD;  Location: [facility];  Service: Orthopedics;  Laterality: Right;  ? back lipoma resection  03/2006  ? BIOPSY  06/15/2018  ? Procedure: BIOPSY;  Surgeon: Jerene Bears, MD;  Location: [facility] ENDOSCOPY;  Service: Gastroenterology;;  ? CARDIAC CATHETERIZATION    ? ESOPHAGOGASTRODUODENOSCOPY (EGD) WITH PROPOFOL N/A 06/15/2018  ? Procedure: ESOPHAGOGASTRODUODENOSCOPY (EGD) WITH PROPOFOL;  Surgeon: Jerene Bears, MD;  Location: [facility] ENDOSCOPY;  Service: Gastroenterology;  Laterality: N/A;  ? KNEE ARTHROSCOPY Left 10/2008  ? Meniscus Repair  ? KNEE ARTHROSCOPY W/ ACL RECONSTRUCTION Left 1998  ? LEFT HEART CATHETERIZATION WITH CORONARY ANGIOGRAM N/A 08/11/2013  ? Procedure: LEFT HEART CATHETERIZATION WITH CORONARY ANGIOGRAM;  Surgeon: Pixie Casino, MD;  Location[facility] CATH LAB;  Service: Cardiovascular;  Laterality: N/A;  ? LOWER EXTREMITY ANGIOGRAPHY Right 11/10/2020  ? Procedure: LOWER EXTREMITY ANGIOGRAPHY;  Surgeon: Algernon Huxley, MD;  Location: [place] CV LAB;  Service: Cardiovascular;  Laterality: Right;  ? MENISCUS REPAIR Left 2001  ? UPPER GI ENDOSCOPY  10/2007  ? showed gastritis  ? ? ?Current Outpatient Medications  ?Medication Sig Dispense Refill  ? aspirin EC 81 MG tablet Take 1 tablet (81 mg total) by mouth daily. (Patient taking differently: Take 81 mg by mouth at bedtime.) 150 tablet  2  ? atorvastatin (LIPITOR) 10 MG tablet     ? clopidogrel (PLAVIX) 75 MG tablet Take 1 tablet (75 mg total) by mouth daily. 30 tablet 11  ? ibuprofen (ADVIL,MOTRIN) 200 MG tablet Take 200 mg by mouth every 6 (six) hours as needed for moderate pain or mild pain.    ? Insulin Human (INSULIN PUMP) SOLN Inject into the skin as directed.    ? insulin NPH Human (HUMULIN N,NOVOLIN N) 100 UNIT/ML injection Inject 0.4-0.45 mLs (40-45 Units total) into the skin See admin instructions. Use 40 units every morning then use 45 units every evening (Patient not taking: Reported on 07/05/2021) 10 mL 0  ? insulin regular (NOVOLIN  R,HUMULIN R) 100 units/mL injection Inject 4-8 Units into the skin 3 (three) times daily before meals. Sliding scale (Patient not taking: Reported on 07/05/2021)    ? losartan-hydrochlorothiazide (HYZAAR) 100-12.5 MG tablet Take 1 tablet by mouth at bedtime.    ? metoprolol succinate (TOPROL-XL) 50 MG 24 hr tablet Take 50 mg by mouth at bedtime.    ? nitroGLYCERIN (NITRODUR - DOSED IN MG/24 HR) 0.2 mg/hr patch Place 1 patch (0.2 mg total) onto the skin daily. 30 patch 12  ? pentoxifylline (TRENTAL) 400 MG CR tablet Take 1 tablet (400 mg total) by mouth 3 (three) times daily with meals. 90 tablet 3  ? pregabalin (LYRICA) 100 MG capsule Take 100 mg by mouth 2 (two) times daily.    ? Semaglutide (OZEMPIC, 1 MG/DOSE, [place]) Inject 0.5 mg into the skin once a week. Monday (Patient not taking: Reported on 07/05/2021)    ? Skin Protectants, Misc. (EUCERIN) cream Apply topically as needed for dry skin. 454 g 0  ? sulfamethoxazole-trimethoprim (BACTRIM DS) 800-160 MG tablet Take 1 tablet by mouth 2 (two) times daily. (Patient not taking: Reported on 07/05/2021) 20 tablet 0  ? traZODone (DESYREL) 100 MG tablet Take 100 mg by mouth at bedtime.    ? zolpidem (AMBIEN) 10 MG tablet Take 10 mg by mouth at bedtime.    ? ?No current facility-administered medications for this visit.  ? ? ?Allergies as of 03/15/2022 - Review Complete 10/03/2021  ?Allergen Reaction Noted  ? Codeine Nausea And Vomiting 12/19/2018  ? Duloxetine  06/27/2021  ? Duloxetine hcl  06/27/2021  ? Escitalopram  06/27/2021  ? Glucophage [metformin]  06/27/2021  ? Metformin hcl Diarrhea and Nausea Only 06/07/2021  ? Ozempic (0.25 or 0.5 mg-dose) [semaglutide(0.25 or 0.'5mg'$ -dos)]  06/27/2021  ? Tylox [oxycodone-acetaminophen] Nausea And Vomiting 08/05/2013  ? ? ?Family History  ?Problem Relation Age of Onset  ? Diabetes Mother   ? Hypertension Mother   ? Hypertension Father   ? Hyperlipidemia Father   ? Alcohol abuse Brother   ? Brain cancer Other   ?     grandparent  ?  Stomach cancer Other   ?     grandparent  ? Stomach cancer Paternal Grandfather   ? Colon cancer Neg Hx   ? Esophageal cancer Neg Hx   ? Rectal cancer Neg Hx   ? ? ?Social History  ? ?Socioeconomic History  ? Marital status: Married  ?  Spouse name: Not on file  ? Number of children: 2  ? Years of education: college  ? Highest education level: Not on file  ?Occupational History  ? Occupation: disability  ?Tobacco Use  ? Smoking status: Former  ?  Packs/day: 0.50  ?  Years: 20.00  ?  Pack years: 10.00  ?  Types: Cigarettes  ?  Quit date: 03/23/2016  ?  Years since quitting: 5.9  ? Smokeless tobacco: Current  ?  Types: Chew  ? Tobacco comments:  ?   chews daily  ?Vaping Use  ? Vaping Use: Never used  ?Substance and Sexual Activity  ? Alcohol use: No  ?  Comment: Recovered alcoholic  ? Drug use: No  ? Sexual activity: Not on file  ?Other Topics Concern  ? Not on file  ?Social History Narrative  ? Patient drinks 1 cup of caffeine daily.  ? Patient is right handed.   ? ?Social Determinants of Health  ? ?Financial Resource Strain: Not on file  ?Food Insecurity: Not on file  ?Transportation Needs: Not on file  ?Physical Activity: Not on file  ?Stress: Not on file  ?Social Connections: Not on file  ?Intimate Partner Violence: Not on file  ? ? ?Review of Systems:    ?Constitutional: No weight loss, fever or chills ?Skin: No rash  ?Cardiovascular: No chest pain ?Respiratory: No SOB ?Gastrointestinal: See HPI and otherwise negative ?Genitourinary: No dysuria  ?Neurological: No headache, dizziness or syncope ?Musculoskeletal: No new muscle or joint pain ?Hematologic: No bleeding ?Psychiatric: No history of depression or anxiety ? ? Physical Exam:  ?Vital signs: ?BP 110/62 (BP Location: Left Arm, Patient Position: Sitting, Cuff Size: Normal)   Pulse 90   Ht 5' 11.5" (1.816 m) Comment: measured without shoes  Wt 264 lb (119.7 kg)   BMI 36.31 kg/m?   ? ?Constitutional:   Pleasant Caucasian male appears to be in NAD, Well  developed, Well nourished, alert and cooperative ?Head:  Normocephalic and atraumatic. ?Eyes:   PEERL, EOMI. No icterus. Conjunctiva pink. ?Ears:  Normal auditory acuity. ?Neck:  Supple ?Throat: Oral cavity and pharynx

## 2022-03-15 NOTE — Patient Instructions (Addendum)
If you are age 52 or older, your body mass index should be between 23-30. Your Body mass index is 36.31 kg/m?Marland Kitchen If this is out of the aforementioned range listed, please consider follow up with your Primary Care Provider. ? ?If you are age 66 or younger, your body mass index should be between 19-25. Your Body mass index is 36.31 kg/m?Marland Kitchen If this is out of the aformentioned range listed, please consider follow up with your Primary Care Provider.  ? ?________________________________________________________ ? ?The [facility] providers would like to encourage you to use [facility] to communicate with providers for non-urgent requests or questions.  Due to long hold times on the telephone, sending your provider a message by [facility] may be a faster and more efficient way to get a response.  Please allow 48 business hours for a response.  Please remember that this is for non-urgent requests.  ?_______________________________________________________ ? ?You have been scheduled for a colonoscopy. Please follow written instructions given to you at your visit today.  ?Please pick up your prep supplies at the pharmacy within the next 1-3 days. ?If you use inhalers (even only as needed), please bring them with you on the day of your procedure. ? ?It was a pleasure to see you today! ? ?Thank you for trusting me with your gastrointestinal care!   ? ? ?

## 2022-03-16 NOTE — Progress Notes (Signed)
Addendum: Reviewed and agree with assessment and management plan. Joie Reamer M, MD  

## 2022-03-21 ENCOUNTER — Telehealth: Payer: Self-pay | Admitting: Internal Medicine

## 2022-03-21 NOTE — Telephone Encounter (Signed)
Patient is returning your call.  

## 2022-03-22 NOTE — Telephone Encounter (Signed)
Left a message to return call. Need to review Dr Porfirio Oar insulin pump requirements prior to scheduled colonoscopy. ?

## 2022-03-22 NOTE — Telephone Encounter (Signed)
Per Dr Almetta Lovely response- patient is to start a 50% temp basal mornign of the procedure. E will resume standard basal after procedure when he is to be PO.  ? ?Patient has been notified and aware of these requirements.  ?

## 2022-03-29 ENCOUNTER — Ambulatory Visit: Payer: Medicare HMO | Admitting: Orthopedic Surgery

## 2022-03-29 ENCOUNTER — Ambulatory Visit (INDEPENDENT_AMBULATORY_CARE_PROVIDER_SITE_OTHER): Payer: Medicare HMO

## 2022-03-29 DIAGNOSIS — M79671 Pain in right foot: Secondary | ICD-10-CM

## 2022-03-29 DIAGNOSIS — M84374D Stress fracture, right foot, subsequent encounter for fracture with routine healing: Secondary | ICD-10-CM

## 2022-03-30 ENCOUNTER — Encounter: Payer: Medicare HMO | Admitting: Gastroenterology

## 2022-03-30 DIAGNOSIS — X32XXXA Exposure to sunlight, initial encounter: Secondary | ICD-10-CM | POA: Diagnosis not present

## 2022-03-30 DIAGNOSIS — S90822A Blister (nonthermal), left foot, initial encounter: Secondary | ICD-10-CM | POA: Diagnosis not present

## 2022-03-30 DIAGNOSIS — L570 Actinic keratosis: Secondary | ICD-10-CM | POA: Diagnosis not present

## 2022-04-20 DIAGNOSIS — S90822D Blister (nonthermal), left foot, subsequent encounter: Secondary | ICD-10-CM | POA: Diagnosis not present

## 2022-04-30 ENCOUNTER — Ambulatory Visit: Payer: Medicare HMO | Admitting: Orthopedic Surgery

## 2022-04-30 ENCOUNTER — Ambulatory Visit (INDEPENDENT_AMBULATORY_CARE_PROVIDER_SITE_OTHER): Payer: Medicare HMO

## 2022-04-30 DIAGNOSIS — M79671 Pain in right foot: Secondary | ICD-10-CM

## 2022-04-30 DIAGNOSIS — M84374D Stress fracture, right foot, subsequent encounter for fracture with routine healing: Secondary | ICD-10-CM | POA: Diagnosis not present

## 2022-05-01 ENCOUNTER — Encounter: Payer: Self-pay | Admitting: Orthopedic Surgery

## 2022-05-01 NOTE — Progress Notes (Signed)
Office Visit Note   Patient: Grant [name]           Date of Birth: November 18, 1971           MRN: [id number] Visit Date: 04/30/2022              Requested by: Donnajean Lopes, MD [address] [place],  [place] [address] PCP: Donnajean Lopes, MD  Chief Complaint  Patient presents with   Right Foot - Pain, Follow-up      HPI: Patient is a 51 year old gentleman who is status post fifth ray amputation the right he developed a stress fracture fourth metatarsal shaft fracture on the right.  Patient is currently in the fracture boot states he is having swelling dorsally over his foot.  He is 2 months out from his injury.  Assessment & Plan: Visit Diagnoses:  1. Pain in right foot   2. Stress fracture of metatarsal bone of right foot with routine healing, subsequent encounter     Plan: Patient is showing excellent healing recommended a stiff soled sneaker weightbearing as tolerated Achilles stretching.  Recommended wall or alpaca socks for the fungal rash on the left heel.  Follow-Up Instructions: Return in about 4 weeks (around 05/28/2022).   Ortho Exam  Patient is alert, oriented, no adenopathy, well-dressed, normal affect, normal respiratory effort. Examination the fracture site is stable nontender to attempted manipulation.  There is no redness no cellulitis no open wounds no signs of infection.  Patient does have some fungal dermatitis on the medial aspect of the left heel.  Imaging: XR Foot Complete Right  Result Date: 05/01/2022 Three-view radiographs of the right foot shows excellent callus formation fourth metatarsal midshaft fracture.  The metatarsal heads have a good cascade.  No images are attached to the encounter.  Labs: Lab Results  Component Value Date   HGBA1C 9.4 (H) 06/09/2018   ESRSEDRATE 2 11/30/2015   REPTSTATUS 12/12/2020 FINAL 12/07/2020   GRAMSTAIN NO WBC SEEN NO ORGANISMS SEEN  12/07/2020   CULT  12/07/2020    No growth aerobically or  anaerobically. Performed at [facility], [place] [address] [place], [place] [address]      Lab Results  Component Value Date   ALBUMIN 4.5 08/01/2018   ALBUMIN 3.7 07/28/2018   ALBUMIN 4.2 07/27/2018    Lab Results  Component Value Date   MG 2.0 07/30/2018   MG 2.0 07/28/2018   MG 1.8 07/27/2018   No results found for: VD25OH  No results found for: PREALBUMIN    Latest Ref Rng & Units 12/07/2020   10:27 AM 08/01/2018    7:49 AM 07/30/2018    4:00 AM  CBC EXTENDED  WBC 4.0 - 10.5 K/uL 11.8   12.6   8.1    RBC 4.22 - 5.81 MIL/uL 5.05   5.23   4.44    Hemoglobin 13.0 - 17.0 g/dL 14.5   15.9   13.2    HCT 39.0 - 52.0 % 42.7   43.8   38.4    Platelets 150 - 400 K/uL 254   222   175    NEUT# 1.7 - 7.7 K/uL  10.0     Lymph# 0.7 - 4.0 K/uL  1.5        There is no height or weight on file to calculate BMI.  Orders:  Orders Placed This Encounter  Procedures   XR Foot Complete Right   No orders of the defined types were  placed in this encounter.    Procedures: No procedures performed  Clinical Data: No additional findings.  ROS:  All other systems negative, except as noted in the HPI. Review of Systems  Objective: Vital Signs: There were no vitals taken for this visit.  Specialty Comments:  No specialty comments available.  PMFS History: Patient Active Problem List   Diagnosis Date Noted   Osteomyelitis of fifth toe of right foot ([place])    Alcohol use disorder, severe, dependence ([place]) 04/16/2019   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[place]) 04/16/2019   Cannabis hyperemesis syndrome concurrent with and due to cannabis abuse ([place]) 08/01/2018   Gastroesophageal reflux disease 07/17/2018   Long QT interval 07/16/2018   Early satiety    Esophagitis, [place] grade D    Hypercalcemia 06/09/2018   SIRS (systemic inflammatory response syndrome) ([place]) 06/09/2018   Hematemesis 06/09/2018   Type 2 diabetes mellitus with  peripheral neuropathy ([place]) 06/09/2018   Closed displaced fracture of first metatarsal bone of left foot 07/31/2017   Acidosis 09/17/2016   Dehydration 09/17/2016   Abdominal pain 09/17/2016   DKA (diabetic ketoacidoses) 09/17/2016   Nausea and vomiting 09/17/2016   IDDM (insulin dependent diabetes mellitus)    Gastroparesis    Diabetic peripheral neuropathy ([place]) 11/30/2015   Chronic low back pain 11/30/2015   Coronary artery spasm ([place]) 08/17/2013   Hyperlipidemia 08/17/2013   Peripheral neuropathy 08/05/2013   Chest pain 08/05/2013   Fatigue 08/05/2013   Tachycardia 08/05/2013   DOE (dyspnea on exertion) 08/05/2013   Diabetes mellitus, insulin dependent (IDDM), uncontrolled 02/09/2008   Anxiety state 02/09/2008   Essential hypertension 02/09/2008   ALCOHOL ABUSE, HX OF 02/09/2008   LIVER FUNCTION TESTS, ABNORMAL, HX OF 02/09/2008   NEOPLASM, BENIGN, ESOPHAGUS 09/16/2007   Gastritis 09/16/2007   Past Medical History:  Diagnosis Date   Alcohol abuse    Anxiety    Arteritic AION (anterior ischemic optic neuropathy)    Chronic low back pain 11/30/2015   Chronic pain    Depression    Diabetic peripheral neuropathy ([place]) 11/30/2015   Gastric ulcer    Hypertension    Lumbar radiculopathy    Neuropathy    Sleep apnea    Type 2 diabetes mellitus ([place])     Family History  Problem Relation Age of Onset   Diabetes Mother    Hypertension Mother    Hypertension Father    Hyperlipidemia Father    Throat cancer Father    Alcohol abuse Brother    Brain cancer Maternal Grandfather    Stomach cancer Paternal Grandfather    Colon cancer Neg Hx    Esophageal cancer Neg Hx    Rectal cancer Neg Hx     Past Surgical History:  Procedure Laterality Date   AMPUTATION Right 12/07/2020   Procedure: RIGHT FOOT 5TH RAY AMPUTATION;  Surgeon: [name], MD;  Location: [facility];  Service: Orthopedics;  Laterality: Right;   back lipoma resection  03/2006   BIOPSY  06/15/2018    Procedure: BIOPSY;  Surgeon: Jerene Bears, MD;  Location: [facility];  Service: Gastroenterology;;   CARDIAC CATHETERIZATION     ESOPHAGOGASTRODUODENOSCOPY (EGD) WITH PROPOFOL N/A 06/15/2018   Procedure: ESOPHAGOGASTRODUODENOSCOPY (EGD) WITH PROPOFOL;  Surgeon: Jerene Bears, MD;  Location: [facility] ENDOSCOPY;  Service: Gastroenterology;  Laterality: N/A;   KNEE ARTHROSCOPY Left 10/2008   Meniscus Repair   KNEE ARTHROSCOPY W/ ACL RECONSTRUCTION Left 1998   LEFT HEART CATHETERIZATION WITH  CORONARY ANGIOGRAM N/A 08/11/2013   Procedure: LEFT HEART CATHETERIZATION WITH CORONARY ANGIOGRAM;  Surgeon: Pixie Casino, MD;  Location: [facility] CATH LAB;  Service: Cardiovascular;  Laterality: N/A;   LOWER EXTREMITY ANGIOGRAPHY Right 11/10/2020   Procedure: LOWER EXTREMITY ANGIOGRAPHY;  Surgeon: Algernon Huxley, MD;  Location: [facility] CV LAB;  Service: Cardiovascular;  Laterality: Right;   MENISCUS REPAIR Left 2001   TOTAL KNEE ARTHROPLASTY Left    UPPER GI ENDOSCOPY  10/2007   showed gastritis   Social History   Occupational History   Occupation: disability  Tobacco Use   Smoking status: Former    Packs/day: 0.50    Years: 20.00    Pack years: 10.00    Types: Cigarettes    Quit date: 03/23/2016    Years since quitting: 6.1   Smokeless tobacco: Current    Types: Chew   Tobacco comments:     chews daily  Vaping Use   Vaping Use: Never used  Substance and Sexual Activity   Alcohol use: No    Comment: Recovered alcoholic   Drug use: No   Sexual activity: Not on file

## 2022-05-02 DIAGNOSIS — G4733 Obstructive sleep apnea (adult) (pediatric): Secondary | ICD-10-CM | POA: Diagnosis not present

## 2022-05-06 ENCOUNTER — Encounter: Payer: Self-pay | Admitting: Orthopedic Surgery

## 2022-05-06 NOTE — Progress Notes (Signed)
Office Visit Note   Patient: Grant [name]           Date of Birth: 1971/10/14           MRN: [id number] Visit Date: 03/29/2022              Requested by: Donnajean Lopes, MD [address] [place],  [place] [address] PCP: Donnajean Lopes, MD  Chief Complaint  Patient presents with   Right Foot - Follow-up      HPI: Patient is a 51 year old gentleman who presents in follow-up for a stress fracture right foot fourth metatarsal.  Patient is status post fifth ray amputation as well.  Patient has been in a fracture boot.  Assessment & Plan: Visit Diagnoses:  1. Pain in right foot   2. Fracture, stress, metatarsal, right, with routine healing, subsequent encounter     Plan: Recommended use the fracture boot for 4 weeks then advance to regular shoewear.  Repeat 2 view radiographs of the right foot at follow-up.  Recommended the Axiom compression sock.  Follow-Up Instructions: Return in about 4 weeks (around 04/26/2022).   Ortho Exam  Patient is alert, oriented, no adenopathy, well-dressed, normal affect, normal respiratory effort. Examination patient does have a fungal rash on the left heel medially recommended the Vive wear socks.  There is dry cracked skin on both feet also consistent with a fungal rash.  The fracture site is nontender to palpation and stable with distraction.  Imaging: No results found. No images are attached to the encounter.  Labs: Lab Results  Component Value Date   HGBA1C 9.4 (H) 06/09/2018   ESRSEDRATE 2 11/30/2015   REPTSTATUS 12/12/2020 FINAL 12/07/2020   GRAMSTAIN NO WBC SEEN NO ORGANISMS SEEN  12/07/2020   CULT  12/07/2020    No growth aerobically or anaerobically. Performed at [facility], [place] [address], [place], [place] [address]      Lab Results  Component Value Date   ALBUMIN 4.5 08/01/2018   ALBUMIN 3.7 07/28/2018   ALBUMIN 4.2 07/27/2018    Lab Results  Component Value Date   MG 2.0 07/30/2018   MG 2.0  07/28/2018   MG 1.8 07/27/2018   No results found for: "VD25OH"  No results found for: "PREALBUMIN"    Latest Ref Rng & Units 12/07/2020   10:27 AM 08/01/2018    7:49 AM 07/30/2018    4:00 AM  CBC EXTENDED  WBC 4.0 - 10.5 K/uL 11.8  12.6  8.1   RBC 4.22 - 5.81 MIL/uL 5.05  5.23  4.44   Hemoglobin 13.0 - 17.0 g/dL 14.5  15.9  13.2   HCT 39.0 - 52.0 % 42.7  43.8  38.4   Platelets 150 - 400 K/uL 254  222  175   NEUT# 1.7 - 7.7 K/uL  10.0    Lymph# 0.7 - 4.0 K/uL  1.5       There is no height or weight on file to calculate BMI.  Orders:  Orders Placed This Encounter  Procedures   XR Foot 2 Views Right   No orders of the defined types were placed in this encounter.    Procedures: No procedures performed  Clinical Data: No additional findings.  ROS:  All other systems negative, except as noted in the HPI. Review of Systems  Objective: Vital Signs: There were no vitals taken for this visit.  Specialty Comments:  No specialty comments available.  PMFS History: Patient Active Problem List  Diagnosis Date Noted   Osteomyelitis of fifth toe of right foot ([place])    Alcohol use disorder, severe, dependence ([place]) 04/16/2019   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[place]) 04/16/2019   Cannabis hyperemesis syndrome concurrent with and due to cannabis abuse ([place]) 08/01/2018   Gastroesophageal reflux disease 07/17/2018   Long QT interval 07/16/2018   Early satiety    Esophagitis, [place] grade D    Hypercalcemia 06/09/2018   SIRS (systemic inflammatory response syndrome) ([place]) 06/09/2018   Hematemesis 06/09/2018   Type 2 diabetes mellitus with peripheral neuropathy ([place]) 06/09/2018   Closed displaced fracture of first metatarsal bone of left foot 07/31/2017   Acidosis 09/17/2016   Dehydration 09/17/2016   Abdominal pain 09/17/2016   DKA (diabetic ketoacidoses) 09/17/2016   Nausea and vomiting 09/17/2016   IDDM (insulin dependent  diabetes mellitus)    Gastroparesis    Diabetic peripheral neuropathy ([place]) 11/30/2015   Chronic low back pain 11/30/2015   Coronary artery spasm ([place]) 08/17/2013   Hyperlipidemia 08/17/2013   Peripheral neuropathy 08/05/2013   Chest pain 08/05/2013   Fatigue 08/05/2013   Tachycardia 08/05/2013   DOE (dyspnea on exertion) 08/05/2013   Diabetes mellitus, insulin dependent (IDDM), uncontrolled 02/09/2008   Anxiety state 02/09/2008   Essential hypertension 02/09/2008   ALCOHOL ABUSE, HX OF 02/09/2008   LIVER FUNCTION TESTS, ABNORMAL, HX OF 02/09/2008   NEOPLASM, BENIGN, ESOPHAGUS 09/16/2007   Gastritis 09/16/2007   Past Medical History:  Diagnosis Date   Alcohol abuse    Anxiety    Arteritic AION (anterior ischemic optic neuropathy)    Chronic low back pain 11/30/2015   Chronic pain    Depression    Diabetic peripheral neuropathy ([place]) 11/30/2015   Gastric ulcer    Hypertension    Lumbar radiculopathy    Neuropathy    Sleep apnea    Type 2 diabetes mellitus ([place])     Family History  Problem Relation Age of Onset   Diabetes Mother    Hypertension Mother    Hypertension Father    Hyperlipidemia Father    Throat cancer Father    Alcohol abuse Brother    Brain cancer Maternal Grandfather    Stomach cancer Paternal Grandfather    Colon cancer Neg Hx    Esophageal cancer Neg Hx    Rectal cancer Neg Hx     Past Surgical History:  Procedure Laterality Date   AMPUTATION Right 12/07/2020   Procedure: RIGHT FOOT 5TH RAY AMPUTATION;  Surgeon: [name], MD;  Location: [facility];  Service: Orthopedics;  Laterality: Right;   back lipoma resection  03/2006   BIOPSY  06/15/2018   Procedure: BIOPSY;  Surgeon: Jerene Bears, MD;  Location: [facility];  Service: Gastroenterology;;   CARDIAC CATHETERIZATION     ESOPHAGOGASTRODUODENOSCOPY (EGD) WITH PROPOFOL N/A 06/15/2018   Procedure: ESOPHAGOGASTRODUODENOSCOPY (EGD) WITH PROPOFOL;  Surgeon: Jerene Bears, MD;  Location: [facility]  ENDOSCOPY;  Service: Gastroenterology;  Laterality: N/A;   KNEE ARTHROSCOPY Left 10/2008   Meniscus Repair   KNEE ARTHROSCOPY W/ ACL RECONSTRUCTION Left 1998   LEFT HEART CATHETERIZATION WITH CORONARY ANGIOGRAM N/A 08/11/2013   Procedure: LEFT HEART CATHETERIZATION WITH CORONARY ANGIOGRAM;  Surgeon: Pixie Casino, MD;  Location[facility] CATH LAB;  Service: Cardiovascular;  Laterality: N/A;   LOWER EXTREMITY ANGIOGRAPHY Right 11/10/2020   Procedure: LOWER EXTREMITY ANGIOGRAPHY;  Surgeon: Algernon Huxley, MD;  Location: [facility] CV LAB;  Service: Cardiovascular;  Laterality: Right;  MENISCUS REPAIR Left 2001   TOTAL KNEE ARTHROPLASTY Left    UPPER GI ENDOSCOPY  10/2007   showed gastritis   Social History   Occupational History   Occupation: disability  Tobacco Use   Smoking status: Former    Packs/day: 0.50    Years: 20.00    Total pack years: 10.00    Types: Cigarettes    Quit date: 03/23/2016    Years since quitting: 6.1   Smokeless tobacco: Current    Types: Chew   Tobacco comments:     chews daily  Vaping Use   Vaping Use: Never used  Substance and Sexual Activity   Alcohol use: No    Comment: Recovered alcoholic   Drug use: No   Sexual activity: Not on file

## 2022-05-17 DIAGNOSIS — E1165 Type 2 diabetes mellitus with hyperglycemia: Secondary | ICD-10-CM | POA: Diagnosis not present

## 2022-05-17 DIAGNOSIS — E1140 Type 2 diabetes mellitus with diabetic neuropathy, unspecified: Secondary | ICD-10-CM | POA: Diagnosis not present

## 2022-05-17 DIAGNOSIS — E119 Type 2 diabetes mellitus without complications: Secondary | ICD-10-CM | POA: Diagnosis not present

## 2022-05-22 ENCOUNTER — Encounter: Payer: Self-pay | Admitting: Gastroenterology

## 2022-05-25 ENCOUNTER — Encounter: Payer: Self-pay | Admitting: Internal Medicine

## 2022-05-25 ENCOUNTER — Ambulatory Visit (AMBULATORY_SURGERY_CENTER): Payer: Medicare HMO | Admitting: Internal Medicine

## 2022-05-25 VITALS — BP 119/70 | HR 80 | Temp 97.3°F | Resp 15 | Ht 71.0 in | Wt 264.0 lb

## 2022-05-25 DIAGNOSIS — Z1211 Encounter for screening for malignant neoplasm of colon: Secondary | ICD-10-CM

## 2022-05-25 DIAGNOSIS — D125 Benign neoplasm of sigmoid colon: Secondary | ICD-10-CM | POA: Diagnosis not present

## 2022-05-25 MED ORDER — SODIUM CHLORIDE 0.9 % IV SOLN: 500.0000 mL | Freq: Once | INTRAVENOUS | Status: DC

## 2022-05-25 NOTE — Op Note (Signed)
[facility] Patient Name: Grant [name] Procedure Date: 05/25/2022 9:26 AM MRN: [id number] Endoscopist: Jerene Bears , MD Age: 51 Referring MD:  Date of Birth: 1971/04/01 Gender: Male Account #: [id number] Procedure:                Colonoscopy Indications:              Screening for colorectal malignant neoplasm, This                            is the patient's first colonoscopy Medicines:                Monitored Anesthesia Care Procedure:                Pre-Anesthesia Assessment:                           - Prior to the procedure, a History and Physical                            was performed, and patient medications and                            allergies were reviewed. The patient's tolerance of                            previous anesthesia was also reviewed. The risks                            and benefits of the procedure and the sedation                            options and risks were discussed with the patient.                            All questions were answered, and informed consent                            was obtained. Prior Anticoagulants: The patient has                            taken no previous anticoagulant or antiplatelet                            agents. ASA Grade Assessment: II - A patient with                            mild systemic disease. After reviewing the risks                            and benefits, the patient was deemed in                            satisfactory condition to undergo the procedure.  After obtaining informed consent, the colonoscope                            was passed under direct vision. Throughout the                            procedure, the patient's blood pressure, pulse, and                            oxygen saturations were monitored continuously. The                            Olympus CF-HQ190L (Serial# [id number]) Colonoscope was                            introduced through the anus and  advanced to the                            cecum, identified by appendiceal orifice and                            ileocecal valve. The colonoscopy was performed                            without difficulty. The patient tolerated the                            procedure well. The quality of the bowel                            preparation was good. The ileocecal valve,                            appendiceal orifice, and rectum were photographed. Scope In: 9:31:03 AM Scope Out: 9:46:15 AM Scope Withdrawal Time: 0 hours 11 minutes 58 seconds  Total Procedure Duration: 0 hours 15 minutes 12 seconds  Findings:                 The digital rectal exam was normal.                           A 5 mm polyp was found in the distal sigmoid colon.                            The polyp was sessile. The polyp was removed with a                            cold snare. Resection and retrieval were complete.                           One 3 mm skin tag was found at the anal canal.                           No additional abnormalities  were found on                            retroflexion. Complications:            No immediate complications. Estimated Blood Loss:     Estimated blood loss was minimal. Impression:               - One 5 mm polyp in the distal sigmoid colon,                            removed with a cold snare. Resected and retrieved.                           - Anal skin tag. Recommendation:           - Patient has a contact number available for                            emergencies. The signs and symptoms of potential                            delayed complications were discussed with the                            patient. Return to normal activities tomorrow.                            Written discharge instructions were provided to the                            patient.                           - Resume previous diet.                           - Continue present medications.                            - Await pathology results.                           - Repeat colonoscopy is recommended. The                            colonoscopy date will be determined after pathology                            results from today's exam become available for                            review. Jerene Bears, MD 05/25/2022 9:51:44 AM This report has been signed electronically.

## 2022-05-25 NOTE — Progress Notes (Signed)
GASTROENTEROLOGY PROCEDURE H&P NOTE   Primary Care Physician: Donnajean Lopes, MD    Reason for Procedure:  Colon cancer screening  Plan:    Colonoscopy  Patient is appropriate for endoscopic procedure(s) in the ambulatory ([place]) setting.  The nature of the procedure, as well as the risks, benefits, and alternatives were carefully and thoroughly reviewed with the patient. Ample time for discussion and questions allowed. The patient understood, was satisfied, and agreed to proceed.     HPI: Grant [name] is a 50 y.o. male who presents for screening colonoscopy.  Medical history as below.  Tolerated the prep.  No recent chest pain or shortness of breath.  No abdominal pain today.  Past Medical History:  Diagnosis Date   Alcohol abuse    Anxiety    Arteritic AION (anterior ischemic optic neuropathy)    Chronic low back pain 11/30/2015   Chronic pain    Depression    Diabetic peripheral neuropathy ([place]) 11/30/2015   Gastric ulcer    Hypertension    Lumbar radiculopathy    Neuropathy    Sleep apnea    Type 2 diabetes mellitus ([place])     Past Surgical History:  Procedure Laterality Date   AMPUTATION Right 12/07/2020   Procedure: RIGHT FOOT 5TH RAY AMPUTATION;  Surgeon: [name], MD;  Location: [facility];  Service: Orthopedics;  Laterality: Right;   back lipoma resection  03/2006   BIOPSY  06/15/2018   Procedure: BIOPSY;  Surgeon: Jerene Bears, MD;  Location: [facility];  Service: Gastroenterology;;   CARDIAC CATHETERIZATION     ESOPHAGOGASTRODUODENOSCOPY (EGD) WITH PROPOFOL N/A 06/15/2018   Procedure: ESOPHAGOGASTRODUODENOSCOPY (EGD) WITH PROPOFOL;  Surgeon: Jerene Bears, MD;  Location: [facility] ENDOSCOPY;  Service: Gastroenterology;  Laterality: N/A;   KNEE ARTHROSCOPY Left 10/2008   Meniscus Repair   KNEE ARTHROSCOPY W/ ACL RECONSTRUCTION Left 1998   LEFT HEART CATHETERIZATION WITH CORONARY ANGIOGRAM N/A 08/11/2013   Procedure: LEFT HEART CATHETERIZATION WITH  CORONARY ANGIOGRAM;  Surgeon: Pixie Casino, MD;  Location: [facility] CATH LAB;  Service: Cardiovascular;  Laterality: N/A;   LOWER EXTREMITY ANGIOGRAPHY Right 11/10/2020   Procedure: LOWER EXTREMITY ANGIOGRAPHY;  Surgeon: Algernon Huxley, MD;  Location: [place] CV LAB;  Service: Cardiovascular;  Laterality: Right;   MENISCUS REPAIR Left 2001   TOTAL KNEE ARTHROPLASTY Left    UPPER GI ENDOSCOPY  10/2007   showed gastritis    Prior to Admission medications   Medication Sig Start Date End Date Taking? Authorizing Provider  aspirin EC 81 MG tablet Take 1 tablet PO daily 03/21/22  Yes Kris Hartmann, NP  atorvastatin (LIPITOR) 10 MG tablet  05/04/21  Yes [provider]  insulin aspart (NOVOLOG) 100 UNIT/ML injection Insulin pump   Yes [provider]  Insulin Human (INSULIN PUMP) SOLN Inject into the skin as directed.   Yes [provider]  losartan-hydrochlorothiazide (HYZAAR) 100-12.5 MG tablet Take 1 tablet by mouth at bedtime. 11/04/20  Yes [provider]  metoprolol succinate (TOPROL-XL) 50 MG 24 hr tablet Take 50 mg by mouth at bedtime. 11/04/20  Yes [provider]  Oxymetazoline HCl (NASAL SPRAY) 0.05 % SOLN Place into the nose as needed.   Yes [provider]  pregabalin (LYRICA) 200 MG capsule Take 200 mg by mouth 3 (three) times daily. 02/20/22  Yes [provider]  traZODone (DESYREL) 100 MG tablet Take 100 mg by mouth at bedtime.   Yes [provider]  zolpidem (AMBIEN) 10 MG tablet Take 10 mg by mouth at bedtime. 11/04/20  Yes [provider]  ibuprofen (ADVIL,MOTRIN) 200 MG tablet Take 200 mg by mouth every 6 (six) hours as needed for moderate pain or mild pain. Patient not taking: Reported on 05/25/2022    [provider]  nitroGLYCERIN (NITRODUR - DOSED IN MG/24 HR) 0.2 mg/hr patch Place 1 patch (0.2 mg total) onto the skin daily. 12/20/20   Persons, Bevely Palmer, PA  Skin Protectants, Misc.  (EUCERIN) cream Apply topically as needed for dry skin. Patient not taking: Reported on 05/25/2022 02/17/21   Suzan Slick, NP    Current Outpatient Medications  Medication Sig Dispense Refill   aspirin EC 81 MG tablet Take 1 tablet PO daily 150 tablet 2   atorvastatin (LIPITOR) 10 MG tablet      insulin aspart (NOVOLOG) 100 UNIT/ML injection Insulin pump     Insulin Human (INSULIN PUMP) SOLN Inject into the skin as directed.     losartan-hydrochlorothiazide (HYZAAR) 100-12.5 MG tablet Take 1 tablet by mouth at bedtime.     metoprolol succinate (TOPROL-XL) 50 MG 24 hr tablet Take 50 mg by mouth at bedtime.     Oxymetazoline HCl (NASAL SPRAY) 0.05 % SOLN Place into the nose as needed.     pregabalin (LYRICA) 200 MG capsule Take 200 mg by mouth 3 (three) times daily.     traZODone (DESYREL) 100 MG tablet Take 100 mg by mouth at bedtime.     zolpidem (AMBIEN) 10 MG tablet Take 10 mg by mouth at bedtime.     ibuprofen (ADVIL,MOTRIN) 200 MG tablet Take 200 mg by mouth every 6 (six) hours as needed for moderate pain or mild pain. (Patient not taking: Reported on 05/25/2022)     nitroGLYCERIN (NITRODUR - DOSED IN MG/24 HR) 0.2 mg/hr patch Place 1 patch (0.2 mg total) onto the skin daily. 30 patch 12   Skin Protectants, Misc. (EUCERIN) cream Apply topically as needed for dry skin. (Patient not taking: Reported on 05/25/2022) 454 g 0   Current Facility-Administered Medications  Medication Dose Route Frequency Provider Last Rate Last Admin   0.9 %  sodium chloride infusion  500 mL Intravenous Once Savon Cobbs, Lajuan Lines, MD        Allergies as of 05/25/2022 - Review Complete 05/25/2022  Allergen Reaction Noted   Codeine Nausea And Vomiting 12/19/2018   Cymbalta [duloxetine hcl]  06/27/2021   Duloxetine  06/27/2021   Glucophage [metformin]  06/27/2021   Lexapro [escitalopram]  06/27/2021   Metformin hcl Diarrhea and Nausea Only 06/07/2021   Ozempic (0.25 or 0.5 mg-dose) [semaglutide(0.25 or 0.'5mg'$ -dos)]   06/27/2021   Tylox [oxycodone-acetaminophen] Nausea And Vomiting 08/05/2013    Family History  Problem Relation Age of Onset   Diabetes Mother    Hypertension Mother    Esophageal cancer Father    Hypertension Father    Hyperlipidemia Father    Throat cancer Father    Alcohol abuse Brother    Brain cancer Maternal Grandfather    Stomach cancer Paternal Grandfather    Colon cancer Neg Hx    Rectal cancer Neg Hx     Social History   Socioeconomic History   Marital status: Married    Spouse name: Not on file   Number of children: 2   Years of education: college   Highest education level: Not on file  Occupational History   Occupation: disability  Tobacco Use   Smoking status: Former  Packs/day: 0.50    Years: 20.00    Total pack years: 10.00    Types: Cigarettes    Quit date: 03/23/2016    Years since quitting: 6.1   Smokeless tobacco: Current    Types: Chew   Tobacco comments:     chews daily  Vaping Use   Vaping Use: Never used  Substance and Sexual Activity   Alcohol use: No    Comment: Recovered alcoholic   Drug use: No   Sexual activity: Not on file  Other Topics Concern   Not on file  Social History Narrative   Patient drinks 1 cup of caffeine daily.   Patient is right handed.    Social Determinants of Health   Financial Resource Strain: Not on file  Food Insecurity: Not on file  Transportation Needs: Not on file  Physical Activity: Not on file  Stress: Not on file  Social Connections: Not on file  Intimate Partner Violence: Not on file    Physical Exam: Vital signs in last 24 hours: '@BP'$  138/85   Pulse 78   Temp (!) 97.3 F (36.3 C)   Ht '5\' 11"'$  (1.803 m)   Wt 264 lb (119.7 kg)   SpO2 99%   BMI 36.82 kg/m  GEN: NAD EYE: Sclerae anicteric ENT: MMM CV: Non-tachycardic Pulm: CTA b/l GI: Soft, NT/ND NEURO:  Alert & Oriented x 3   Zenovia Jarred, MD [facility]  05/25/2022 9:13 AM

## 2022-05-25 NOTE — Patient Instructions (Signed)
Await pathology results.  Handout on polyps given.  YOU HAD AN ENDOSCOPIC PROCEDURE TODAY AT [facility]:   Refer to the procedure report that was given to you for any specific questions about what was found during the examination.  If the procedure report does not answer your questions, please call your gastroenterologist to clarify.  If you requested that your care partner not be given the details of your procedure findings, then the procedure report has been included in a sealed envelope for you to review at your convenience later.  YOU SHOULD EXPECT: Some feelings of bloating in the abdomen. Passage of more gas than usual.  Walking can help get rid of the air that was put into your GI tract during the procedure and reduce the bloating. If you had a lower endoscopy (such as a colonoscopy or flexible sigmoidoscopy) you may notice spotting of blood in your stool or on the toilet paper. If you underwent a bowel prep for your procedure, you may not have a normal bowel movement for a few days.  Please Note:  You might notice some irritation and congestion in your nose or some drainage.  This is from the oxygen used during your procedure.  There is no need for concern and it should clear up in a day or so.  SYMPTOMS TO REPORT IMMEDIATELY:  Following lower endoscopy (colonoscopy or flexible sigmoidoscopy):  Excessive amounts of blood in the stool  Significant tenderness or worsening of abdominal pains  Swelling of the abdomen that is new, acute  Fever of 100F or higher  For urgent or emergent issues, a gastroenterologist can be reached at any hour by calling [phone]. Do not use MyChart messaging for urgent concerns.    DIET:  We do recommend a small meal at first, but then you may proceed to your regular diet.  Drink plenty of fluids but you should avoid alcoholic beverages for 24 hours.  ACTIVITY:  You should plan to take it easy for the rest of today and you should NOT  DRIVE or use heavy machinery until tomorrow (because of the sedation medicines used during the test).    FOLLOW UP: Our staff will call the number listed on your records the next business day following your procedure.  We will call around 7:15- 8:00 am to check on you and address any questions or concerns that you may have regarding the information given to you following your procedure. If we do not reach you, we will leave a message.  If you develop any symptoms (ie: fever, flu-like symptoms, shortness of breath, cough etc.) before then, please call [phone].  If you test positive for Covid 19 in the 2 weeks post procedure, please call and report this information to [place].    If any biopsies were taken you will be contacted by phone or by letter within the next 1-3 weeks.  Please call us at [phone] if you have not heard about the biopsies in 3 weeks.    SIGNATURES/CONFIDENTIALITY: You and/or your care partner have signed paperwork which will be entered into your electronic medical record.  These signatures attest to the fact that that the information above on your After Visit Summary has been reviewed and is understood.  Full responsibility of the confidentiality of this discharge information lies with you and/or your care-partner.

## 2022-05-25 NOTE — Progress Notes (Signed)
PT taken to PACU. Monitors in place. VSS. Report given to RN. 

## 2022-05-25 NOTE — Progress Notes (Signed)
Called to room to assist during endoscopic procedure.  Patient ID and intended procedure confirmed with present staff. Received instructions for my participation in the procedure from the performing physician.  

## 2022-05-28 ENCOUNTER — Ambulatory Visit (INDEPENDENT_AMBULATORY_CARE_PROVIDER_SITE_OTHER): Payer: Medicare HMO

## 2022-05-28 ENCOUNTER — Ambulatory Visit: Payer: Medicare HMO | Admitting: Orthopedic Surgery

## 2022-05-28 ENCOUNTER — Telehealth: Payer: Self-pay

## 2022-05-28 DIAGNOSIS — M84374D Stress fracture, right foot, subsequent encounter for fracture with routine healing: Secondary | ICD-10-CM

## 2022-05-28 NOTE — Telephone Encounter (Signed)
  Follow up Call-     05/25/2022    8:11 AM  Call back number  Post procedure Call Back phone  # [phone]  Permission to leave phone message Yes     Left message

## 2022-05-30 ENCOUNTER — Encounter: Payer: Self-pay | Admitting: Orthopedic Surgery

## 2022-05-30 NOTE — Progress Notes (Signed)
Office Visit Note   Patient: Grant [name]           Date of Birth: 09-13-1971           MRN: [id number] Visit Date: 05/28/2022              Requested by: Donnajean Lopes, MD [address] [place],  [place] [address] PCP: Donnajean Lopes, MD  Chief Complaint  Patient presents with   Right Foot - Follow-up    S/p 5th ray amputation 12/07/20, stress fracture 4th MT      HPI: Patient is a 51 year old gentleman who presents in follow-up for a stress fracture fourth metatarsal.  Patient is status post fifth ray amputation January 2022.  Patient has neuropathy and cannot tell if his foot is getting better.  Assessment & Plan: Visit Diagnoses:  1. Stress fracture of metatarsal bone of right foot with routine healing, subsequent encounter     Plan: Radiographs show stable callus formation.  Recommended that he can advance activities as tolerated in his [place] sneakers work on Achilles stretching follow-up 4 weeks with three-view radiographs of the right foot.  Follow-Up Instructions: Return in about 4 weeks (around 06/25/2022).   Ortho Exam  Patient is alert, oriented, no adenopathy, well-dressed, normal affect, normal respiratory effort. Examination there is no redness cellulitis or ulceration.  The fracture site is nontender with distraction.  Imaging: No results found. No images are attached to the encounter.  Labs: Lab Results  Component Value Date   HGBA1C 9.4 (H) 06/09/2018   ESRSEDRATE 2 11/30/2015   REPTSTATUS 12/12/2020 FINAL 12/07/2020   GRAMSTAIN NO WBC SEEN NO ORGANISMS SEEN  12/07/2020   CULT  12/07/2020    No growth aerobically or anaerobically. Performed at [facility], [place] [address] [place], [place] [address]      Lab Results  Component Value Date   ALBUMIN 4.5 08/01/2018   ALBUMIN 3.7 07/28/2018   ALBUMIN 4.2 07/27/2018    Lab Results  Component Value Date   MG 2.0 07/30/2018   MG 2.0 07/28/2018   MG 1.8 07/27/2018   No  results found for: "VD25OH"  No results found for: "PREALBUMIN"    Latest Ref Rng & Units 12/07/2020   10:27 AM 08/01/2018    7:49 AM 07/30/2018    4:00 AM  CBC EXTENDED  WBC 4.0 - 10.5 K/uL 11.8  12.6  8.1   RBC 4.22 - 5.81 MIL/uL 5.05  5.23  4.44   Hemoglobin 13.0 - 17.0 g/dL 14.5  15.9  13.2   HCT 39.0 - 52.0 % 42.7  43.8  38.4   Platelets 150 - 400 K/uL 254  222  175   NEUT# 1.7 - 7.7 K/uL  10.0    Lymph# 0.7 - 4.0 K/uL  1.5       There is no height or weight on file to calculate BMI.  Orders:  Orders Placed This Encounter  Procedures   XR Foot Complete Right   No orders of the defined types were placed in this encounter.    Procedures: No procedures performed  Clinical Data: No additional findings.  ROS:  All other systems negative, except as noted in the HPI. Review of Systems  Objective: Vital Signs: There were no vitals taken for this visit.  Specialty Comments:  No specialty comments available.  PMFS History: Patient Active Problem List   Diagnosis Date Noted   Osteomyelitis of fifth toe of right foot ([place])  Alcohol use disorder, severe, dependence ([place]) 04/16/2019   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[place]) 04/16/2019   Cannabis hyperemesis syndrome concurrent with and due to cannabis abuse ([place]) 08/01/2018   Gastroesophageal reflux disease 07/17/2018   Long QT interval 07/16/2018   Early satiety    Esophagitis, [place] grade D    Hypercalcemia 06/09/2018   SIRS (systemic inflammatory response syndrome) ([place]) 06/09/2018   Hematemesis 06/09/2018   Type 2 diabetes mellitus with peripheral neuropathy ([place]) 06/09/2018   Closed displaced fracture of first metatarsal bone of left foot 07/31/2017   Acidosis 09/17/2016   Dehydration 09/17/2016   Abdominal pain 09/17/2016   DKA (diabetic ketoacidoses) 09/17/2016   Nausea and vomiting 09/17/2016   IDDM (insulin dependent diabetes mellitus)    Gastroparesis     Diabetic peripheral neuropathy ([place]) 11/30/2015   Chronic low back pain 11/30/2015   Coronary artery spasm ([place]) 08/17/2013   Hyperlipidemia 08/17/2013   Peripheral neuropathy 08/05/2013   Chest pain 08/05/2013   Fatigue 08/05/2013   Tachycardia 08/05/2013   DOE (dyspnea on exertion) 08/05/2013   Diabetes mellitus, insulin dependent (IDDM), uncontrolled 02/09/2008   Anxiety state 02/09/2008   Essential hypertension 02/09/2008   ALCOHOL ABUSE, HX OF 02/09/2008   LIVER FUNCTION TESTS, ABNORMAL, HX OF 02/09/2008   NEOPLASM, BENIGN, ESOPHAGUS 09/16/2007   Gastritis 09/16/2007   Past Medical History:  Diagnosis Date   Alcohol abuse    Anxiety    Arteritic AION (anterior ischemic optic neuropathy)    Chronic low back pain 11/30/2015   Chronic pain    Depression    Diabetic peripheral neuropathy ([place]) 11/30/2015   Gastric ulcer    Hypertension    Lumbar radiculopathy    Neuropathy    Sleep apnea    Type 2 diabetes mellitus ([place])     Family History  Problem Relation Age of Onset   Diabetes Mother    Hypertension Mother    Esophageal cancer Father    Hypertension Father    Hyperlipidemia Father    Throat cancer Father    Alcohol abuse Brother    Brain cancer Maternal Grandfather    Stomach cancer Paternal Grandfather    Colon cancer Neg Hx    Rectal cancer Neg Hx     Past Surgical History:  Procedure Laterality Date   AMPUTATION Right 12/07/2020   Procedure: RIGHT FOOT 5TH RAY AMPUTATION;  Surgeon: [name], MD;  Location: [facility];  Service: Orthopedics;  Laterality: Right;   back lipoma resection  03/2006   BIOPSY  06/15/2018   Procedure: BIOPSY;  Surgeon: Jerene Bears, MD;  Location: [facility];  Service: Gastroenterology;;   CARDIAC CATHETERIZATION     ESOPHAGOGASTRODUODENOSCOPY (EGD) WITH PROPOFOL N/A 06/15/2018   Procedure: ESOPHAGOGASTRODUODENOSCOPY (EGD) WITH PROPOFOL;  Surgeon: Jerene Bears, MD;  Location: [facility] ENDOSCOPY;  Service: Gastroenterology;   Laterality: N/A;   KNEE ARTHROSCOPY Left 10/2008   Meniscus Repair   KNEE ARTHROSCOPY W/ ACL RECONSTRUCTION Left 1998   LEFT HEART CATHETERIZATION WITH CORONARY ANGIOGRAM N/A 08/11/2013   Procedure: LEFT HEART CATHETERIZATION WITH CORONARY ANGIOGRAM;  Surgeon: Pixie Casino, MD;  Location: [facility] CATH LAB;  Service: Cardiovascular;  Laterality: N/A;   LOWER EXTREMITY ANGIOGRAPHY Right 11/10/2020   Procedure: LOWER EXTREMITY ANGIOGRAPHY;  Surgeon: Algernon Huxley, MD;  Location: [place] CV LAB;  Service: Cardiovascular;  Laterality: Right;   MENISCUS REPAIR Left 2001   TOTAL KNEE ARTHROPLASTY Left    UPPER GI ENDOSCOPY  10/2007   showed gastritis   Social History   Occupational History   Occupation: disability  Tobacco Use   Smoking status: Former    Packs/day: 0.50    Years: 20.00    Total pack years: 10.00    Types: Cigarettes    Quit date: 03/23/2016    Years since quitting: 6.1   Smokeless tobacco: Current    Types: Chew   Tobacco comments:     chews daily  Vaping Use   Vaping Use: Never used  Substance and Sexual Activity   Alcohol use: No    Comment: Recovered alcoholic   Drug use: No   Sexual activity: Not on file

## 2022-06-05 ENCOUNTER — Encounter: Payer: Self-pay | Admitting: Internal Medicine

## 2022-06-26 ENCOUNTER — Ambulatory Visit: Payer: Medicare HMO | Admitting: Orthopedic Surgery

## 2022-06-26 ENCOUNTER — Ambulatory Visit (INDEPENDENT_AMBULATORY_CARE_PROVIDER_SITE_OTHER): Payer: Medicare HMO

## 2022-06-26 DIAGNOSIS — M84374D Stress fracture, right foot, subsequent encounter for fracture with routine healing: Secondary | ICD-10-CM

## 2022-06-26 DIAGNOSIS — M6701 Short Achilles tendon (acquired), right ankle: Secondary | ICD-10-CM | POA: Diagnosis not present

## 2022-06-27 ENCOUNTER — Encounter: Payer: Self-pay | Admitting: Orthopedic Surgery

## 2022-06-27 NOTE — Progress Notes (Signed)
Office Visit Note   Patient: Grant [name]           Date of Birth: [birth year]           MRN: [id number] Visit Date: 06/26/2022              Requested by: Donnajean Lopes, MD [id number] [place],  [place] [address] PCP: Donnajean Lopes, MD  Chief Complaint  Patient presents with   Right Foot - Follow-up    Stress fx 4th MT Hx 5th ray amputation 12/07/20      HPI: Patient is a 51 year old gentleman who is seen in follow-up for stress fracture right foot fourth metatarsal shaft status post fifth ray amputation.  Patient is currently ambulating in [facility] sneakers and is doing Achilles stretching.  Patient states he feels well.  Assessment & Plan: Visit Diagnoses:  1. Fracture, stress, metatarsal, right, with routine healing, subsequent encounter     Plan: Continue with Achilles stretching continue with the [place] sneakers, okay to start calf raises and fascial strengthening  Follow-Up Instructions: Return if symptoms worsen or fail to improve.   Ortho Exam  Patient is alert, oriented, no adenopathy, well-dressed, normal affect, normal respiratory effort. Examination the fracture site is nontender there is no redness or cellulitis, still has a tight Achilles with dorsiflexion to neutral.  Imaging: No results found. No images are attached to the encounter.  Labs: Lab Results  Component Value Date   HGBA1C 9.4 (H) 06/09/2018   ESRSEDRATE 2 11/30/2015   REPTSTATUS 12/12/2020 FINAL 12/07/2020   GRAMSTAIN NO WBC SEEN NO ORGANISMS SEEN  12/07/2020   CULT  12/07/2020    No growth aerobically or anaerobically. Performed at [facility], [place] [address], [place], [place] [address]      Lab Results  Component Value Date   ALBUMIN 4.5 08/01/2018   ALBUMIN 3.7 07/28/2018   ALBUMIN 4.2 07/27/2018    Lab Results  Component Value Date   MG 2.0 07/30/2018   MG 2.0 07/28/2018   MG 1.8 07/27/2018   No results found for: "VD25OH"  No results found for:  "PREALBUMIN"    Latest Ref Rng & Units 12/07/2020   10:27 AM 08/01/2018    7:49 AM 07/30/2018    4:00 AM  CBC EXTENDED  WBC 4.0 - 10.5 K/uL 11.8  12.6  8.1   RBC 4.22 - 5.81 MIL/uL 5.05  5.23  4.44   Hemoglobin 13.0 - 17.0 g/dL 14.5  15.9  13.2   HCT 39.0 - 52.0 % 42.7  43.8  38.4   Platelets 150 - 400 K/uL 254  222  175   NEUT# 1.7 - 7.7 K/uL  10.0    Lymph# 0.7 - 4.0 K/uL  1.5       There is no height or weight on file to calculate BMI.  Orders:  Orders Placed This Encounter  Procedures   XR Foot Complete Right   No orders of the defined types were placed in this encounter.    Procedures: No procedures performed  Clinical Data: No additional findings.  ROS:  All other systems negative, except as noted in the HPI. Review of Systems  Objective: Vital Signs: There were no vitals taken for this visit.  Specialty Comments:  No specialty comments available.  PMFS History: Patient Active Problem List   Diagnosis Date Noted   Osteomyelitis of fifth toe of right foot ([place])    Alcohol use disorder, severe, dependence ([place])  04/16/2019   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[place]) 04/16/2019   Cannabis hyperemesis syndrome concurrent with and due to cannabis abuse ([place]) 08/01/2018   Gastroesophageal reflux disease 07/17/2018   Long QT interval 07/16/2018   Early satiety    Esophagitis, [place] grade D    Hypercalcemia 06/09/2018   SIRS (systemic inflammatory response syndrome) ([place]) 06/09/2018   Hematemesis 06/09/2018   Type 2 diabetes mellitus with peripheral neuropathy ([place]) 06/09/2018   Closed displaced fracture of first metatarsal bone of left foot 07/31/2017   Acidosis 09/17/2016   Dehydration 09/17/2016   Abdominal pain 09/17/2016   DKA (diabetic ketoacidoses) 09/17/2016   Nausea and vomiting 09/17/2016   IDDM (insulin dependent diabetes mellitus)    Gastroparesis    Diabetic peripheral neuropathy ([place]) 11/30/2015    Chronic low back pain 11/30/2015   Coronary artery spasm ([place]) 08/17/2013   Hyperlipidemia 08/17/2013   Peripheral neuropathy 08/05/2013   Chest pain 08/05/2013   Fatigue 08/05/2013   Tachycardia 08/05/2013   DOE (dyspnea on exertion) 08/05/2013   Diabetes mellitus, insulin dependent (IDDM), uncontrolled 02/09/2008   Anxiety state 02/09/2008   Essential hypertension 02/09/2008   ALCOHOL ABUSE, HX OF 02/09/2008   LIVER FUNCTION TESTS, ABNORMAL, HX OF 02/09/2008   NEOPLASM, BENIGN, ESOPHAGUS 09/16/2007   Gastritis 09/16/2007   Past Medical History:  Diagnosis Date   Alcohol abuse    Anxiety    Arteritic AION (anterior ischemic optic neuropathy)    Chronic low back pain 11/30/2015   Chronic pain    Depression    Diabetic peripheral neuropathy ([place]) 11/30/2015   Gastric ulcer    Hypertension    Lumbar radiculopathy    Neuropathy    Sleep apnea    Type 2 diabetes mellitus ([place])     Family History  Problem Relation Age of Onset   Diabetes Mother    Hypertension Mother    Esophageal cancer Father    Hypertension Father    Hyperlipidemia Father    Throat cancer Father    Alcohol abuse Brother    Brain cancer Maternal Grandfather    Stomach cancer Paternal Grandfather    Colon cancer Neg Hx    Rectal cancer Neg Hx     Past Surgical History:  Procedure Laterality Date   AMPUTATION Right 12/07/2020   Procedure: RIGHT FOOT 5TH RAY AMPUTATION;  Surgeon: [name], MD;  Location: [facility];  Service: Orthopedics;  Laterality: Right;   back lipoma resection  03/2006   BIOPSY  06/15/2018   Procedure: BIOPSY;  Surgeon: Jerene Bears, MD;  Location: [facility] ENDOSCOPY;  Service: Gastroenterology;;   CARDIAC CATHETERIZATION     ESOPHAGOGASTRODUODENOSCOPY (EGD) WITH PROPOFOL N/A 06/15/2018   Procedure: ESOPHAGOGASTRODUODENOSCOPY (EGD) WITH PROPOFOL;  Surgeon: Jerene Bears, MD;  Location: [facility] ENDOSCOPY;  Service: Gastroenterology;  Laterality: N/A;   KNEE ARTHROSCOPY Left 10/2008    Meniscus Repair   KNEE ARTHROSCOPY W/ ACL RECONSTRUCTION Left 1998   LEFT HEART CATHETERIZATION WITH CORONARY ANGIOGRAM N/A 08/11/2013   Procedure: LEFT HEART CATHETERIZATION WITH CORONARY ANGIOGRAM;  Surgeon: Pixie Casino, MD;  Location: [facility] CATH LAB;  Service: Cardiovascular;  Laterality: N/A;   LOWER EXTREMITY ANGIOGRAPHY Right 11/10/2020   Procedure: LOWER EXTREMITY ANGIOGRAPHY;  Surgeon: Algernon Huxley, MD;  Location: [facility] CV LAB;  Service: Cardiovascular;  Laterality: Right;   MENISCUS REPAIR Left 2001   TOTAL KNEE ARTHROPLASTY Left    UPPER GI ENDOSCOPY  10/2007   showed gastritis  Social History   Occupational History   Occupation: disability  Tobacco Use   Smoking status: Former    Packs/day: 0.50    Years: 20.00    Total pack years: 10.00    Types: Cigarettes    Quit date: 03/23/2016    Years since quitting: 6.2   Smokeless tobacco: Current    Types: Chew   Tobacco comments:     chews daily  Vaping Use   Vaping Use: Never used  Substance and Sexual Activity   Alcohol use: No    Comment: Recovered alcoholic   Drug use: No   Sexual activity: Not on file

## 2022-07-17 ENCOUNTER — Other Ambulatory Visit (INDEPENDENT_AMBULATORY_CARE_PROVIDER_SITE_OTHER): Payer: Self-pay | Admitting: Nurse Practitioner

## 2022-07-17 ENCOUNTER — Ambulatory Visit (INDEPENDENT_AMBULATORY_CARE_PROVIDER_SITE_OTHER): Payer: Medicare HMO | Admitting: Vascular Surgery

## 2022-07-17 ENCOUNTER — Ambulatory Visit (INDEPENDENT_AMBULATORY_CARE_PROVIDER_SITE_OTHER): Payer: Medicare HMO

## 2022-07-17 ENCOUNTER — Encounter (INDEPENDENT_AMBULATORY_CARE_PROVIDER_SITE_OTHER): Payer: Self-pay | Admitting: Vascular Surgery

## 2022-07-17 VITALS — BP 110/82 | HR 69 | Ht 71.0 in | Wt 234.0 lb

## 2022-07-17 DIAGNOSIS — E785 Hyperlipidemia, unspecified: Secondary | ICD-10-CM | POA: Diagnosis not present

## 2022-07-17 DIAGNOSIS — Z9889 Other specified postprocedural states: Secondary | ICD-10-CM | POA: Diagnosis not present

## 2022-07-17 DIAGNOSIS — E1142 Type 2 diabetes mellitus with diabetic polyneuropathy: Secondary | ICD-10-CM | POA: Diagnosis not present

## 2022-07-17 DIAGNOSIS — I739 Peripheral vascular disease, unspecified: Secondary | ICD-10-CM | POA: Diagnosis not present

## 2022-07-17 DIAGNOSIS — I10 Essential (primary) hypertension: Secondary | ICD-10-CM

## 2022-07-17 NOTE — Assessment & Plan Note (Signed)
blood glucose control important in reducing the progression of atherosclerotic disease. Also, involved in wound healing. On appropriate medications.  

## 2022-07-17 NOTE — Assessment & Plan Note (Addendum)
ABIs today are normal at 1.22 on the right and 1.12 on the left with multiphasic waveforms.  His previous ulcer has healed and he currently has no limb threatening symptoms or lifestyle limiting claudication.  Continue current medical regimen.  Recheck in 1 year.

## 2022-07-17 NOTE — Progress Notes (Signed)
MRN : [id number]  Grant [name] is a 51 y.o. (14-Feb-1971) male who presents with chief complaint of  Chief Complaint  Patient presents with   Follow-up  .  History of Present Illness: Patient returns today in follow up of his PAD.  Almost 2 years ago, he underwent right iliac stent placement for severe disease with nonhealing ulceration of the right foot.  His ulcer has long healed and he has no current ulceration, infection, rest pain, or lifestyle limiting claudication.  He is going to the gym.  He is losing weight and getting in better shape.  His diabetes has ravaged him in many regards. ABIs today are normal at 1.22 on the right and 1.12 on the left with multiphasic waveforms.  Of note, his right brachial blood pressure is lower than the left on the studies indicative of some proximal right upper extremity arterial stenosis.  He only has problems lifting his arm over his head and sounds more like a neuropathic issue on the right side.  He works out at [employer] and the arm does not tire in most positions.   Current Outpatient Medications  Medication Sig Dispense Refill   aspirin EC 81 MG tablet Take 1 tablet PO daily 150 tablet 2   atorvastatin (LIPITOR) 10 MG tablet      ibuprofen (ADVIL,MOTRIN) 200 MG tablet Take 200 mg by mouth every 6 (six) hours as needed for moderate pain or mild pain.     insulin aspart (NOVOLOG) 100 UNIT/ML injection Insulin pump     Insulin Human (INSULIN PUMP) SOLN Inject into the skin as directed.     losartan-hydrochlorothiazide (HYZAAR) 100-12.5 MG tablet Take 1 tablet by mouth at bedtime.     metoprolol succinate (TOPROL-XL) 50 MG 24 hr tablet Take 50 mg by mouth at bedtime.     Oxymetazoline HCl (NASAL SPRAY) 0.05 % SOLN Place into the nose as needed.     pregabalin (LYRICA) 200 MG capsule Take 200 mg by mouth 3 (three) times daily.     Skin Protectants, Misc. (EUCERIN) cream Apply topically as needed for dry skin. 454 g 0   traZODone (DESYREL) 100 MG  tablet Take 100 mg by mouth at bedtime.     zolpidem (AMBIEN) 10 MG tablet Take 10 mg by mouth at bedtime.     nitroGLYCERIN (NITRODUR - DOSED IN MG/24 HR) 0.2 mg/hr patch Place 1 patch (0.2 mg total) onto the skin daily. (Patient not taking: Reported on 07/17/2022) 30 patch 12   No current facility-administered medications for this visit.    Past Medical History:  Diagnosis Date   Alcohol abuse    Anxiety    Arteritic AION (anterior ischemic optic neuropathy)    Chronic low back pain 11/30/2015   Chronic pain    Depression    Diabetic peripheral neuropathy ([place]) 11/30/2015   Gastric ulcer    Hypertension    Lumbar radiculopathy    Neuropathy    Sleep apnea    Type 2 diabetes mellitus ([place])     Past Surgical History:  Procedure Laterality Date   AMPUTATION Right 12/07/2020   Procedure: RIGHT FOOT 5TH RAY AMPUTATION;  Surgeon: [name], MD;  Location: [facility];  Service: Orthopedics;  Laterality: Right;   back lipoma resection  03/2006   BIOPSY  06/15/2018   Procedure: BIOPSY;  Surgeon: Jerene Bears, MD;  Location: [facility] ENDOSCOPY;  Service: Gastroenterology;;   CARDIAC CATHETERIZATION     ESOPHAGOGASTRODUODENOSCOPY (EGD)  WITH PROPOFOL N/A 06/15/2018   Procedure: ESOPHAGOGASTRODUODENOSCOPY (EGD) WITH PROPOFOL;  Surgeon: Jerene Bears, MD;  Location[facility] ENDOSCOPY;  Service: Gastroenterology;  Laterality: N/A;   KNEE ARTHROSCOPY Left 10/2008   Meniscus Repair   KNEE ARTHROSCOPY W/ ACL RECONSTRUCTION Left 1998   LEFT HEART CATHETERIZATION WITH CORONARY ANGIOGRAM N/A 08/11/2013   Procedure: LEFT HEART CATHETERIZATION WITH CORONARY ANGIOGRAM;  Surgeon: Pixie Casino, MD;  Location: [facility] CATH LAB;  Service: Cardiovascular;  Laterality: N/A;   LOWER EXTREMITY ANGIOGRAPHY Right 11/10/2020   Procedure: LOWER EXTREMITY ANGIOGRAPHY;  Surgeon: Algernon Huxley, MD;  Location: [facility] CV LAB;  Service: Cardiovascular;  Laterality: Right;   MENISCUS REPAIR Left 2001   TOTAL KNEE  ARTHROPLASTY Left    UPPER GI ENDOSCOPY  10/2007   showed gastritis     Social History   Tobacco Use   Smoking status: Former    Packs/day: 0.50    Years: 20.00    Total pack years: 10.00    Types: Cigarettes    Quit date: 03/23/2016    Years since quitting: 6.3   Smokeless tobacco: Current    Types: Chew   Tobacco comments:     chews daily  Vaping Use   Vaping Use: Never used  Substance Use Topics   Alcohol use: No    Comment: Recovered alcoholic   Drug use: No      Family History  Problem Relation Age of Onset   Diabetes Mother    Hypertension Mother    Esophageal cancer Father    Hypertension Father    Hyperlipidemia Father    Throat cancer Father    Alcohol abuse Brother    Brain cancer Maternal Grandfather    Stomach cancer Paternal Grandfather    Colon cancer Neg Hx    Rectal cancer Neg Hx      Allergies  Allergen Reactions   Codeine Nausea And Vomiting   Cymbalta [Duloxetine Hcl]     Other reaction(s): worsened depression   Duloxetine     Other reaction(s): Other (See Comments)   Glucophage [Metformin]     Other reaction(s): diarrhea   Lexapro [Escitalopram]     Other reaction(s): nausea   Metformin Hcl Diarrhea and Nausea Only   Ozempic (0.25 Or 0.5 Mg-Dose) [Semaglutide(0.25 Or 0.'5mg'$ -Dos)]     Other reaction(s): vomiting   Tylox [Oxycodone-Acetaminophen] Nausea And Vomiting     REVIEW OF SYSTEMS (Negative unless checked)  Constitutional: '[x]'$ Weight loss  '[]'$ Fever  '[]'$ Chills Cardiac: '[]'$ Chest pain   '[]'$ Chest pressure   '[]'$ Palpitations   '[]'$ Shortness of breath when laying flat   '[]'$ Shortness of breath at rest   '[]'$ Shortness of breath with exertion. Vascular:  '[]'$ Pain in legs with walking   '[]'$ Pain in legs at rest   '[]'$ Pain in legs when laying flat   '[]'$ Claudication   '[]'$ Pain in feet when walking  '[]'$ Pain in feet at rest  '[]'$ Pain in feet when laying flat   '[]'$ History of DVT   '[]'$ Phlebitis   '[]'$ Swelling in legs   '[]'$ Varicose veins   '[]'$ Non-healing ulcers Pulmonary:    '[]'$ Uses home oxygen   '[]'$ Productive cough   '[]'$ Hemoptysis   '[]'$ Wheeze  '[]'$ COPD   '[]'$ Asthma Neurologic:  '[]'$ Dizziness  '[]'$ Blackouts   '[]'$ Seizures   '[]'$ History of stroke   '[]'$ History of TIA  '[]'$ Aphasia   '[]'$ Temporary blindness   '[]'$ Dysphagia   '[x]'$ Weakness or numbness in arms   '[x]'$ Weakness or numbness in legs Musculoskeletal:  '[]'$ Arthritis   '[]'$ Joint swelling   '[]'$ Joint pain   '[]'$   Low back pain Hematologic:  '[]'$ Easy bruising  '[]'$ Easy bleeding   '[]'$ Hypercoagulable state   '[]'$ Anemic   Gastrointestinal:  '[]'$ Blood in stool   '[]'$ Vomiting blood  '[]'$ Gastroesophageal reflux/heartburn   '[]'$ Abdominal pain Genitourinary:  '[]'$ Chronic kidney disease   '[]'$ Difficult urination  '[]'$ Frequent urination  '[]'$ Burning with urination   '[]'$ Hematuria Skin:  '[]'$ Rashes   '[]'$ Ulcers   '[]'$ Wounds Psychological:  '[]'$ History of anxiety   '[]'$  History of major depression.  Physical Examination  BP 110/82   Pulse 69   Ht '5\' 11"'$  (1.803 m)   Wt 234 lb (106.1 kg)   BMI 32.64 kg/m  Gen:  WD/WN, NAD. Appears older than stated age. Head: [place]/AT, No temporalis wasting. Ear/Nose/Throat: Hearing grossly intact, nares w/o erythema or drainage Eyes: Conjunctiva clear. Sclera non-icteric Neck: Supple.  Trachea midline Pulmonary:  Good air movement, no use of accessory muscles.  Cardiac: RRR, no JVD Vascular:  Vessel Right Left  Radial Palpable Palpable                          PT Palpable Palpable  DP Palpable Palpable   Gastrointestinal: soft, non-tender/non-distended. No guarding/reflex.  Musculoskeletal: M/S 5/5 throughout.  No deformity or atrophy. No edema. Neurologic: Sensation grossly intact in extremities.  Symmetrical.  Speech is fluent.  Psychiatric: Judgment intact, Mood & affect appropriate for pt's clinical situation. Dermatologic: No rashes or ulcers noted.  No cellulitis or open wounds.      Labs No results found for this or any previous visit (from the past 2160 hour(s)).  Radiology XR Foot Complete Right  Result Date:  06/27/2022 Three-view radiographs of the right foot shows calcification of the arteries out to the forefoot with stable healing of the stress fracture fourth metatarsal shaft.   Assessment/Plan  PAD (peripheral artery disease) (HCC) blood pressure control important in reducing the progression of atherosclerotic disease. On appropriate oral medications.   Type 2 diabetes mellitus with peripheral neuropathy (HCC) blood glucose control important in reducing the progression of atherosclerotic disease. Also, involved in wound healing. On appropriate medications.   Hyperlipidemia lipid control important in reducing the progression of atherosclerotic disease. Continue statin therapy    Leotis Pain, MD  07/17/2022 2:25 PM    This note was created with Dragon medical transcription system.  Any errors from dictation are purely unintentional

## 2022-07-17 NOTE — Assessment & Plan Note (Signed)
blood pressure control important in reducing the progression of atherosclerotic disease. On appropriate oral medications.  

## 2022-07-17 NOTE — Assessment & Plan Note (Signed)
lipid control important in reducing the progression of atherosclerotic disease. Continue statin therapy  

## 2022-08-06 DIAGNOSIS — E1165 Type 2 diabetes mellitus with hyperglycemia: Secondary | ICD-10-CM | POA: Diagnosis not present

## 2022-08-13 DIAGNOSIS — Z9641 Presence of insulin pump (external) (internal): Secondary | ICD-10-CM | POA: Diagnosis not present

## 2022-08-13 DIAGNOSIS — I10 Essential (primary) hypertension: Secondary | ICD-10-CM | POA: Diagnosis not present

## 2022-08-13 DIAGNOSIS — E1140 Type 2 diabetes mellitus with diabetic neuropathy, unspecified: Secondary | ICD-10-CM | POA: Diagnosis not present

## 2022-08-13 DIAGNOSIS — E7800 Pure hypercholesterolemia, unspecified: Secondary | ICD-10-CM | POA: Diagnosis not present

## 2022-08-13 DIAGNOSIS — E1165 Type 2 diabetes mellitus with hyperglycemia: Secondary | ICD-10-CM | POA: Diagnosis not present

## 2022-08-23 ENCOUNTER — Ambulatory Visit: Payer: Medicare HMO | Admitting: Podiatry

## 2022-08-23 DIAGNOSIS — M21969 Unspecified acquired deformity of unspecified lower leg: Secondary | ICD-10-CM

## 2022-08-23 DIAGNOSIS — M79674 Pain in right toe(s): Secondary | ICD-10-CM | POA: Diagnosis not present

## 2022-08-23 DIAGNOSIS — E1142 Type 2 diabetes mellitus with diabetic polyneuropathy: Secondary | ICD-10-CM

## 2022-08-23 DIAGNOSIS — M2142 Flat foot [pes planus] (acquired), left foot: Secondary | ICD-10-CM

## 2022-08-23 DIAGNOSIS — Z89421 Acquired absence of other right toe(s): Secondary | ICD-10-CM | POA: Diagnosis not present

## 2022-08-23 DIAGNOSIS — M79675 Pain in left toe(s): Secondary | ICD-10-CM | POA: Diagnosis not present

## 2022-08-23 DIAGNOSIS — B353 Tinea pedis: Secondary | ICD-10-CM | POA: Diagnosis not present

## 2022-08-23 DIAGNOSIS — L603 Nail dystrophy: Secondary | ICD-10-CM | POA: Diagnosis not present

## 2022-08-23 DIAGNOSIS — B351 Tinea unguium: Secondary | ICD-10-CM | POA: Diagnosis not present

## 2022-08-23 DIAGNOSIS — M2141 Flat foot [pes planus] (acquired), right foot: Secondary | ICD-10-CM | POA: Diagnosis not present

## 2022-08-23 MED ORDER — CLOTRIMAZOLE-BETAMETHASONE 1-0.05 % EX CREA
1.0000 | TOPICAL_CREAM | Freq: Every day | CUTANEOUS | 0 refills | Status: DC
Start: 2022-08-23 — End: 2022-12-07

## 2022-08-25 NOTE — Progress Notes (Addendum)
  Subjective:  Patient ID: Grant [name], male    DOB: 1970/12/23,  MRN: [id number]  Chief Complaint  Patient presents with   Diabetes    Diabetic foot exam.  Interested in diabetic shoes   Rash    Thick and dry scaling skin on feet   Nail Problem    Thickened discolored crumbly nails    51 y.o. male presents with the above complaint. History confirmed with patient.  His A1c used to be severely uncontrolled is looking much better now.  He has lost quite a bit of weight which is helped.  He is now 3 years sober.  He does have significant numbness in his feet  Objective:  Physical Exam: warm, good capillary refill, no trophic changes or ulcerative lesions, normal DP and PT pulses, abnormal sensory exam, and dry scaling rash around plantar feet, medial heel, and between toes, thickened elongated yellow crumbly discolored nails x2.   Assessment:   1. Nail dystrophy   2. Tinea pedis of both feet   3. Type 2 diabetes mellitus with peripheral neuropathy (HCC)   4. Pain due to onychomycosis of toenails of both feet   5. Pes planus of both feet   6. History of partial ray amputation of fifth toe of right foot ([place])   7. Acquired foot deformity, unspecified laterality      Plan:  Patient was evaluated and treated and all questions answered.   Patient educated on diabetes. Discussed proper diabetic foot care and discussed risks and complications of disease. Educated patient in depth on reasons to return to the office immediately should he/she discover anything concerning or new on the feet. All questions answered. Discussed proper shoes as well.   Discussed the etiology and treatment options for the condition in detail with the patient. Educated patient on the topical and oral treatment options for mycotic nails. Recommended debridement of the nails today. Sharp and mechanical debridement performed of all painful and mycotic nails today. Nails debrided in length and thickness using a nail  nipper to level of comfort. Discussed treatment options including appropriate shoe gear. Follow up as needed for painful nails.  No culture was taken.  I will let him know the results of the show   Discussed the etiology and treatment options for tinea pedis.  Discussed topical and oral treatment.  Recommended topical treatment with Lotrisone cream.  This was sent to the patient's pharmacy.  Also discussed appropriate foot hygiene, use of antifungal spray such as Tinactin in shoes, as well as cleaning foot surfaces such as showers and bathroom floors with bleach.   No follow-ups on file.

## 2022-09-07 DIAGNOSIS — G4700 Insomnia, unspecified: Secondary | ICD-10-CM | POA: Diagnosis not present

## 2022-09-07 DIAGNOSIS — Z794 Long term (current) use of insulin: Secondary | ICD-10-CM | POA: Diagnosis not present

## 2022-09-07 DIAGNOSIS — I739 Peripheral vascular disease, unspecified: Secondary | ICD-10-CM | POA: Diagnosis not present

## 2022-09-07 DIAGNOSIS — F1011 Alcohol abuse, in remission: Secondary | ICD-10-CM | POA: Diagnosis not present

## 2022-09-07 DIAGNOSIS — I10 Essential (primary) hypertension: Secondary | ICD-10-CM | POA: Diagnosis not present

## 2022-09-07 DIAGNOSIS — I998 Other disorder of circulatory system: Secondary | ICD-10-CM | POA: Diagnosis not present

## 2022-09-07 DIAGNOSIS — E1140 Type 2 diabetes mellitus with diabetic neuropathy, unspecified: Secondary | ICD-10-CM | POA: Diagnosis not present

## 2022-09-07 DIAGNOSIS — M542 Cervicalgia: Secondary | ICD-10-CM | POA: Diagnosis not present

## 2022-09-07 DIAGNOSIS — I2510 Atherosclerotic heart disease of native coronary artery without angina pectoris: Secondary | ICD-10-CM | POA: Diagnosis not present

## 2022-09-17 ENCOUNTER — Encounter: Payer: Self-pay | Admitting: Podiatry

## 2022-09-17 MED ORDER — CICLOPIROX 8 % EX SOLN: Freq: Every day | CUTANEOUS | 0 refills | Status: DC

## 2022-09-24 ENCOUNTER — Encounter (INDEPENDENT_AMBULATORY_CARE_PROVIDER_SITE_OTHER): Payer: Self-pay

## 2022-10-03 ENCOUNTER — Encounter: Payer: Self-pay | Admitting: Podiatry

## 2022-10-03 ENCOUNTER — Ambulatory Visit (INDEPENDENT_AMBULATORY_CARE_PROVIDER_SITE_OTHER): Payer: Medicare HMO

## 2022-10-03 DIAGNOSIS — E1142 Type 2 diabetes mellitus with diabetic polyneuropathy: Secondary | ICD-10-CM

## 2022-10-03 DIAGNOSIS — M2141 Flat foot [pes planus] (acquired), right foot: Secondary | ICD-10-CM

## 2022-10-03 DIAGNOSIS — M2142 Flat foot [pes planus] (acquired), left foot: Secondary | ICD-10-CM

## 2022-10-03 NOTE — Progress Notes (Signed)
Patient presents to the office today with issues concerning the diabetic shoes picked up on 10/03/22.   The shoes feel too tight.  We will send the New balance brand, style [id number], size 12.5 medium back.   Reorder: [employer] Balance [id number] size 12.5 wide men's.  Patient kept insoles for other shoes. Advised to bring back a pair to check fit of reorder.  Patient will be notified for a fitting appointment once the shoes arrive in office.

## 2022-10-09 ENCOUNTER — Ambulatory Visit: Payer: Medicare HMO | Admitting: *Deleted

## 2022-10-09 NOTE — Progress Notes (Signed)
Pt presented to office for an orthotic adjustment. Pt states that his wife noticed a spot/bruise on the bottom of his right foot. Because I did not see a bruise I recommended that the patient be seen by Dr Sherryle Lis for further instruction.

## 2022-10-16 ENCOUNTER — Ambulatory Visit (INDEPENDENT_AMBULATORY_CARE_PROVIDER_SITE_OTHER): Payer: Medicare HMO | Admitting: Vascular Surgery

## 2022-10-16 ENCOUNTER — Encounter (INDEPENDENT_AMBULATORY_CARE_PROVIDER_SITE_OTHER): Payer: Self-pay | Admitting: Vascular Surgery

## 2022-10-16 VITALS — BP 105/78 | HR 71 | Resp 18 | Ht 71.5 in | Wt 227.4 lb

## 2022-10-16 DIAGNOSIS — I739 Peripheral vascular disease, unspecified: Secondary | ICD-10-CM

## 2022-10-16 DIAGNOSIS — I10 Essential (primary) hypertension: Secondary | ICD-10-CM

## 2022-10-16 DIAGNOSIS — E1142 Type 2 diabetes mellitus with diabetic polyneuropathy: Secondary | ICD-10-CM

## 2022-10-16 DIAGNOSIS — E785 Hyperlipidemia, unspecified: Secondary | ICD-10-CM | POA: Diagnosis not present

## 2022-10-16 DIAGNOSIS — I998 Other disorder of circulatory system: Secondary | ICD-10-CM

## 2022-10-16 NOTE — Assessment & Plan Note (Signed)
blood pressure control important in reducing the progression of atherosclerotic disease. On appropriate oral medications.  

## 2022-10-16 NOTE — Progress Notes (Signed)
MRN : [id number]  Grant [name] is a 51 y.o. (09/25/1971) male who presents with chief complaint of No chief complaint on file. Marland Kitchen  History of Present Illness: Patient returns today in follow up of multiple vascular issues.  He was seen about 3 months ago for his lower extremity peripheral arterial disease and it was noted at that time he had asymmetric blood pressures with the right blood pressure being about 40 points lower than the left.  He has had intervention for his lower extremity arterial disease and this is doing well.  No current disabling claudication, ischemic rest pain, or ulceration.  He has study scheduled for early next year. As for his asymmetric blood pressures, we had discussed this with him previously somewhat as well.  He describes many of his symptoms in the right arm to be positional in terms of numbness or weakness in the hand and fingers.  He says the right arm does tire a bit easier than the left and may not be as strong as the left.  He reports a fall with a trauma about 10 years ago on his right side and has had a lot of pain in his neck.  He has not had that evaluated by neurosurgeon or orthopedic surgeon.  No ulceration or infection.  No real pain with normal activities in his right arm.  Significant neuropathy is present both in the arms and the legs which complicates things.  No previous history of stroke or TIA.  Current Outpatient Medications  Medication Sig Dispense Refill   aspirin EC 81 MG tablet Take 1 tablet PO daily 150 tablet 2   atorvastatin (LIPITOR) 10 MG tablet      ciclopirox (PENLAC) 8 % solution Apply topically at bedtime. Apply over nail and surrounding skin. Apply daily over previous coat. After seven (7) days, may remove with alcohol and continue cycle. 6.6 mL 0   clotrimazole-betamethasone (LOTRISONE) cream Apply 1 Application topically daily. 60 g 0   ibuprofen (ADVIL,MOTRIN) 200 MG tablet Take 200 mg by mouth every 6 (six) hours as needed for  moderate pain or mild pain.     insulin aspart (NOVOLOG) 100 UNIT/ML injection Insulin pump     Insulin Human (INSULIN PUMP) SOLN Inject into the skin as directed.     losartan-hydrochlorothiazide (HYZAAR) 100-12.5 MG tablet Take 1 tablet by mouth at bedtime.     metoprolol succinate (TOPROL-XL) 50 MG 24 hr tablet Take 50 mg by mouth at bedtime.     Oxymetazoline HCl (NASAL SPRAY) 0.05 % SOLN Place into the nose as needed.     pregabalin (LYRICA) 200 MG capsule Take 200 mg by mouth 3 (three) times daily.     Skin Protectants, Misc. (EUCERIN) cream Apply topically as needed for dry skin. 454 g 0   traZODone (DESYREL) 100 MG tablet Take 100 mg by mouth at bedtime.     zolpidem (AMBIEN) 10 MG tablet Take 10 mg by mouth at bedtime.     No current facility-administered medications for this visit.    Past Medical History:  Diagnosis Date   Alcohol abuse    Anxiety    Arteritic AION (anterior ischemic optic neuropathy)    Chronic low back pain 11/30/2015   Chronic pain    Depression    Diabetic peripheral neuropathy ([place]) 11/30/2015   Gastric ulcer    Hypertension    Lumbar radiculopathy    Neuropathy    Sleep apnea    Type 2  diabetes mellitus [facility])     Past Surgical History:  Procedure Laterality Date   AMPUTATION Right 12/07/2020   Procedure: RIGHT FOOT 5TH RAY AMPUTATION;  Surgeon: [name], MD;  Location: [place];  Service: Orthopedics;  Laterality: Right;   back lipoma resection  03/2006   BIOPSY  06/15/2018   Procedure: BIOPSY;  Surgeon: Jerene Bears, MD;  Location: [facility] ENDOSCOPY;  Service: Gastroenterology;;   CARDIAC CATHETERIZATION     ESOPHAGOGASTRODUODENOSCOPY (EGD) WITH PROPOFOL N/A 06/15/2018   Procedure: ESOPHAGOGASTRODUODENOSCOPY (EGD) WITH PROPOFOL;  Surgeon: Jerene Bears, MD;  Location: [facility] ENDOSCOPY;  Service: Gastroenterology;  Laterality: N/A;   KNEE ARTHROSCOPY Left 10/2008   Meniscus Repair   KNEE ARTHROSCOPY W/ ACL RECONSTRUCTION Left 1998   LEFT HEART  CATHETERIZATION WITH CORONARY ANGIOGRAM N/A 08/11/2013   Procedure: LEFT HEART CATHETERIZATION WITH CORONARY ANGIOGRAM;  Surgeon: Pixie Casino, MD;  Location: [facility] CATH LAB;  Service: Cardiovascular;  Laterality: N/A;   LOWER EXTREMITY ANGIOGRAPHY Right 11/10/2020   Procedure: LOWER EXTREMITY ANGIOGRAPHY;  Surgeon: Algernon Huxley, MD;  Location: [place] CV LAB;  Service: Cardiovascular;  Laterality: Right;   MENISCUS REPAIR Left 2001   TOTAL KNEE ARTHROPLASTY Left    UPPER GI ENDOSCOPY  10/2007   showed gastritis     Social History   Tobacco Use   Smoking status: Former    Packs/day: 0.50    Years: 20.00    Total pack years: 10.00    Types: Cigarettes    Quit date: 03/23/2016    Years since quitting: 6.5   Smokeless tobacco: Current    Types: Chew   Tobacco comments:     chews daily  Vaping Use   Vaping Use: Never used  Substance Use Topics   Alcohol use: No    Comment: Recovered alcoholic   Drug use: No      Family History  Problem Relation Age of Onset   Diabetes Mother    Hypertension Mother    Esophageal cancer Father    Hypertension Father    Hyperlipidemia Father    Throat cancer Father    Alcohol abuse Brother    Brain cancer Maternal Grandfather    Stomach cancer Paternal Grandfather    Colon cancer Neg Hx    Rectal cancer Neg Hx      Allergies  Allergen Reactions   Codeine Nausea And Vomiting   Cymbalta [Duloxetine Hcl]     Other reaction(s): worsened depression   Duloxetine     Other reaction(s): Other (See Comments)   Glucophage [Metformin]     Other reaction(s): diarrhea   Lexapro [Escitalopram]     Other reaction(s): nausea   Metformin Hcl Diarrhea and Nausea Only   Ozempic (0.25 Or 0.5 Mg-Dose) [Semaglutide(0.25 Or 0.'5mg'$ -Dos)]     Other reaction(s): vomiting   Tylox [Oxycodone-Acetaminophen] Nausea And Vomiting    REVIEW OF SYSTEMS (Negative unless checked)   Constitutional: '[x]'$ Weight loss  '[]'$ Fever  '[]'$ Chills Cardiac: '[]'$ Chest  pain   '[]'$ Chest pressure   '[]'$ Palpitations   '[]'$ Shortness of breath when laying flat   '[]'$ Shortness of breath at rest   '[]'$ Shortness of breath with exertion. Vascular:  '[]'$ Pain in legs with walking   '[]'$ Pain in legs at rest   '[]'$ Pain in legs when laying flat   '[]'$ Claudication   '[]'$ Pain in feet when walking  '[]'$ Pain in feet at rest  '[]'$ Pain in feet when laying flat   '[]'$ History of DVT   '[]'$ Phlebitis   '[]'$ Swelling in legs   '[]'$   Varicose veins   '[]'$ Non-healing ulcers Pulmonary:   '[]'$ Uses home oxygen   '[]'$ Productive cough   '[]'$ Hemoptysis   '[]'$ Wheeze  '[]'$ COPD   '[]'$ Asthma Neurologic:  '[]'$ Dizziness  '[]'$ Blackouts   '[]'$ Seizures   '[]'$ History of stroke   '[]'$ History of TIA  '[]'$ Aphasia   '[]'$ Temporary blindness   '[]'$ Dysphagia   '[x]'$ Weakness or numbness in arms   '[x]'$ Weakness or numbness in legs Musculoskeletal:  '[]'$ Arthritis   '[]'$ Joint swelling   '[]'$ Joint pain   '[]'$ Low back pain Hematologic:  '[]'$ Easy bruising  '[]'$ Easy bleeding   '[]'$ Hypercoagulable state   '[]'$ Anemic   Gastrointestinal:  '[]'$ Blood in stool   '[]'$ Vomiting blood  '[]'$ Gastroesophageal reflux/heartburn   '[]'$ Abdominal pain Genitourinary:  '[]'$ Chronic kidney disease   '[]'$ Difficult urination  '[]'$ Frequent urination  '[]'$ Burning with urination   '[]'$ Hematuria Skin:  '[]'$ Rashes   '[]'$ Ulcers   '[]'$ Wounds Psychological:  '[]'$ History of anxiety   '[]'$  History of major depression.  Physical Examination  BP 105/78 (BP Location: Right Arm)   Pulse 71   Resp 18   Ht 5' 11.5" (1.816 m)   Wt 227 lb 6.4 oz (103.1 kg)   BMI 31.27 kg/m  Gen:  WD/WN, NAD. Appears older than stated age. Head: [place]/AT, No temporalis wasting. Ear/Nose/Throat: Hearing grossly intact, nares w/o erythema or drainage Eyes: Conjunctiva clear. Sclera non-icteric Neck: Supple.  Trachea midline Pulmonary:  Good air movement, no use of accessory muscles.  Cardiac: RRR, no JVD Vascular:  Vessel Right Left  Radial 1+ Palpable 2+ Palpable                          PT Palpable Palpable  DP Palpable Palpable   Gastrointestinal: soft,  non-tender/non-distended. No guarding/reflex.  Musculoskeletal: M/S 5/5 throughout.  No deformity or atrophy.  Trace lower extremity edema. Neurologic: Sensation grossly intact in extremities.  Symmetrical.  Speech is fluent.  Psychiatric: Judgment intact, Mood & affect appropriate for pt's clinical situation. Dermatologic: No rashes or ulcers noted.  No cellulitis or open wounds.      Labs No results found for this or any previous visit (from the past 2160 hour(s)).  Radiology No results found.  Assessment/Plan  Type 2 diabetes mellitus with peripheral neuropathy (HCC) blood glucose control important in reducing the progression of atherosclerotic disease. Also, involved in wound healing. On appropriate medications.     Hyperlipidemia lipid control important in reducing the progression of atherosclerotic disease. Continue statin therapy  Essential hypertension blood pressure control important in reducing the progression of atherosclerotic disease. On appropriate oral medications.   Asymmetric blood pressures This would generally be indicative of some degree of right subclavian/axillary/brachial stenosis or occlusion. I am not clear his symptoms are referable to this and he has a lot of neck pain too.  I am going to obtain a carotid duplex which should give [place] some idea about his subclavian and carotid flow as well as whether or not his vertebral arteries are reversed.  I am also going to refer him to neurosurgery for the neck pain as I think some of his arm symptoms are neuropathic in nature.  Will see him back with his noninvasive studies in the near future at his convenience.  PAD (peripheral artery disease) (HCC) Lower extremity seem to be doing well after revascularization.  To be checked early next year.    Leotis Pain, MD  10/16/2022 4:30 PM    This note was created with Dragon medical transcription system.  Any errors from dictation are purely  unintentional

## 2022-10-16 NOTE — Assessment & Plan Note (Addendum)
This would generally be indicative of some degree of right subclavian/axillary/brachial stenosis or occlusion. I am not clear his symptoms are referable to this and he has a lot of neck pain too.  I am going to obtain a carotid duplex which should give [place] some idea about his subclavian and carotid flow as well as whether or not his vertebral arteries are reversed.  I am also going to refer him to neurosurgery for the neck pain as I think some of his arm symptoms are neuropathic in nature.  Will see him back with his noninvasive studies in the near future at his convenience.

## 2022-10-16 NOTE — Assessment & Plan Note (Signed)
Lower extremity seem to be doing well after revascularization.  To be checked early next year.

## 2022-10-23 DIAGNOSIS — Z96652 Presence of left artificial knee joint: Secondary | ICD-10-CM | POA: Diagnosis not present

## 2022-10-24 ENCOUNTER — Ambulatory Visit (INDEPENDENT_AMBULATORY_CARE_PROVIDER_SITE_OTHER): Payer: Medicare HMO

## 2022-10-24 DIAGNOSIS — M2141 Flat foot [pes planus] (acquired), right foot: Secondary | ICD-10-CM

## 2022-10-24 DIAGNOSIS — E1142 Type 2 diabetes mellitus with diabetic polyneuropathy: Secondary | ICD-10-CM

## 2022-10-24 DIAGNOSIS — M2142 Flat foot [pes planus] (acquired), left foot: Secondary | ICD-10-CM

## 2022-10-24 NOTE — Progress Notes (Signed)
Patient presents to the office today with issues concerning the diabetic shoes picked up on 10/24/22.   The shoes feel too tight.  We will send the [employer] brand, style [id number], size 11.5 2E back.   Reorder: [employer] [id number] 12 2 E  Patient kept insoles for other shoes. Advised to bring back a pair to check fit of reorder.  Patient will be notified for a fitting appointment once the shoes arrive in office.

## 2022-10-25 DIAGNOSIS — E1165 Type 2 diabetes mellitus with hyperglycemia: Secondary | ICD-10-CM | POA: Diagnosis not present

## 2022-11-06 ENCOUNTER — Other Ambulatory Visit: Payer: Medicare HMO

## 2022-11-09 ENCOUNTER — Ambulatory Visit (INDEPENDENT_AMBULATORY_CARE_PROVIDER_SITE_OTHER): Payer: Medicare HMO | Admitting: Nurse Practitioner

## 2022-11-09 ENCOUNTER — Ambulatory Visit (INDEPENDENT_AMBULATORY_CARE_PROVIDER_SITE_OTHER): Payer: Medicare HMO

## 2022-11-09 ENCOUNTER — Encounter (INDEPENDENT_AMBULATORY_CARE_PROVIDER_SITE_OTHER): Payer: Self-pay | Admitting: Nurse Practitioner

## 2022-11-09 VITALS — BP 108/75 | HR 67 | Resp 16 | Wt 232.8 lb

## 2022-11-09 DIAGNOSIS — M542 Cervicalgia: Secondary | ICD-10-CM | POA: Diagnosis not present

## 2022-11-09 DIAGNOSIS — Z9889 Other specified postprocedural states: Secondary | ICD-10-CM | POA: Diagnosis not present

## 2022-11-09 DIAGNOSIS — I10 Essential (primary) hypertension: Secondary | ICD-10-CM | POA: Diagnosis not present

## 2022-11-09 DIAGNOSIS — I771 Stricture of artery: Secondary | ICD-10-CM

## 2022-11-09 DIAGNOSIS — I739 Peripheral vascular disease, unspecified: Secondary | ICD-10-CM

## 2022-11-09 DIAGNOSIS — I998 Other disorder of circulatory system: Secondary | ICD-10-CM | POA: Diagnosis not present

## 2022-11-09 NOTE — Progress Notes (Signed)
Subjective:    Patient ID: Grant [name], male    DOB: [birth year], 51 y.o.   MRN: [id number] Chief Complaint  Patient presents with   Follow-up    Ultrasound follow up    Grant [name] is a 51 year old male who returns today for evaluation of asymmetrical blood pressures with the right blood pressure being lower.  He notes that his symptoms in his right arm can be positional in terms of numbness or weakness in the hands and fingers.  He notes that it is worse if they are raised or elevated.  He also notes that he has some numbness or weakness in the hands and fingers if he is having sustained activity such as with brushing his teeth or shaving.  He also does report having a fall and trauma approximately 10 years ago on his right side and he was held in a persistent position for a extended amount of time.  He has never had any follow-up for this.  Currently there is no open wounds or limb threatening symptoms.  He notes that while the pain is there it is not necessarily disabling for his day-to-day activities.  He currently denies any dizziness today the patient has a 1 to 39% stenosis in his right ICA with a 40 to 59% stenosis in the left.  The patient does have a noted right subclavian artery which is stenotic.  However the bilateral vertebral arteries have antegrade flow.      Review of Systems  Neurological:  Positive for weakness and numbness. Negative for dizziness.  All other systems reviewed and are negative.      Objective:   Physical Exam Vitals reviewed.  HENT:     Head: Normocephalic.  Cardiovascular:     Rate and Rhythm: Normal rate.     Pulses:          Dorsalis pedis pulses are 1+ on the right side and 1+ on the left side.  Pulmonary:     Effort: Pulmonary effort is normal.  Skin:    General: Skin is warm and dry.  Neurological:     Mental Status: He is alert and oriented to person, place, and time.  Psychiatric:        Mood and Affect: Mood normal.         Behavior: Behavior normal.        Thought Content: Thought content normal.        Judgment: Judgment normal.     BP 108/75 (BP Location: Right Arm)   Pulse 67   Resp 16   Wt 232 lb 12.8 oz (105.6 kg)   BMI 32.02 kg/m   Past Medical History:  Diagnosis Date   Alcohol abuse    Anxiety    Arteritic AION (anterior ischemic optic neuropathy)    Chronic low back pain 11/30/2015   Chronic pain    Depression    Diabetic peripheral neuropathy ([place]) 11/30/2015   Gastric ulcer    Hypertension    Lumbar radiculopathy    Neuropathy    Sleep apnea    Type 2 diabetes mellitus ([place])     Social History   Socioeconomic History   Marital status: Married    Spouse name: Not on file   Number of children: 2   Years of education: college   Highest education level: Not on file  Occupational History   Occupation: disability  Tobacco Use   Smoking status: Former    Packs/day: 0.50  Years: 20.00    Total pack years: 10.00    Types: Cigarettes    Quit date: 03/23/2016    Years since quitting: 6.6   Smokeless tobacco: Current    Types: Chew   Tobacco comments:     chews daily  Vaping Use   Vaping Use: Never used  Substance and Sexual Activity   Alcohol use: No    Comment: Recovered alcoholic   Drug use: No   Sexual activity: Not on file  Other Topics Concern   Not on file  Social History Narrative   Patient drinks 1 cup of caffeine daily.   Patient is right handed.    Social Determinants of Health   Financial Resource Strain: Not on file  Food Insecurity: Not on file  Transportation Needs: Not on file  Physical Activity: Not on file  Stress: Not on file  Social Connections: Not on file  Intimate Partner Violence: Not on file    Past Surgical History:  Procedure Laterality Date   AMPUTATION Right 12/07/2020   Procedure: RIGHT FOOT 5TH RAY AMPUTATION;  Surgeon: [name], MD;  Location: [place];  Service: Orthopedics;  Laterality: Right;   back lipoma resection   03/2006   BIOPSY  06/15/2018   Procedure: BIOPSY;  Surgeon: Jerene Bears, MD;  Location: [facility] ENDOSCOPY;  Service: Gastroenterology;;   CARDIAC CATHETERIZATION     ESOPHAGOGASTRODUODENOSCOPY (EGD) WITH PROPOFOL N/A 06/15/2018   Procedure: ESOPHAGOGASTRODUODENOSCOPY (EGD) WITH PROPOFOL;  Surgeon: Jerene Bears, MD;  Location: [facility] ENDOSCOPY;  Service: Gastroenterology;  Laterality: N/A;   KNEE ARTHROSCOPY Left 10/2008   Meniscus Repair   KNEE ARTHROSCOPY W/ ACL RECONSTRUCTION Left 1998   LEFT HEART CATHETERIZATION WITH CORONARY ANGIOGRAM N/A 08/11/2013   Procedure: LEFT HEART CATHETERIZATION WITH CORONARY ANGIOGRAM;  Surgeon: Pixie Casino, MD;  Location: [facility] CATH LAB;  Service: Cardiovascular;  Laterality: N/A;   LOWER EXTREMITY ANGIOGRAPHY Right 11/10/2020   Procedure: LOWER EXTREMITY ANGIOGRAPHY;  Surgeon: Algernon Huxley, MD;  Location: [place] CV LAB;  Service: Cardiovascular;  Laterality: Right;   MENISCUS REPAIR Left 2001   TOTAL KNEE ARTHROPLASTY Left    UPPER GI ENDOSCOPY  10/2007   showed gastritis    Family History  Problem Relation Age of Onset   Diabetes Mother    Hypertension Mother    Esophageal cancer Father    Hypertension Father    Hyperlipidemia Father    Throat cancer Father    Alcohol abuse Brother    Brain cancer Maternal Grandfather    Stomach cancer Paternal Grandfather    Colon cancer Neg Hx    Rectal cancer Neg Hx     Allergies  Allergen Reactions   Codeine Nausea And Vomiting   Cymbalta [Duloxetine Hcl]     Other reaction(s): worsened depression   Duloxetine     Other reaction(s): Other (See Comments)   Glucophage [Metformin]     Other reaction(s): diarrhea   Lexapro [Escitalopram]     Other reaction(s): nausea   Metformin Hcl Diarrhea and Nausea Only   Ozempic (0.25 Or 0.5 Mg-Dose) [Semaglutide(0.25 Or 0.'5mg'$ -Dos)]     Other reaction(s): vomiting   Tylox [Oxycodone-Acetaminophen] Nausea And Vomiting       Latest Ref Rng & Units 12/07/2020    10:27 AM 08/01/2018    7:49 AM 07/30/2018    4:00 AM  CBC  WBC 4.0 - 10.5 K/uL 11.8  12.6  8.1   Hemoglobin 13.0 - 17.0 g/dL 14.5  15.9  13.2   Hematocrit 39.0 - 52.0 % 42.7  43.8  38.4   Platelets 150 - 400 K/uL 254  222  175       CMP     Component Value Date/Time   NA 135 12/07/2020 1027   NA 138 09/02/2013 1024   K 3.6 12/07/2020 1027   K 4.5 09/02/2013 1024   CL 100 12/07/2020 1027   CO2 23 12/07/2020 1027   CO2 26 09/02/2013 1024   GLUCOSE 195 (H) 12/07/2020 1027   GLUCOSE 261 (H) 09/02/2013 1024   BUN 8 12/07/2020 1027   BUN 9.1 09/02/2013 1024   CREATININE 0.69 12/07/2020 1027   CREATININE 0.8 09/02/2013 1024   CALCIUM 9.3 12/07/2020 1027   CALCIUM 9.9 09/02/2013 1024   PROT 7.4 08/01/2018 0749   PROT 7.2 11/30/2015 1010   PROT 7.4 09/02/2013 1024   ALBUMIN 4.5 08/01/2018 0749   ALBUMIN 4.2 09/02/2013 1024   AST 34 08/01/2018 0749   AST 12 09/02/2013 1024   ALT 22 08/01/2018 0749   ALT 17 09/02/2013 1024   ALKPHOS 69 08/01/2018 0749   ALKPHOS 67 09/02/2013 1024   BILITOT 1.5 (H) 08/01/2018 0749   BILITOT 0.90 09/02/2013 1024   GFRNONAA >60 12/07/2020 1027   GFRAA >60 08/01/2018 0749     No results found.     Assessment & Plan:   1. Peripheral arterial disease with history of revascularization ([place]) Recent noninvasive studies show that his recent revascularization is doing well.  We will have the patient follow-up in August.  2. Essential hypertension Continue antihypertensive medications as already ordered, these medications have been reviewed and there are no changes at this time.  3. Subclavian arterial stenosis (HCC) The patient does have evidence of subclavian stenosis on ultrasound and his symptoms are certainly consistent with some aspects of her subclavian stenosis, especially with description of claudication-like symptoms.  However at this time he notes that his symptoms are manageable.  Based on this we will have the patient follow-up  closely.  4. Neck pain While the patient does have some noted subclavian stenosis, I suspect that not all of his symptoms are directly related to this.  He had an injury several years ago and I suspect that some of his pain may be neuropathic in nature.  Will refer the patient to neurosurgery for evaluation as well.   Current Outpatient Medications on File Prior to Visit  Medication Sig Dispense Refill   aspirin EC 81 MG tablet Take 1 tablet PO daily 150 tablet 2   atorvastatin (LIPITOR) 10 MG tablet      ciclopirox (PENLAC) 8 % solution Apply topically at bedtime. Apply over nail and surrounding skin. Apply daily over previous coat. After seven (7) days, may remove with alcohol and continue cycle. 6.6 mL 0   clotrimazole-betamethasone (LOTRISONE) cream Apply 1 Application topically daily. 60 g 0   ibuprofen (ADVIL,MOTRIN) 200 MG tablet Take 200 mg by mouth every 6 (six) hours as needed for moderate pain or mild pain.     insulin aspart (NOVOLOG) 100 UNIT/ML injection Insulin pump     Insulin Human (INSULIN PUMP) SOLN Inject into the skin as directed.     losartan-hydrochlorothiazide (HYZAAR) 100-12.5 MG tablet Take 1 tablet by mouth at bedtime.     metoprolol succinate (TOPROL-XL) 50 MG 24 hr tablet Take 50 mg by mouth at bedtime.     Oxymetazoline HCl (NASAL SPRAY) 0.05 % SOLN Place into the nose as needed.  pregabalin (LYRICA) 200 MG capsule Take 200 mg by mouth 3 (three) times daily.     Skin Protectants, Misc. (EUCERIN) cream Apply topically as needed for dry skin. 454 g 0   traZODone (DESYREL) 100 MG tablet Take 100 mg by mouth at bedtime.     zolpidem (AMBIEN) 10 MG tablet Take 10 mg by mouth at bedtime.     No current facility-administered medications on file prior to visit.    There are no Patient Instructions on file for this visit. No follow-ups on file.   Kris Hartmann, NP

## 2022-11-20 ENCOUNTER — Ambulatory Visit: Payer: Medicare HMO | Admitting: Podiatry

## 2022-11-20 DIAGNOSIS — B353 Tinea pedis: Secondary | ICD-10-CM

## 2022-11-20 DIAGNOSIS — L84 Corns and callosities: Secondary | ICD-10-CM | POA: Diagnosis not present

## 2022-11-20 DIAGNOSIS — M79674 Pain in right toe(s): Secondary | ICD-10-CM | POA: Diagnosis not present

## 2022-11-20 DIAGNOSIS — R234 Changes in skin texture: Secondary | ICD-10-CM | POA: Diagnosis not present

## 2022-11-20 DIAGNOSIS — E1142 Type 2 diabetes mellitus with diabetic polyneuropathy: Secondary | ICD-10-CM

## 2022-11-20 DIAGNOSIS — B351 Tinea unguium: Secondary | ICD-10-CM | POA: Diagnosis not present

## 2022-11-20 DIAGNOSIS — M79675 Pain in left toe(s): Secondary | ICD-10-CM

## 2022-11-20 NOTE — Progress Notes (Signed)
  Subjective:  Patient ID: Grant [name], male    DOB: 10/19/1971,  MRN: [id number]  Chief Complaint  Patient presents with   Nail Problem    Thick painful toenails, 3 month follow up - pick up diabetic shoes    51 y.o. male presents with the above complaint. History confirmed with patient.  His last A1c was 4.9% and this is doing very well for him.  He is concerned about a fissure that is developed on the back of his heel is not sure if the new inserts have caused this.  Objective:  Physical Exam: warm, good capillary refill, no trophic changes or ulcerative lesions, normal DP and PT pulses, abnormal sensory exam, and dry scaling rash around plantar feet, medial heel has improved, bilateral hallux nails mycotic and thick, he has a fissure on the posterior plantar portion of the left heel, no active drainage no ulceration.  Submetatarsal 5 and base preulcerative calluses on left foot   Assessment:   1. Fissure in skin of foot   2. Type 2 diabetes mellitus with peripheral neuropathy (HCC)   3. Pain due to onychomycosis of toenails of both feet   4. Tinea pedis of both feet   5. Pre-ulcerative calluses [L84]      Plan:  Patient was evaluated and treated and all questions answered.  His new diabetic shoes were dispensed we discussed the break-in.  He will let me know if there are issues of this. Discussed the etiology and treatment options for the condition in detail with the patient. Educated patient on the topical and oral treatment options for mycotic nails. Recommended debridement of the nails today. Sharp and mechanical debridement performed of all painful and mycotic nails today. Nails debrided in length and thickness using a nail nipper to level of comfort. Discussed treatment options including appropriate shoe gear. Follow up as needed for painful nails.  He will return in 3 months for at risk diabetic footcare  Continue using the Lotrisone now on the fissure and dry areas of  skin.  I will see him back in 1 month to reevaluate this  All symptomatic hyperkeratoses were safely debrided with a sterile #15 blade to patient's level of comfort without incident. We discussed preventative and palliative care of these lesions including supportive and accommodative shoegear, padding, prefabricated and custom molded accommodative orthoses, use of a pumice stone and lotions/creams daily.   Return in about 1 month (around 12/21/2022) for check left heel fissue .

## 2022-11-20 NOTE — Patient Instructions (Addendum)
Use the Lotrisone cream once a day on the heel, then use a good moisturizing lotion such as Gold Bond Diabetic lotion the other part of the day

## 2022-12-03 DIAGNOSIS — E1140 Type 2 diabetes mellitus with diabetic neuropathy, unspecified: Secondary | ICD-10-CM | POA: Diagnosis not present

## 2022-12-03 DIAGNOSIS — I2510 Atherosclerotic heart disease of native coronary artery without angina pectoris: Secondary | ICD-10-CM | POA: Diagnosis not present

## 2022-12-03 DIAGNOSIS — M7711 Lateral epicondylitis, right elbow: Secondary | ICD-10-CM | POA: Diagnosis not present

## 2022-12-03 DIAGNOSIS — M489 Spondylopathy, unspecified: Secondary | ICD-10-CM | POA: Diagnosis not present

## 2022-12-03 DIAGNOSIS — M7712 Lateral epicondylitis, left elbow: Secondary | ICD-10-CM | POA: Diagnosis not present

## 2022-12-03 DIAGNOSIS — G458 Other transient cerebral ischemic attacks and related syndromes: Secondary | ICD-10-CM | POA: Diagnosis not present

## 2022-12-03 DIAGNOSIS — G629 Polyneuropathy, unspecified: Secondary | ICD-10-CM | POA: Diagnosis not present

## 2022-12-03 DIAGNOSIS — E785 Hyperlipidemia, unspecified: Secondary | ICD-10-CM | POA: Diagnosis not present

## 2022-12-03 DIAGNOSIS — I10 Essential (primary) hypertension: Secondary | ICD-10-CM | POA: Diagnosis not present

## 2022-12-03 NOTE — Progress Notes (Signed)
Referring Physician:  Kris Hartmann, NP [place],  [place] [address]  Primary Physician:  Donnajean Lopes, MD  History of Present Illness: 12/07/2022 Mr. Ram Haugan has a history of HTN, subclavian arterial stenosis, ETOH abuse, DM with DM neuropathy, gastric ulcer, sleep apnea, and PAD.  He has more constant neck pain with more constant right arm pain that sometimes goes into his hand x 9-10 months. He has numbness and tingling in right arm. Neck pain is worse with turning his head- he has sharp pain. He notes some weakness in right arm. Right arm pain is worse with doing anything up over his head. No known injury.   He has known DM neuropathy in his hands/feet.   He uses chewing tobacco.     Conservative measures:  Physical therapy: none  Multimodal medical therapy including regular antiinflammatories: advil, lyrica  Injections: No epidural steroid injections  Past Surgery: No spinal surgery.   LOCHLANN MASTRANGELO has no symptoms of cervical myelopathy.  The symptoms are causing a significant impact on the patient's life.   Review of Systems:  A 10 point review of systems is negative, except for the pertinent positives and negatives detailed in the HPI.  Past Medical History: Past Medical History:  Diagnosis Date   Alcohol abuse    Anxiety    Arteritic AION (anterior ischemic optic neuropathy)    Chronic low back pain 11/30/2015   Chronic pain    Depression    Diabetic peripheral neuropathy ([place]) 11/30/2015   Gastric ulcer    Hypertension    Lumbar radiculopathy    Neuropathy    Sleep apnea    Type 2 diabetes mellitus ([place])     Past Surgical History: Past Surgical History:  Procedure Laterality Date   AMPUTATION Right 12/07/2020   Procedure: RIGHT FOOT 5TH RAY AMPUTATION;  Surgeon: [name], MD;  Location: [place];  Service: Orthopedics;  Laterality: Right;   back lipoma resection  03/2006   BIOPSY  06/15/2018   Procedure:  BIOPSY;  Surgeon: Jerene Bears, MD;  Location: [facility];  Service: Gastroenterology;;   CARDIAC CATHETERIZATION     ESOPHAGOGASTRODUODENOSCOPY (EGD) WITH PROPOFOL N/A 06/15/2018   Procedure: ESOPHAGOGASTRODUODENOSCOPY (EGD) WITH PROPOFOL;  Surgeon: Jerene Bears, MD;  Location: [facility] ENDOSCOPY;  Service: Gastroenterology;  Laterality: N/A;   KNEE ARTHROSCOPY Left 10/2008   Meniscus Repair   KNEE ARTHROSCOPY W/ ACL RECONSTRUCTION Left 1998   LEFT HEART CATHETERIZATION WITH CORONARY ANGIOGRAM N/A 08/11/2013   Procedure: LEFT HEART CATHETERIZATION WITH CORONARY ANGIOGRAM;  Surgeon: Pixie Casino, MD;  Location: [facility] CATH LAB;  Service: Cardiovascular;  Laterality: N/A;   LOWER EXTREMITY ANGIOGRAPHY Right 11/10/2020   Procedure: LOWER EXTREMITY ANGIOGRAPHY;  Surgeon: Algernon Huxley, MD;  Location: [place] CV LAB;  Service: Cardiovascular;  Laterality: Right;   MENISCUS REPAIR Left 2001   TOTAL KNEE ARTHROPLASTY Left    UPPER GI ENDOSCOPY  10/2007   showed gastritis    Allergies: Allergies as of 12/07/2022 - Review Complete 12/07/2022  Allergen Reaction Noted   Codeine Nausea And Vomiting 12/19/2018   Cymbalta [duloxetine hcl]  06/27/2021   Duloxetine  06/27/2021   Glucophage [metformin]  06/27/2021   Lexapro [escitalopram]  06/27/2021   Metformin hcl Diarrhea and Nausea Only 06/07/2021   Ozempic (0.25 or 0.5 mg-dose) [semaglutide(0.25 or 0.'5mg'$ -dos)]  06/27/2021   Tylox [oxycodone-acetaminophen] Nausea And Vomiting 08/05/2013    Medications: Outpatient Encounter Medications as of  12/07/2022  Medication Sig   aspirin EC 81 MG tablet Take 1 tablet PO daily   atorvastatin (LIPITOR) 10 MG tablet    insulin aspart (NOVOLOG) 100 UNIT/ML injection Insulin pump   Insulin Human (INSULIN PUMP) SOLN Inject into the skin as directed.   losartan-hydrochlorothiazide (HYZAAR) 100-12.5 MG tablet Take 1 tablet by mouth at bedtime.   metoprolol succinate (TOPROL-XL) 50 MG 24 hr tablet Take 50 mg  by mouth at bedtime.   pregabalin (LYRICA) 200 MG capsule Take 200 mg by mouth 3 (three) times daily.   traZODone (DESYREL) 100 MG tablet Take 100 mg by mouth at bedtime.   zolpidem (AMBIEN) 10 MG tablet Take 10 mg by mouth at bedtime.   [DISCONTINUED] ciclopirox (PENLAC) 8 % solution Apply topically at bedtime. Apply over nail and surrounding skin. Apply daily over previous coat. After seven (7) days, may remove with alcohol and continue cycle. (Patient not taking: Reported on 12/07/2022)   [DISCONTINUED] clotrimazole-betamethasone (LOTRISONE) cream Apply 1 Application topically daily. (Patient not taking: Reported on 12/07/2022)   [DISCONTINUED] ibuprofen (ADVIL,MOTRIN) 200 MG tablet Take 200 mg by mouth every 6 (six) hours as needed for moderate pain or mild pain. (Patient not taking: Reported on 12/07/2022)   [DISCONTINUED] Oxymetazoline HCl (NASAL SPRAY) 0.05 % SOLN Place into the nose as needed. (Patient not taking: Reported on 12/07/2022)   [DISCONTINUED] Skin Protectants, Misc. (EUCERIN) cream Apply topically as needed for dry skin. (Patient not taking: Reported on 12/07/2022)   No facility-administered encounter medications on file as of 12/07/2022.    Social History: Social History   Tobacco Use   Smoking status: Former    Packs/day: 0.50    Years: 20.00    Total pack years: 10.00    Types: Cigarettes    Quit date: 03/23/2016    Years since quitting: 6.7   Smokeless tobacco: Current    Types: Chew   Tobacco comments:     chews daily  Vaping Use   Vaping Use: Never used  Substance Use Topics   Alcohol use: No    Comment: Recovered alcoholic   Drug use: No    Family Medical History: Family History  Problem Relation Age of Onset   Diabetes Mother    Hypertension Mother    Esophageal cancer Father    Hypertension Father    Hyperlipidemia Father    Throat cancer Father    Alcohol abuse Brother    Brain cancer Maternal Grandfather    Stomach cancer Paternal Grandfather     Colon cancer Neg Hx    Rectal cancer Neg Hx     Physical Examination: Vitals:   12/07/22 1053  BP: 130/82    General: Patient is well developed, well nourished, calm, collected, and in no apparent distress. Attention to examination is appropriate.  Respiratory: Patient is breathing without any difficulty.   NEUROLOGICAL:     Awake, alert, oriented to person, place, and time.  Speech is clear and fluent. Fund of knowledge is appropriate.   Cranial Nerves: Pupils equal round and reactive to light.  Facial tone is symmetric.  Facial sensation is symmetric.  Pain with ROM of cervical spine.  No posterior cervical tenderness.  No bilateral trapezial tenderness.   No abnormal lesions on exposed skin.   Strength: Side Biceps Triceps Deltoid Interossei Grip Wrist Ext. Wrist Flex.  R '5 5 5 5 5 5 5  '$ L '5 5 5 5 5 5 5   '$ Side Iliopsoas Quads Hamstring PF DF  EHL  R '5 5 5 5 5 5  '$ L '5 5 5 5 5 5   '$ Reflexes are 2+ and symmetric at the biceps, triceps, brachioradialis, patella and achilles.   Hoffman's is absent.  Clonus is not present.   Bilateral upper and lower extremity sensation is intact to light touch.     Gait is normal.     Medical Decision Making  Imaging: No cervical imaging available.   Assessment and Plan: Mr. Necaise is a pleasant 52 y.o. male with 9-10 month history of constant neck pain with more constant right arm pain that sometimes goes into his hand. Neck pain is worse with turning his head- he has sharp pain. Right arm pain is worse with doing anything up over his head.   No cervical imaging done. Symptoms consistent with cervical radiculopathy. He does have known DM neuropathy in both hands/feet.   Of note, he has known subclavian stenosis and vascular felt this may be contributing to some, but not all of his symptoms.   Treatment options discussed with patient and following plan made:   - MRI of cervical spine to evaluate cervical radiculopathy. No improvement  with time or medications (motrin, lyrica).  - He requests valium to take prior to MRI. He will let me know when scan is scheduled and I will send to pharmacy.  - Will schedule him follow up with me to review his MRI results once I have them back.   I spent a total of 35 minutes in face-to-face and non-face-to-face activities related to this patient's care today including review of outside records, review of imaging, review of symptoms, physical exam, discussion of differential diagnosis, discussion of treatment options, and documentation.   Thank you for involving me in the care of this patient.   Geronimo Boot PA-C Dept. of Neurosurgery

## 2022-12-07 ENCOUNTER — Encounter: Payer: Self-pay | Admitting: Orthopedic Surgery

## 2022-12-07 ENCOUNTER — Ambulatory Visit (INDEPENDENT_AMBULATORY_CARE_PROVIDER_SITE_OTHER): Payer: Medicare HMO | Admitting: Orthopedic Surgery

## 2022-12-07 VITALS — BP 130/82 | Ht 71.5 in | Wt 229.0 lb

## 2022-12-07 DIAGNOSIS — M5412 Radiculopathy, cervical region: Secondary | ICD-10-CM

## 2022-12-07 DIAGNOSIS — M542 Cervicalgia: Secondary | ICD-10-CM | POA: Diagnosis not present

## 2022-12-07 NOTE — Patient Instructions (Signed)
It was so nice to see you today, I am sorry that you are hurting so much.   I think the pain in your neck and right arm is coming from your neck.   I want to get an MRI of your neck to look into things further. We will get this approved through your insurance and  will call you to schedule.   Let me know when MRI is scheduled and I can send in a prescription for valium.   Once I get the MRI results back, we will call to schedule f/u with me to review them.   Please do not hesitate to call if you have any questions or concerns. You can also message me in [place].   Geronimo Boot PA-C [phone]

## 2022-12-17 ENCOUNTER — Encounter: Payer: Self-pay | Admitting: Sports Medicine

## 2022-12-17 ENCOUNTER — Ambulatory Visit: Payer: Self-pay

## 2022-12-17 ENCOUNTER — Ambulatory Visit (INDEPENDENT_AMBULATORY_CARE_PROVIDER_SITE_OTHER): Payer: Medicare HMO | Admitting: Sports Medicine

## 2022-12-17 DIAGNOSIS — M25522 Pain in left elbow: Secondary | ICD-10-CM | POA: Diagnosis not present

## 2022-12-17 DIAGNOSIS — M5412 Radiculopathy, cervical region: Secondary | ICD-10-CM

## 2022-12-17 DIAGNOSIS — M7711 Lateral epicondylitis, right elbow: Secondary | ICD-10-CM

## 2022-12-17 DIAGNOSIS — M542 Cervicalgia: Secondary | ICD-10-CM

## 2022-12-17 DIAGNOSIS — M7712 Lateral epicondylitis, left elbow: Secondary | ICD-10-CM | POA: Diagnosis not present

## 2022-12-17 MED ORDER — DIAZEPAM 5 MG PO TABS: ORAL_TABLET | ORAL | 0 refills | Status: AC

## 2022-12-17 MED ORDER — CELECOXIB 100 MG PO CAPS: 100.0000 mg | ORAL_CAPSULE | Freq: Two times a day (BID) | ORAL | 0 refills | Status: DC

## 2022-12-17 NOTE — Progress Notes (Signed)
4 months of pain No injury to either elbow Minimal tingling/numbness into hands He takes tramadol for the pain  States he has not had any treatment for this  Patient was instructed in 10 minutes of therapeutic exercises for bilateral elbow pain to improve strength, ROM and function according to my instructions and plan of care by a Certified Athletic Trainer during the office visit. A customized handout was provided and demonstration of proper technique shown and discussed. Patient did perform exercises and demonstrate understanding through teachback.  All questions discussed and answered.

## 2022-12-17 NOTE — Progress Notes (Signed)
[name] - 52 y.o. male MRN [id number]  Date of birth: [birth year]  Office Visit Note: Visit Date: 12/17/2022 PCP: Grant Lopes, MD Referred by: Grant Lopes, MD  Subjective: Chief Complaint  Patient presents with   Right Elbow - Pain   Left Elbow - Pain   HPI: Grant [name] is a pleasant 52 y.o. male who presents today for bilateral elbow pain.  He has had about 4 months of pain of bilateral elbows, R > L.  There is no specific injury to either elbow.  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  Has waxed and waned since this time, but never gone away.  Denies any weakness, but does have pain with gripping certain items.  Pain will radiate down the forearm.  He was given tramadol to take as needed for the pain, does help slightly.  No other medication use for this specifically.  No rehab yet.  Pertinent ROS were reviewed with the patient and found to be negative unless otherwise specified above in HPI.   Assessment & Plan: Visit Diagnoses:  1. Pain in left elbow   2. Lateral epicondylitis, left elbow   3. Lateral epicondylitis, right elbow    Plan: Discussed with Grant [name] today that his elbow pain does seem indicative of right lateral epicondylitis.  We discussed all treatment options such as oral medication therapy, topical medicine, home rehab versus physical therapy, injection therapy, shockwave therapy.  Did perform a trial of extracorporeal shockwave therapy to both elbows, patient tolerated well.  We will see how this does over the coming days.  I would like him to get started on a low-dose of an anti-inflammatory, we will begin Celebrex 100 mg once daily, may increase to twice daily if he tolerates well without side effects.  Ideally would only use this for about 3 weeks or so, then be taken as needed.  I did review rehab protocol for his lateral epicondylitis for him to begin at home, I athletic trainer,  Grant [name], did review these exercises with him in the room today and perform teach back.  I did also provide him information on the counterforce strap that he may get over-the-counter to wear with activity.  He will follow-up in 1 week.  Follow-up: Return in about 1 week (around 12/24/2022).   Meds & Orders:  Meds ordered this encounter  Medications   celecoxib (CELEBREX) 100 MG capsule    Sig: Take 1 capsule (100 mg total) by mouth 2 (two) times daily for 21 days. Can start with 1x daily --> increase to twice daily as tolerated    Dispense:  42 capsule    Refill:  0    Orders Placed This Encounter  Procedures   [place] Extrem Up Left Ltd     Procedures: Procedure: ECSWT Indications:  Lateral epicondylitis, bilateral   Procedure Details Consent: Risks of procedure as well as the alternatives and risks of each were explained to the patient.  Verbal consent for procedure obtained. Time Out: Verified patient identification, verified procedure, site was marked, verified correct patient position. The area was cleaned with alcohol swab.     The Left lateral epicondyle and common extensor tendon musculature was targeted for Extracorporeal shockwave therapy.    Preset: Lateral epicondylitis Power Level: 120 mJ Frequency: '10Hz'$  Impulse/cycles: 2200 Head size: Regular  The Right lateral epicondyle and common extensor tendon musculature was targeted for Extracorporeal shockwave therapy.  Preset: Lateral epicondylitis Power Level: 120 mJ Frequency: '10Hz'$  Impulse/cycles: 2200 Head size: Regular   Patient tolerated procedures well without immediate complications.         Clinical History: No specialty comments available.  He reports that he quit smoking about 6 years ago. His smoking use included cigarettes. He has a 10.00 pack-year smoking history. His smokeless tobacco use includes chew. No results for input(s): "HGBA1C", "LABURIC" in the last 8760 hours.  Objective:    Physical Exam   Gen: Well-appearing, in no acute distress; non-toxic CV: Well-perfused. Warm.  Resp: Breathing unlabored on room air; no wheezing. Psych: Fluid speech in conversation; appropriate affect; normal thought process Neuro: Sensation intact throughout. No gross coordination deficits.   Ortho Exam - Bilateral elbows: There is no swelling or effusion about the elbow.  No overlying skin changes.  There is a glucose monitor on the left upper arm.  Positive TTP over the lateral epicondyle bilaterally, right greater than left.  There is positive pain with third finger extension, resisted pronation and supination about the wrist.  No weakness in grip strength.  Full active and passive range of motion about the elbow without mechanical block.  Imaging: [place] Extrem Up Left Ltd  Result Date: 12/17/2022 Limited musculoskeletal ultrasound of the left elbow was performed today.  Evaluation of the left elbow joint shows no gross effusion.  The lateral epicondyle was identified without cortical regularity.  Insertion of the common extensor tendon was visualized.  There is some mild hyperemia in this region with some evidence of insertional tendinosis without full-thickness tearing.  This is comparable to the contralateral elbow.   Past Medical/Family/Surgical/Social History: Medications & Allergies reviewed per EMR, new medications updated. Patient Active Problem List   Diagnosis Date Noted   Asymmetric blood pressures 10/16/2022   PAD (peripheral artery disease) ([place]) 07/17/2022   Osteomyelitis of fifth toe of right foot ([place])    Alcohol use disorder, severe, dependence ([place]) 04/16/2019   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[place]) 04/16/2019   Cannabis hyperemesis syndrome concurrent with and due to cannabis abuse ([place]) 08/01/2018   Gastroesophageal reflux disease 07/17/2018   Long QT interval 07/16/2018   Early satiety    Esophagitis, [place] grade D    Hypercalcemia  06/09/2018   SIRS (systemic inflammatory response syndrome) ([place]) 06/09/2018   Hematemesis 06/09/2018   Type 2 diabetes mellitus with peripheral neuropathy ([place]) 06/09/2018   Closed displaced fracture of first metatarsal bone of left foot 07/31/2017   Acidosis 09/17/2016   Dehydration 09/17/2016   Abdominal pain 09/17/2016   DKA (diabetic ketoacidoses) 09/17/2016   Nausea and vomiting 09/17/2016   IDDM (insulin dependent diabetes mellitus)    Gastroparesis    Diabetic peripheral neuropathy ([place]) 11/30/2015   Chronic low back pain 11/30/2015   Coronary artery spasm ([place]) 08/17/2013   Hyperlipidemia 08/17/2013   Peripheral neuropathy 08/05/2013   Chest pain 08/05/2013   Fatigue 08/05/2013   Tachycardia 08/05/2013   DOE (dyspnea on exertion) 08/05/2013   Diabetes mellitus, insulin dependent (IDDM), uncontrolled 02/09/2008   Anxiety state 02/09/2008   Essential hypertension 02/09/2008   ALCOHOL ABUSE, HX OF 02/09/2008   LIVER FUNCTION TESTS, ABNORMAL, HX OF 02/09/2008   NEOPLASM, BENIGN, ESOPHAGUS 09/16/2007   Gastritis 09/16/2007   Past Medical History:  Diagnosis Date   Alcohol abuse    Anxiety    Arteritic AION (anterior ischemic optic neuropathy)    Chronic low back pain 11/30/2015   Chronic pain  Depression    Diabetic peripheral neuropathy ([place]) 11/30/2015   Gastric ulcer    Hypertension    Lumbar radiculopathy    Neuropathy    Sleep apnea    Type 2 diabetes mellitus ([place])    Family History  Problem Relation Age of Onset   Diabetes Mother    Hypertension Mother    Esophageal cancer Father    Hypertension Father    Hyperlipidemia Father    Throat cancer Father    Alcohol abuse Brother    Brain cancer Maternal Grandfather    Stomach cancer Paternal Grandfather    Colon cancer Neg Hx    Rectal cancer Neg Hx    Past Surgical History:  Procedure Laterality Date   AMPUTATION Right 12/07/2020   Procedure: RIGHT FOOT 5TH RAY AMPUTATION;  Surgeon: [name], MD;  Location: [place];  Service: Orthopedics;  Laterality: Right;   back lipoma resection  03/2006   BIOPSY  06/15/2018   Procedure: BIOPSY;  Surgeon: Jerene Bears, MD;  Location: [facility] ENDOSCOPY;  Service: Gastroenterology;;   CARDIAC CATHETERIZATION     ESOPHAGOGASTRODUODENOSCOPY (EGD) WITH PROPOFOL N/A 06/15/2018   Procedure: ESOPHAGOGASTRODUODENOSCOPY (EGD) WITH PROPOFOL;  Surgeon: Jerene Bears, MD;  Location[facility] ENDOSCOPY;  Service: Gastroenterology;  Laterality: N/A;   KNEE ARTHROSCOPY Left 10/2008   Meniscus Repair   KNEE ARTHROSCOPY W/ ACL RECONSTRUCTION Left 1998   LEFT HEART CATHETERIZATION WITH CORONARY ANGIOGRAM N/A 08/11/2013   Procedure: LEFT HEART CATHETERIZATION WITH CORONARY ANGIOGRAM;  Surgeon: Pixie Casino, MD;  Location: [facility] CATH LAB;  Service: Cardiovascular;  Laterality: N/A;   LOWER EXTREMITY ANGIOGRAPHY Right 11/10/2020   Procedure: LOWER EXTREMITY ANGIOGRAPHY;  Surgeon: Algernon Huxley, MD;  Location: [place] CV LAB;  Service: Cardiovascular;  Laterality: Right;   MENISCUS REPAIR Left 2001   TOTAL KNEE ARTHROPLASTY Left    UPPER GI ENDOSCOPY  10/2007   showed gastritis   Social History   Occupational History   Occupation: disability  Tobacco Use   Smoking status: Former    Packs/day: 0.50    Years: 20.00    Total pack years: 10.00    Types: Cigarettes    Quit date: 03/23/2016    Years since quitting: 6.7   Smokeless tobacco: Current    Types: Chew   Tobacco comments:     chews daily  Vaping Use   Vaping Use: Never used  Substance and Sexual Activity   Alcohol use: No    Comment: Recovered alcoholic   Drug use: No   Sexual activity: Not on file   I spent 38 minutes in the care of the patient today including face-to-face time, preparation to see the patient, as well as review of pertinent external notes from primary care, counseling and educating the patient on home exercise plan, other treatment modalities, explanation and treatment of  extracorporeal shockwave therapy for the above diagnoses.   [name], DO Primary Care Sports Medicine Physician  [place]  This note was dictated using Dragon naturally speaking software and may contain errors in syntax, spelling, or content which have not been identified prior to signing this note.

## 2022-12-17 NOTE — Addendum Note (Signed)
Addended byGeronimo Boot on: 12/17/2022 03:05 PM   Modules accepted: Orders

## 2022-12-17 NOTE — Patient Instructions (Signed)
Grant [name], it was great to meet you today, thank you for letting me participate in your care.  Today, we discussed your bilateral elbow pain - indicative of tennis elbow.  Things you will do for this:  Try to avoid painful activities as much as possible. Ice the area 2-3 times a day for 15 minutes at a time. Celebrex 1-2x daily  Counterforce brace (elbow) as directed can help unload area - wear this regularly if it provides you with relief. You can look at this online or over-the-counter.  Follow up in 1-week  If you have any further questions, please give the clinic a call [phone].  [name], DO Primary Care Sports Medicine Physician  [place]

## 2022-12-19 ENCOUNTER — Ambulatory Visit: Payer: Medicare HMO | Admitting: Podiatry

## 2022-12-19 DIAGNOSIS — B353 Tinea pedis: Secondary | ICD-10-CM

## 2022-12-19 DIAGNOSIS — R234 Changes in skin texture: Secondary | ICD-10-CM | POA: Diagnosis not present

## 2022-12-19 NOTE — Patient Instructions (Signed)
Look for Tuli's heel cups on [employer]

## 2022-12-22 ENCOUNTER — Encounter: Payer: Self-pay | Admitting: Podiatry

## 2022-12-22 NOTE — Progress Notes (Signed)
  Subjective:  Patient ID: Grant [name], male    DOB: 1971-04-30,  MRN: [id number]  Chief Complaint  Patient presents with   fissure of skin    4 week follow up left heel    52 y.o. male presents with the above complaint. History confirmed with patient.  He states it is doing better now  Objective:  Physical Exam: warm, good capillary refill, no trophic changes or ulcerative lesions, normal DP and PT pulses, abnormal sensory exam, and dry scaling rash has improved quite a bit.   Assessment:   1. Fissure in skin of foot   2. Tinea pedis of both feet       Plan:  Patient was evaluated and treated and all questions answered.  Doing much better and seems to be resolving at this point.  He will continue using the Lotrisone if it returns.  Return to see me as needed if he develops any new ulcerations or areas of concern.  He has upcoming appointments for at risk diabetic footcare with Dr. Elisha Ponder.   Return if symptoms worsen or fail to improve.

## 2022-12-24 ENCOUNTER — Ambulatory Visit (HOSPITAL_COMMUNITY)
Admission: RE | Admit: 2022-12-24 | Discharge: 2022-12-24 | Disposition: A | Discharge status: Home / Self Care | Payer: Medicare HMO | Source: Ambulatory Visit | Attending: Orthopedic Surgery | Admitting: Orthopedic Surgery

## 2022-12-24 DIAGNOSIS — M5412 Radiculopathy, cervical region: Secondary | ICD-10-CM | POA: Diagnosis not present

## 2022-12-24 DIAGNOSIS — M542 Cervicalgia: Secondary | ICD-10-CM | POA: Insufficient documentation

## 2022-12-24 DIAGNOSIS — M4802 Spinal stenosis, cervical region: Secondary | ICD-10-CM | POA: Diagnosis not present

## 2022-12-25 ENCOUNTER — Encounter: Payer: Self-pay | Admitting: Orthopedic Surgery

## 2022-12-25 NOTE — Progress Notes (Addendum)
MRI of cervical spine dated 12/24/22:  FINDINGS: Alignment: Slight C7-T1 anterolisthesis.   Vertebrae: No fracture, evidence of discitis, or bone lesion. Mild marrow edema at the left C7-T1 and right C6-7 facets.   Cord: Normal signal and morphology.   Posterior Fossa, vertebral arteries, paraspinal tissues: No perispinal mass or inflammation. Partially covered left mastoid opacification. Negative nasopharynx where covered on sagittal imaging.   Disc levels:   C2-3: Mild facet and uncovertebral spurring. Patent canal and foramina   C3-4: Moderate facet spurring. Mild uncovertebral ridging. Mild to moderate bilateral foraminal narrowing   C4-5: Uncovertebral and facet spurring eccentric to the right where there is moderate foraminal stenosis.   C5-6: Mild disc space narrowing and bulging with uncovertebral ridging. Mild bilateral facet spurring. Moderate left more than right foraminal stenosis   C6-7: Degenerative facet spurring with circumferential disc bulging. Mild spinal stenosis. Bilateral foraminal stenosis which is advanced on the right and more moderate appearing on the left.   C7-T1:Degenerative facet spurring with mild anterolisthesis. Mild disc height loss. No neural impingement.   IMPRESSION: 1. Generalized cervical spine degeneration as described. Active facet arthritis on the right at C6-7 and left at C7-T1. Mild C7-T1 anterolisthesis. 2. Foraminal stenosis on the right at C4-5 to C6-7. 3. Left foraminal stenosis at C5-6 and C6-7. 4. Partially covered left mastoid opacification.     Electronically Signed   By: Jorje Guild M.D.   On: 12/25/2022 06:48   I have personally reviewed the images and agree with the above interpretation.   Reviewed partially covered left mastoid opacification with Dr. Izora Ribas. Secure chat message sent to Dr. Beverly Gust (ENT) regarding this.   Dr. Tami Ribas looked at old head CT from 2019, it was there then and MRI  cervical spine looks about the same. Unless he is having left ear issues, he wouldn't worry about it. He is happy to see him if there is any question.

## 2022-12-26 ENCOUNTER — Ambulatory Visit: Payer: Medicare HMO | Admitting: Sports Medicine

## 2022-12-26 ENCOUNTER — Encounter: Payer: Self-pay | Admitting: Sports Medicine

## 2022-12-26 DIAGNOSIS — M7712 Lateral epicondylitis, left elbow: Secondary | ICD-10-CM

## 2022-12-26 DIAGNOSIS — M7711 Lateral epicondylitis, right elbow: Secondary | ICD-10-CM

## 2022-12-26 NOTE — Progress Notes (Signed)
Got relief from last visit; still having some pain But not terrible

## 2022-12-26 NOTE — Progress Notes (Unsigned)
Referring Physician:  No referring provider defined for this encounter.  Primary Physician:  Donnajean Lopes, MD  History of Present Illness: Mr. Grant [name] has a history of HTN, subclavian arterial stenosis, ETOH abuse, DM with DM neuropathy, gastric ulcer, sleep apnea, and PAD.  Last seen by me on 12/07/22 for neck pain. Here to review his cervical MRI.   He continues with more constant neck pain with more constant right arm pain that sometimes goes into his hand. He has numbness and tingling in right arm. Neck pain is worse with turning his head- he has sharp pain. He notes some weakness in right arm. Right arm pain is worse with doing anything up over his head.   He has known DM neuropathy in his hands/feet.   He uses chewing tobacco.     Conservative measures:  Physical therapy: none  Multimodal medical therapy including regular antiinflammatories: advil, lyrica  Injections: No epidural steroid injections  Past Surgery: No spinal surgery.   Grant [name] has no symptoms of cervical myelopathy.  The symptoms are causing a significant impact on the patient's life.   Review of Systems:  A 10 point review of systems is negative, except for the pertinent positives and negatives detailed in the HPI.  Past Medical History: Past Medical History:  Diagnosis Date   Alcohol abuse    Anxiety    Arteritic AION (anterior ischemic optic neuropathy)    Chronic low back pain 11/30/2015   Chronic pain    Depression    Diabetic peripheral neuropathy (Grant [name]) 11/30/2015   Gastric ulcer    Hypertension    Lumbar radiculopathy    Neuropathy    Sleep apnea    Type 2 diabetes mellitus ([place])     Past Surgical History: Past Surgical History:  Procedure Laterality Date   AMPUTATION Right 12/07/2020   Procedure: RIGHT FOOT 5TH RAY AMPUTATION;  Surgeon: [name], MD;  Location: [place];  Service: Orthopedics;  Laterality: Right;   back lipoma resection  03/2006   BIOPSY   06/15/2018   Procedure: BIOPSY;  Surgeon: Jerene Bears, MD;  Location: [facility] ENDOS[facility];  Service: Gastroenterology;;   CARDIAC CATHETERIZATION     ESOPHAGOGASTRODUODENOSCOPY (EGD) WITH PROPOFOL N/A 06/15/2018   Procedure: ESOPHAGOGASTRODUODENOSCOPY (EGD) WITH PROPOFOL;  Surgeon: Jerene Bears, MD;  Location: [facility] ENDOSCOPY;  Service: Gastroenterology;  Laterality: N/A;   KNEE ARTHROSCOPY Left 10/2008   Meniscus Repair   KNEE ARTHROSCOPY W/ ACL RECONSTRUCTION Left 1998   LEFT HEART CATHETERIZATION WITH CORONARY ANGIOGRAM N/A 08/11/2013   Procedure: LEFT HEART CATHETERIZATION WITH CORONARY ANGIOGRAM;  Surgeon: Pixie Casino, MD;  Location: [facility] CATH LAB;  Service: Cardiovascular;  Laterality: N/A;   LOWER EXTREMITY ANGIOGRAPHY Right 11/10/2020   Procedure: LOWER EXTREMITY ANGIOGRAPHY;  Surgeon: Algernon Huxley, MD;  Location: [place] CV LAB;  Service: Cardiovascular;  Laterality: Right;   MENISCUS REPAIR Left 2001   TOTAL KNEE ARTHROPLASTY Left    UPPER GI ENDOSCOPY  10/2007   showed gastritis    Allergies: Allergies as of 12/27/2022 - Review Complete 12/26/2022  Allergen Reaction Noted   Codeine Nausea And Vomiting 12/19/2018   Cymbalta [duloxetine hcl]  06/27/2021   Duloxetine  06/27/2021   Glucophage [metformin]  06/27/2021   Lexapro [escitalopram]  06/27/2021   Metformin hcl Diarrhea and Nausea Only 06/07/2021   Ozempic (0.25 or 0.5 mg-dose) [semaglutide(0.25 or 0.'5mg'$ -dos)]  06/27/2021   Tylox [oxycodone-acetaminophen] Nausea And Vomiting 08/05/2013    Medications: Outpatient  Encounter Medications as of 12/27/2022  Medication Sig   aspirin EC 81 MG tablet Take 1 tablet PO daily   atorvastatin (LIPITOR) 10 MG tablet    celecoxib (CELEBREX) 100 MG capsule Take 1 capsule (100 mg total) by mouth 2 (two) times daily for 21 days. Can start with 1x daily --> increase to twice daily as tolerated   diazepam (VALIUM) 5 MG tablet 1 po 30 minutes prior to MRI scan. May repeat x 1 right  before MRI scan if needed. You will need a driver.   insulin aspart (NOVOLOG) 100 UNIT/ML injection Insulin pump   Insulin Human (INSULIN PUMP) SOLN Inject into the skin as directed.   losartan-hydrochlorothiazide (HYZAAR) 100-12.5 MG tablet Take 1 tablet by mouth at bedtime.   metoprolol succinate (TOPROL-XL) 50 MG 24 hr tablet Take 50 mg by mouth at bedtime.   pregabalin (LYRICA) 200 MG capsule Take 200 mg by mouth 3 (three) times daily.   traZODone (DESYREL) 100 MG tablet Take 100 mg by mouth at bedtime.   zolpidem (AMBIEN) 10 MG tablet Take 10 mg by mouth at bedtime.   No facility-administered encounter medications on file as of 12/27/2022.    Social History: Social History   Tobacco Use   Smoking status: Former    Packs/day: 0.50    Years: 20.00    Total pack years: 10.00    Types: Cigarettes    Quit date: 03/23/2016    Years since quitting: 6.7   Smokeless tobacco: Current    Types: Chew   Tobacco comments:     chews daily  Vaping Use   Vaping Use: Never used  Substance Use Topics   Alcohol use: No    Comment: Recovered alcoholic   Drug use: No    Family Medical History: Family History  Problem Relation Age of Onset   Diabetes Mother    Hypertension Mother    Esophageal cancer Father    Hypertension Father    Hyperlipidemia Father    Throat cancer Father    Alcohol abuse Brother    Brain cancer Maternal Grandfather    Stomach cancer Paternal Grandfather    Colon cancer Neg Hx    Rectal cancer Neg Hx     Physical Examination: There were no vitals filed for this visit.  Awake, alert, oriented to person, place, and time.  Speech is clear and fluent. Fund of knowledge is appropriate.   Cranial Nerves: Pupils equal round and reactive to light.  Facial tone is symmetric.    Pain with ROM of cervical spine.  No posterior cervical tenderness.  No bilateral trapezial tenderness.   No abnormal lesions on exposed skin.   Strength: Side Biceps Triceps Deltoid  Interossei Grip Wrist Ext. Wrist Flex.  R '5 5 5 5 5 5 5  '$ L '5 5 5 5 5 5 5   '$ Reflexes are 2+ and symmetric at the biceps, triceps, brachioradialis.  Hoffman's is absent.   Bilateral upper extremity sensation is intact to light touch.     Gait is normal.     Medical Decision Making  Imaging: Cervical MRI dated 12/24/22:  FINDINGS: Alignment: Slight C7-T1 anterolisthesis.   Vertebrae: No fracture, evidence of discitis, or bone lesion. Mild marrow edema at the left C7-T1 and right C6-7 facets.   Cord: Normal signal and morphology.   Posterior Fossa, vertebral arteries, paraspinal tissues: No perispinal mass or inflammation. Partially covered left mastoid opacification. Negative nasopharynx where covered on sagittal imaging.   Disc  levels:   C2-3: Mild facet and uncovertebral spurring. Patent canal and foramina   C3-4: Moderate facet spurring. Mild uncovertebral ridging. Mild to moderate bilateral foraminal narrowing   C4-5: Uncovertebral and facet spurring eccentric to the right where there is moderate foraminal stenosis.   C5-6: Mild disc space narrowing and bulging with uncovertebral ridging. Mild bilateral facet spurring. Moderate left more than right foraminal stenosis   C6-7: Degenerative facet spurring with circumferential disc bulging. Mild spinal stenosis. Bilateral foraminal stenosis which is advanced on the right and more moderate appearing on the left.   C7-T1:Degenerative facet spurring with mild anterolisthesis. Mild disc height loss. No neural impingement.   IMPRESSION: 1. Generalized cervical spine degeneration as described. Active facet arthritis on the right at C6-7 and left at C7-T1. Mild C7-T1 anterolisthesis. 2. Foraminal stenosis on the right at C4-5 to C6-7. 3. Left foraminal stenosis at C5-6 and C6-7. 4. Partially covered left mastoid opacification.     Electronically Signed   By: Jorje Guild M.D.   On: 12/25/2022 06:48  I have  personally reviewed the images and agree with the above interpretation.    Assessment and Plan: Mr. Domangue is a pleasant 52 y.o. male with constant neck pain with more constant right arm pain that sometimes goes into his hand. Neck pain is worse with turning his head- he has sharp pain.   He has known cervical spondylosis with multilevel foraminal stenosis. Neck pain likely from underlying spondylosis. Right arm pain may be from right foraminal stenosis at C4-C5 or C6-C7.   Of note, he has known subclavian stenosis and vascular felt this may be contributing to some, but not all of his symptoms.   Treatment options discussed with patient and following plan made:   - Order for physical therapy for cervical spine to [facility] in [place].  - Continue current medications including celebrex from other providers.  - Referral to PMR at [facility] in [facility]) to discuss possible cervical injections.  - MRI also showed partially covered left mastoid opacification. This was reviewed with ENT (Dr. Tami Ribas). This was present on previous head CT in 2019. He did not recommend f/u unless patient was having left ear issues. He is having tinnitus in his ears and would like to follow up with Dr. Tami Ribas. Referral done.  - Phone follow up scheduled with me in 6-8 weeks.   I spent a total of 25 minutes in face-to-face and non-face-to-face activities related to this patient's care today including review of outside records, review of imaging, review of symptoms, physical exam, discussion of differential diagnosis, discussion of treatment options, and documentation.   Geronimo Boot PA-C Dept. of Neurosurgery

## 2022-12-26 NOTE — Progress Notes (Signed)
   Procedure Note  Grant [name] presents for f/u of bilateral lateral epicondylitis. Had some relief after the 1st shockwave treatment. Still some pain, but improved. Doing HEP, but pain with resisted ER with can.  Patient: Grant [name]             Date of Birth: 1970/12/01           MRN: [id number]             Visit Date: 12/26/2022  Procedures: Visit Diagnoses:  1. Lateral epicondylitis, left elbow   2. Lateral epicondylitis, right elbow    Procedure: ECSWT Indications:  Lateral epicondylitis, bilateral   Procedure Details Consent: Risks of procedure as well as the alternatives and risks of each were explained to the patient.  Verbal consent for procedure obtained. Time Out: Verified patient identification, verified procedure, site was marked, verified correct patient position. The area was cleaned with alcohol swab.     The Left lateral epicondyle and common extensor tendon musculature was targeted for Extracorporeal shockwave therapy.    Preset: Lateral epicondylitis Power Level: 120 mJ Frequency: '10Hz'$  Impulse/cycles: 2000 Head size: Regular   The Right lateral epicondyle and common extensor tendon musculature was targeted for Extracorporeal shockwave therapy.    Preset: Lateral epicondylitis Power Level: 120 mJ Frequency: '10Hz'$  Impulse/cycles: 2000 Head size: Regular   Patient tolerated procedures well without immediate complications.  - f/u next week for regular visit (will reconsider ECSWT if finding helpful) - continue Celebrex, HEP  [name], DO [place]  This note was dictated using Dragon naturally speaking software and may contain errors in syntax, spelling, or content which have not been identified prior to signing this note.

## 2022-12-27 ENCOUNTER — Encounter: Payer: Self-pay | Admitting: Orthopedic Surgery

## 2022-12-27 ENCOUNTER — Ambulatory Visit (INDEPENDENT_AMBULATORY_CARE_PROVIDER_SITE_OTHER): Payer: Medicare HMO | Admitting: Orthopedic Surgery

## 2022-12-27 VITALS — BP 120/78 | Ht 71.5 in | Wt 225.8 lb

## 2022-12-27 DIAGNOSIS — H9313 Tinnitus, bilateral: Secondary | ICD-10-CM | POA: Diagnosis not present

## 2022-12-27 DIAGNOSIS — M5412 Radiculopathy, cervical region: Secondary | ICD-10-CM

## 2022-12-27 DIAGNOSIS — M4722 Other spondylosis with radiculopathy, cervical region: Secondary | ICD-10-CM

## 2022-12-27 DIAGNOSIS — M47812 Spondylosis without myelopathy or radiculopathy, cervical region: Secondary | ICD-10-CM

## 2022-12-27 NOTE — Patient Instructions (Addendum)
It was so nice to see you today. Thank you so much for coming in.    Your MRI shows some wear and tear in your neck with some irritation of the nerves on the right. It also shows some mastoid opacification behind your left ear.   I sent physical therapy orders to [facility] in [place]. You can call them at [phone] if you don't hear from them to schedule your visit.   I want you to see physical medicine and rehab in [place] to discuss possible injections in your neck. Dr. Letta Pate will take great care of you. They should call you to schedule an appointment or you can call them at [phone].  I put in a referral for you to see ENT for the abnormality seen on MRI behind your left ear. Dr. Ileene Hutchinson staff should call your or you can call [phone].   We have a phone visit scheduled in 6-8 weeks. Please do not hesitate to call if you have any questions or concerns. You can also message me in [place].   Geronimo Boot PA-C [phone]

## 2023-01-03 ENCOUNTER — Encounter: Payer: Self-pay | Admitting: Sports Medicine

## 2023-01-03 ENCOUNTER — Ambulatory Visit: Payer: Medicare HMO | Admitting: Sports Medicine

## 2023-01-03 DIAGNOSIS — M7712 Lateral epicondylitis, left elbow: Secondary | ICD-10-CM

## 2023-01-03 DIAGNOSIS — M7711 Lateral epicondylitis, right elbow: Secondary | ICD-10-CM

## 2023-01-03 MED ORDER — CELECOXIB 100 MG PO CAPS: 100.0000 mg | ORAL_CAPSULE | Freq: Two times a day (BID) | ORAL | 0 refills | Status: AC

## 2023-01-03 NOTE — Progress Notes (Signed)
Grant [name] - 52 y.o. male MRN [id number]  Date of birth: 11/03/1971  Office Visit Note: Visit Date: 01/03/2023 PCP: Donnajean Lopes, MD Referred by: Donnajean Lopes, MD  Subjective: Chief Complaint  Patient presents with   Left Elbow - Follow-up   Right Elbow - Follow-up   HPI: Grant [name] is a pleasant 52 y.o. male who presents today for bilateral lateral epicondylitis.   Has had 2 treatments of ECSWT.  Continues with his home rehab exercises for lateral epicondylitis, doing once daily.  Has not yet used weight. Does feel like he is improving -reports about 40-50% improved from our initial visit. 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  Pertinent ROS were reviewed with the patient and found to be negative unless otherwise specified above in HPI.   Assessment & Plan: Visit Diagnoses:  1. Lateral epicondylitis, left elbow   2. Lateral epicondylitis, right elbow    Plan: Discussed with Grant [name] that he is certainly making progress with his lateral epicondylitis of both elbows.  He has responded well to extracorporeal shockwave therapy, as well as being consistent with the home rehab exercises.  He did refill his Celebrex today as he is finding benefit with this.  He may take this once to twice daily 100 mg.  I would like him to take it at least once for the anti-inflammatory effect at this point, he may take the second dose as needed for any breakthrough pain or exacerbation. Continue HEP.  Did discuss the role for possible injection therapy if for some reason he does not continue to make good progress. Recommended trialing counterforce strap for the elbows. F/u in 1 week.   Follow-up: Return in about 1 week (around 01/10/2023) for for b/l elbows (reg visit).   Meds & Orders:  Meds ordered this encounter  Medications   celecoxib (CELEBREX) 100 MG capsule    Sig: Take 1 capsule (100 mg total) by mouth 2 (two) times daily. Can start with 1x daily --> increase  to twice daily as tolerated    Dispense:  60 capsule    Refill:  0   No orders of the defined types were placed in this encounter.    Procedures: Procedure: ECSWT Indications:  Lateral epicondylitis, bilateral   Procedure Details Consent: Risks of procedure as well as the alternatives and risks of each were explained to the patient.  Verbal consent for procedure obtained. Time Out: Verified patient identification, verified procedure, site was marked, verified correct patient position. The area was cleaned with alcohol swab.     The Left lateral epicondyle and common extensor tendon musculature was targeted for Extracorporeal shockwave therapy.    Preset: Lateral epicondylitis Power Level: 120 mJ Frequency: '10Hz'$  Impulse/cycles: 2000 Head size: Regular   The Right lateral epicondyle and common extensor tendon musculature was targeted for Extracorporeal shockwave therapy.    Preset: Lateral epicondylitis Power Level: 120 mJ Frequency: '10Hz'$  Impulse/cycles: 2000 Head size: Regular   Patient tolerated procedures well without immediate complications.      Clinical History: No specialty comments available.  He reports that he quit smoking about 6 years ago. His smoking use included cigarettes. He has a 10.00 pack-year smoking history. His smokeless tobacco use includes chew. No results for input(s): "HGBA1C", "LABURIC" in the last 8760 hours.  Objective:    Physical Exam  Gen: Well-appearing, in no acute distress; non-toxic CV:  Well-perfused. Warm.  Resp: Breathing unlabored on room air; no wheezing.  Psych: Fluid speech in conversation; appropriate affect; normal thought process Neuro: Sensation intact throughout. No gross coordination deficits.   Ortho Exam -Bilateral elbows: Mild TTP over the lateral epicondyle and just distal to knee, right greater than left.  There is certainly less pain with provocative maneuvers such as third finger extension and resisted pronation and  supination.  No weakness of grip strength.  No overlying swelling or redness.  Full active and passive range of motion about the elbows.  Imaging: No results found.  Past Medical/Family/Surgical/Social History: Medications & Allergies reviewed per EMR, new medications updated. Patient Active Problem List   Diagnosis Date Noted   Asymmetric blood pressures 10/16/2022   PAD (peripheral artery disease) ([place]) 07/17/2022   Osteomyelitis of fifth toe of right foot ([place])    Alcohol use disorder, severe, dependence ([place]) 04/16/2019   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[place]) 04/16/2019   Cannabis hyperemesis syndrome concurrent with and due to cannabis abuse ([place]) 08/01/2018   Gastroesophageal reflux disease 07/17/2018   Long QT interval 07/16/2018   Early satiety    Esophagitis, [place] grade D    Hypercalcemia 06/09/2018   SIRS (systemic inflammatory response syndrome) ([place]) 06/09/2018   Hematemesis 06/09/2018   Type 2 diabetes mellitus with peripheral neuropathy ([place]) 06/09/2018   Closed displaced fracture of first metatarsal bone of left foot 07/31/2017   Acidosis 09/17/2016   Dehydration 09/17/2016   Abdominal pain 09/17/2016   DKA (diabetic ketoacidoses) 09/17/2016   Nausea and vomiting 09/17/2016   IDDM (insulin dependent diabetes mellitus)    Gastroparesis    Diabetic peripheral neuropathy ([place]) 11/30/2015   Chronic low back pain 11/30/2015   Coronary artery spasm ([place]) 08/17/2013   Hyperlipidemia 08/17/2013   Peripheral neuropathy 08/05/2013   Chest pain 08/05/2013   Fatigue 08/05/2013   Tachycardia 08/05/2013   DOE (dyspnea on exertion) 08/05/2013   Diabetes mellitus, insulin dependent (IDDM), uncontrolled 02/09/2008   Anxiety state 02/09/2008   Essential hypertension 02/09/2008   ALCOHOL ABUSE, HX OF 02/09/2008   LIVER FUNCTION TESTS, ABNORMAL, HX OF 02/09/2008   NEOPLASM, BENIGN, ESOPHAGUS 09/16/2007   Gastritis 09/16/2007    Past Medical History:  Diagnosis Date   Alcohol abuse    Anxiety    Arteritic AION (anterior ischemic optic neuropathy)    Chronic low back pain 11/30/2015   Chronic pain    Depression    Diabetic peripheral neuropathy ([place]) 11/30/2015   Gastric ulcer    Hypertension    Lumbar radiculopathy    Neuropathy    Sleep apnea    Type 2 diabetes mellitus ([place])    Family History  Problem Relation Age of Onset   Diabetes Mother    Hypertension Mother    Esophageal cancer Father    Hypertension Father    Hyperlipidemia Father    Throat cancer Father    Alcohol abuse Brother    Brain cancer Maternal Grandfather    Stomach cancer Paternal Grandfather    Colon cancer Neg Hx    Rectal cancer Neg Hx    Past Surgical History:  Procedure Laterality Date   AMPUTATION Right 12/07/2020   Procedure: RIGHT FOOT 5TH RAY AMPUTATION;  Surgeon: [name], MD;  Location: [facility];  Service: Orthopedics;  Laterality: Right;   back lipoma resection  03/2006   BIOPSY  06/15/2018   Procedure: BIOPSY;  Surgeon: Jerene Bears, MD;  Location: [place];  Service: Gastroenterology;;   CARDIAC CATHETERIZATION  ESOPHAGOGASTRODUODENOSCOPY (EGD) WITH PROPOFOL N/A 06/15/2018   Procedure: ESOPHAGOGASTRODUODENOSCOPY (EGD) WITH PROPOFOL;  Surgeon: Jerene Bears, MD;  Location: [facility];  Service: Gastroenterology;  Laterality: N/A;   KNEE ARTHROSCOPY Left 10/2008   Meniscus Repair   KNEE ARTHROSCOPY W/ ACL RECONSTRUCTION Left 1998   LEFT HEART CATHETERIZATION WITH CORONARY ANGIOGRAM N/A 08/11/2013   Procedure: LEFT HEART CATHETERIZATION WITH CORONARY ANGIOGRAM;  Surgeon: Pixie Casino, MD;  Location: [facility] CATH LAB;  Service: Cardiovascular;  Laterality: N/A;   LOWER EXTREMITY ANGIOGRAPHY Right 11/10/2020   Procedure: LOWER EXTREMITY ANGIOGRAPHY;  Surgeon: Algernon Huxley, MD;  Location: [place] CV LAB;  Service: Cardiovascular;  Laterality: Right;   MENISCUS REPAIR Left 2001   TOTAL KNEE  ARTHROPLASTY Left    UPPER GI ENDOSCOPY  10/2007   showed gastritis   Social History   Occupational History   Occupation: disability  Tobacco Use   Smoking status: Former    Packs/day: 0.50    Years: 20.00    Total pack years: 10.00    Types: Cigarettes    Quit date: 03/23/2016    Years since quitting: 6.7   Smokeless tobacco: Current    Types: Chew   Tobacco comments:     chews daily  Vaping Use   Vaping Use: Never used  Substance and Sexual Activity   Alcohol use: No    Comment: Recovered alcoholic   Drug use: No   Sexual activity: Not on file

## 2023-01-09 ENCOUNTER — Encounter: Payer: Self-pay | Admitting: Sports Medicine

## 2023-01-09 ENCOUNTER — Ambulatory Visit: Payer: Medicare HMO | Admitting: Sports Medicine

## 2023-01-09 DIAGNOSIS — M7711 Lateral epicondylitis, right elbow: Secondary | ICD-10-CM

## 2023-01-09 DIAGNOSIS — M7712 Lateral epicondylitis, left elbow: Secondary | ICD-10-CM | POA: Diagnosis not present

## 2023-01-09 NOTE — Progress Notes (Signed)
[name] - 52 y.o. male MRN [id number]  Date of birth: July 13, 1971  Office Visit Note: Visit Date: 01/09/2023 PCP: Donnajean Lopes, MD Referred by: Donnajean Lopes, MD  Subjective: Chief Complaint  Patient presents with   Right Elbow - Follow-up   Left Elbow - Follow-up   HPI: Grant [name] is a pleasant 52 y.o. male who presents today for bilateral elbow pain, lateral epicondylitis.  ECSWT #4 today Continues finding benefit with the shockwave treatments, he does continue his home rehab exercises.  Has not yet got back into the weight room.  Is considering purchasing counterforce straps over-the-counter. He did work a concert recently having to [employer] which always exacerbates his pain.  Has Celebrex to take once to twice daily as needed.  In general, feels like he is about 60% improved from our initial visit.  Pertinent ROS were reviewed with the patient and found to be negative unless otherwise specified above in HPI.   Assessment & Plan: Visit Diagnoses:  1. Lateral epicondylitis, left elbow   2. Lateral epicondylitis, right elbow    Plan: Discussed with [name] that we are still continuing to find benefit from the shockwave treatment, Celebrex, and his home rehab exercises--I would like him to continue these treatments.  This has been a chronic condition, so we did discuss this does take time to continue to improve but he is making progress as he feels he is about 60% improved from our initial visit.  We will continue shockwave treatments about 1 week apart.  We did discuss the role for ultrasound-guided lateral epicondyle injections as a one-time injection to see if we can get him over the hump.  He will consider this for future visits.  Follow-up in 1 week.  Follow-up: Return in about 1 week (around 01/16/2023) for bilateral elbow pain (reg visit).   Meds & Orders: No orders of the defined types were placed in this encounter.  No orders of the defined types were  placed in this encounter.    Procedures: Procedure: ECSWT Indications:  Lateral epicondylitis, bilateral   Procedure Details Consent: Risks of procedure as well as the alternatives and risks of each were explained to the patient.  Verbal consent for procedure obtained. Time Out: Verified patient identification, verified procedure, site was marked, verified correct patient position. The area was cleaned with alcohol swab.     The Left lateral epicondyle and common extensor tendon musculature was targeted for Extracorporeal shockwave therapy.    Preset: Lateral epicondylitis Power Level: 120 mJ Frequency: 10Hz$  Impulse/cycles: 2000 Head size: Regular   The Right lateral epicondyle and common extensor tendon musculature was targeted for Extracorporeal shockwave therapy.    Preset: Lateral epicondylitis Power Level: 120 mJ Frequency: 10Hz$  Impulse/cycles: 2000 Head size: Regular   Patient tolerated procedures well without immediate complications.       Clinical History: No specialty comments available.  He reports that he quit smoking about 6 years ago. His smoking use included cigarettes. He has a 10.00 pack-year smoking history. His smokeless tobacco use includes chew. No results for input(s): "HGBA1C", "LABURIC" in the last 8760 hours.  Objective:    Physical Exam  Gen: Well-appearing, in no acute distress; non-toxic CV:  Well-perfused. Warm.  Resp: Breathing unlabored on room air; no wheezing. Psych: Fluid speech in conversation; appropriate affect; normal thought process Neuro: Sensation intact throughout. No gross coordination deficits.   Ortho Exam - Bilateral elbows: Mild TTP over the lateral epicondyle and just  distal to knee, right greater than left.  There is certainly less pain with provocative maneuvers such as third finger extension and resisted pronation and supination.  No weakness of grip strength.  No overlying swelling or redness.  Full active and passive  range of motion about the elbows. + Pain with 3rd finger resisted extension.  Imaging: No results found.  Past Medical/Family/Surgical/Social History: Medications & Allergies reviewed per EMR, new medications updated. Patient Active Problem List   Diagnosis Date Noted   Asymmetric blood pressures 10/16/2022   PAD (peripheral artery disease) ([place]) 07/17/2022   Osteomyelitis of fifth toe of right foot ([place])    Alcohol use disorder, severe, dependence ([place]) 04/16/2019   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[place]) 04/16/2019   Cannabis hyperemesis syndrome concurrent with and due to cannabis abuse ([place]) 08/01/2018   Gastroesophageal reflux disease 07/17/2018   Long QT interval 07/16/2018   Early satiety    Esophagitis, [place] grade D    Hypercalcemia 06/09/2018   SIRS (systemic inflammatory response syndrome) ([place]) 06/09/2018   Hematemesis 06/09/2018   Type 2 diabetes mellitus with peripheral neuropathy ([place]) 06/09/2018   Closed displaced fracture of first metatarsal bone of left foot 07/31/2017   Acidosis 09/17/2016   Dehydration 09/17/2016   Abdominal pain 09/17/2016   DKA (diabetic ketoacidoses) 09/17/2016   Nausea and vomiting 09/17/2016   IDDM (insulin dependent diabetes mellitus)    Gastroparesis    Diabetic peripheral neuropathy ([place]) 11/30/2015   Chronic low back pain 11/30/2015   Coronary artery spasm ([place]) 08/17/2013   Hyperlipidemia 08/17/2013   Peripheral neuropathy 08/05/2013   Chest pain 08/05/2013   Fatigue 08/05/2013   Tachycardia 08/05/2013   DOE (dyspnea on exertion) 08/05/2013   Diabetes mellitus, insulin dependent (IDDM), uncontrolled 02/09/2008   Anxiety state 02/09/2008   Essential hypertension 02/09/2008   ALCOHOL ABUSE, HX OF 02/09/2008   LIVER FUNCTION TESTS, ABNORMAL, HX OF 02/09/2008   NEOPLASM, BENIGN, ESOPHAGUS 09/16/2007   Gastritis 09/16/2007   Past Medical History:  Diagnosis Date   Alcohol abuse     Anxiety    Arteritic AION (anterior ischemic optic neuropathy)    Chronic low back pain 11/30/2015   Chronic pain    Depression    Diabetic peripheral neuropathy ([place]) 11/30/2015   Gastric ulcer    Hypertension    Lumbar radiculopathy    Neuropathy    Sleep apnea    Type 2 diabetes mellitus ([place])    Family History  Problem Relation Age of Onset   Diabetes Mother    Hypertension Mother    Esophageal cancer Father    Hypertension Father    Hyperlipidemia Father    Throat cancer Father    Alcohol abuse Brother    Brain cancer Maternal Grandfather    Stomach cancer Paternal Grandfather    Colon cancer Neg Hx    Rectal cancer Neg Hx    Past Surgical History:  Procedure Laterality Date   AMPUTATION Right 12/07/2020   Procedure: RIGHT FOOT 5TH RAY AMPUTATION;  Surgeon: [name], MD;  Location: [facility];  Service: Orthopedics;  Laterality: Right;   back lipoma resection  03/2006   BIOPSY  06/15/2018   Procedure: BIOPSY;  Surgeon: Jerene Bears, MD;  Location: [facility];  Service: Gastroenterology;;   CARDIAC CATHETERIZATION     ESOPHAGOGASTRODUODENOSCOPY (EGD) WITH PROPOFOL N/A 06/15/2018   Procedure: ESOPHAGOGASTRODUODENOSCOPY (EGD) WITH PROPOFOL;  Surgeon: Jerene Bears, MD;  Location: [facility];  Service:  Gastroenterology;  Laterality: N/A;   KNEE ARTHROSCOPY Left 10/2008   Meniscus Repair   KNEE ARTHROSCOPY W/ ACL RECONSTRUCTION Left 1998   LEFT HEART CATHETERIZATION WITH CORONARY ANGIOGRAM N/A 08/11/2013   Procedure: LEFT HEART CATHETERIZATION WITH CORONARY ANGIOGRAM;  Surgeon: Pixie Casino, MD;  Location: [facility] CATH LAB;  Service: Cardiovascular;  Laterality: N/A;   LOWER EXTREMITY ANGIOGRAPHY Right 11/10/2020   Procedure: LOWER EXTREMITY ANGIOGRAPHY;  Surgeon: Algernon Huxley, MD;  Location: [facility] CV LAB;  Service: Cardiovascular;  Laterality: Right;   MENISCUS REPAIR Left 2001   TOTAL KNEE ARTHROPLASTY Left    UPPER GI ENDOSCOPY  10/2007   showed  gastritis   Social History   Occupational History   Occupation: disability  Tobacco Use   Smoking status: Former    Packs/day: 0.50    Years: 20.00    Total pack years: 10.00    Types: Cigarettes    Quit date: 03/23/2016    Years since quitting: 6.8   Smokeless tobacco: Current    Types: Chew   Tobacco comments:     chews daily  Vaping Use   Vaping Use: Never used  Substance and Sexual Activity   Alcohol use: No    Comment: Recovered alcoholic   Drug use: No   Sexual activity: Not on file

## 2023-01-10 ENCOUNTER — Other Ambulatory Visit: Payer: Self-pay

## 2023-01-10 ENCOUNTER — Ambulatory Visit: Payer: Medicare HMO | Attending: Orthopedic Surgery | Admitting: Physical Therapy

## 2023-01-10 ENCOUNTER — Encounter: Payer: Self-pay | Admitting: Physical Therapy

## 2023-01-10 DIAGNOSIS — M542 Cervicalgia: Secondary | ICD-10-CM | POA: Insufficient documentation

## 2023-01-10 DIAGNOSIS — M6281 Muscle weakness (generalized): Secondary | ICD-10-CM | POA: Diagnosis not present

## 2023-01-10 DIAGNOSIS — M25511 Pain in right shoulder: Secondary | ICD-10-CM | POA: Diagnosis not present

## 2023-01-10 DIAGNOSIS — M47812 Spondylosis without myelopathy or radiculopathy, cervical region: Secondary | ICD-10-CM | POA: Insufficient documentation

## 2023-01-10 DIAGNOSIS — G8929 Other chronic pain: Secondary | ICD-10-CM | POA: Diagnosis not present

## 2023-01-10 DIAGNOSIS — M5412 Radiculopathy, cervical region: Secondary | ICD-10-CM | POA: Diagnosis not present

## 2023-01-10 NOTE — Therapy (Signed)
OUTPATIENT PHYSICAL THERAPY CERVICAL EVALUATION   Patient Name: Grant [name] MRN: [id number] DOB:11-Dec-1970, 52 y.o., male Today's Date: 01/10/2023  END OF SESSION:  PT End of Session - 01/10/23 1217     Visit Number 1    Number of Visits 9    Date for PT Re-Evaluation 03/07/23    Authorization Type [employer] MCR    Progress Note Due on Visit 10    PT Start Time 1100    PT Stop Time 1145    PT Time Calculation (min) 45 min    Activity Tolerance Patient tolerated treatment well    Behavior During Therapy [facility] for tasks assessed/performed             Past Medical History:  Diagnosis Date   Alcohol abuse    Anxiety    Arteritic AION (anterior ischemic optic neuropathy)    Chronic low back pain 11/30/2015   Chronic pain    Depression    Diabetic peripheral neuropathy ([place]) 11/30/2015   Gastric ulcer    Hypertension    Lumbar radiculopathy    Neuropathy    Sleep apnea    Type 2 diabetes mellitus ([place])    Past Surgical History:  Procedure Laterality Date   AMPUTATION Right 12/07/2020   Procedure: RIGHT FOOT 5TH RAY AMPUTATION;  Surgeon: [name], MD;  Location: [facility];  Service: Orthopedics;  Laterality: Right;   back lipoma resection  03/2006   BIOPSY  06/15/2018   Procedure: BIOPSY;  Surgeon: Jerene Bears, MD;  Location: [facility];  Service: Gastroenterology;;   CARDIAC CATHETERIZATION     ESOPHAGOGASTRODUODENOSCOPY (EGD) WITH PROPOFOL N/A 06/15/2018   Procedure: ESOPHAGOGASTRODUODENOSCOPY (EGD) WITH PROPOFOL;  Surgeon: Jerene Bears, MD;  Location: [facility] ENDOSCOPY;  Service: Gastroenterology;  Laterality: N/A;   KNEE ARTHROSCOPY Left 10/2008   Meniscus Repair   KNEE ARTHROSCOPY W/ ACL RECONSTRUCTION Left 1998   LEFT HEART CATHETERIZATION WITH CORONARY ANGIOGRAM N/A 08/11/2013   Procedure: LEFT HEART CATHETERIZATION WITH CORONARY ANGIOGRAM;  Surgeon: Pixie Casino, MD;  Location: [facility] CATH LAB;  Service: Cardiovascular;  Laterality: N/A;   LOWER EXTREMITY  ANGIOGRAPHY Right 11/10/2020   Procedure: LOWER EXTREMITY ANGIOGRAPHY;  Surgeon: Algernon Huxley, MD;  Location: [facility] CV LAB;  Service: Cardiovascular;  Laterality: Right;   MENISCUS REPAIR Left 2001   TOTAL KNEE ARTHROPLASTY Left    UPPER GI ENDOSCOPY  10/2007   showed gastritis   Patient Active Problem List   Diagnosis Date Noted   Asymmetric blood pressures 10/16/2022   PAD (peripheral artery disease) ([place]) 07/17/2022   Osteomyelitis of fifth toe of right foot ([place])    Alcohol use disorder, severe, dependence ([place]) 04/16/2019   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[place]) 04/16/2019   Cannabis hyperemesis syndrome concurrent with and due to cannabis abuse ([place]) 08/01/2018   Gastroesophageal reflux disease 07/17/2018   Long QT interval 07/16/2018   Early satiety    Esophagitis, [place] grade D    Hypercalcemia 06/09/2018   SIRS (systemic inflammatory response syndrome) ([place]) 06/09/2018   Hematemesis 06/09/2018   Type 2 diabetes mellitus with peripheral neuropathy ([place]) 06/09/2018   Closed displaced fracture of first metatarsal bone of left foot 07/31/2017   Acidosis 09/17/2016   Dehydration 09/17/2016   Abdominal pain 09/17/2016   DKA (diabetic ketoacidoses) 09/17/2016   Nausea and vomiting 09/17/2016   IDDM (insulin dependent diabetes mellitus)    Gastroparesis    Diabetic peripheral neuropathy ([place]) 11/30/2015  Chronic low back pain 11/30/2015   Coronary artery spasm ([place]) 08/17/2013   Hyperlipidemia 08/17/2013   Peripheral neuropathy 08/05/2013   Chest pain 08/05/2013   Fatigue 08/05/2013   Tachycardia 08/05/2013   DOE (dyspnea on exertion) 08/05/2013   Diabetes mellitus, insulin dependent (IDDM), uncontrolled 02/09/2008   Anxiety state 02/09/2008   Essential hypertension 02/09/2008   ALCOHOL ABUSE, HX OF 02/09/2008   LIVER FUNCTION TESTS, ABNORMAL, HX OF 02/09/2008   NEOPLASM, BENIGN, ESOPHAGUS 09/16/2007   Gastritis  09/16/2007    PCP: Donnajean Lopes, MD  REFERRING PROVIDER: Geronimo Boot, PA-C  REFERRING DIAG: Cervical radiculopathy, Cervical spondylosis  THERAPY DIAG:  Cervicalgia  Chronic right shoulder pain  Muscle weakness (generalized)  Rationale for Evaluation and Treatment: Rehabilitation  ONSET DATE: March 2023   SUBJECTIVE:                 SUBJECTIVE STATEMENT: Patient reports he can't turn his neck much due to pain. It started about a year ago (March 2023) and has progressively gotten worse, no specific mechanism of injury. 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 Denies any pain when sitting still. He is also having some right shoulder weakness and pain, that started up about 6 months ago. He notes about 6 years ago he almost fell out of a chair and braced himself using the right arm, causing some of the right shoulder pain, but that pain did go away. He reports that the pain does not affect his sleep. Patient is supposed to get scheduled for some injections in the neck. He is also working through bilateral tennis elbow and getting treatment for that.  Patient is right handed.  PERTINENT HISTORY:  DM with LE/UE peripheral neuropathy  PAIN:  Are you having pain? Yes:  NPRS scale: 2/10 (8/10 at worst) Pain location: Neck Pain description: Tightness, sharp Aggravating factors: Turning neck, looking up Relieving factors: None  PRECAUTIONS: None  WEIGHT BEARING RESTRICTIONS: No  FALLS:  Has patient fallen in last 6 months? 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 Generalized cervical spine degeneration as described. Active facet arthritis on the right at C6-7 and left at  C7-T1. Mild C7-T1 anterolisthesis. 2. Foraminal stenosis on the right at C4-5 to C6-7. 3. Left foraminal stenosis at C5-6 and C6-7. 4. Partially covered left mastoid opacification.  PATIENT SURVEYS:  FOTO 45% functional status  COGNITION: Overall cognitive status: Within functional limits for tasks assessed  SENSATION: Dermatomes grossly WFL  POSTURE:   Rounded shoulder and forward head posture  PALPATION: Tender to palpation bilateral cervical paraspinals, right upper trap   CERVICAL ROM:   Active ROM A/PROM (deg) eval  Flexion 45  Extension 25  Right lateral flexion 10  Left lateral flexion 5  Right rotation 50  Left rotation 40   (Blank rows = not tested)  Patient reports neck tightness with all cervical motion  UPPER EXTREMITY ROM:  Active ROM Right eval Left eval  Shoulder flexion 130 150  Shoulder extension    Shoulder abduction    Shoulder adduction    Shoulder extension    Shoulder internal rotation    Shoulder external rotation    Elbow  flexion    Elbow extension    Wrist flexion    Wrist extension    Wrist ulnar deviation    Wrist radial deviation    Wrist pronation    Wrist supination     (Blank rows = not tested)  UPPER EXTREMITY MMT:  MMT Right eval Left eval  Shoulder flexion 4 5  Shoulder extension    Shoulder abduction    Shoulder adduction    Shoulder extension    Shoulder internal rotation    Shoulder external rotation 4 5  Middle trapezius 4- 4-  Lower trapezius 4- 4-  Elbow flexion    Elbow extension    Wrist flexion    Wrist extension    Wrist ulnar deviation    Wrist radial deviation    Wrist pronation    Wrist supination    Grip strength     (Blank rows = not tested)  Myotomes grossly WFL  CERVICAL SPECIAL TESTS:  Radicular testing negative for reproduction of shoulder pain, he does report neck pain and tightness that is most likely facet and muscular related  FUNCTIONAL TESTS:  DNF endurance: 14  seconds   TODAY'S TREATMENT:      [facility] Adult PT Treatment:                                                DATE: 01/10/2023 Therapeutic Exercise: Supine chin tuck with towel roll under neck 10 x 5 sec Standing chin tuck at wall 10 x 5 sec Seated double ER and scap retraction with blue x 15  PATIENT EDUCATION:  Education details: Exam findings, POC, HEP Person educated: Patient Education method: Explanation, Demonstration, Tactile cues, Verbal cues, and Handouts Education comprehension: verbalized understanding, returned demonstration, verbal cues required, tactile cues required, and needs further education  HOME EXERCISE PROGRAM: Access Code: [email or web address]    ASSESSMENT: CLINICAL IMPRESSION: Patient is a 52 y.o. male who was seen today for physical therapy evaluation and treatment for chronic neck and right shoulder pain. His neck pain seems to be most consistent with spinal hypomobility, muscular tightness, and postural deficits with limitations in DNF endurance and periscapular strength. He also report right shoulder pain that seems to be related to rotator cuff pain.    OBJECTIVE IMPAIRMENTS: decreased activity tolerance, decreased endurance, decreased ROM, decreased strength, hypomobility, postural dysfunction, and pain.   ACTIVITY LIMITATIONS: carrying, lifting, and sitting  PARTICIPATION LIMITATIONS: driving and occupation  PERSONAL FACTORS: Past/current experiences and Time since onset of injury/illness/exacerbation are also affecting patient's functional outcome.   REHAB POTENTIAL: Good  CLINICAL DECISION MAKING: Stable/uncomplicated  EVALUATION COMPLEXITY: Low   GOALS: Goals reviewed with patient? Yes  SHORT TERM GOALS: Target date: 02/07/2023  Patient will be I with initial HEP in order to progress with therapy. Baseline: HEP provided at eval Goal status: INITIAL  2.  PT will review FOTO with patient by 3rd visit in order to understand expected progress and outcome  with therapy. Baseline: FOTO assessed at eval Goal status: INITIAL  3.  Patient will report neck pain </= 5/10 with turning neck while at work to reduce functional limitations Baseline: 8/10 pain Goal status: INITIAL  LONG TERM GOALS: Target date: 03/07/2023  Patient will be I with final HEP to maintain progress from PT. Baseline: HEP provided at eval Goal status: INITIAL  2.  Patient will report >/=  61% status on FOTO to indicate improved functional ability. Baseline: 45% functional status Goal status: INITIAL  3.  Patient will demonstrate bilateral cervical rotation >/= 60 deg in order to improve driving and ability to perform work tasks Baseline: cervical rotation right 50 deg, left 40 deg Goal status: INITIAL  4.  Patient will demonstrate periscapular strength >/= 4/5 MMT in order to improve postural control with sitting and walking Baseline: grossly 4-/5 MMT Goal status: INITIAL  5.  Patient will report neck pain </= 2/10 with turning neck while at work to reduce functional limitations Baseline: 8/10 pain Goal status: INITIAL   PLAN: PT FREQUENCY: 1x/week  PT DURATION: 8 weeks  PLANNED INTERVENTIONS: Therapeutic exercises, Therapeutic activity, Neuromuscular re-education, Balance training, Gait training, Patient/Family education, Self Care, Joint mobilization, Joint manipulation, Aquatic Therapy, Dry Needling, Electrical stimulation, Spinal manipulation, Spinal mobilization, Cryotherapy, Moist heat, Taping, Manual therapy, and Re-evaluation  PLAN FOR NEXT SESSION: Review HEP and progress PRN, manual/dry needling for cervical paraspinals and upper trap region, mobs for cervical spine, progress DNF endurance and periscapular strengthening for postural control, incorporate pec stretching and thoracic mobility exercises   Hilda Blades, PT, DPT, LAT, ATC 01/10/23  1:03 PM Phone: [phone] Fax: [phone]    Referring diagnosis? M54.12 Treatment diagnosis? (if  different than referring diagnosis) M54.2 What was this (referring dx) caused by? []$  Surgery []$  Fall [x]$  Ongoing issue [x]$  Arthritis []$  Other: ____________  Laterality: []$  Rt []$  Lt [x]$  Both  Check all possible CPT codes:  *CHOOSE 10 OR LESS*    []$  97110 (Therapeutic Exercise)  []$  92507 (SLP Treatment)  []$  97112 (Neuro Re-ed)   []$  92526 (Swallowing Treatment)   []$  97116 (Gait Training)   []$  [id number] (Cognitive Training, 1st 15 minutes) []$  97140 (Manual Therapy)   []$  97130 (Cognitive Training, each add'l 15 minutes)  []$  97164 (Re-evaluation)                              []$  Other, List CPT Code ____________  []$  97530 (Therapeutic Activities)     []$  97535 (Self Care)   [x]$  All codes above (97110 - 97535)  []$  97012 (Mechanical Traction)  [x]$  97014 (E-stim Unattended)  [x]$  97032 (E-stim manual)  []$  97033 (Ionto)  []$  97035 (Ultrasound) []$  97750 (Physical Performance Training) []$  [id number] (Aquatic Therapy) []$  97016 (Vasopneumatic Device) []$  [id number] (Paraffin) []$  97034 (Contrast Bath) []$  97597 (Wound Care 1st 20 sq cm) []$  13086 (Wound Care each add'l 20 sq cm) []$  97760 (Orthotic Fabrication, Fitting, Training Initial) []$  [id number] (Prosthetic Management and Training Initial) []$  [id number] (Orthotic or Prosthetic Training/ Modification Subsequent)

## 2023-01-10 NOTE — Patient Instructions (Signed)
Access Code: [email or web address] URL: [email or web address] Date: 01/10/2023 Prepared by: Hilda Blades  Exercises - Supine Cervical Retraction with Towel  - 1-2 x daily - 2 sets - 10 reps - 5 seconds hold - Cervical Retraction at Wall  - 1-2 x daily - 2 sets - 10 reps - 5 seconds hold - Shoulder External Rotation and Scapular Retraction with Resistance  - 1-2 x daily - 2 sets - 15 reps

## 2023-01-11 ENCOUNTER — Telehealth: Payer: Self-pay | Admitting: Orthopedic Surgery

## 2023-01-11 DIAGNOSIS — M47812 Spondylosis without myelopathy or radiculopathy, cervical region: Secondary | ICD-10-CM

## 2023-01-11 DIAGNOSIS — M5412 Radiculopathy, cervical region: Secondary | ICD-10-CM

## 2023-01-11 NOTE — Telephone Encounter (Signed)
I sent referral to [facility] in [facility] for cervical injections. They do not do them.   Please let him know this. I have placed new referral for pain management ([facility]) in [place]. They should be calling him to schedule.

## 2023-01-11 NOTE — Telephone Encounter (Signed)
Patient aware that the first referral for cervical injection was declined and new referral sent to [facility].

## 2023-01-16 ENCOUNTER — Ambulatory Visit: Payer: Medicare HMO | Admitting: Physical Therapy

## 2023-01-16 ENCOUNTER — Encounter: Payer: Self-pay | Admitting: Physical Therapy

## 2023-01-16 DIAGNOSIS — M6281 Muscle weakness (generalized): Secondary | ICD-10-CM

## 2023-01-16 DIAGNOSIS — G8929 Other chronic pain: Secondary | ICD-10-CM

## 2023-01-16 DIAGNOSIS — M25511 Pain in right shoulder: Secondary | ICD-10-CM | POA: Diagnosis not present

## 2023-01-16 DIAGNOSIS — M5412 Radiculopathy, cervical region: Secondary | ICD-10-CM | POA: Diagnosis not present

## 2023-01-16 DIAGNOSIS — M542 Cervicalgia: Secondary | ICD-10-CM | POA: Diagnosis not present

## 2023-01-16 DIAGNOSIS — M47812 Spondylosis without myelopathy or radiculopathy, cervical region: Secondary | ICD-10-CM | POA: Diagnosis not present

## 2023-01-16 NOTE — Therapy (Signed)
OUTPATIENT PHYSICAL THERAPY TREATMENT NOTE   Patient Name: Grant [name] MRN: [id number] DOB:1971-10-17, 52 y.o., male Today's Date: 01/16/2023  PCP: Donnajean Lopes, MD   REFERRING PROVIDER: Geronimo Boot, PA-C   PT End of Session - 01/16/23 1301     Visit Number 2    Number of Visits 9    Date for PT Re-Evaluation 03/07/23    Authorization Type [employer] MCR    Progress Note Due on Visit 10    PT Start Time 1300    PT Stop Time 1341    PT Time Calculation (min) 41 min    Activity Tolerance Patient tolerated treatment well    Behavior During Therapy [facility] for tasks assessed/performed             Past Medical History:  Diagnosis Date   Alcohol abuse    Anxiety    Arteritic AION (anterior ischemic optic neuropathy)    Chronic low back pain 11/30/2015   Chronic pain    Depression    Diabetic peripheral neuropathy ([place]) 11/30/2015   Gastric ulcer    Hypertension    Lumbar radiculopathy    Neuropathy    Sleep apnea    Type 2 diabetes mellitus ([place])    Past Surgical History:  Procedure Laterality Date   AMPUTATION Right 12/07/2020   Procedure: RIGHT FOOT 5TH RAY AMPUTATION;  Surgeon: [name], MD;  Location: [place];  Service: Orthopedics;  Laterality: Right;   back lipoma resection  03/2006   BIOPSY  06/15/2018   Procedure: BIOPSY;  Surgeon: Jerene Bears, MD;  Location: [facility] ENDOSCOPY;  Service: Gastroenterology;;   CARDIAC CATHETERIZATION     ESOPHAGOGASTRODUODENOSCOPY (EGD) WITH PROPOFOL N/A 06/15/2018   Procedure: ESOPHAGOGASTRODUODENOSCOPY (EGD) WITH PROPOFOL;  Surgeon: Jerene Bears, MD;  Location: [facility] ENDOSCOPY;  Service: Gastroenterology;  Laterality: N/A;   KNEE ARTHROSCOPY Left 10/2008   Meniscus Repair   KNEE ARTHROSCOPY W/ ACL RECONSTRUCTION Left 1998   LEFT HEART CATHETERIZATION WITH CORONARY ANGIOGRAM N/A 08/11/2013   Procedure: LEFT HEART CATHETERIZATION WITH CORONARY ANGIOGRAM;  Surgeon: Pixie Casino, MD;  Location: [facility] CATH LAB;  Service:  Cardiovascular;  Laterality: N/A;   LOWER EXTREMITY ANGIOGRAPHY Right 11/10/2020   Procedure: LOWER EXTREMITY ANGIOGRAPHY;  Surgeon: Algernon Huxley, MD;  Location: [place] CV LAB;  Service: Cardiovascular;  Laterality: Right;   MENISCUS REPAIR Left 2001   TOTAL KNEE ARTHROPLASTY Left    UPPER GI ENDOSCOPY  10/2007   showed gastritis   Patient Active Problem List   Diagnosis Date Noted   Asymmetric blood pressures 10/16/2022   PAD (peripheral artery disease) ([place]) 07/17/2022   Osteomyelitis of fifth toe of right foot ([place])    Alcohol use disorder, severe, dependence ([place]) 04/16/2019   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[place]) 04/16/2019   Cannabis hyperemesis syndrome concurrent with and due to cannabis abuse ([place]) 08/01/2018   Gastroesophageal reflux disease 07/17/2018   Long QT interval 07/16/2018   Early satiety    Esophagitis, [place] grade D    Hypercalcemia 06/09/2018   SIRS (systemic inflammatory response syndrome) ([place]) 06/09/2018   Hematemesis 06/09/2018   Type 2 diabetes mellitus with peripheral neuropathy ([place]) 06/09/2018   Closed displaced fracture of first metatarsal bone of left foot 07/31/2017   Acidosis 09/17/2016   Dehydration 09/17/2016   Abdominal pain 09/17/2016   DKA (diabetic ketoacidoses) 09/17/2016   Nausea and vomiting 09/17/2016   IDDM (insulin dependent diabetes mellitus)  Gastroparesis    Diabetic peripheral neuropathy ([place]) 11/30/2015   Chronic low back pain 11/30/2015   Coronary artery spasm ([place]) 08/17/2013   Hyperlipidemia 08/17/2013   Peripheral neuropathy 08/05/2013   Chest pain 08/05/2013   Fatigue 08/05/2013   Tachycardia 08/05/2013   DOE (dyspnea on exertion) 08/05/2013   Diabetes mellitus, insulin dependent (IDDM), uncontrolled 02/09/2008   Anxiety state 02/09/2008   Essential hypertension 02/09/2008   ALCOHOL ABUSE, HX OF 02/09/2008   LIVER FUNCTION TESTS, ABNORMAL, HX OF 02/09/2008    NEOPLASM, BENIGN, ESOPHAGUS 09/16/2007   Gastritis 09/16/2007    THERAPY DIAG:  Cervicalgia  Chronic right shoulder pain  Muscle weakness (generalized)   Rationale for Evaluation and Treatment Rehabilitation  REFERRING DIAG: Cervical radiculopathy, Cervical spondylosis   PERTINENT HISTORY: DM with LE/UE peripheral neuropathy   PRECAUTIONS/RESTRICTIONS:   no  SUBJECTIVE:  Pt reports that he has not had too much improvement in his neck pain so far.  The chin tuck at the wall causes an increase in R shoulder/arm numbness so this was D/C.  PAIN:  Are you having pain? Yes:  NPRS scale: 2/10 (8/10 at worst) Pain location: Neck Pain description: Tightness, sharp Aggravating factors: Turning neck, looking up Relieving factors: None  OBJECTIVE: (objective measures completed at initial evaluation unless otherwise dated)  DIAGNOSTIC FINDINGS:  Cervical MRI 12/24/2022 IMPRESSION: 1. Generalized cervical spine degeneration as described. Active facet arthritis on the right at C6-7 and left at C7-T1. Mild C7-T1 anterolisthesis. 2. Foraminal stenosis on the right at C4-5 to C6-7. 3. Left foraminal stenosis at C5-6 and C6-7. 4. Partially covered left mastoid opacification.   PATIENT SURVEYS:  FOTO 45% functional status   COGNITION: Overall cognitive status: Within functional limits for tasks assessed   SENSATION: Dermatomes grossly WFL   POSTURE:             Rounded shoulder and forward head posture   PALPATION: Tender to palpation bilateral cervical paraspinals, right upper trap        CERVICAL ROM:    Active ROM A/PROM (deg) eval  Flexion 45  Extension 25  Right lateral flexion 10  Left lateral flexion 5  Right rotation 50  Left rotation 40   (Blank rows = not tested)   Patient reports neck tightness with all cervical motion   UPPER EXTREMITY ROM:   Active ROM Right eval Left eval  Shoulder flexion 130 150  Shoulder extension      Shoulder abduction       Shoulder adduction      Shoulder extension      Shoulder internal rotation      Shoulder external rotation      Elbow flexion      Elbow extension      Wrist flexion      Wrist extension      Wrist ulnar deviation      Wrist radial deviation      Wrist pronation      Wrist supination       (Blank rows = not tested)   UPPER EXTREMITY MMT:   MMT Right eval Left eval  Shoulder flexion 4 5  Shoulder extension      Shoulder abduction      Shoulder adduction      Shoulder extension      Shoulder internal rotation      Shoulder external rotation 4 5  Middle trapezius 4- 4-  Lower trapezius 4- 4-  Elbow flexion  Elbow extension      Wrist flexion      Wrist extension      Wrist ulnar deviation      Wrist radial deviation      Wrist pronation      Wrist supination      Grip strength       (Blank rows = not tested)   Myotomes grossly WFL   CERVICAL SPECIAL TESTS:  Radicular testing negative for reproduction of shoulder pain, he does report neck pain and tightness that is most likely facet and muscular related   FUNCTIONAL TESTS:  DNF endurance: 14 seconds   HOME EXERCISE PROGRAM: Access Code: [id number]   TREATMENT 2/21:  Therapeutic Exercise: - UBE 2.5'/2.5' fwd and backward for warm up while taking subjective - cervical snags - L/R - chin tuck - reviewing HEP  Manual therapy: Chin tuck with OP Cervical distraction Lateral glides throughout cervical spine Skilled palpation to identify trigger points for TDN STM to all listed muscles following TDN  Trigger Point Dry-Needling  Treatment instructions: Expect mild to moderate muscle soreness. S/S of pneumothorax if dry needled over a lung field, and to seek immediate medical attention should they occur. Patient verbalized understanding of these instructions and education.  Patient Consent Given: Yes Education handout provided: No Muscles treated: bil UT, bil cervical paraspinals, bil sub  occipitals Electrical stimulation performed: No Parameters: N/A Treatment response/outcome: twitch, pain reduction   ASSESSMENT: CLINICAL IMPRESSION: Krishal tolerated session well with no adverse reaction.  Reports significant pain reduction following MT and TDN.  Improvement in cervical ROM following snags.  Pain reduced to 2/10 following therapy.     OBJECTIVE IMPAIRMENTS: decreased activity tolerance, decreased endurance, decreased ROM, decreased strength, hypomobility, postural dysfunction, and pain.    ACTIVITY LIMITATIONS: carrying, lifting, and sitting   PARTICIPATION LIMITATIONS: driving and occupation   PERSONAL FACTORS: Past/current experiences and Time since onset of injury/illness/exacerbation are also affecting patient's functional outcome.    REHAB POTENTIAL: Good   CLINICAL DECISION MAKING: Stable/uncomplicated   EVALUATION COMPLEXITY: Low     GOALS: Goals reviewed with patient? Yes   SHORT TERM GOALS: Target date: 02/07/2023   Patient will be I with initial HEP in order to progress with therapy. Baseline: HEP provided at eval Goal status: INITIAL   2.  PT will review FOTO with patient by 3rd visit in order to understand expected progress and outcome with therapy. Baseline: FOTO assessed at eval Goal status: INITIAL   3.  Patient will report neck pain </= 5/10 with turning neck while at work to reduce functional limitations Baseline: 8/10 pain Goal status: INITIAL   LONG TERM GOALS: Target date: 03/07/2023   Patient will be I with final HEP to maintain progress from PT. Baseline: HEP provided at eval Goal status: INITIAL   2.  Patient will report >/= 61% status on FOTO to indicate improved functional ability. Baseline: 45% functional status Goal status: INITIAL   3.  Patient will demonstrate bilateral cervical rotation >/= 60 deg in order to improve driving and ability to perform work tasks Baseline: cervical rotation right 50 deg, left 40 deg Goal  status: INITIAL   4.  Patient will demonstrate periscapular strength >/= 4/5 MMT in order to improve postural control with sitting and walking Baseline: grossly 4-/5 MMT Goal status: INITIAL   5.  Patient will report neck pain </= 2/10 with turning neck while at work to reduce functional limitations Baseline: 8/10 pain Goal status: INITIAL  PLAN: PT FREQUENCY: 1x/week   PT DURATION: 8 weeks   PLANNED INTERVENTIONS: Therapeutic exercises, Therapeutic activity, Neuromuscular re-education, Balance training, Gait training, Patient/Family education, Self Care, Joint mobilization, Joint manipulation, Aquatic Therapy, Dry Needling, Electrical stimulation, Spinal manipulation, Spinal mobilization, Cryotherapy, Moist heat, Taping, Manual therapy, and Re-evaluation   PLAN FOR NEXT SESSION: Review HEP and progress PRN, manual/dry needling for cervical paraspinals and upper trap region, mobs for cervical spine, progress DNF endurance and periscapular strengthening for postural control, incorporate pec stretching and thoracic mobility exercises   Kevan Ny Elye Harmsen PT 01/16/2023, 1:55 PM

## 2023-01-17 ENCOUNTER — Encounter: Payer: Self-pay | Admitting: Sports Medicine

## 2023-01-17 ENCOUNTER — Ambulatory Visit: Payer: Medicare HMO | Admitting: Sports Medicine

## 2023-01-17 ENCOUNTER — Ambulatory Visit: Payer: Self-pay

## 2023-01-17 DIAGNOSIS — M7712 Lateral epicondylitis, left elbow: Secondary | ICD-10-CM

## 2023-01-17 DIAGNOSIS — M7711 Lateral epicondylitis, right elbow: Secondary | ICD-10-CM

## 2023-01-17 DIAGNOSIS — E1142 Type 2 diabetes mellitus with diabetic polyneuropathy: Secondary | ICD-10-CM

## 2023-01-17 DIAGNOSIS — M25521 Pain in right elbow: Secondary | ICD-10-CM

## 2023-01-17 MED ORDER — BUPIVACAINE HCL 0.25 % IJ SOLN
1.0000 mL | INTRAMUSCULAR | Status: AC | PRN
  Administered 2023-01-17: 1 mL

## 2023-01-17 MED ORDER — LIDOCAINE HCL 1 % IJ SOLN
1.0000 mL | INTRAMUSCULAR | Status: AC | PRN
  Administered 2023-01-17: 1 mL

## 2023-01-17 MED ORDER — BETAMETHASONE SOD PHOS & ACET 6 (3-3) MG/ML IJ SUSP
6.0000 mg | INTRAMUSCULAR | Status: AC | PRN
  Administered 2023-01-17: 6 mg via INTRA_ARTICULAR

## 2023-01-17 NOTE — Progress Notes (Signed)
Not much change or difference from last week.  Inquiring about an injection today; if not able to, then he will schedule for next week

## 2023-01-17 NOTE — Progress Notes (Signed)
Grant [name] - 52 y.o. male MRN [id number]  Date of birth: Apr 24, 1971  Office Visit Note: Visit Date: 01/17/2023 PCP: Donnajean Lopes, MD Referred by: Donnajean Lopes, MD  Subjective: Chief Complaint  Patient presents with   Left Elbow - Follow-up   Right Elbow - Follow-up   HPI: Grant [name] is a pleasant 52 y.o. male who presents today for bilateral elbow pain.  Symptoms are indicative of lateral epicondylitis.  He has been undergoing extracorporeal shockwave treatments, has completed 4 sessions.  Initially was getting some improvement but after his last 1 he feels like this has plateaued.  Has been working more lifting And feels like the right elbow has been exacerbated, his pain is worse than previously.  He continues with his home rehab exercises.  He is taking Celebrex once to twice daily as needed.  Last A1c in the chart was 9.4 on 06/09/18 -however since this time he now has a continuous glucose monitor and reports his sugars have been much better controlled.  He does know to increase his insulin with transient rises from corticosteroid injection.  Pertinent ROS were reviewed with the patient and found to be negative unless otherwise specified above in HPI.   Assessment & Plan: Visit Diagnoses:  1. Pain in right elbow   2. Type 2 diabetes mellitus with peripheral neuropathy (HCC)   3. Lateral epicondylitis, right elbow   4. Lateral epicondylitis, left elbow    Plan: Unfortunately Grant [name] has had an exacerbation of his chronic lateral epicondylitis, he has work with repetitive motion that does exacerbate his pain.  He has been getting benefit from shockwave therapy, however feels like he has plateaued.  Given his worsening pain, through shared decision making elected to proceed with ultrasound-guided lateral epicondyle injection.  Patient tolerated well.  I would like him to increase his Celebrex to 100 mg twice daily scheduled for the next week, he may then return to  once-twice daily as needed following this.  He is also to ice the elbow 2-3 times a day over the coming days for any postinjection pain or after work.  Relative rest for 48 hours, then may return to his home rehab exercises.  Discussed with him if this provided good relief, he may follow-up in 2 weeks and we can consider injection on the contralateral elbow.  He does know to monitor his glucose levels and adjust his insulin for any transient rise in his sugar levels over the course of the next week. F/u in 2 weeks.   Follow-up: Return in about 2 weeks (around 01/31/2023) for for left lateral epi US-guided inj.   Meds & Orders: No orders of the defined types were placed in this encounter.   Orders Placed This Encounter  Procedures   US Guided Needle Placement - No Linked Charges     Procedures: Hand/UE Inj: R elbow for lateral epicondylitis on 01/17/2023 2:41 PM Indications: pain Details: 25 G needle, ultrasound-guided dorsal approach Medications: 1 mL lidocaine 1 %; 1 mL bupivacaine 0.25 %; 6 mg betamethasone acetate-betamethasone sodium phosphate 6 (3-3) MG/ML Outcome: tolerated well, no immediate complications  Procedure: Lateral Epicondylitis (Common Extensor Tendon) Injection, Right Elbow After informed verbal consent was obtained, a timeout was performed.  Patient was lying supine on exam table.  Area overlying the affected lateral epicondyle was prepped with chloraprep and alcohol swabs. Then utilizing ultrasound guidance via an in-plane approach, the patient's lateral epicondyle and common extensor tendon was injected with 1:1:1 lidocaine:bupivicaine:betamethasone  with multiple needle fenestrations from a distal to proximal direction. Patient tolerated procedure well without immediate complications.  Procedure, treatment alternatives, risks and benefits explained, specific risks discussed. Consent was given by the patient. Immediately prior to procedure a time out was called to verify the  correct patient, procedure, equipment, support staff and site/side marked as required. Patient was prepped and draped in the usual sterile fashion.          Clinical History: No specialty comments available.  He reports that he quit smoking about 6 years ago. His smoking use included cigarettes. He has a 10.00 pack-year smoking history. His smokeless tobacco use includes chew. No results for input(s): "HGBA1C", "LABURIC" in the last 8760 hours.  Objective:    Physical Exam  Gen: Well-appearing, in no acute distress; non-toxic CV: Well-perfused. Warm.  Resp: Breathing unlabored on room air; no wheezing. Psych: Fluid speech in conversation; appropriate affect; normal thought process Neuro: Sensation intact throughout. No gross coordination deficits.   Ortho Exam - Bilateral elbows: Positive TTP over the lateral epicondyle and just distal to the base at the proximal common extensor tendon origin.  Right is more tender than the left.  Positive provocative measures such as third finger extension and resisted wrist pronation/supination.  No overlying swelling or redness.   Imaging: No results found.  Past Medical/Family/Surgical/Social History: Medications & Allergies reviewed per EMR, new medications updated. Patient Active Problem List   Diagnosis Date Noted   Asymmetric blood pressures 10/16/2022   PAD (peripheral artery disease) ([place]) 07/17/2022   Osteomyelitis of fifth toe of right foot (HCC)    Alcohol use disorder, severe, dependence ([place]) 04/16/2019   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[place]) 04/16/2019   Cannabis hyperemesis syndrome concurrent with and due to cannabis abuse ([place]) 08/01/2018   Gastroesophageal reflux disease 07/17/2018   Long QT interval 07/16/2018   Early satiety    Esophagitis, [place] grade D    Hypercalcemia 06/09/2018   SIRS (systemic inflammatory response syndrome) ([place]) 06/09/2018   Hematemesis 06/09/2018    Type 2 diabetes mellitus with peripheral neuropathy ([place]) 06/09/2018   Closed displaced fracture of first metatarsal bone of left foot 07/31/2017   Acidosis 09/17/2016   Dehydration 09/17/2016   Abdominal pain 09/17/2016   DKA (diabetic ketoacidoses) 09/17/2016   Nausea and vomiting 09/17/2016   IDDM (insulin dependent diabetes mellitus)    Gastroparesis    Diabetic peripheral neuropathy ([place]) 11/30/2015   Chronic low back pain 11/30/2015   Coronary artery spasm ([place]) 08/17/2013   Hyperlipidemia 08/17/2013   Peripheral neuropathy 08/05/2013   Chest pain 08/05/2013   Fatigue 08/05/2013   Tachycardia 08/05/2013   DOE (dyspnea on exertion) 08/05/2013   Diabetes mellitus, insulin dependent (IDDM), uncontrolled 02/09/2008   Anxiety state 02/09/2008   Essential hypertension 02/09/2008   ALCOHOL ABUSE, HX OF 02/09/2008   LIVER FUNCTION TESTS, ABNORMAL, HX OF 02/09/2008   NEOPLASM, BENIGN, ESOPHAGUS 09/16/2007   Gastritis 09/16/2007   Past Medical History:  Diagnosis Date   Alcohol abuse    Anxiety    Arteritic AION (anterior ischemic optic neuropathy)    Chronic low back pain 11/30/2015   Chronic pain    Depression    Diabetic peripheral neuropathy ([place]) 11/30/2015   Gastric ulcer    Hypertension    Lumbar radiculopathy    Neuropathy    Sleep apnea    Type 2 diabetes mellitus ([place])    Family History  Problem Relation Age of Onset  Diabetes Mother    Hypertension Mother    Esophageal cancer Father    Hypertension Father    Hyperlipidemia Father    Throat cancer Father    Alcohol abuse Brother    Brain cancer Maternal Grandfather    Stomach cancer Paternal Grandfather    Colon cancer Neg Hx    Rectal cancer Neg Hx    Past Surgical History:  Procedure Laterality Date   AMPUTATION Right 12/07/2020   Procedure: RIGHT FOOT 5TH RAY AMPUTATION;  Surgeon: [name], MD;  Location: [place];  Service: Orthopedics;  Laterality: Right;   back lipoma resection  03/2006    BIOPSY  06/15/2018   Procedure: BIOPSY;  Surgeon: Jerene Bears, MD;  Location: [facility] ENDOSCOPY;  Service: Gastroenterology;;   CARDIAC CATHETERIZATION     ESOPHAGOGASTRODUODENOSCOPY (EGD) WITH PROPOFOL N/A 06/15/2018   Procedure: ESOPHAGOGASTRODUODENOSCOPY (EGD) WITH PROPOFOL;  Surgeon: Jerene Bears, MD;  Location: [facility] ENDOSCOPY;  Service: Gastroenterology;  Laterality: N/A;   KNEE ARTHROSCOPY Left 10/2008   Meniscus Repair   KNEE ARTHROSCOPY W/ ACL RECONSTRUCTION Left 1998   LEFT HEART CATHETERIZATION WITH CORONARY ANGIOGRAM N/A 08/11/2013   Procedure: LEFT HEART CATHETERIZATION WITH CORONARY ANGIOGRAM;  Surgeon: Pixie Casino, MD;  Location: [facility] CATH LAB;  Service: Cardiovascular;  Laterality: N/A;   LOWER EXTREMITY ANGIOGRAPHY Right 11/10/2020   Procedure: LOWER EXTREMITY ANGIOGRAPHY;  Surgeon: Algernon Huxley, MD;  Location: [place] CV LAB;  Service: Cardiovascular;  Laterality: Right;   MENISCUS REPAIR Left 2001   TOTAL KNEE ARTHROPLASTY Left    UPPER GI ENDOSCOPY  10/2007   showed gastritis   Social History   Occupational History   Occupation: disability  Tobacco Use   Smoking status: Former    Packs/day: 0.50    Years: 20.00    Total pack years: 10.00    Types: Cigarettes    Quit date: 03/23/2016    Years since quitting: 6.8   Smokeless tobacco: Current    Types: Chew   Tobacco comments:     chews daily  Vaping Use   Vaping Use: Never used  Substance and Sexual Activity   Alcohol use: No    Comment: Recovered alcoholic   Drug use: No   Sexual activity: Not on file

## 2023-01-18 DIAGNOSIS — E1165 Type 2 diabetes mellitus with hyperglycemia: Secondary | ICD-10-CM | POA: Diagnosis not present

## 2023-01-23 ENCOUNTER — Ambulatory Visit: Payer: Medicare HMO | Admitting: Physical Therapy

## 2023-01-23 ENCOUNTER — Encounter: Payer: Self-pay | Admitting: Physical Therapy

## 2023-01-23 DIAGNOSIS — G8929 Other chronic pain: Secondary | ICD-10-CM

## 2023-01-23 DIAGNOSIS — M6281 Muscle weakness (generalized): Secondary | ICD-10-CM

## 2023-01-23 DIAGNOSIS — M542 Cervicalgia: Secondary | ICD-10-CM

## 2023-01-23 DIAGNOSIS — E1165 Type 2 diabetes mellitus with hyperglycemia: Secondary | ICD-10-CM | POA: Diagnosis not present

## 2023-01-23 DIAGNOSIS — M5412 Radiculopathy, cervical region: Secondary | ICD-10-CM | POA: Diagnosis not present

## 2023-01-23 DIAGNOSIS — M25511 Pain in right shoulder: Secondary | ICD-10-CM | POA: Diagnosis not present

## 2023-01-23 DIAGNOSIS — M47812 Spondylosis without myelopathy or radiculopathy, cervical region: Secondary | ICD-10-CM | POA: Diagnosis not present

## 2023-01-23 NOTE — Therapy (Signed)
OUTPATIENT PHYSICAL THERAPY TREATMENT NOTE   Patient Name: Grant [name] MRN: [id number] DOB:04-29-1971, 52 y.o., male Today's Date: 01/23/2023  PCP: Donnajean Lopes, MD   REFERRING PROVIDER: Geronimo Boot, PA-C   PT End of Session - 01/23/23 1046     Visit Number 3    Number of Visits 9    Date for PT Re-Evaluation 03/07/23    Authorization Type [employer] MCR    Progress Note Due on Visit 10    PT Start Time [id number]    PT Stop Time [id number]    PT Time Calculation (min) 41 min    Activity Tolerance Patient tolerated treatment well    Behavior During Therapy [facility] for tasks assessed/performed             Past Medical History:  Diagnosis Date   Alcohol abuse    Anxiety    Arteritic AION (anterior ischemic optic neuropathy)    Chronic low back pain 11/30/2015   Chronic pain    Depression    Diabetic peripheral neuropathy ([place]) 11/30/2015   Gastric ulcer    Hypertension    Lumbar radiculopathy    Neuropathy    Sleep apnea    Type 2 diabetes mellitus ([place])    Past Surgical History:  Procedure Laterality Date   AMPUTATION Right 12/07/2020   Procedure: RIGHT FOOT 5TH RAY AMPUTATION;  Surgeon: [name], MD;  Location: [place];  Service: Orthopedics;  Laterality: Right;   back lipoma resection  03/2006   BIOPSY  06/15/2018   Procedure: BIOPSY;  Surgeon: Jerene Bears, MD;  Location: [facility] ENDOSCOPY;  Service: Gastroenterology;;   CARDIAC CATHETERIZATION     ESOPHAGOGASTRODUODENOSCOPY (EGD) WITH PROPOFOL N/A 06/15/2018   Procedure: ESOPHAGOGASTRODUODENOSCOPY (EGD) WITH PROPOFOL;  Surgeon: Jerene Bears, MD;  Location: [facility] ENDOSCOPY;  Service: Gastroenterology;  Laterality: N/A;   KNEE ARTHROSCOPY Left 10/2008   Meniscus Repair   KNEE ARTHROSCOPY W/ ACL RECONSTRUCTION Left 1998   LEFT HEART CATHETERIZATION WITH CORONARY ANGIOGRAM N/A 08/11/2013   Procedure: LEFT HEART CATHETERIZATION WITH CORONARY ANGIOGRAM;  Surgeon: Pixie Casino, MD;  Location: [facility] CATH LAB;  Service:  Cardiovascular;  Laterality: N/A;   LOWER EXTREMITY ANGIOGRAPHY Right 11/10/2020   Procedure: LOWER EXTREMITY ANGIOGRAPHY;  Surgeon: Algernon Huxley, MD;  Location: [place] CV LAB;  Service: Cardiovascular;  Laterality: Right;   MENISCUS REPAIR Left 2001   TOTAL KNEE ARTHROPLASTY Left    UPPER GI ENDOSCOPY  10/2007   showed gastritis   Patient Active Problem List   Diagnosis Date Noted   Asymmetric blood pressures 10/16/2022   PAD (peripheral artery disease) ([place]) 07/17/2022   Osteomyelitis of fifth toe of right foot ([place])    Alcohol use disorder, severe, dependence ([place]) 04/16/2019   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[place]) 04/16/2019   Cannabis hyperemesis syndrome concurrent with and due to cannabis abuse ([place]) 08/01/2018   Gastroesophageal reflux disease 07/17/2018   Long QT interval 07/16/2018   Early satiety    Esophagitis, [place] grade D    Hypercalcemia 06/09/2018   SIRS (systemic inflammatory response syndrome) ([place]) 06/09/2018   Hematemesis 06/09/2018   Type 2 diabetes mellitus with peripheral neuropathy ([place]) 06/09/2018   Closed displaced fracture of first metatarsal bone of left foot 07/31/2017   Acidosis 09/17/2016   Dehydration 09/17/2016   Abdominal pain 09/17/2016   DKA (diabetic ketoacidoses) 09/17/2016   Nausea and vomiting 09/17/2016   IDDM (insulin dependent diabetes mellitus)  Gastroparesis    Diabetic peripheral neuropathy ([place]) 11/30/2015   Chronic low back pain 11/30/2015   Coronary artery spasm ([place]) 08/17/2013   Hyperlipidemia 08/17/2013   Peripheral neuropathy 08/05/2013   Chest pain 08/05/2013   Fatigue 08/05/2013   Tachycardia 08/05/2013   DOE (dyspnea on exertion) 08/05/2013   Diabetes mellitus, insulin dependent (IDDM), uncontrolled 02/09/2008   Anxiety state 02/09/2008   Essential hypertension 02/09/2008   ALCOHOL ABUSE, HX OF 02/09/2008   LIVER FUNCTION TESTS, ABNORMAL, HX OF 02/09/2008    NEOPLASM, BENIGN, ESOPHAGUS 09/16/2007   Gastritis 09/16/2007    THERAPY DIAG:  Cervicalgia  Chronic right shoulder pain  Muscle weakness (generalized)   Rationale for Evaluation and Treatment Rehabilitation  REFERRING DIAG: Cervical radiculopathy, Cervical spondylosis   PERTINENT HISTORY: DM with LE/UE peripheral neuropathy   PRECAUTIONS/RESTRICTIONS:   no  SUBJECTIVE:  Pt reports that he had significant improvement in his neck pain.  He was a little sore after TDN, but this resolved.  He has been HEP compliant.  PAIN:  Are you having pain? Yes:  NPRS scale: 1/10 (8/10 at worst) Pain location: Neck Pain description: Tightness, sharp Aggravating factors: Turning neck, looking up Relieving factors: None  OBJECTIVE: (objective measures completed at initial evaluation unless otherwise dated)  DIAGNOSTIC FINDINGS:  Cervical MRI 12/24/2022 IMPRESSION: 1. Generalized cervical spine degeneration as described. Active facet arthritis on the right at C6-7 and left at C7-T1. Mild C7-T1 anterolisthesis. 2. Foraminal stenosis on the right at C4-5 to C6-7. 3. Left foraminal stenosis at C5-6 and C6-7. 4. Partially covered left mastoid opacification.   PATIENT SURVEYS:  FOTO 45% functional status   COGNITION: Overall cognitive status: Within functional limits for tasks assessed   SENSATION: Dermatomes grossly WFL   POSTURE:             Rounded shoulder and forward head posture   PALPATION: Tender to palpation bilateral cervical paraspinals, right upper trap        CERVICAL ROM:    Active ROM A/PROM (deg) eval  Flexion 45  Extension 25  Right lateral flexion 10  Left lateral flexion 5  Right rotation 50  Left rotation 40   (Blank rows = not tested)   Patient reports neck tightness with all cervical motion   UPPER EXTREMITY ROM:   Active ROM Right eval Left eval  Shoulder flexion 130 150  Shoulder extension      Shoulder abduction      Shoulder adduction       Shoulder extension      Shoulder internal rotation      Shoulder external rotation      Elbow flexion      Elbow extension      Wrist flexion      Wrist extension      Wrist ulnar deviation      Wrist radial deviation      Wrist pronation      Wrist supination       (Blank rows = not tested)   UPPER EXTREMITY MMT:   MMT Right eval Left eval  Shoulder flexion 4 5  Shoulder extension      Shoulder abduction      Shoulder adduction      Shoulder extension      Shoulder internal rotation      Shoulder external rotation 4 5  Middle trapezius 4- 4-  Lower trapezius 4- 4-  Elbow flexion      Elbow extension  Wrist flexion      Wrist extension      Wrist ulnar deviation      Wrist radial deviation      Wrist pronation      Wrist supination      Grip strength       (Blank rows = not tested)   Myotomes grossly WFL   CERVICAL SPECIAL TESTS:  Radicular testing negative for reproduction of shoulder pain, he does report neck pain and tightness that is most likely facet and muscular related   FUNCTIONAL TESTS:  DNF endurance: 14 seconds   HOME EXERCISE PROGRAM: Access Code: [email or web address]   TREATMENT 2/21:  Therapeutic Exercise: - UBE 2.5'/2.5' fwd and backward for warm up while taking subjective - chin tuck - reviewing HEP including snags (L/R)  Manual therapy: Chin tuck with OP Cervical distraction Lateral glides Cx spine Lateral glides throughout cervical spine Skilled palpation to identify trigger points for TDN STM to all listed muscles following TDN  Trigger Point Dry-Needling  Treatment instructions: Expect mild to moderate muscle soreness. S/S of pneumothorax if dry needled over a lung field, and to seek immediate medical attention should they occur. Patient verbalized understanding of these instructions and education.  Patient Consent Given: Yes Education handout provided: No Muscles treated: bil UT, bil cervical paraspinals, bil sub  occipitals Electrical stimulation performed: No Parameters: N/A Treatment response/outcome: twitch, pain reduction   ASSESSMENT: CLINICAL IMPRESSION: J[place] tolerated session well with no adverse reaction.  We continued combination of manual and TDN; pt responding well to this.  Near resolution of pain following treatment today.  Instructed to continued SNAGS and chin tucks at home for mobility.     OBJECTIVE IMPAIRMENTS: decreased activity tolerance, decreased endurance, decreased ROM, decreased strength, hypomobility, postural dysfunction, and pain.    ACTIVITY LIMITATIONS: carrying, lifting, and sitting   PARTICIPATION LIMITATIONS: driving and occupation   PERSONAL FACTORS: Past/current experiences and Time since onset of injury/illness/exacerbation are also affecting patient's functional outcome.    REHAB POTENTIAL: Good   CLINICAL DECISION MAKING: Stable/uncomplicated   EVALUATION COMPLEXITY: Low     GOALS: Goals reviewed with patient? Yes   SHORT TERM GOALS: Target date: 02/07/2023   Patient will be I with initial HEP in order to progress with therapy. Baseline: HEP provided at eval Goal status: INITIAL   2.  PT will review FOTO with patient by 3rd visit in order to understand expected progress and outcome with therapy. Baseline: FOTO assessed at eval Goal status: INITIAL   3.  Patient will report neck pain </= 5/10 with turning neck while at work to reduce functional limitations Baseline: 8/10 pain Goal status: INITIAL   LONG TERM GOALS: Target date: 03/07/2023   Patient will be I with final HEP to maintain progress from PT. Baseline: HEP provided at eval Goal status: INITIAL   2.  Patient will report >/= 61% status on FOTO to indicate improved functional ability. Baseline: 45% functional status Goal status: INITIAL   3.  Patient will demonstrate bilateral cervical rotation >/= 60 deg in order to improve driving and ability to perform work tasks Baseline:  cervical rotation right 50 deg, left 40 deg Goal status: INITIAL   4.  Patient will demonstrate periscapular strength >/= 4/5 MMT in order to improve postural control with sitting and walking Baseline: grossly 4-/5 MMT Goal status: INITIAL   5.  Patient will report neck pain </= 2/10 with turning neck while at work to reduce functional limitations Baseline: 8/10 pain  Goal status: INITIAL     PLAN: PT FREQUENCY: 1x/week   PT DURATION: 8 weeks   PLANNED INTERVENTIONS: Therapeutic exercises, Therapeutic activity, Neuromuscular re-education, Balance training, Gait training, Patient/Family education, Self Care, Joint mobilization, Joint manipulation, Aquatic Therapy, Dry Needling, Electrical stimulation, Spinal manipulation, Spinal mobilization, Cryotherapy, Moist heat, Taping, Manual therapy, and Re-evaluation   PLAN FOR NEXT SESSION: Review HEP and progress PRN, manual/dry needling for cervical paraspinals and upper trap region, mobs for cervical spine, progress DNF endurance and periscapular strengthening for postural control, incorporate pec stretching and thoracic mobility exercises   Kevan Ny Mozell Haber PT 01/23/2023, 11:29 AM

## 2023-01-29 ENCOUNTER — Ambulatory Visit: Payer: Medicare HMO | Attending: Orthopedic Surgery | Admitting: Physical Therapy

## 2023-01-29 ENCOUNTER — Other Ambulatory Visit: Payer: Self-pay

## 2023-01-29 ENCOUNTER — Encounter: Payer: Self-pay | Admitting: Physical Therapy

## 2023-01-29 DIAGNOSIS — M25511 Pain in right shoulder: Secondary | ICD-10-CM | POA: Diagnosis not present

## 2023-01-29 DIAGNOSIS — M542 Cervicalgia: Secondary | ICD-10-CM | POA: Insufficient documentation

## 2023-01-29 DIAGNOSIS — G8929 Other chronic pain: Secondary | ICD-10-CM | POA: Diagnosis not present

## 2023-01-29 DIAGNOSIS — M6281 Muscle weakness (generalized): Secondary | ICD-10-CM | POA: Insufficient documentation

## 2023-01-29 NOTE — Patient Instructions (Signed)
Access Code: [email or web address] URL: [email or web address] Date: 01/29/2023 Prepared by: Hilda Blades  Exercises - Supine Cervical Retraction with Towel  - 1-2 x daily - 2 sets - 10 reps - 5 seconds hold - Sidelying Thoracic Lumbar Rotation  - 1-2 x daily - 10 reps - Seated Assisted Cervical Rotation with Towel  - 1-2 x daily - 2 sets - 15 reps - Seated Cervical Sidebending Stretch  - 1-2 x daily - 3 reps - 20 seconds hold - Doorway Pec Stretch at 90 Degrees Abduction  - 1-2 x daily - 3 reps - 2 seconds hold - Standing Shoulder Horizontal Abduction with Resistance  - 1-2 x daily - 2 sets - 10 reps - Shoulder External Rotation and Scapular Retraction with Resistance  - 1-2 x daily - 2 sets - 15 reps

## 2023-01-29 NOTE — Therapy (Signed)
OUTPATIENT PHYSICAL THERAPY TREATMENT NOTE   Patient Name: Grant [name] MRN: [id number] DOB:[birth year], 52 y.o., male Today's Date: 01/29/2023  PCP: Donnajean Lopes, MD REFERRING PROVIDER: Geronimo Boot, PA-C     PT End of Session - 01/29/23 1312     Visit Number 4    Number of Visits 9    Date for PT Re-Evaluation 03/07/23    Authorization Type [employer] MCR    Authorization Time Period 01/16/2023 - 03/26/2023    Authorization - Visit Number 3    Authorization - Number of Visits 8    Progress Note Due on Visit 10    PT Start Time 1315    PT Stop Time 1400    PT Time Calculation (min) 45 min    Activity Tolerance Patient tolerated treatment well    Behavior During Therapy [facility] for tasks assessed/performed              Past Medical History:  Diagnosis Date   Alcohol abuse    Anxiety    Arteritic AION (anterior ischemic optic neuropathy)    Chronic low back pain 11/30/2015   Chronic pain    Depression    Diabetic peripheral neuropathy ([place]) 11/30/2015   Gastric ulcer    Hypertension    Lumbar radiculopathy    Neuropathy    Sleep apnea    Type 2 diabetes mellitus ([place])    Past Surgical History:  Procedure Laterality Date   AMPUTATION Right 12/07/2020   Procedure: RIGHT FOOT 5TH RAY AMPUTATION;  Surgeon: [name], MD;  Location: [place];  Service: Orthopedics;  Laterality: Right;   back lipoma resection  03/2006   BIOPSY  06/15/2018   Procedure: BIOPSY;  Surgeon: Jerene Bears, MD;  Location: [facility] ENDOSCOPY;  Service: Gastroenterology;;   CARDIAC CATHETERIZATION     ESOPHAGOGASTRODUODENOSCOPY (EGD) WITH PROPOFOL N/A 06/15/2018   Procedure: ESOPHAGOGASTRODUODENOSCOPY (EGD) WITH PROPOFOL;  Surgeon: Jerene Bears, MD;  Location: [facility] ENDOSCOPY;  Service: Gastroenterology;  Laterality: N/A;   KNEE ARTHROSCOPY Left 10/2008   Meniscus Repair   KNEE ARTHROSCOPY W/ ACL RECONSTRUCTION Left 1998   LEFT HEART CATHETERIZATION WITH CORONARY ANGIOGRAM N/A 08/11/2013    Procedure: LEFT HEART CATHETERIZATION WITH CORONARY ANGIOGRAM;  Surgeon: Pixie Casino, MD;  Location: [facility] CATH LAB;  Service: Cardiovascular;  Laterality: N/A;   LOWER EXTREMITY ANGIOGRAPHY Right 11/10/2020   Procedure: LOWER EXTREMITY ANGIOGRAPHY;  Surgeon: Algernon Huxley, MD;  Location: [place] CV LAB;  Service: Cardiovascular;  Laterality: Right;   MENISCUS REPAIR Left 2001   TOTAL KNEE ARTHROPLASTY Left    UPPER GI ENDOSCOPY  10/2007   showed gastritis   Patient Active Problem List   Diagnosis Date Noted   Asymmetric blood pressures 10/16/2022   PAD (peripheral artery disease) ([place]) 07/17/2022   Osteomyelitis of fifth toe of right foot (HCC)    Alcohol use disorder, severe, dependence ([place]) 04/16/2019   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[place]) 04/16/2019   Cannabis hyperemesis syndrome concurrent with and due to cannabis abuse ([place]) 08/01/2018   Gastroesophageal reflux disease 07/17/2018   Long QT interval 07/16/2018   Early satiety    Esophagitis, [place] grade D    Hypercalcemia 06/09/2018   SIRS (systemic inflammatory response syndrome) ([place]) 06/09/2018   Hematemesis 06/09/2018   Type 2 diabetes mellitus with peripheral neuropathy ([place]) 06/09/2018   Closed displaced fracture of first metatarsal bone of left foot 07/31/2017   Acidosis 09/17/2016   Dehydration  09/17/2016   Abdominal pain 09/17/2016   DKA (diabetic ketoacidoses) 09/17/2016   Nausea and vomiting 09/17/2016   IDDM (insulin dependent diabetes mellitus)    Gastroparesis    Diabetic peripheral neuropathy ([place]) 11/30/2015   Chronic low back pain 11/30/2015   Coronary artery spasm ([place]) 08/17/2013   Hyperlipidemia 08/17/2013   Peripheral neuropathy 08/05/2013   Chest pain 08/05/2013   Fatigue 08/05/2013   Tachycardia 08/05/2013   DOE (dyspnea on exertion) 08/05/2013   Diabetes mellitus, insulin dependent (IDDM), uncontrolled 02/09/2008   Anxiety state 02/09/2008    Essential hypertension 02/09/2008   ALCOHOL ABUSE, HX OF 02/09/2008   LIVER FUNCTION TESTS, ABNORMAL, HX OF 02/09/2008   NEOPLASM, BENIGN, ESOPHAGUS 09/16/2007   Gastritis 09/16/2007    THERAPY DIAG:  Cervicalgia  Chronic right shoulder pain  Muscle weakness (generalized)   Rationale for Evaluation and Treatment Rehabilitation  REFERRING DIAG: Cervical radiculopathy, Cervical spondylosis   PERTINENT HISTORY: DM with LE/UE peripheral neuropathy   PRECAUTIONS/RESTRICTIONS: No   SUBJECTIVE:  Pt reports that the dry needling has been giving him relief. He does report that when he does the chin tucks at the wall his right arm will go numb, not as bad as when he is lying down.   PAIN:  Are you having pain? Yes:  NPRS scale: 1/10 (8/10 at worst) Pain location: Neck Pain description: Tightness, sharp Aggravating factors: Turning neck, looking up Relieving factors: None   OBJECTIVE: (objective measures completed at initial evaluation unless otherwise dated) PATIENT SURVEYS:  FOTO 45% functional status   POSTURE:             Rounded shoulder and forward head posture   PALPATION: Tender to palpation bilateral cervical paraspinals, right upper trap        CERVICAL ROM:    Active ROM A/PROM (deg) eval   01/29/2023  Flexion 45   Extension 25   Right lateral flexion 10   Left lateral flexion 5   Right rotation 50   Left rotation 40 55   (Blank rows = not tested)   Patient reports neck tightness with all cervical motion   UPPER EXTREMITY ROM:   Active ROM Right eval Left eval  Shoulder flexion 130 150  Shoulder extension      Shoulder abduction      Shoulder adduction      Shoulder extension      Shoulder internal rotation      Shoulder external rotation      Elbow flexion      Elbow extension      Wrist flexion      Wrist extension      Wrist ulnar deviation      Wrist radial deviation      Wrist pronation      Wrist supination       (Blank rows =  not tested)   UPPER EXTREMITY MMT:   MMT Right eval Left eval  Shoulder flexion 4 5  Shoulder extension      Shoulder abduction      Shoulder adduction      Shoulder extension      Shoulder internal rotation      Shoulder external rotation 4 5  Middle trapezius 4- 4-  Lower trapezius 4- 4-  Elbow flexion      Elbow extension      Wrist flexion      Wrist extension      Wrist ulnar deviation      Wrist radial  deviation      Wrist pronation      Wrist supination      Grip strength       (Blank rows = not tested)   Myotomes grossly WFL   CERVICAL SPECIAL TESTS:  Radicular testing negative for reproduction of shoulder pain, he does report neck pain and tightness that is most likely facet and muscular related   FUNCTIONAL TESTS:  DNF endurance: 14 seconds   HOME EXERCISE PROGRAM: Access Code: [id number]    TODAY'S TREATMENT: [place] Adult PT Treatment:                                                DATE: 01/29/2023 Therapeutic Exercise: UBE L3 x 4 min (fwd/bwd) while taking subjective Sidelying thoracic rotation x 10 each Supine thoracic extension mobs over FR at various levels Cervical rotation SNAG x 10 each Seated upper trap stretch 2 x 20 sec each Doorway pec stretch 3 x 20 sec Seated horizontal abduction with green x 15 Manual Therapy: Skilled palpation and monitoring of muscle tensions while performing TPDN Cervical PA mobs, lateral mobs Suboccipital release with gentle manual traction Passive upper trap and levator scap stretch Trigger Point Dry Needling Treatment: Pre-treatment instruction: Patient instructed on dry needling rationale, procedures, and possible side effects including pain during treatment (achy,cramping feeling), bruising, drop of blood, lightheadedness, nausea, sweating. Patient Consent Given: Yes Education handout provided: Previously provided Muscles treated: bilateral upper trap and suboccipitals  Needle size and number: .30x63m x  7 Electrical stimulation performed: No Parameters: N/A Treatment response/outcome: Twitch response elicited and Palpable decrease in muscle tension Post-treatment instructions: Patient instructed to expect possible mild to moderate muscle soreness later today and/or tomorrow. Patient instructed in methods to reduce muscle soreness and to continue prescribed HEP. If patient was dry needled over the lung field, patient was instructed on signs and symptoms of pneumothorax and, however unlikely, to see immediate medical attention should they occur. Patient was also educated on signs and symptoms of infection and to seek medical attention should they occur. Patient verbalized understanding of these instructions and education.   TREATMENT 2/28: Therapeutic Exercise: - UBE 2.5'/2.5' fwd and backward for warm up while taking subjective - chin tuck - reviewing HEP including snags (L/R) Manual therapy: Chin tuck with OP Cervical distraction Lateral glides Cx spine Lateral glides throughout cervical spine Skilled palpation to identify trigger points for TDN STM to all listed muscles following TDN Trigger Point Dry-Needling  Treatment instructions: Expect mild to moderate muscle soreness. S/S of pneumothorax if dry needled over a lung field, and to seek immediate medical attention should they occur. Patient verbalized understanding of these instructions and education.  Patient Consent Given: Yes Education handout provided: No Muscles treated: bil UT, bil cervical paraspinals, bil sub occipitals Electrical stimulation performed: No Parameters: N/A Treatment response/outcome: twitch, pain reduction  TREATMENT 2/21: Therapeutic Exercise: - UBE 2.5'/2.5' fwd and backward for warm up while taking subjective - cervical snags - L/R - chin tuck - reviewing HEP Manual therapy: Chin tuck with OP Cervical distraction Lateral glides throughout cervical spine Skilled palpation to identify trigger  points for TDN STM to all listed muscles following TDN  Trigger Point Dry-Needling  Treatment instructions: Expect mild to moderate muscle soreness. S/S of pneumothorax if dry needled over a lung field, and to seek immediate medical attention should they  occur. Patient verbalized understanding of these instructions and education.   Patient Consent Given: Yes Education handout provided: No Muscles treated: bil UT, bil cervical paraspinals, bil sub occipitals Electrical stimulation performed: No Parameters: N/A Treatment response/outcome: twitch, pain reduction   ASSESSMENT: CLINICAL IMPRESSION: Patient tolerated therapy well with no adverse effects. Therapy continued with TPDN for upper trap and cervical region with good therapeutic benefit, and he does demonstrate improve left cervical rotation this visit. Progress with some thoracic mobility and cervical stretching this visit, and updated HEP to progress his mobility and postural exercises. Patient would benefit from continued skilled PT to progress his mobility and strength in order to reduce pain and maximize functional ability.     OBJECTIVE IMPAIRMENTS: decreased activity tolerance, decreased endurance, decreased ROM, decreased strength, hypomobility, postural dysfunction, and pain.    ACTIVITY LIMITATIONS: carrying, lifting, and sitting   PARTICIPATION LIMITATIONS: driving and occupation   PERSONAL FACTORS: Past/current experiences and Time since onset of injury/illness/exacerbation are also affecting patient's functional outcome.     GOALS: Goals reviewed with patient? Yes   SHORT TERM GOALS: Target date: 02/07/2023   Patient will be I with initial HEP in order to progress with therapy. Baseline: HEP provided at eval Goal status: INITIAL   2.  PT will review FOTO with patient by 3rd visit in order to understand expected progress and outcome with therapy. Baseline: FOTO assessed at eval Goal status: INITIAL   3.  Patient  will report neck pain </= 5/10 with turning neck while at work to reduce functional limitations Baseline: 8/10 pain Goal status: INITIAL   LONG TERM GOALS: Target date: 03/07/2023   Patient will be I with final HEP to maintain progress from PT. Baseline: HEP provided at eval Goal status: INITIAL   2.  Patient will report >/= 61% status on FOTO to indicate improved functional ability. Baseline: 45% functional status Goal status: INITIAL   3.  Patient will demonstrate bilateral cervical rotation >/= 60 deg in order to improve driving and ability to perform work tasks Baseline: cervical rotation right 50 deg, left 40 deg Goal status: INITIAL   4.  Patient will demonstrate periscapular strength >/= 4/5 MMT in order to improve postural control with sitting and walking Baseline: grossly 4-/5 MMT Goal status: INITIAL   5.  Patient will report neck pain </= 2/10 with turning neck while at work to reduce functional limitations Baseline: 8/10 pain Goal status: INITIAL     PLAN: PT FREQUENCY: 1x/week   PT DURATION: 8 weeks   PLANNED INTERVENTIONS: Therapeutic exercises, Therapeutic activity, Neuromuscular re-education, Balance training, Gait training, Patient/Family education, Self Care, Joint mobilization, Joint manipulation, Aquatic Therapy, Dry Needling, Electrical stimulation, Spinal manipulation, Spinal mobilization, Cryotherapy, Moist heat, Taping, Manual therapy, and Re-evaluation   PLAN FOR NEXT SESSION: Review HEP and progress PRN, manual/dry needling for cervical paraspinals and upper trap region, mobs for cervical spine, progress DNF endurance and periscapular strengthening for postural control, incorporate pec stretching and thoracic mobility exercises   Hilda Blades, PT, DPT, LAT, ATC 01/29/23  3:01 PM Phone: [phone] Fax: [phone]

## 2023-01-31 ENCOUNTER — Ambulatory Visit: Payer: Medicare HMO | Admitting: Sports Medicine

## 2023-01-31 DIAGNOSIS — J310 Chronic rhinitis: Secondary | ICD-10-CM | POA: Diagnosis not present

## 2023-01-31 DIAGNOSIS — H9313 Tinnitus, bilateral: Secondary | ICD-10-CM | POA: Diagnosis not present

## 2023-01-31 DIAGNOSIS — H903 Sensorineural hearing loss, bilateral: Secondary | ICD-10-CM | POA: Diagnosis not present

## 2023-02-07 ENCOUNTER — Ambulatory Visit (INDEPENDENT_AMBULATORY_CARE_PROVIDER_SITE_OTHER): Payer: Medicare HMO | Admitting: Sports Medicine

## 2023-02-07 ENCOUNTER — Ambulatory Visit: Payer: Self-pay

## 2023-02-07 ENCOUNTER — Ambulatory Visit
Payer: Medicare HMO | Attending: Student in an Organized Health Care Education/Training Program | Admitting: Student in an Organized Health Care Education/Training Program

## 2023-02-07 ENCOUNTER — Encounter: Payer: Self-pay | Admitting: Student in an Organized Health Care Education/Training Program

## 2023-02-07 ENCOUNTER — Encounter: Payer: Self-pay | Admitting: Sports Medicine

## 2023-02-07 VITALS — BP 146/89 | HR 71 | Temp 97.7°F | Resp 16 | Ht 71.5 in | Wt 220.0 lb

## 2023-02-07 DIAGNOSIS — M5030 Other cervical disc degeneration, unspecified cervical region: Secondary | ICD-10-CM | POA: Insufficient documentation

## 2023-02-07 DIAGNOSIS — M47812 Spondylosis without myelopathy or radiculopathy, cervical region: Secondary | ICD-10-CM | POA: Insufficient documentation

## 2023-02-07 DIAGNOSIS — M5412 Radiculopathy, cervical region: Secondary | ICD-10-CM | POA: Diagnosis not present

## 2023-02-07 DIAGNOSIS — G894 Chronic pain syndrome: Secondary | ICD-10-CM | POA: Diagnosis not present

## 2023-02-07 DIAGNOSIS — E1142 Type 2 diabetes mellitus with diabetic polyneuropathy: Secondary | ICD-10-CM | POA: Insufficient documentation

## 2023-02-07 DIAGNOSIS — E1140 Type 2 diabetes mellitus with diabetic neuropathy, unspecified: Secondary | ICD-10-CM | POA: Diagnosis not present

## 2023-02-07 DIAGNOSIS — M7712 Lateral epicondylitis, left elbow: Secondary | ICD-10-CM | POA: Diagnosis not present

## 2023-02-07 DIAGNOSIS — M25522 Pain in left elbow: Secondary | ICD-10-CM

## 2023-02-07 MED ORDER — BETAMETHASONE SOD PHOS & ACET 6 (3-3) MG/ML IJ SUSP
6.0000 mg | INTRAMUSCULAR | Status: AC | PRN
  Administered 2023-02-07: 6 mg via INTRA_ARTICULAR

## 2023-02-07 MED ORDER — LIDOCAINE HCL 1 % IJ SOLN
1.0000 mL | INTRAMUSCULAR | Status: AC | PRN
  Administered 2023-02-07: 1 mL

## 2023-02-07 MED ORDER — BUPIVACAINE HCL 0.25 % IJ SOLN
1.0000 mL | INTRAMUSCULAR | Status: AC | PRN
  Administered 2023-02-07: 1 mL

## 2023-02-07 NOTE — Progress Notes (Signed)
Safety precautions to be maintained throughout the outpatient stay will include: orient to surroundings, keep bed in low position, maintain call bell within reach at all times, provide assistance with transfer out of bed and ambulation.  

## 2023-02-07 NOTE — Patient Instructions (Addendum)
____________________________________________________________________________________________  General Risks and Possible Complications  Patient Responsibilities: It is important that you read this as it is part of your informed consent. It is our duty to inform you of the risks and possible complications associated with treatments offered to you. It is your responsibility as a patient to read this and to ask questions about anything that is not clear or that you believe was not covered in this document.  Patient's Rights: You have the right to refuse treatment. You also have the right to change your mind, even after initially having agreed to have the treatment done. However, under this last option, if you wait until the last second to change your mind, you may be charged for the materials used up to that point.  Introduction: Medicine is not an exact science. Everything in Medicine, including the lack of treatment(s), carries the potential for danger, harm, or loss (which is by definition: Risk). In Medicine, a complication is a secondary problem, condition, or disease that can aggravate an already existing one. All treatments carry the risk of possible complications. The fact that a side effects or complications occurs, does not imply that the treatment was conducted incorrectly. It must be clearly understood that these can happen even when everything is done following the highest safety standards.  No treatment: You can choose not to proceed with the proposed treatment alternative. The "PRO(s)" would include: avoiding the risk of complications associated with the therapy. The "CON(s)" would include: not getting any of the treatment benefits. These benefits fall under one of three categories: diagnostic; therapeutic; and/or palliative. Diagnostic benefits include: getting information which can ultimately lead to improvement of the disease or symptom(s). Therapeutic benefits are those associated with  the successful treatment of the disease. Finally, palliative benefits are those related to the decrease of the primary symptoms, without necessarily curing the condition (example: decreasing the pain from a flare-up of a chronic condition, such as incurable terminal cancer).  General Risks and Complications: These are associated to most interventional treatments. They can occur alone, or in combination. They fall under one of the following six (6) categories: no benefit or worsening of symptoms; bleeding; infection; nerve damage; allergic reactions; and/or death. No benefits or worsening of symptoms: In Medicine there are no guarantees, only probabilities. No healthcare provider can ever guarantee that a medical treatment will work, they can only state the probability that it may. Furthermore, there is always the possibility that the condition may worsen, either directly, or indirectly, as a consequence of the treatment. Bleeding: This is more common if the patient is taking a blood thinner, either prescription or over the counter (example: Goody Powders, Fish oil, Aspirin, Garlic, etc.), or if suffering a condition associated with impaired coagulation (example: Hemophilia, cirrhosis of the liver, low platelet counts, etc.). However, even if you do not have one on these, it can still happen. If you have any of these conditions, or take one of these drugs, make sure to notify your treating physician. Infection: This is more common in patients with a compromised immune system, either due to disease (example: diabetes, cancer, human immunodeficiency virus [HIV], etc.), or due to medications or treatments (example: therapies used to treat cancer and rheumatological diseases). However, even if you do not have one on these, it can still happen. If you have any of these conditions, or take one of these drugs, make sure to notify your treating physician. Nerve Damage: This is more common when the treatment is an    invasive one, but it can also happen with the use of medications, such as those used in the treatment of cancer. The damage can occur to small secondary nerves, or to large primary ones, such as those in the spinal cord and brain. This damage may be temporary or permanent and it may lead to impairments that can range from temporary numbness to permanent paralysis and/or brain death. Allergic Reactions: Any time a substance or material comes in contact with our body, there is the possibility of an allergic reaction. These can range from a mild skin rash (contact dermatitis) to a severe systemic reaction (anaphylactic reaction), which can result in death. Death: In general, any medical intervention can result in death, most of the time due to an unforeseen complication. ____________________________________________________________________________________________    ______________________________________________________________________  Preparing for your procedure  Appointments: If you think you may not be able to keep your appointment, call 24-48 hours in advance to cancel. We need time to make it available to others.  During your procedure appointment there will be: No Prescription Refills. No disability issues to discussed. No medication changes or discussions.  Instructions: Food intake: Avoid eating anything solid for at least 8 hours prior to your procedure. Clear liquid intake: You may take clear liquids such as water up to 2 hours prior to your procedure. (No carbonated drinks. No soda.) Transportation: Unless otherwise stated by your physician, bring a driver. Morning Medicines: Except for blood thinners, take all of your other morning medications with a sip of water. Make sure to take your heart and blood pressure medicines. If your blood pressure's lower number is above 100, the case will be rescheduled. Blood thinners: Make sure to stop your blood thinners as instructed.  If you take  a blood thinner, but were not instructed to stop it, call our office [phone] and ask to talk to a nurse. Not stopping a blood thinner prior to certain procedures could lead to serious complications. Diabetics on insulin: Notify the staff so that you can be scheduled 1st case in the morning. If your diabetes requires high dose insulin, take only  of your normal insulin dose the morning of the procedure and notify the staff that you have done so. Preventing infections: Shower with an antibacterial soap the morning of your procedure.  Build-up your immune system: Take 1000 mg of Vitamin C with every meal (3 times a day) the day prior to your procedure. Antibiotics: Inform the nursing staff if you are taking any antibiotics or if you have any conditions that may require antibiotics prior to procedures. (Example: recent joint implants)   Pregnancy: If you are pregnant make sure to notify the nursing staff. Not doing so may result in injury to the fetus, including death.  Sickness: If you have a cold, fever, or any active infections, call and cancel or reschedule your procedure. Receiving steroids while having an infection may result in complications. Arrival: You must be in the facility at least 30 minutes prior to your scheduled procedure. Tardiness: Your scheduled time is also the cutoff time. If you do not arrive at least 15 minutes prior to your procedure, you will be rescheduled.  Children: Do not bring any children with you. Make arrangements to keep them home. Dress appropriately: There is always a possibility that your clothing may get soiled. Avoid long dresses. Valuables: Do not bring any jewelry or valuables.  Reasons to call and reschedule or cancel your procedure: (Following these recommendations will minimize the risk   of a serious complication.) Surgeries: Avoid having procedures within 2 weeks of any surgery. (Avoid for 2 weeks before or after any surgery). Flu Shots: Avoid having  procedures within 2 weeks of a flu shots or . (Avoid for 2 weeks before or after immunizations). Barium: Avoid having a procedure within 7-10 days after having had a radiological study involving the use of radiological contrast. (Myelograms, Barium swallow or enema study). Heart attacks: Avoid any elective procedures or surgeries for the initial 6 months after a "Myocardial Infarction" (Heart Attack). Blood thinners: It is imperative that you stop these medications before procedures. Let us know if you if you take any blood thinner.  Infection: Avoid procedures during or within two weeks of an infection (including chest colds or gastrointestinal problems). Symptoms associated with infections include: Localized redness, fever, chills, night sweats or profuse sweating, burning sensation when voiding, cough, congestion, stuffiness, runny nose, sore throat, diarrhea, nausea, vomiting, cold or Flu symptoms, recent or current infections. It is specially important if the infection is over the area that we intend to treat. Heart and lung problems: Symptoms that may suggest an active cardiopulmonary problem include: cough, chest pain, breathing difficulties or shortness of breath, dizziness, ankle swelling, uncontrolled high or unusually low blood pressure, and/or palpitations. If you are experiencing any of these symptoms, cancel your procedure and contact your primary care physician for an evaluation.  Remember:  Regular Business hours are:  Monday to Thursday 8:00 AM to 4:00 PM  Provider's Schedule: Francisco Naveira, MD:  Procedure days: Tuesday and Thursday 7:30 AM to 4:00 PM  Bilal Lateef, MD:  Procedure days: Monday and Wednesday 7:30 AM to 4:00 PM  ______________________________________________________________________    

## 2023-02-07 NOTE — Progress Notes (Signed)
   Procedure Note  Patient: Grant [name]             Date of Birth: July 09, 1971           MRN: [id number]             Visit Date: 02/07/2023  [name] follow-up today for [place] of left elbow/lateral epicondyle and injection.  Imaging:   [place] Extrem Up Left Ltd Limited musculoskeletal ultrasound of the left upper extremity, left elbow  was performed today.  Evaluation of the radiocapitellar joint shows no  joint effusion, there is preserved cartilage in this location.  The  lateral epicondyle was seen in long and short axis without evidence of  tearing or avulsion.  There is a very small spur off the very proximal end  of the lateral epicondyle.  There is some mild insertional tendinosis of  the overlying common extensor tendon musculature without evidence of  high-grade tearing.    Procedures: Visit Diagnoses:  1. Lateral epicondylitis, left elbow   2. Pain in left elbow    Hand/UE Inj: L elbow for lateral epicondylitis on 02/07/2023 11:37 AM Indications: pain Details: 25 G needle, dorsal approach Medications: 1 mL lidocaine 1 %; 1 mL bupivacaine 0.25 %; 6 mg betamethasone acetate-betamethasone sodium phosphate 6 (3-3) MG/ML Outcome: tolerated well, no immediate complications  Procedure: Lateral Epicondylitis (Common Extensor Tendon) Injection, Left Elbow After informed verbal consent was obtained, a timeout was performed.  Patient was lying supine on exam table.  Area overlying the affected lateral epicondyle was prepped with chloraprep and alcohol swabs. Then utilizing ultrasound guidance via an in-plane approach, the patient's lateral epicondyle and common extensor tendon was injected with 1:1:1 lidocaine:bupivicaine:betamethasone with multiple needle fenestrations from a distal to proximal direction. Patient tolerated procedure well without immediate complications.  Procedure, treatment alternatives, risks and benefits explained, specific risks discussed. Consent was given by the  patient. Immediately prior to procedure a time out was called to verify the correct patient, procedure, equipment, support staff and site/side marked as required. Patient was prepped and draped in the usual sterile fashion.     [name] tolerated procedure well, we did discuss postprocedural care -May use ice or Tylenol for any postinjection pain -Starting after the weekend he may return to doing the home exercises for both elbows daily -Will follow-up with me as needed  [name], DO [place]  This note was dictated using Dragon naturally speaking software and may contain errors in syntax, spelling, or content which have not been identified prior to signing this note.

## 2023-02-07 NOTE — Progress Notes (Signed)
Patient: Grant [name]  Service Category: E/M  Provider: Gillis Santa, MD  DOB: 05/28/1971  DOS: 02/07/2023  Referring Provider: Jola Babinski  MRN: [id number]  Setting: Ambulatory outpatient  PCP: Donnajean Lopes, MD  Type: New Patient  Specialty: Interventional Pain Management    Location: Office  Delivery: Face-to-face     Primary Reason(s) for Visit: Encounter for initial evaluation of one or more chronic problems (new to examiner) potentially causing chronic pain, and posing a threat to normal musculoskeletal function. (Level of risk: High) CC: Neck Pain (Bilateral )  HPI  Grant [name] is a 52 y.o. year old, male patient, who comes for the first time to our practice referred by Geronimo Boot, PA-C for our initial evaluation of his chronic pain. He has NEOPLASM, BENIGN, ESOPHAGUS; Diabetes mellitus, insulin dependent (IDDM), uncontrolled; Anxiety state; Essential hypertension; Gastritis; ALCOHOL ABUSE, HX OF; LIVER FUNCTION TESTS, ABNORMAL, HX OF; Peripheral neuropathy; Chest pain; Fatigue; Tachycardia; DOE (dyspnea on exertion); Coronary artery spasm ([place]); Hyperlipidemia; Diabetic peripheral neuropathy ([place]); Chronic low back pain; Acidosis; Dehydration; Abdominal pain; DKA (diabetic ketoacidoses); Nausea and vomiting; IDDM (insulin dependent diabetes mellitus); Gastroparesis; Hypercalcemia; SIRS (systemic inflammatory response syndrome) ([place]); Hematemesis; Type 2 diabetes mellitus with peripheral neuropathy ([place]); Early satiety; Esophagitis, [place] grade D; Long QT interval; Gastroesophageal reflux disease; Cannabis hyperemesis syndrome concurrent with and due to cannabis abuse ([place]); Alcohol use disorder, severe, dependence ([place]); 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[place]); Closed displaced fracture of first metatarsal bone of left foot; Osteomyelitis of fifth toe of right foot ([place]); PAD (peripheral artery disease) ([place]); Asymmetric blood pressures;  Cervical radicular pain; Cervical facet joint syndrome; Degenerative disc disease, cervical; Chronic pain syndrome; and Chronic painful diabetic neuropathy (HCC) on their problem list. Today he comes in for evaluation of his Neck Pain (Bilateral )  Pain Assessment: Location: Right, Left Neck Radiating: down right arm Onset: More than a month ago Duration: Chronic pain Quality: Constant, Discomfort, Numbness, Tingling, Other (Comment) (weakness when in the gym of the right arm) Severity: 3 /10 (subjective, self-reported pain score)  Effect on ADL: some weakness noted in right arm and limited ROM Timing: Constant Modifying factors: physical therapy just started and he reports improvement BP: (!) 146/89  HR: 71  Onset and Duration: Gradual and Present longer than 3 months Cause of pain: Unknown Severity: Getting better, NAS-11 at its worse: 8/10, NAS-11 at its best: 1/10, NAS-11 now: 3/10, and NAS-11 on the average: 5/10 Timing: During activity or exercise Aggravating Factors: Twisting Alleviating Factors: Acupuncture and Resting Associated Problems: Numbness, Tingling, and Weakness Quality of Pain: Burning, Constant, Sharp, and Tingling Previous Examinations or Tests: CT scan Previous Treatments: Physical Therapy and Stretching exercises  Mr. Alvizo is being evaluated for possible interventional pain management therapies for the treatment of his chronic pain.   Grant [name] is a pleasant 52 year old male who presents with a chief complaint of neck pain with radiation into his right arm and occasional radiation into his right hand.  He endorses numbness and tingling in his right arm.  He states that lateral movements of his neck do increase his pain.  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  He states that when he AB duct his right arm, his pain does get worse.  He is  currently participating in physical therapy which she does find helpful.  He has a history of alcohol abuse, type 2 diabetes with diabetic neuropathy along with peripheral arterial disease and subclavian arterial stenosis.  He has an insulin pump in place.  He also endorses numbness and tingling of bilateral feet.  He is on Lyrica 600 mg total dose daily.  He states that while the Lyrica is helpful, he has noticed memory impairment with it.  He had an injection today in his left elbow for lateral epicondylitis.  He is being referred for possible cervical injections  He would like to avoid opioid analgesics. Historic Controlled Substance Pharmacotherapy Review   Historical Monitoring: The patient  reports no history of drug use. List of prior UDS Testing: Lab Results  Component Value Date   COCAINSCRNUR NONE DETECTED 08/01/2018   COCAINSCRNUR NONE DETECTED 06/09/2018   COCAINSCRNUR NONE DETECTED 09/06/2008   THCU POSITIVE (A) 08/01/2018   THCU POSITIVE (A) 06/09/2018   THCU NONE DETECTED 09/06/2008   ETH  09/06/2008    <5        LOWEST DETECTABLE LIMIT FOR SERUM ALCOHOL IS 11 mg/dL FOR MEDICAL PURPOSES ONLY   Historical Background Evaluation: [place] PMP: PDMP reviewed during this encounter. Review of the past 37-month conducted.             [place] Department of public safety, offender search: (Editor, commissioningInformation) Non-contributory Risk Assessment Profile: Aberrant behavior: None observed or detected today Risk factors for fatal opioid overdose: None identified today Fatal overdose hazard ratio (HR): Calculation deferred Non-fatal overdose hazard ratio (HR): Calculation deferred Risk of opioid abuse or dependence: 0.7-3.0% with doses ? 36 MME/day and 6.1-26% with doses ? 120 MME/day. Substance use disorder (SUD) risk level: See below Personal History of Substance Abuse (SUD-Substance use disorder):  Alcohol: Positive Male or Male  Illegal Drugs: Negative  Rx Drugs: Negative  ORT  Risk Level calculation: High Risk  Opioid Risk Tool - 02/07/23 1251       Family History of Substance Abuse   Alcohol Positive Male    Illegal Drugs Positive Male    Rx Drugs Negative      Personal History of Substance Abuse   Alcohol Positive Male or Male    Illegal Drugs Negative    Rx Drugs Negative      Age   Age between 156-45years  No      Psychological Disease   Psychological Disease Positive    ADD Negative    OCD Negative    Bipolar Negative    Schizophrenia Negative    Depression Positive      Total Score   Opioid Risk Tool Scoring 12    Opioid Risk Interpretation High Risk            ORT Scoring interpretation table:  Score <3 = Low Risk for SUD  Score between 4-7 = Moderate Risk for SUD  Score >8 = High Risk for Opioid Abuse   PHQ-2 Depression Scale:  Total score:    PHQ-2 Scoring interpretation table: (Score and probability of major depressive disorder)  Score 0 = No depression  Score 1 = 15.4% Probability  Score 2 = 21.1% Probability  Score 3 = 38.4% Probability  Score 4 = 45.5% Probability  Score 5 = 56.4% Probability  Score 6 = 78.6% Probability   PHQ-9 Depression Scale:  Total score:    PHQ-9 Scoring interpretation table:  Score 0-4 = No depression  Score 5-9 = Mild depression  Score 10-14 = Moderate depression  Score 15-19 =  Moderately severe depression  Score 20-27 = Severe depression (2.4 times higher risk of SUD and 2.89 times higher risk of overuse)   Pharmacologic Plan: No opioid analgesics.            Initial impression: The patient indicated having no interest, at this time.  Meds   Current Outpatient Medications:    aspirin EC 81 MG tablet, Take 1 tablet PO daily, Disp: 150 tablet, Rfl: 2   atorvastatin (LIPITOR) 10 MG tablet, , Disp: , Rfl:    diazepam (VALIUM) 5 MG tablet, 1 po 30 minutes prior to MRI scan. May repeat x 1 right before MRI scan if needed. You will need a driver. (Patient not taking: Reported on  01/10/2023), Disp: 2 tablet, Rfl: 0   insulin aspart (NOVOLOG) 100 UNIT/ML injection, Insulin pump, Disp: , Rfl:    Insulin Human (INSULIN PUMP) SOLN, Inject into the skin as directed., Disp: , Rfl:    losartan-hydrochlorothiazide (HYZAAR) 100-12.5 MG tablet, Take 1 tablet by mouth at bedtime., Disp: , Rfl:    metoprolol succinate (TOPROL-XL) 50 MG 24 hr tablet, Take 50 mg by mouth at bedtime., Disp: , Rfl:    pregabalin (LYRICA) 200 MG capsule, Take 200 mg by mouth 3 (three) times daily., Disp: , Rfl:    traZODone (DESYREL) 100 MG tablet, Take 100 mg by mouth at bedtime., Disp: , Rfl:    zolpidem (AMBIEN) 10 MG tablet, Take 10 mg by mouth at bedtime., Disp: , Rfl:   Imaging Review  Cervical Imaging: Cervical MR wo contrast: Results for orders placed during the hospital encounter of 12/24/22  MR CERVICAL SPINE WO CONTRAST  Narrative CLINICAL DATA:  Neck pain when turning head.  EXAM: MRI CERVICAL SPINE WITHOUT CONTRAST  TECHNIQUE: Multiplanar, multisequence MR imaging of the cervical spine was performed. No intravenous contrast was administered.  COMPARISON:  None Available.  FINDINGS: Alignment: Slight C7-T1 anterolisthesis.  Vertebrae: No fracture, evidence of discitis, or bone lesion. Mild marrow edema at the left C7-T1 and right C6-7 facets.  Cord: Normal signal and morphology.  Posterior Fossa, vertebral arteries, paraspinal tissues: No perispinal mass or inflammation. Partially covered left mastoid opacification. Negative nasopharynx where covered on sagittal imaging.  Disc levels:  C2-3: Mild facet and uncovertebral spurring. Patent canal and foramina  C3-4: Moderate facet spurring. Mild uncovertebral ridging. Mild to moderate bilateral foraminal narrowing  C4-5: Uncovertebral and facet spurring eccentric to the right where there is moderate foraminal stenosis.  C5-6: Mild disc space narrowing and bulging with uncovertebral ridging. Mild bilateral facet  spurring. Moderate left more than right foraminal stenosis  C6-7: Degenerative facet spurring with circumferential disc bulging. Mild spinal stenosis. Bilateral foraminal stenosis which is advanced on the right and more moderate appearing on the left.  C7-T1:Degenerative facet spurring with mild anterolisthesis. Mild disc height loss. No neural impingement.  IMPRESSION: 1. Generalized cervical spine degeneration as described. Active facet arthritis on the right at C6-7 and left at C7-T1. Mild C7-T1 anterolisthesis. 2. Foraminal stenosis on the right at C4-5 to C6-7. 3. Left foraminal stenosis at C5-6 and C6-7. 4. Partially covered left mastoid opacification.   Electronically Signed By: Jorje Guild M.D. On: 12/25/2022 06:48  DG Shoulder Right  Narrative FINDINGS CLINICAL DATA:  TWISTED ANKLE - LATERAL PAIN.  FELL - RIGHT SHOULDER PAIN. LEFT ANKLE: THREE VIEWS SHOW LATERAL SOFT TISSUE SWELLING.   NO FRACTURE OR DISLOCATION. IMPRESSION LATERAL SOFT TISSUE SWELLING - NO FRACTURE. RIGHT SHOULDER, (THREE VIEWS): THERE IS NO EVIDENCE OF  FRACTURE OR DISLOCATION. NO OTHER SIGNIFICANT BONE OR SOFT TISSUE ABNORMALITIES ARE IDENTIFIED. IMPRESSION: NORMAL STUDY.  MR Lumbar Spine Wo Contrast  Narrative Clinical Data: Low back pain with bilateral hip pain.  MRI LUMBAR SPINE WITHOUT CONTRAST  Technique:  Multiplanar and multiecho pulse sequences of the lumbar spine were obtained without intravenous contrast.  Comparison: None.  Findings: Normal lumbar alignment.  Negative for fracture or mass. The conus medullaris is normal.  L1-2:  Negative  L2-3:  Normal disc.  Mild facet degeneration without stenosis.  L3-4:  Normal disc space.  Mild facet degeneration without stenosis.  L4-5:  Mild disc space narrowing.  Small central disc protrusion without significant spinal stenosis.  Mild facet degeneration bilaterally.  Neural foramina are patent bilaterally.  L5-S1:  Tiny  central disc protrusion without neural impingement or stenosis.  IMPRESSION: Mild lumbar degenerative changes as above.  Small central disc protrusions at L4-5 and L5-S1 without neural impingement.  Provider: Reesa Chew  DG Ankle Complete Left  Narrative FINDINGS CLINICAL DATA:  TWISTED ANKLE - LATERAL PAIN.  FELL - RIGHT SHOULDER PAIN. LEFT ANKLE: THREE VIEWS SHOW LATERAL SOFT TISSUE SWELLING.   NO FRACTURE OR DISLOCATION. IMPRESSION LATERAL SOFT TISSUE SWELLING - NO FRACTURE. RIGHT SHOULDER, (THREE VIEWS): THERE IS NO EVIDENCE OF FRACTURE OR DISLOCATION. NO OTHER SIGNIFICANT BONE OR SOFT TISSUE ABNORMALITIES ARE IDENTIFIED. IMPRESSION: NORMAL STUDY.  Complexity Note: Imaging results reviewed.                         ROS  Cardiovascular: No reported cardiovascular signs or symptoms such as High blood pressure, coronary artery disease, abnormal heart rate or rhythm, heart attack, blood thinner therapy or heart weakness and/or failure Pulmonary or Respiratory: No reported pulmonary signs or symptoms such as wheezing and difficulty taking a deep full breath (Asthma), difficulty blowing air out (Emphysema), coughing up mucus (Bronchitis), persistent dry cough, or temporary stoppage of breathing during sleep Neurological: No reported neurological signs or symptoms such as seizures, abnormal skin sensations, urinary and/or fecal incontinence, being born with an abnormal open spine and/or a tethered spinal cord Psychological-Psychiatric: Depressed and Difficulty sleeping and or falling asleep Gastrointestinal: No reported gastrointestinal signs or symptoms such as vomiting or evacuating blood, reflux, heartburn, alternating episodes of diarrhea and constipation, inflamed or scarred liver, or pancreas or irrregular and/or infrequent bowel movements Genitourinary: No reported renal or genitourinary signs or symptoms such as difficulty voiding or producing urine, peeing blood,  non-functioning kidney, kidney stones, difficulty emptying the bladder, difficulty controlling the flow of urine, or chronic kidney disease Hematological: No reported hematological signs or symptoms such as prolonged bleeding, low or poor functioning platelets, bruising or bleeding easily, hereditary bleeding problems, low energy levels due to low hemoglobin or being anemic Endocrine: High blood sugar requiring insulin (IDDM) Rheumatologic: No reported rheumatological signs and symptoms such as fatigue, joint pain, tenderness, swelling, redness, heat, stiffness, decreased range of motion, with or without associated rash Musculoskeletal: Negative for myasthenia gravis, muscular dystrophy, multiple sclerosis or malignant hyperthermia Work History: Working part time and Legally disabled  Allergies  Mr. Welle is allergic to codeine, cymbalta [duloxetine hcl], duloxetine, glucophage [metformin], lexapro [escitalopram], metformin hcl, ozempic (0.25 or 0.5 mg-dose) [semaglutide(0.25 or 0.'5mg'$ -dos)], and tylox [oxycodone-acetaminophen].  Laboratory Chemistry Profile   Renal Lab Results  Component Value Date   BUN 8 12/07/2020   CREATININE 0.69 12/07/2020   GFRAA >60 08/01/2018   GFRNONAA >60 12/07/2020   PROTEINUR NEGATIVE 08/01/2018  Electrolytes Lab Results  Component Value Date   NA 135 12/07/2020   K 3.6 12/07/2020   CL 100 12/07/2020   CALCIUM 9.3 12/07/2020   MG 2.0 07/30/2018   PHOS 3.4 07/28/2018     Hepatic Lab Results  Component Value Date   AST 34 08/01/2018   ALT 22 08/01/2018   ALBUMIN 4.5 08/01/2018   ALKPHOS 69 08/01/2018   LIPASE 48 08/01/2018     ID Lab Results  Component Value Date   HIV Non Reactive 06/09/2018   [place] NEGATIVE 12/05/2020   MRSAPCR NEGATIVE 07/24/2018   HCVAB <0.1 06/10/2018     Bone No results found for: "VD25OH", "VD125OH2TOT", "[id number]", "[id number]", "25OHVITD1", "25OHVITD2", "[id number]", "TESTOFREE", "TESTOSTERONE"    Endocrine Lab Results  Component Value Date   GLUCOSE 195 (H) 12/07/2020   GLUCOSEU >=500 (A) 08/01/2018   HGBA1C 9.4 (H) 06/09/2018   TSH 1.858 09/17/2016     Neuropathy Lab Results  Component Value Date   VITAMINB12 751 11/30/2015   HGBA1C 9.4 (H) 06/09/2018   HIV Non Reactive 06/09/2018     CNS No results found for: "COLORCSF", "APPEARCSF", "RBCCOUNTCSF", "WBCCSF", "POLYSCSF", "LYMPHSCSF", "EOSCSF", "PROTEINCSF", "GLUCCSF", "JCVIRUS", "CSFOLI", "IGGCSF", "LABACHR", "ACETBL"   Inflammation (CRP: Acute  ESR: Chronic) Lab Results  Component Value Date   ESRSEDRATE 2 11/30/2015   LATICACIDVEN 0.9 06/09/2018     Rheumatology Lab Results  Component Value Date   RF <10.0 11/30/2015     Coagulation Lab Results  Component Value Date   INR 1.20 06/11/2018   LABPROT 15.1 06/11/2018   APTT 28 06/09/2018   PLT 254 12/07/2020     Cardiovascular Lab Results  Component Value Date   TROPONINI 0.13 (HH) 06/10/2018   HGB 14.5 12/07/2020   HCT 42.7 12/07/2020     Screening Lab Results  Component Value Date   SARSCOV2NAA NEGATIVE 12/05/2020   MRSAPCR NEGATIVE 07/24/2018   HCVAB <0.1 06/10/2018   HIV Non Reactive 06/09/2018     Cancer No results found for: "CEA", "CA125", "LABCA2"   Allergens No results found for: "ALMOND", "APPLE", "ASPARAGUS", "AVOCADO", "BANANA", "BARLEY", "BASIL", "BAYLEAF", "GREENBEAN", "LIMABEAN", "WHITEBEAN", "BEEFIGE", "REDBEET", "BLUEBERRY", "BROCCOLI", "CABBAGE", "MELON", "CARROT", "CASEIN", "CASHEWNUT", "CAULIFLOWER", "CELERY"     Note: Lab results reviewed.  [place]  Drug: Mr. Charon  reports no history of drug use. Alcohol:  reports no history of alcohol use. Tobacco:  reports that he quit smoking about 6 years ago. His smoking use included cigarettes. He has a 10.00 pack-year smoking history. His smokeless tobacco use includes chew. Medical:  has a past medical history of Alcohol abuse, Anxiety, Arteritic AION (anterior ischemic optic  neuropathy), Chronic low back pain (11/30/2015), Chronic pain, Depression, Diabetic peripheral neuropathy ([place]) (11/30/2015), Gastric ulcer, Hypertension, Lumbar radiculopathy, Neuropathy, Sleep apnea, and Type 2 diabetes mellitus ([place]). Family: family history includes Alcohol abuse in his brother; Brain cancer in his maternal grandfather; Diabetes in his mother; Esophageal cancer in his father; Hyperlipidemia in his father; Hypertension in his father and mother; Stomach cancer in his paternal grandfather; Throat cancer in his father.  Past Surgical History:  Procedure Laterality Date   AMPUTATION Right 12/07/2020   Procedure: RIGHT FOOT 5TH RAY AMPUTATION;  Surgeon: [name], MD;  Location: [facility];  Service: Orthopedics;  Laterality: Right;   back lipoma resection  03/2006   BIOPSY  06/15/2018   Procedure: BIOPSY;  Surgeon: Jerene Bears, MD;  Location: [facility];  Service: Gastroenterology;;   CARDIAC CATHETERIZATION  ESOPHAGOGASTRODUODENOSCOPY (EGD) WITH PROPOFOL N/A 06/15/2018   Procedure: ESOPHAGOGASTRODUODENOSCOPY (EGD) WITH PROPOFOL;  Surgeon: Jerene Bears, MD;  Location: [facility];  Service: Gastroenterology;  Laterality: N/A;   KNEE ARTHROSCOPY Left 10/2008   Meniscus Repair   KNEE ARTHROSCOPY W/ ACL RECONSTRUCTION Left 1998   LEFT HEART CATHETERIZATION WITH CORONARY ANGIOGRAM N/A 08/11/2013   Procedure: LEFT HEART CATHETERIZATION WITH CORONARY ANGIOGRAM;  Surgeon: Pixie Casino, MD;  Location[facility] CATH LAB;  Service: Cardiovascular;  Laterality: N/A;   LOWER EXTREMITY ANGIOGRAPHY Right 11/10/2020   Procedure: LOWER EXTREMITY ANGIOGRAPHY;  Surgeon: Algernon Huxley, MD;  Location: [facility] CV LAB;  Service: Cardiovascular;  Laterality: Right;   MENISCUS REPAIR Left 2001   TOTAL KNEE ARTHROPLASTY Left    UPPER GI ENDOSCOPY  10/2007   showed gastritis   Active Ambulatory Problems    Diagnosis Date Noted   NEOPLASM, BENIGN, ESOPHAGUS 09/16/2007   Diabetes mellitus,  insulin dependent (IDDM), uncontrolled 02/09/2008   Anxiety state 02/09/2008   Essential hypertension 02/09/2008   Gastritis 09/16/2007   ALCOHOL ABUSE, HX OF 02/09/2008   LIVER FUNCTION TESTS, ABNORMAL, HX OF 02/09/2008   Peripheral neuropathy 08/05/2013   Chest pain 08/05/2013   Fatigue 08/05/2013   Tachycardia 08/05/2013   DOE (dyspnea on exertion) 08/05/2013   Coronary artery spasm ([place]) 08/17/2013   Hyperlipidemia 08/17/2013   Diabetic peripheral neuropathy ([place]) 11/30/2015   Chronic low back pain 11/30/2015   Acidosis 09/17/2016   Dehydration 09/17/2016   Abdominal pain 09/17/2016   DKA (diabetic ketoacidoses) 09/17/2016   Nausea and vomiting 09/17/2016   IDDM (insulin dependent diabetes mellitus)    Gastroparesis    Hypercalcemia 06/09/2018   SIRS (systemic inflammatory response syndrome) ([place]) 06/09/2018   Hematemesis 06/09/2018   Type 2 diabetes mellitus with peripheral neuropathy ([place]) 06/09/2018   Early satiety    Esophagitis, [place] grade D    Long QT interval 07/16/2018   Gastroesophageal reflux disease 07/17/2018   Cannabis hyperemesis syndrome concurrent with and due to cannabis abuse ([place]) 08/01/2018   Alcohol use disorder, severe, dependence ([place]) 04/16/2019   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[place]) 04/16/2019   Closed displaced fracture of first metatarsal bone of left foot 07/31/2017   Osteomyelitis of fifth toe of right foot (HCC)    PAD (peripheral artery disease) ([place]) 07/17/2022   Asymmetric blood pressures 10/16/2022   Cervical radicular pain 02/07/2023   Cervical facet joint syndrome 02/07/2023   Degenerative disc disease, cervical 02/07/2023   Chronic pain syndrome 02/07/2023   Chronic painful diabetic neuropathy ([place]) 02/07/2023   Resolved Ambulatory Problems    Diagnosis Date Noted   No Resolved Ambulatory Problems   Past Medical History:  Diagnosis Date   Alcohol abuse    Anxiety    Arteritic  AION (anterior ischemic optic neuropathy)    Chronic pain    Depression    Gastric ulcer    Hypertension    Lumbar radiculopathy    Neuropathy    Sleep apnea    Type 2 diabetes mellitus ([place])    Constitutional Exam  General appearance: Well nourished, well developed, and well hydrated. In no apparent acute distress Vitals:   02/07/23 1247  BP: (!) 146/89  Pulse: 71  Resp: 16  Temp: 97.7 F (36.5 C)  TempSrc: Temporal  SpO2: 98%  Weight: 220 lb (99.8 kg)  Height: 5' 11.5" (1.816 m)   BMI Assessment: Estimated body mass index is 30.26 kg/m as calculated from  the following:   Height as of this encounter: 5' 11.5" (1.816 m).   Weight as of this encounter: 220 lb (99.8 kg).  BMI interpretation table: BMI level Category Range association with higher incidence of chronic pain  <18 kg/m2 Underweight   18.5-24.9 kg/m2 Ideal body weight   25-29.9 kg/m2 Overweight Increased incidence by 20%  30-34.9 kg/m2 Obese (Class I) Increased incidence by 68%  35-39.9 kg/m2 Severe obesity (Class II) Increased incidence by 136%  >40 kg/m2 Extreme obesity (Class III) Increased incidence by 254%   Patient's current BMI Ideal Body weight  Body mass index is 30.26 kg/m. Ideal body weight: 76.5 kg (168 lb 8.7 oz) Adjusted ideal body weight: 85.8 kg (189 lb 2 oz)   BMI Readings from Last 4 Encounters:  02/07/23 30.26 kg/m  12/27/22 31.05 kg/m  12/07/22 31.49 kg/m  11/09/22 32.02 kg/m   Wt Readings from Last 4 Encounters:  02/07/23 220 lb (99.8 kg)  12/27/22 225 lb 12.8 oz (102.4 kg)  12/07/22 229 lb (103.9 kg)  11/09/22 232 lb 12.8 oz (105.6 kg)    Psych/Mental status: Alert, oriented x 3 (person, place, & time)       Eyes: PERLA Respiratory: No evidence of acute respiratory distress  Cervical Spine Area Exam  Skin & Axial Inspection: No masses, redness, edema, swelling, or associated skin lesions Alignment: Symmetrical Functional ROM: Pain restricted ROM, bilaterally right  greater than left Stability: No instability detected Muscle Tone/Strength: Functionally intact. No obvious neuro-muscular anomalies detected. Sensory (Neurological): Dermatomal pain pattern Palpation: No palpable anomalies             Positive Spurling's on the right Upper Extremity (UE) Exam    Side: Right upper extremity  Side: Left upper extremity  Skin & Extremity Inspection: Skin color, temperature, and hair growth are WNL. No peripheral edema or cyanosis. No masses, redness, swelling, asymmetry, or associated skin lesions. No contractures.  Skin & Extremity Inspection: Skin color, temperature, and hair growth are WNL. No peripheral edema or cyanosis. No masses, redness, swelling, asymmetry, or associated skin lesions. No contractures.  Functional ROM: Pain restricted ROM for shoulder and elbow  Functional ROM: Unrestricted ROM          Muscle Tone/Strength: Functionally intact. No obvious neuro-muscular anomalies detected.  Muscle Tone/Strength: Functionally intact. No obvious neuro-muscular anomalies detected.  Sensory (Neurological): Dermatomal pain pattern          Sensory (Neurological): Unimpaired          Palpation: No palpable anomalies              Palpation: No palpable anomalies              Provocative Test(s):  Phalen's test: deferred Tinel's test: deferred Apley's scratch test (touch opposite shoulder):  Action 1 (Across chest): Decreased ROM Action 2 (Overhead): Decreased ROM Action 3 (LB reach): Decreased ROM   Provocative Test(s):  Phalen's test: deferred Tinel's test: deferred Apley's scratch test (touch opposite shoulder):  Action 1 (Across chest): deferred Action 2 (Overhead): deferred Action 3 (LB reach): deferred   5 out of 5 strength bilateral upper extremity: Shoulder abduction, elbow flexion, elbow extension, thumb extension.  Paresthesias of bilateral feet  Assessment  Primary Diagnosis & Pertinent Problem List: The primary encounter diagnosis was  Cervical radicular pain. Diagnoses of Cervical facet joint syndrome, Degenerative disc disease, cervical, Type 2 diabetes mellitus with peripheral neuropathy (HCC), Chronic painful diabetic neuropathy ([place]), and Chronic pain syndrome were also pertinent to this  visit.  Visit Diagnosis (New problems to examiner): 1. Cervical radicular pain   2. Cervical facet joint syndrome   3. Degenerative disc disease, cervical   4. Type 2 diabetes mellitus with peripheral neuropathy (HCC)   5. Chronic painful diabetic neuropathy ([place])   6. Chronic pain syndrome    Plan of Care   I reviewed Keith's cervical MRI with him.  He has cervical foraminal stenosis on the right at C4-C5 to C6-C7 which is likely contributing to his cervical radicular pain and associated symptoms.  He also has facet arthritis on the right at C6-C7.  I discussed a cervical epidural steroid injection with him under fluoroscopy.  Patient states that physical therapy is currently helping him and that he would like to continue with physical therapy for a couple of additional weeks before considering a cervical epidural steroid injection.  This is very reasonable and I encouraged him to try doing the exercises at home as well.  He states that he goes to [facility] to workout which I commended him on.  We also discussed Qutenza treatment of bilateral feet for painful diabetic neuropathy.  Risk and benefits of this were discussed and patient would like to proceed.  I also encouraged him to consider alpha lipoic acid as he is on high-dose Lyrica with side effects of memory impairment.  Procedure Orders         Cervical Epidural Injection         NEUROLYSIS     1. Cervical radicular pain - Cervical Epidural Injection; Standing  2. Cervical facet joint syndrome  3. Degenerative disc disease, cervical  4. Type 2 diabetes mellitus with peripheral neuropathy (HCC)  5. Chronic painful diabetic neuropathy (HCC) - NEUROLYSIS; Future  6. Chronic pain  syndrome - Cervical Epidural Injection; Standing - NEUROLYSIS; Future  Future considerations   Interventional options: Diagnostic cervical facet medial branch nerve blocks Medication management options: Non- opioid, TCA trial, duloxetine  Provider-requested follow-up: Return in about 2 weeks (around 02/21/2023) for Qutenza.  Future Appointments  Date Time Provider [place]  02/14/2023 11:45 AM Bobette Mo, PT [facility]  02/15/2023 10:30 AM Geronimo Boot, PA-C [facility] None  02/18/2023  1:15 PM Bobette Mo, PT [facility]  03/06/2023  2:30 PM Marzetta Board, DPM [facility]  07/16/2023  8:00 AM [facility] VASC 3 [facility] None  07/16/2023  8:45 AM [facility] VASC 3 [facility] None  07/16/2023  9:15 AM Dew, Erskine Squibb, MD [facility] None    Duration of encounter: 70mnutes.  Total time on encounter, as per AMA guidelines included both the face-to-face and non-face-to-face time personally spent by the physician and/or other qualified health care professional(s) on the day of the encounter (includes time in activities that require the physician or other qualified health care professional and does not include time in activities normally performed by clinical staff). Physician's time may include the following activities when performed: Preparing to see the patient (e.g., pre-charting review of records, searching for previously ordered imaging, lab work, and nerve conduction tests) Review of prior analgesic pharmacotherapies. Reviewing PMP Interpreting ordered tests (e.g., lab work, imaging, nerve conduction tests) Performing post-procedure evaluations, including interpretation of diagnostic procedures Obtaining and/or reviewing separately obtained history Performing a medically appropriate examination and/or evaluation Counseling and educating the patient/family/caregiver Ordering medications, tests, or procedures Referring and communicating with other health care  professionals (when not separately reported) Documenting clinical information in the electronic or other health record Independently interpreting results (not separately reported) and  communicating results to the patient/ family/caregiver Care coordination (not separately reported)  Note by: Gillis Santa, MD (TTS technology used. I apologize for any typographical errors that were not detected and corrected.) Date: 02/07/2023; Time: 2:23 PM

## 2023-02-11 DIAGNOSIS — E1165 Type 2 diabetes mellitus with hyperglycemia: Secondary | ICD-10-CM | POA: Diagnosis not present

## 2023-02-11 DIAGNOSIS — E7800 Pure hypercholesterolemia, unspecified: Secondary | ICD-10-CM | POA: Diagnosis not present

## 2023-02-14 ENCOUNTER — Ambulatory Visit: Payer: Medicare HMO | Admitting: Physical Therapy

## 2023-02-14 ENCOUNTER — Encounter: Payer: Self-pay | Admitting: Physical Therapy

## 2023-02-14 ENCOUNTER — Other Ambulatory Visit: Payer: Self-pay

## 2023-02-14 DIAGNOSIS — G8929 Other chronic pain: Secondary | ICD-10-CM

## 2023-02-14 DIAGNOSIS — M6281 Muscle weakness (generalized): Secondary | ICD-10-CM

## 2023-02-14 DIAGNOSIS — M542 Cervicalgia: Secondary | ICD-10-CM

## 2023-02-14 DIAGNOSIS — M25511 Pain in right shoulder: Secondary | ICD-10-CM | POA: Diagnosis not present

## 2023-02-14 NOTE — Therapy (Signed)
OUTPATIENT PHYSICAL THERAPY TREATMENT NOTE   Patient Name: Grant [name] MRN: [id number] DOB:05/05/1971, 52 y.o., male Today's Date: 02/14/2023  PCP: Donnajean Lopes, MD REFERRING PROVIDER: Geronimo Boot, PA-C     PT End of Session - 02/14/23 1152     Visit Number 5    Number of Visits 9    Date for PT Re-Evaluation 03/07/23    Authorization Type [employer] MCR    Authorization Time Period 01/16/2023 - 03/26/2023    Authorization - Visit Number 4    Authorization - Number of Visits 8    Progress Note Due on Visit 10    PT Start Time [id number]    PT Stop Time 1230    PT Time Calculation (min) 45 min    Activity Tolerance Patient tolerated treatment well    Behavior During Therapy [facility] for tasks assessed/performed               Past Medical History:  Diagnosis Date   Alcohol abuse    Anxiety    Arteritic AION (anterior ischemic optic neuropathy)    Chronic low back pain 11/30/2015   Chronic pain    Depression    Diabetic peripheral neuropathy ([place]) 11/30/2015   Gastric ulcer    Hypertension    Lumbar radiculopathy    Neuropathy    Sleep apnea    Type 2 diabetes mellitus ([place])    Past Surgical History:  Procedure Laterality Date   AMPUTATION Right 12/07/2020   Procedure: RIGHT FOOT 5TH RAY AMPUTATION;  Surgeon: [name], MD;  Location: [place];  Service: Orthopedics;  Laterality: Right;   back lipoma resection  03/2006   BIOPSY  06/15/2018   Procedure: BIOPSY;  Surgeon: Jerene Bears, MD;  Location: [facility] ENDOSCOPY;  Service: Gastroenterology;;   CARDIAC CATHETERIZATION     ESOPHAGOGASTRODUODENOSCOPY (EGD) WITH PROPOFOL N/A 06/15/2018   Procedure: ESOPHAGOGASTRODUODENOSCOPY (EGD) WITH PROPOFOL;  Surgeon: Jerene Bears, MD;  Location: [facility] ENDOSCOPY;  Service: Gastroenterology;  Laterality: N/A;   KNEE ARTHROSCOPY Left 10/2008   Meniscus Repair   KNEE ARTHROSCOPY W/ ACL RECONSTRUCTION Left 1998   LEFT HEART CATHETERIZATION WITH CORONARY ANGIOGRAM N/A 08/11/2013    Procedure: LEFT HEART CATHETERIZATION WITH CORONARY ANGIOGRAM;  Surgeon: Pixie Casino, MD;  Location[facility] CATH LAB;  Service: Cardiovascular;  Laterality: N/A;   LOWER EXTREMITY ANGIOGRAPHY Right 11/10/2020   Procedure: LOWER EXTREMITY ANGIOGRAPHY;  Surgeon: Algernon Huxley, MD;  Location: [place] CV LAB;  Service: Cardiovascular;  Laterality: Right;   MENISCUS REPAIR Left 2001   TOTAL KNEE ARTHROPLASTY Left    UPPER GI ENDOSCOPY  10/2007   showed gastritis   Patient Active Problem List   Diagnosis Date Noted   Cervical radicular pain 02/07/2023   Cervical facet joint syndrome 02/07/2023   Degenerative disc disease, cervical 02/07/2023   Chronic pain syndrome 02/07/2023   Chronic painful diabetic neuropathy ([place]) 02/07/2023   Asymmetric blood pressures 10/16/2022   PAD (peripheral artery disease) ([place]) 07/17/2022   Osteomyelitis of fifth toe of right foot (HCC)    Alcohol use disorder, severe, dependence ([place]) 04/16/2019   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[place]) 04/16/2019   Cannabis hyperemesis syndrome concurrent with and due to cannabis abuse ([place]) 08/01/2018   Gastroesophageal reflux disease 07/17/2018   Long QT interval 07/16/2018   Early satiety    Esophagitis, [place] grade D    Hypercalcemia 06/09/2018   SIRS (systemic inflammatory response syndrome) ([place]) 06/09/2018  Hematemesis 06/09/2018   Type 2 diabetes mellitus with peripheral neuropathy ([place]) 06/09/2018   Closed displaced fracture of first metatarsal bone of left foot 07/31/2017   Acidosis 09/17/2016   Dehydration 09/17/2016   Abdominal pain 09/17/2016   DKA (diabetic ketoacidoses) 09/17/2016   Nausea and vomiting 09/17/2016   IDDM (insulin dependent diabetes mellitus)    Gastroparesis    Diabetic peripheral neuropathy ([place]) 11/30/2015   Chronic low back pain 11/30/2015   Coronary artery spasm ([place]) 08/17/2013   Hyperlipidemia 08/17/2013   Peripheral neuropathy  08/05/2013   Chest pain 08/05/2013   Fatigue 08/05/2013   Tachycardia 08/05/2013   DOE (dyspnea on exertion) 08/05/2013   Diabetes mellitus, insulin dependent (IDDM), uncontrolled 02/09/2008   Anxiety state 02/09/2008   Essential hypertension 02/09/2008   ALCOHOL ABUSE, HX OF 02/09/2008   LIVER FUNCTION TESTS, ABNORMAL, HX OF 02/09/2008   NEOPLASM, BENIGN, ESOPHAGUS 09/16/2007   Gastritis 09/16/2007    THERAPY DIAG:  Cervicalgia  Chronic right shoulder pain  Muscle weakness (generalized)   Rationale for Evaluation and Treatment Rehabilitation  REFERRING DIAG: Cervical radiculopathy, Cervical spondylosis   PERTINENT HISTORY: DM with LE/UE peripheral neuropathy   PRECAUTIONS/RESTRICTIONS: No   SUBJECTIVE:  Patient reports he is kind of hurting today because he had to work last night.   PAIN:  Are you having pain? Yes:  NPRS scale: 3/10 (8/10 at worst) Pain location: Neck Pain description: Tightness, sharp Aggravating factors: Turning neck, looking up Relieving factors: None   OBJECTIVE: (objective measures completed at initial evaluation unless otherwise dated) PATIENT SURVEYS:  FOTO 45% functional status   POSTURE:             Rounded shoulder and forward head posture   PALPATION: Tender to palpation bilateral cervical paraspinals, right upper trap        CERVICAL ROM:    Active ROM A/PROM (deg) eval   01/29/2023  Flexion 45   Extension 25   Right lateral flexion 10   Left lateral flexion 5   Right rotation 50   Left rotation 40 55   (Blank rows = not tested)   Patient reports neck tightness with all cervical motion   UPPER EXTREMITY ROM:   Active ROM Right eval Left eval  Shoulder flexion 130 150  Shoulder extension      Shoulder abduction      Shoulder adduction      Shoulder extension      Shoulder internal rotation      Shoulder external rotation      Elbow flexion      Elbow extension      Wrist flexion      Wrist extension       Wrist ulnar deviation      Wrist radial deviation      Wrist pronation      Wrist supination       (Blank rows = not tested)   UPPER EXTREMITY MMT:   MMT Right eval Left eval  Shoulder flexion 4 5  Shoulder extension      Shoulder abduction      Shoulder adduction      Shoulder extension      Shoulder internal rotation      Shoulder external rotation 4 5  Middle trapezius 4- 4-  Lower trapezius 4- 4-  Elbow flexion      Elbow extension      Wrist flexion      Wrist extension      Wrist  ulnar deviation      Wrist radial deviation      Wrist pronation      Wrist supination      Grip strength       (Blank rows = not tested)   Myotomes grossly WFL   CERVICAL SPECIAL TESTS:  Radicular testing negative for reproduction of shoulder pain, he does report neck pain and tightness that is most likely facet and muscular related   FUNCTIONAL TESTS:  DNF endurance: 14 seconds   HOME EXERCISE PROGRAM: Access Code: [email or web address]    TODAY'S TREATMENT: [facility] Adult PT Treatment:                                                DATE: 02/14/2023 Therapeutic Exercise: UBE L3 x 4 min (fwd/bwd) while taking subjective Sidelying thoracic rotation x 10 each Supine thoracic extension mobs over FR at various levels Cervical rotation SNAG x 10 each Manual Therapy: Skilled palpation and monitoring of muscle tensions while performing TPDN Cervical lateral mobs Suboccipital release with gentle manual traction Passive upper trap and levator scap stretch Prone thoracic extension thrust manipulation Trigger Point Dry Needling Treatment: Pre-treatment instruction: Patient instructed on dry needling rationale, procedures, and possible side effects including pain during treatment (achy,cramping feeling), bruising, drop of blood, lightheadedness, nausea, sweating. Patient Consent Given: Yes Education handout provided: Previously provided Muscles treated: bilateral upper trap, splenius cervicis and  suboccipitals  Needle size and number: .30x48mm x 8 Electrical stimulation performed: No Parameters: N/A Treatment response/outcome: Twitch response elicited and Palpable decrease in muscle tension Post-treatment instructions: Patient instructed to expect possible mild to moderate muscle soreness later today and/or tomorrow. Patient instructed in methods to reduce muscle soreness and to continue prescribed HEP. If patient was dry needled over the lung field, patient was instructed on signs and symptoms of pneumothorax and, however unlikely, to see immediate medical attention should they occur. Patient was also educated on signs and symptoms of infection and to seek medical attention should they occur. Patient verbalized understanding of these instructions and education.   [facility] Adult PT Treatment:                                                DATE: 01/29/2023 Therapeutic Exercise: UBE L3 x 4 min (fwd/bwd) while taking subjective Sidelying thoracic rotation x 10 each Supine thoracic extension mobs over FR at various levels Cervical rotation SNAG x 10 each Seated upper trap stretch 2 x 20 sec each Doorway pec stretch 3 x 20 sec Seated horizontal abduction with green x 15 Manual Therapy: Skilled palpation and monitoring of muscle tensions while performing TPDN Cervical PA mobs, lateral mobs Suboccipital release with gentle manual traction Passive upper trap and levator scap stretch Trigger Point Dry Needling Treatment: Pre-treatment instruction: Patient instructed on dry needling rationale, procedures, and possible side effects including pain during treatment (achy,cramping feeling), bruising, drop of blood, lightheadedness, nausea, sweating. Patient Consent Given: Yes Education handout provided: Previously provided Muscles treated: bilateral upper trap and suboccipitals  Needle size and number: .30x40mm x 7 Electrical stimulation performed: No Parameters: N/A Treatment response/outcome:  Twitch response elicited and Palpable decrease in muscle tension Post-treatment instructions: Patient instructed to expect possible mild to moderate muscle soreness  later today and/or tomorrow. Patient instructed in methods to reduce muscle soreness and to continue prescribed HEP. If patient was dry needled over the lung field, patient was instructed on signs and symptoms of pneumothorax and, however unlikely, to see immediate medical attention should they occur. Patient was also educated on signs and symptoms of infection and to seek medical attention should they occur. Patient verbalized understanding of these instructions and education.  TREATMENT 2/28: Therapeutic Exercise: - UBE 2.5'/2.5' fwd and backward for warm up while taking subjective - chin tuck - reviewing HEP including snags (L/R) Manual therapy: Chin tuck with OP Cervical distraction Lateral glides Cx spine Lateral glides throughout cervical spine Skilled palpation to identify trigger points for TDN STM to all listed muscles following TDN Trigger Point Dry-Needling  Treatment instructions: Expect mild to moderate muscle soreness. S/S of pneumothorax if dry needled over a lung field, and to seek immediate medical attention should they occur. Patient verbalized understanding of these instructions and education.  Patient Consent Given: Yes Education handout provided: No Muscles treated: bil UT, bil cervical paraspinals, bil sub occipitals Electrical stimulation performed: No Parameters: N/A Treatment response/outcome: twitch, pain reduction  PATIENT EDUCATION:  Education details: Exam findings, POC, HEP Person educated: Patient Education method: Explanation, Demonstration, Tactile cues, Verbal cues, and Handouts Education comprehension: verbalized understanding, returned demonstration, verbal cues required, tactile cues required, and needs further education   HOME EXERCISE PROGRAM: Access Code: [email or web address]     ASSESSMENT: CLINICAL IMPRESSION: Patient tolerated therapy well with no adverse effects. He continues to report increased neck pain with work related tasks that include repetitive rotating of the neck. Continued with TPDN of the neck with good therapeutic benefit and manual for spinal mobility. Patient did report improvement in symptoms following treatment and updated his HEP to further progress thoracic extension stretching to improve posture and reduce neck stiffness and pain. Patient would benefit from continued skilled PT to progress his mobility and strength in order to reduce pain and maximize functional ability.     OBJECTIVE IMPAIRMENTS: decreased activity tolerance, decreased endurance, decreased ROM, decreased strength, hypomobility, postural dysfunction, and pain.    ACTIVITY LIMITATIONS: carrying, lifting, and sitting   PARTICIPATION LIMITATIONS: driving and occupation   PERSONAL FACTORS: Past/current experiences and Time since onset of injury/illness/exacerbation are also affecting patient's functional outcome.     GOALS: Goals reviewed with patient? Yes   SHORT TERM GOALS: Target date: 02/07/2023   Patient will be I with initial HEP in order to progress with therapy. Baseline: HEP provided at eval 02/14/2023: independent Goal status: MET   2.  PT will review FOTO with patient by 3rd visit in order to understand expected progress and outcome with therapy. Baseline: FOTO assessed at eval 02/14/2023: reviewed Goal status: MET   3.  Patient will report neck pain </= 5/10 with turning neck while at work to reduce functional limitations Baseline: 8/10 pain 02/14/2023: 8/10 Goal status: ONGOING   LONG TERM GOALS: Target date: 03/07/2023   Patient will be I with final HEP to maintain progress from PT. Baseline: HEP provided at eval Goal status: INITIAL   2.  Patient will report >/= 61% status on FOTO to indicate improved functional ability. Baseline: 45% functional  status Goal status: INITIAL   3.  Patient will demonstrate bilateral cervical rotation >/= 60 deg in order to improve driving and ability to perform work tasks Baseline: cervical rotation right 50 deg, left 40 deg Goal status: INITIAL   4.  Patient will demonstrate  periscapular strength >/= 4/5 MMT in order to improve postural control with sitting and walking Baseline: grossly 4-/5 MMT Goal status: INITIAL   5.  Patient will report neck pain </= 2/10 with turning neck while at work to reduce functional limitations Baseline: 8/10 pain Goal status: INITIAL     PLAN: PT FREQUENCY: 1x/week   PT DURATION: 8 weeks   PLANNED INTERVENTIONS: Therapeutic exercises, Therapeutic activity, Neuromuscular re-education, Balance training, Gait training, Patient/Family education, Self Care, Joint mobilization, Joint manipulation, Aquatic Therapy, Dry Needling, Electrical stimulation, Spinal manipulation, Spinal mobilization, Cryotherapy, Moist heat, Taping, Manual therapy, and Re-evaluation   PLAN FOR NEXT SESSION: Review HEP and progress PRN, manual/dry needling for cervical paraspinals and upper trap region, mobs for cervical spine, progress DNF endurance and periscapular strengthening for postural control, incorporate pec stretching and thoracic mobility exercises   Hilda Blades, PT, DPT, LAT, ATC 02/14/23  12:45 PM Phone: [phone] Fax: [phone]

## 2023-02-14 NOTE — Patient Instructions (Signed)
Access Code: [email or web address] URL: [email or web address] Date: 02/14/2023 Prepared by: Hilda Blades  Exercises - Supine Cervical Retraction with Towel  - 1-2 x daily - 2 sets - 10 reps - 5 seconds hold - Sidelying Thoracic Lumbar Rotation  - 1-2 x daily - 10 reps - Seated Assisted Cervical Rotation with Towel  - 1-2 x daily - 2 sets - 15 reps - Seated Cervical Sidebending Stretch  - 1-2 x daily - 3 reps - 20 seconds hold - Doorway Pec Stretch at 90 Degrees Abduction  - 1-2 x daily - 3 reps - 2 seconds hold - Standing Shoulder Horizontal Abduction with Resistance  - 1-2 x daily - 2 sets - 10 reps - Shoulder External Rotation and Scapular Retraction with Resistance  - 1-2 x daily - 2 sets - 15 reps - Step Back Shoulder Stretch with Chair  - 1-2 x daily - 10 reps

## 2023-02-15 ENCOUNTER — Ambulatory Visit (INDEPENDENT_AMBULATORY_CARE_PROVIDER_SITE_OTHER): Payer: Medicare HMO | Admitting: Orthopedic Surgery

## 2023-02-15 ENCOUNTER — Encounter: Payer: Self-pay | Admitting: Orthopedic Surgery

## 2023-02-15 DIAGNOSIS — M47812 Spondylosis without myelopathy or radiculopathy, cervical region: Secondary | ICD-10-CM

## 2023-02-15 DIAGNOSIS — M4722 Other spondylosis with radiculopathy, cervical region: Secondary | ICD-10-CM

## 2023-02-15 DIAGNOSIS — M5412 Radiculopathy, cervical region: Secondary | ICD-10-CM

## 2023-02-15 NOTE — Progress Notes (Signed)
   Telephone Visit- Progress Note: Referring Physician:  Donnajean Lopes, MD [address] [place],  [place] [address]  Primary Physician:  Donnajean Lopes, MD  This visit was performed via telephone.  Patient location: home Provider location: office  I spent a total of 10 minutes non-face-to-face activities for this visit on the date of this encounter including review of current clinical condition and response to treatment.    Patient has given verbal consent to this telephone visits and we reviewed the limitations of a telephone visit. Patient wishes to proceed.    Chief Complaint:  recheck cervical spine  History of Present Illness: Grant [name] is a 52 y.o. male has a history of HTN, subclavian arterial stenosis, ETOH abuse, DM with DM neuropathy, gastric ulcer, sleep apnea, and PAD.   He has known cervical spondylosis with multilevel foraminal stenosis. Neck pain likely from underlying spondylosis. Right arm pain may be from right foraminal stenosis at C4-C5 or C6-C7.   He was sent to pain management for possible injections and to ENT to evaluate partially covered left mastoid opacification seen on cervical MRI.   Was seen by Dr. Holley Raring on 02/07/23- discussed cervical ESI, but he was improving with PT and wanted to hold off. He also saw ENT and was released to f/u prn.   He has been to 5 visits of PT and his pain is improved. Dry needling is helping. He is able to turn his head without sharp pain. He has intermittent neck pain and intermittent right arm pain. He still has numbness and tingling in right arm. Stretches are helping the most.   Conservative measures:  Physical therapy: has done 5 visits- started on 01/10/23 Multimodal medical therapy including regular antiinflammatories: advil, lyrica  Injections: No epidural steroid injections   Past Surgery: No spinal surgery.   Exam: No exam done as this was a telephone encounter.     Imaging: none  Assessment  and Plan: Mr. Bleakley is a pleasant 52 y.o. male with improvement in his pain with PT.   He now has intermittent neck pain and intermittent right arm pain. He still has numbness and tingling in right arm. Pain when turning his head is improved.   He has known cervical spondylosis with multilevel foraminal stenosis. Neck pain likely from underlying spondylosis. Right arm pain may be from right foraminal stenosis at C4-C5 or C6-C7.   Treatment options discussed with patient and following plan made:   - Continue with PT for cervical spine.  - Agree with holding on cervical ESIs for now. He will call Dr. Holley Raring if he decides to proceed.  - Phone follow up with me in 6 weeks for recheck.  Geronimo Boot PA-C Neurosurgery

## 2023-02-18 ENCOUNTER — Ambulatory Visit: Payer: Medicare HMO | Admitting: Physical Therapy

## 2023-02-18 ENCOUNTER — Other Ambulatory Visit: Payer: Self-pay

## 2023-02-18 ENCOUNTER — Encounter: Payer: Self-pay | Admitting: Physical Therapy

## 2023-02-18 DIAGNOSIS — E7800 Pure hypercholesterolemia, unspecified: Secondary | ICD-10-CM | POA: Diagnosis not present

## 2023-02-18 DIAGNOSIS — E1140 Type 2 diabetes mellitus with diabetic neuropathy, unspecified: Secondary | ICD-10-CM | POA: Diagnosis not present

## 2023-02-18 DIAGNOSIS — E11621 Type 2 diabetes mellitus with foot ulcer: Secondary | ICD-10-CM | POA: Diagnosis not present

## 2023-02-18 DIAGNOSIS — M6281 Muscle weakness (generalized): Secondary | ICD-10-CM | POA: Diagnosis not present

## 2023-02-18 DIAGNOSIS — G8929 Other chronic pain: Secondary | ICD-10-CM

## 2023-02-18 DIAGNOSIS — M25511 Pain in right shoulder: Secondary | ICD-10-CM | POA: Diagnosis not present

## 2023-02-18 DIAGNOSIS — Z9641 Presence of insulin pump (external) (internal): Secondary | ICD-10-CM | POA: Diagnosis not present

## 2023-02-18 DIAGNOSIS — I739 Peripheral vascular disease, unspecified: Secondary | ICD-10-CM | POA: Diagnosis not present

## 2023-02-18 DIAGNOSIS — M542 Cervicalgia: Secondary | ICD-10-CM

## 2023-02-18 DIAGNOSIS — E1165 Type 2 diabetes mellitus with hyperglycemia: Secondary | ICD-10-CM | POA: Diagnosis not present

## 2023-02-18 DIAGNOSIS — I10 Essential (primary) hypertension: Secondary | ICD-10-CM | POA: Diagnosis not present

## 2023-02-18 NOTE — Therapy (Signed)
OUTPATIENT PHYSICAL THERAPY TREATMENT NOTE   Patient Name: Grant [name] MRN: [id number] DOB:03-Apr-1971, 52 y.o., male Today's Date: 02/18/2023   PCP: Donnajean Lopes, MD REFERRING PROVIDER: [facility] Boot, PA-C      PT End of Session - 02/18/23 1320     Visit Number 6    Number of Visits 9    Date for PT Re-Evaluation 03/07/23    Authorization Type [employer] MCR    Authorization Time Period 01/16/2023 - 03/26/2023    Authorization - Visit Number 5    Authorization - Number of Visits 8    Progress Note Due on Visit 10    PT Start Time [id number]    PT Stop Time 1400    PT Time Calculation (min) 45 min    Activity Tolerance Patient tolerated treatment well    Behavior During Therapy [facility] for tasks assessed/performed                Past Medical History:  Diagnosis Date   Alcohol abuse    Anxiety    Arteritic AION (anterior ischemic optic neuropathy)    Chronic low back pain 11/30/2015   Chronic pain    Depression    Diabetic peripheral neuropathy ([place]) 11/30/2015   Gastric ulcer    Hypertension    Lumbar radiculopathy    Neuropathy    Sleep apnea    Type 2 diabetes mellitus ([place])    Past Surgical History:  Procedure Laterality Date   AMPUTATION Right 12/07/2020   Procedure: RIGHT FOOT 5TH RAY AMPUTATION;  Surgeon: [name], MD;  Location: [place];  Service: Orthopedics;  Laterality: Right;   back lipoma resection  03/2006   BIOPSY  06/15/2018   Procedure: BIOPSY;  Surgeon: Jerene Bears, MD;  Location: [facility] ENDOSCOPY;  Service: Gastroenterology;;   CARDIAC CATHETERIZATION     ESOPHAGOGASTRODUODENOSCOPY (EGD) WITH PROPOFOL N/A 06/15/2018   Procedure: ESOPHAGOGASTRODUODENOSCOPY (EGD) WITH PROPOFOL;  Surgeon: Jerene Bears, MD;  Location: [facility] ENDOSCOPY;  Service: Gastroenterology;  Laterality: N/A;   KNEE ARTHROSCOPY Left 10/2008   Meniscus Repair   KNEE ARTHROSCOPY W/ ACL RECONSTRUCTION Left 1998   LEFT HEART CATHETERIZATION WITH CORONARY ANGIOGRAM N/A 08/11/2013    Procedure: LEFT HEART CATHETERIZATION WITH CORONARY ANGIOGRAM;  Surgeon: Pixie Casino, MD;  Location: [facility] CATH LAB;  Service: Cardiovascular;  Laterality: N/A;   LOWER EXTREMITY ANGIOGRAPHY Right 11/10/2020   Procedure: LOWER EXTREMITY ANGIOGRAPHY;  Surgeon: Algernon Huxley, MD;  Location: [facility];  Service: Cardiovascular;  Laterality: Right;   MENISCUS REPAIR Left 2001   TOTAL KNEE ARTHROPLASTY Left    UPPER GI ENDOSCOPY  10/2007   showed gastritis   Patient Active Problem List   Diagnosis Date Noted   Cervical radicular pain 02/07/2023   Cervical facet joint syndrome 02/07/2023   Degenerative disc disease, cervical 02/07/2023   Chronic pain syndrome 02/07/2023   Chronic painful diabetic neuropathy ([place]) 02/07/2023   Asymmetric blood pressures 10/16/2022   PAD (peripheral artery disease) ([place]) 07/17/2022   Osteomyelitis of fifth toe of right foot ([place])    Alcohol use disorder, severe, dependence ([place]) 04/16/2019   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[place]) 04/16/2019   Cannabis hyperemesis syndrome concurrent with and due to cannabis abuse ([place]) 08/01/2018   Gastroesophageal reflux disease 07/17/2018   Long QT interval 07/16/2018   Early satiety    Esophagitis, [place] grade D    Hypercalcemia 06/09/2018   SIRS (systemic inflammatory response  syndrome) ([place]) 06/09/2018   Hematemesis 06/09/2018   Type 2 diabetes mellitus with peripheral neuropathy ([place]) 06/09/2018   Closed displaced fracture of first metatarsal bone of left foot 07/31/2017   Acidosis 09/17/2016   Dehydration 09/17/2016   Abdominal pain 09/17/2016   DKA (diabetic ketoacidoses) 09/17/2016   Nausea and vomiting 09/17/2016   IDDM (insulin dependent diabetes mellitus)    Gastroparesis    Diabetic peripheral neuropathy ([place]) 11/30/2015   Chronic low back pain 11/30/2015   Coronary artery spasm ([place]) 08/17/2013   Hyperlipidemia 08/17/2013   Peripheral  neuropathy 08/05/2013   Chest pain 08/05/2013   Fatigue 08/05/2013   Tachycardia 08/05/2013   DOE (dyspnea on exertion) 08/05/2013   Diabetes mellitus, insulin dependent (IDDM), uncontrolled 02/09/2008   Anxiety state 02/09/2008   Essential hypertension 02/09/2008   ALCOHOL ABUSE, HX OF 02/09/2008   LIVER FUNCTION TESTS, ABNORMAL, HX OF 02/09/2008   NEOPLASM, BENIGN, ESOPHAGUS 09/16/2007   Gastritis 09/16/2007    THERAPY DIAG:  Cervicalgia  Chronic right shoulder pain  Muscle weakness (generalized)   Rationale for Evaluation and Treatment Rehabilitation  REFERRING DIAG: Cervical radiculopathy, Cervical spondylosis   PERTINENT HISTORY: DM with LE/UE peripheral neuropathy   PRECAUTIONS/RESTRICTIONS: No   SUBJECTIVE: Patient reports he is feeling more stiffness today.  PAIN:  Are you having pain? Yes:  NPRS scale: 3/10 (8/10 at worst) Pain location: Neck Pain description: Tightness, sharp Aggravating factors: Turning neck, looking up Relieving factors: None   OBJECTIVE: (objective measures completed at initial evaluation unless otherwise dated) PATIENT SURVEYS:  FOTO 45% functional status  02/18/2024: 50%   POSTURE:             Rounded shoulder and forward head posture   PALPATION: Tender to palpation bilateral cervical paraspinals, right upper trap        CERVICAL ROM:    Active ROM A/PROM (deg) eval   01/29/2023  Flexion 45   Extension 25   Right lateral flexion 10   Left lateral flexion 5   Right rotation 50   Left rotation 40 55   (Blank rows = not tested)   Patient reports neck tightness with all cervical motion   UPPER EXTREMITY ROM:   Active ROM Right eval Left eval  Shoulder flexion 130 150  Shoulder extension      Shoulder abduction      Shoulder adduction      Shoulder extension      Shoulder internal rotation      Shoulder external rotation      Elbow flexion      Elbow extension      Wrist flexion      Wrist extension       Wrist ulnar deviation      Wrist radial deviation      Wrist pronation      Wrist supination       (Blank rows = not tested)   UPPER EXTREMITY MMT:   MMT Right eval Left eval  Shoulder flexion 4 5  Shoulder extension      Shoulder abduction      Shoulder adduction      Shoulder extension      Shoulder internal rotation      Shoulder external rotation 4 5  Middle trapezius 4- 4-  Lower trapezius 4- 4-  Elbow flexion      Elbow extension      Wrist flexion      Wrist extension      Wrist ulnar  deviation      Wrist radial deviation      Wrist pronation      Wrist supination      Grip strength       (Blank rows = not tested)   Myotomes grossly WFL   CERVICAL SPECIAL TESTS:  Radicular testing negative for reproduction of shoulder pain, he does report neck pain and tightness that is most likely facet and muscular related   FUNCTIONAL TESTS:  DNF endurance: 14 seconds   HOME EXERCISE PROGRAM: Access Code: [id number]    TODAY'S TREATMENT: [facility] Adult PT Treatment:                                                DATE: 02/18/2023 Therapeutic Exercise: UBE L3 x 4 min (fwd/bwd) while taking subjective Sidelying thoracic rotation x 10 each Cervical extension and rotation SNAG x 10 each Supine thoracic extension mobs over FR at various levels Manual Therapy: Skilled palpation and monitoring of muscle tensions while performing TPDN Cervical lateral mobs Suboccipital release with gentle manual traction Passive upper trap and levator scap stretch Trigger Point Dry Needling Treatment: Pre-treatment instruction: Patient instructed on dry needling rationale, procedures, and possible side effects including pain during treatment (achy,cramping feeling), bruising, drop of blood, lightheadedness, nausea, sweating. Patient Consent Given: Yes Education handout provided: Previously provided Muscles treated: bilateral upper trap, splenius cervicis and suboccipitals  Needle size and  number: .30x42mm x 8 Electrical stimulation performed: No Parameters: N/A Treatment response/outcome: Twitch response elicited and Palpable decrease in muscle tension Post-treatment instructions: Patient instructed to expect possible mild to moderate muscle soreness later today and/or tomorrow. Patient instructed in methods to reduce muscle soreness and to continue prescribed HEP. If patient was dry needled over the lung field, patient was instructed on signs and symptoms of pneumothorax and, however unlikely, to see immediate medical attention should they occur. Patient was also educated on signs and symptoms of infection and to seek medical attention should they occur. Patient verbalized understanding of these instructions and education.   [facility] Adult PT Treatment:                                                DATE: 02/14/2023 Therapeutic Exercise: UBE L3 x 4 min (fwd/bwd) while taking subjective Sidelying thoracic rotation x 10 each Supine thoracic extension mobs over FR at various levels Cervical rotation SNAG x 10 each Manual Therapy: Skilled palpation and monitoring of muscle tensions while performing TPDN Cervical lateral mobs Suboccipital release with gentle manual traction Passive upper trap and levator scap stretch Prone thoracic extension thrust manipulation Trigger Point Dry Needling Treatment: Pre-treatment instruction: Patient instructed on dry needling rationale, procedures, and possible side effects including pain during treatment (achy,cramping feeling), bruising, drop of blood, lightheadedness, nausea, sweating. Patient Consent Given: Yes Education handout provided: Previously provided Muscles treated: bilateral upper trap, splenius cervicis and suboccipitals  Needle size and number: .30x20mm x 8 Electrical stimulation performed: No Parameters: N/A Treatment response/outcome: Twitch response elicited and Palpable decrease in muscle tension Post-treatment instructions:  Patient instructed to expect possible mild to moderate muscle soreness later today and/or tomorrow. Patient instructed in methods to reduce muscle soreness and to continue prescribed HEP. If patient was dry needled  over the lung field, patient was instructed on signs and symptoms of pneumothorax and, however unlikely, to see immediate medical attention should they occur. Patient was also educated on signs and symptoms of infection and to seek medical attention should they occur. Patient verbalized understanding of these instructions and education.  [facility] Adult PT Treatment:                                                DATE: 01/29/2023 Therapeutic Exercise: UBE L3 x 4 min (fwd/bwd) while taking subjective Sidelying thoracic rotation x 10 each Supine thoracic extension mobs over FR at various levels Cervical rotation SNAG x 10 each Seated upper trap stretch 2 x 20 sec each Doorway pec stretch 3 x 20 sec Seated horizontal abduction with green x 15 Manual Therapy: Skilled palpation and monitoring of muscle tensions while performing TPDN Cervical PA mobs, lateral mobs Suboccipital release with gentle manual traction Passive upper trap and levator scap stretch Trigger Point Dry Needling Treatment: Pre-treatment instruction: Patient instructed on dry needling rationale, procedures, and possible side effects including pain during treatment (achy,cramping feeling), bruising, drop of blood, lightheadedness, nausea, sweating. Patient Consent Given: Yes Education handout provided: Previously provided Muscles treated: bilateral upper trap and suboccipitals  Needle size and number: .30x29mm x 7 Electrical stimulation performed: No Parameters: N/A Treatment response/outcome: Twitch response elicited and Palpable decrease in muscle tension Post-treatment instructions: Patient instructed to expect possible mild to moderate muscle soreness later today and/or tomorrow. Patient instructed in methods to reduce  muscle soreness and to continue prescribed HEP. If patient was dry needled over the lung field, patient was instructed on signs and symptoms of pneumothorax and, however unlikely, to see immediate medical attention should they occur. Patient was also educated on signs and symptoms of infection and to seek medical attention should they occur. Patient verbalized understanding of these instructions and education.  PATIENT EDUCATION:  Education details: HEP Person educated: Patient Education method: Education officer, environmental, Corporate treasurer cues, Verbal cues Education comprehension: verbalized understanding, returned demonstration, verbal cues required, tactile cues required, and needs further education   HOME EXERCISE PROGRAM: Access Code: [id number]    ASSESSMENT: CLINICAL IMPRESSION: Patient tolerated therapy well with no adverse effects. He does report an improvement in his functional ability this visit on FOTO. Therapy continues to focus on progressing his spinal mobility and reducing muscular tension, and progressing postural control. Continued with TPDN for the cervical region and pain reports good therapeutic benefit. He reports continued improvement after he does the exercises but still with stiffness and difficulty turning neck frequently. Patient would benefit from continued skilled PT to progress his mobility and strength in order to reduce pain and maximize functional ability.     OBJECTIVE IMPAIRMENTS: decreased activity tolerance, decreased endurance, decreased ROM, decreased strength, hypomobility, postural dysfunction, and pain.    ACTIVITY LIMITATIONS: carrying, lifting, and sitting   PARTICIPATION LIMITATIONS: driving and occupation   PERSONAL FACTORS: Past/current experiences and Time since onset of injury/illness/exacerbation are also affecting patient's functional outcome.     GOALS: Goals reviewed with patient? Yes   SHORT TERM GOALS: Target date: 02/07/2023   Patient will be  I with initial HEP in order to progress with therapy. Baseline: HEP provided at eval 02/14/2023: independent Goal status: MET   2.  PT will review FOTO with patient by 3rd visit in order to understand  expected progress and outcome with therapy. Baseline: FOTO assessed at eval 02/14/2023: reviewed Goal status: MET   3.  Patient will report neck pain </= 5/10 with turning neck while at work to reduce functional limitations Baseline: 8/10 pain 02/14/2023: 8/10 Goal status: ONGOING   LONG TERM GOALS: Target date: 03/07/2023   Patient will be I with final HEP to maintain progress from PT. Baseline: HEP provided at eval Goal status: INITIAL   2.  Patient will report >/= 61% status on FOTO to indicate improved functional ability. Baseline: 45% functional status 02/18/2024: 50% Goal status: INITIAL   3.  Patient will demonstrate bilateral cervical rotation >/= 60 deg in order to improve driving and ability to perform work tasks Baseline: cervical rotation right 50 deg, left 40 deg Goal status: INITIAL   4.  Patient will demonstrate periscapular strength >/= 4/5 MMT in order to improve postural control with sitting and walking Baseline: grossly 4-/5 MMT Goal status: INITIAL   5.  Patient will report neck pain </= 2/10 with turning neck while at work to reduce functional limitations Baseline: 8/10 pain Goal status: INITIAL     PLAN: PT FREQUENCY: 1x/week   PT DURATION: 8 weeks   PLANNED INTERVENTIONS: Therapeutic exercises, Therapeutic activity, Neuromuscular re-education, Balance training, Gait training, Patient/Family education, Self Care, Joint mobilization, Joint manipulation, Aquatic Therapy, Dry Needling, Electrical stimulation, Spinal manipulation, Spinal mobilization, Cryotherapy, Moist heat, Taping, Manual therapy, and Re-evaluation   PLAN FOR NEXT SESSION: Review HEP and progress PRN, manual/dry needling for cervical paraspinals and upper trap region, mobs for cervical spine,  progress DNF endurance and periscapular strengthening for postural control, incorporate pec stretching and thoracic mobility exercises   Hilda Blades, PT, DPT, LAT, ATC 02/18/23  2:16 PM Phone: [phone] Fax: [phone]

## 2023-03-06 ENCOUNTER — Ambulatory Visit (INDEPENDENT_AMBULATORY_CARE_PROVIDER_SITE_OTHER): Payer: Medicare HMO | Admitting: Podiatry

## 2023-03-06 ENCOUNTER — Encounter: Payer: Self-pay | Admitting: Podiatry

## 2023-03-06 DIAGNOSIS — Z89421 Acquired absence of other right toe(s): Secondary | ICD-10-CM

## 2023-03-06 DIAGNOSIS — M2141 Flat foot [pes planus] (acquired), right foot: Secondary | ICD-10-CM

## 2023-03-06 DIAGNOSIS — Q828 Other specified congenital malformations of skin: Secondary | ICD-10-CM

## 2023-03-06 DIAGNOSIS — L603 Nail dystrophy: Secondary | ICD-10-CM

## 2023-03-06 DIAGNOSIS — M2142 Flat foot [pes planus] (acquired), left foot: Secondary | ICD-10-CM

## 2023-03-06 DIAGNOSIS — E119 Type 2 diabetes mellitus without complications: Secondary | ICD-10-CM

## 2023-03-06 DIAGNOSIS — E1142 Type 2 diabetes mellitus with diabetic polyneuropathy: Secondary | ICD-10-CM | POA: Diagnosis not present

## 2023-03-06 DIAGNOSIS — M24159 Other articular cartilage disorders, unspecified hip: Secondary | ICD-10-CM | POA: Insufficient documentation

## 2023-03-06 DIAGNOSIS — B351 Tinea unguium: Secondary | ICD-10-CM

## 2023-03-07 ENCOUNTER — Other Ambulatory Visit: Payer: Self-pay

## 2023-03-07 ENCOUNTER — Ambulatory Visit: Payer: Medicare HMO | Attending: Orthopedic Surgery | Admitting: Physical Therapy

## 2023-03-07 ENCOUNTER — Encounter: Payer: Self-pay | Admitting: Physical Therapy

## 2023-03-07 DIAGNOSIS — G8929 Other chronic pain: Secondary | ICD-10-CM | POA: Insufficient documentation

## 2023-03-07 DIAGNOSIS — M25511 Pain in right shoulder: Secondary | ICD-10-CM | POA: Insufficient documentation

## 2023-03-07 DIAGNOSIS — M542 Cervicalgia: Secondary | ICD-10-CM | POA: Insufficient documentation

## 2023-03-07 DIAGNOSIS — M6281 Muscle weakness (generalized): Secondary | ICD-10-CM | POA: Diagnosis not present

## 2023-03-07 NOTE — Therapy (Signed)
OUTPATIENT PHYSICAL THERAPY TREATMENT NOTE   Patient Name: Grant [name] MRN: [id number] DOB:02/05/1971, 52 y.o., male Today's Date: 03/07/2023   PCP: Garlan Fillers, MD REFERRING PROVIDER: Drake Leach, PA-C      PT End of Session - 03/07/23 1528     Visit Number 7    Number of Visits 10    Date for PT Re-Evaluation 04/18/23    Authorization Type [employer] MCR    Authorization Time Period 01/16/2023 - 03/26/2023    Authorization - Visit Number 6    Authorization - Number of Visits 8    Progress Note Due on Visit 10    PT Start Time 1330    PT Stop Time 1415   15 min not billed for mechanical traction   PT Time Calculation (min) 45 min    Activity Tolerance Patient tolerated treatment well    Behavior During Therapy [facility] for tasks assessed/performed                 Past Medical History:  Diagnosis Date   Alcohol abuse    Anxiety    Arteritic AION (anterior ischemic optic neuropathy)    Chronic low back pain 11/30/2015   Chronic pain    Depression    Diabetic peripheral neuropathy 11/30/2015   Gastric ulcer    Hypertension    Lumbar radiculopathy    Neuropathy    Sleep apnea    Type 2 diabetes mellitus    Past Surgical History:  Procedure Laterality Date   AMPUTATION Right 12/07/2020   Procedure: RIGHT FOOT 5TH RAY AMPUTATION;  Surgeon: Nadara Mustard, MD;  Location: [facility] OR;  Service: Orthopedics;  Laterality: Right;   back lipoma resection  03/2006   BIOPSY  06/15/2018   Procedure: BIOPSY;  Surgeon: Beverley Fiedler, MD;  Location: [facility] ENDOSCOPY;  Service: Gastroenterology;;   CARDIAC CATHETERIZATION     ESOPHAGOGASTRODUODENOSCOPY (EGD) WITH PROPOFOL N/A 06/15/2018   Procedure: ESOPHAGOGASTRODUODENOSCOPY (EGD) WITH PROPOFOL;  Surgeon: Beverley Fiedler, MD;  Location: [facility] ENDOSCOPY;  Service: Gastroenterology;  Laterality: N/A;   KNEE ARTHROSCOPY Left 10/2008   Meniscus Repair   KNEE ARTHROSCOPY W/ ACL RECONSTRUCTION Left 1998   LEFT HEART CATHETERIZATION WITH  CORONARY ANGIOGRAM N/A 08/11/2013   Procedure: LEFT HEART CATHETERIZATION WITH CORONARY ANGIOGRAM;  Surgeon: Chrystie Nose, MD;  [facility] CATH LAB;  Service: Cardiovascular;  Laterality: N/A;   LOWER EXTREMITY ANGIOGRAPHY Right 11/10/2020   Procedure: LOWER EXTREMITY ANGIOGRAPHY;  Surgeon: Annice Needy, MD;  Location: [facility] INVASIVE CV LAB;  Service: Cardiovascular;  Laterality: Right;   MENISCUS REPAIR Left 2001   TOTAL KNEE ARTHROPLASTY Left    UPPER GI ENDOSCOPY  10/2007   showed gastritis   Patient Active Problem List   Diagnosis Date Noted   Articular cartilage disorder of hip 03/06/2023   Cervical radicular pain 02/07/2023   Cervical facet joint syndrome 02/07/2023   Degenerative disc disease, cervical 02/07/2023   Chronic pain syndrome 02/07/2023   Chronic painful diabetic neuropathy 02/07/2023   Asymmetric blood pressures 10/16/2022   PAD (peripheral artery disease) 07/17/2022   Lumbar radiculopathy 07/21/2021   Osteomyelitis of fifth toe of right foot    Alcohol use disorder, severe, dependence 04/16/2019   Alcohol-induced depressive disorder with moderate or severe use disorder 04/16/2019   Cannabis hyperemesis syndrome concurrent with and due to cannabis abuse 08/01/2018   Gastroesophageal reflux disease 07/17/2018   Long QT interval 07/16/2018   Early satiety    Esophagitis, [place] grade D    Hypercalcemia 06/09/2018   SIRS (systemic inflammatory response syndrome) 06/09/2018   Hematemesis 06/09/2018   Type 2 diabetes mellitus with peripheral neuropathy 06/09/2018   Closed displaced fracture of first metatarsal bone of left foot 07/31/2017   Acidosis 09/17/2016   Dehydration 09/17/2016   Abdominal pain 09/17/2016   DKA (diabetic ketoacidoses) 09/17/2016   Nausea and vomiting 09/17/2016   IDDM (insulin dependent diabetes mellitus)    Gastroparesis    Diabetic peripheral neuropathy 11/30/2015   Chronic low back pain 11/30/2015   Coronary artery spasm  08/17/2013   Hyperlipidemia 08/17/2013   Peripheral neuropathy 08/05/2013   Chest pain 08/05/2013   Fatigue 08/05/2013   Tachycardia 08/05/2013   DOE (dyspnea on exertion) 08/05/2013   Diabetes mellitus, insulin dependent (IDDM), uncontrolled 02/09/2008   Anxiety state 02/09/2008   Essential hypertension 02/09/2008   ALCOHOL ABUSE, HX OF 02/09/2008   LIVER FUNCTION TESTS, ABNORMAL, HX OF 02/09/2008   NEOPLASM, BENIGN, ESOPHAGUS 09/16/2007   Gastritis 09/16/2007    THERAPY DIAG:  Cervicalgia  Chronic right shoulder pain  Muscle weakness (generalized)   Rationale for Evaluation and Treatment Rehabilitation  REFERRING DIAG: Cervical radiculopathy, Cervical spondylosis   PERTINENT HISTORY: DM with LE/UE peripheral neuropathy   PRECAUTIONS/RESTRICTIONS: No   SUBJECTIVE: Patient reports he is dragging to day because he hasn't slept well the past 2 nights. Overall he does feel he has improved with his neck motion and pain.  PAIN:  Are you having pain? Yes:  NPRS scale: 4/10 (5-6/10 at worst) Pain location: Neck Pain description: Tightness, sharp Aggravating factors: Turning neck, looking up Relieving factors: None   OBJECTIVE: (objective measures completed at initial evaluation unless otherwise dated) PATIENT SURVEYS:  FOTO 45% functional status  02/18/2023: 50%  03/07/2023: 52%   POSTURE:             Rounded shoulder and forward head posture   PALPATION: Tender to palpation bilateral cervical paraspinals, right upper trap        CERVICAL ROM:    Active ROM A/PROM (deg) eval   01/29/2023   03/07/2023  Flexion 45    Extension 25    Right lateral flexion 10    Left lateral flexion 5    Right rotation 50  60  Left rotation 40 55 55   (Blank rows = not tested)   Patient reports neck tightness with all cervical motion   UPPER EXTREMITY ROM:   Active ROM Right eval Left eval  Shoulder flexion 130 150  Shoulder extension      Shoulder abduction       Shoulder adduction      Shoulder extension      Shoulder internal rotation      Shoulder external rotation      Elbow flexion      Elbow extension      Wrist flexion      Wrist extension      Wrist ulnar deviation      Wrist radial deviation      Wrist pronation      Wrist supination       (Blank rows = not tested)   UPPER EXTREMITY MMT:   MMT Right eval Left eval Rt / Lt 03/07/2023  Shoulder flexion 4 5   Shoulder extension       Shoulder abduction       Shoulder adduction       Shoulder extension       Shoulder internal  rotation       Shoulder external rotation 4 5   Middle trapezius 4- 4- 4 / 4  Lower trapezius 4- 4- 4- / 4-  Elbow flexion       Elbow extension       Wrist flexion       Wrist extension       Wrist ulnar deviation       Wrist radial deviation       Wrist pronation       Wrist supination       Grip strength        (Blank rows = not tested)   Myotomes grossly WFL   CERVICAL SPECIAL TESTS:  Radicular testing negative for reproduction of shoulder pain, he does report neck pain and tightness that is most likely facet and muscular related   FUNCTIONAL TESTS:  DNF endurance: 14 seconds   HOME EXERCISE PROGRAM: Access Code: [email or web address]    TODAY'S TREATMENT: [facility] Adult PT Treatment:                                                DATE: 03/07/2023 Therapeutic Exercise: UBE L3 x 4 min (fwd/bwd) while taking subjective Supine horizontal abduction with green 2 x 15 Seated double ER and scap retraction with green 2 x 15 Row FM 20# 2 x 15 Manual Therapy: Skilled palpation and monitoring of muscle tensions while performing TPDN Passive upper trap and levator scap stretch Prone thoracic thrust manipulation x 1 Trigger Point Dry Needling Treatment: Pre-treatment instruction: Patient instructed on dry needling rationale, procedures, and possible side effects including pain during treatment (achy,cramping feeling), bruising, drop of blood, lightheadedness,  nausea, sweating. Patient Consent Given: Yes Education handout provided: Previously provided Muscles treated: Bilateral upper trap, splenius cervicis and suboccipitals  Needle size and number: .30x6mm x 6 Electrical stimulation performed: No Parameters: N/A Treatment response/outcome: Twitch response elicited and Palpable decrease in muscle tension Post-treatment instructions: Patient instructed to expect possible mild to moderate muscle soreness later today and/or tomorrow. Patient instructed in methods to reduce muscle soreness and to continue prescribed HEP. If patient was dry needled over the lung field, patient was instructed on signs and symptoms of pneumothorax and, however unlikely, to see immediate medical attention should they occur. Patient was also educated on signs and symptoms of infection and to seek medical attention should they occur. Patient verbalized understanding of these instructions and education. Modalities: Mechanical traction cervical x 15 min (not billed) 25 deg with 30# pull intermittent   [facility] Adult PT Treatment:                                                DATE: 02/18/2023 Therapeutic Exercise: UBE L3 x 4 min (fwd/bwd) while taking subjective Sidelying thoracic rotation x 10 each Cervical extension and rotation SNAG x 10 each Supine thoracic extension mobs over FR at various levels Manual Therapy: Skilled palpation and monitoring of muscle tensions while performing TPDN Cervical lateral mobs Suboccipital release with gentle manual traction Passive upper trap and levator scap stretch Trigger Point Dry Needling Treatment: Pre-treatment instruction: Patient instructed on dry needling rationale, procedures, and possible side effects including pain during treatment (achy,cramping feeling), bruising, drop of blood, lightheadedness,  nausea, sweating. Patient Consent Given: Yes Education handout provided: Previously provided Muscles treated: bilateral upper trap,  splenius cervicis and suboccipitals  Needle size and number: .30x57mm x 8 Electrical stimulation performed: No Parameters: N/A Treatment response/outcome: Twitch response elicited and Palpable decrease in muscle tension Post-treatment instructions: Patient instructed to expect possible mild to moderate muscle soreness later today and/or tomorrow. Patient instructed in methods to reduce muscle soreness and to continue prescribed HEP. If patient was dry needled over the lung field, patient was instructed on signs and symptoms of pneumothorax and, however unlikely, to see immediate medical attention should they occur. Patient was also educated on signs and symptoms of infection and to seek medical attention should they occur. Patient verbalized understanding of these instructions and education.  [facility] Adult PT Treatment:                                                DATE: 02/14/2023 Therapeutic Exercise: UBE L3 x 4 min (fwd/bwd) while taking subjective Sidelying thoracic rotation x 10 each Supine thoracic extension mobs over FR at various levels Cervical rotation SNAG x 10 each Manual Therapy: Skilled palpation and monitoring of muscle tensions while performing TPDN Cervical lateral mobs Suboccipital release with gentle manual traction Passive upper trap and levator scap stretch Prone thoracic extension thrust manipulation Trigger Point Dry Needling Treatment: Pre-treatment instruction: Patient instructed on dry needling rationale, procedures, and possible side effects including pain during treatment (achy,cramping feeling), bruising, drop of blood, lightheadedness, nausea, sweating. Patient Consent Given: Yes Education handout provided: Previously provided Muscles treated: bilateral upper trap, splenius cervicis and suboccipitals  Needle size and number: .30x27mm x 8 Electrical stimulation performed: No Parameters: N/A Treatment response/outcome: Twitch response elicited and Palpable decrease  in muscle tension Post-treatment instructions: Patient instructed to expect possible mild to moderate muscle soreness later today and/or tomorrow. Patient instructed in methods to reduce muscle soreness and to continue prescribed HEP. If patient was dry needled over the lung field, patient was instructed on signs and symptoms of pneumothorax and, however unlikely, to see immediate medical attention should they occur. Patient was also educated on signs and symptoms of infection and to seek medical attention should they occur. Patient verbalized understanding of these instructions and education.  PATIENT EDUCATION:  Education details: POC extension, FOTO, HEP Person educated: Patient Education method: Explanation, Demonstration, Tactile cues, Verbal cues Education comprehension: verbalized understanding, returned demonstration, verbal cues required, tactile cues required, and needs further education   HOME EXERCISE PROGRAM: Access Code: [email or web address]    ASSESSMENT: CLINICAL IMPRESSION: Patient tolerated therapy well with no adverse effects. He continues to progress with his cervical range of motion and reports improvement in his functional ability on FOTO. Continued with TPDN for neck muscle tightness limiting his motion and progressed with postural strengthening with good tolerance. Trialed mechanical traction to reduce cervical tension and pain relief with good therapeutic benefit. No changes made to HEP. Patient would benefit from continued skilled PT to progress his mobility and strength in order to reduce pain and maximize functional ability, so will extend PT POC for 6 more weeks.     OBJECTIVE IMPAIRMENTS: decreased activity tolerance, decreased endurance, decreased ROM, decreased strength, hypomobility, postural dysfunction, and pain.    ACTIVITY LIMITATIONS: carrying, lifting, and sitting   PARTICIPATION LIMITATIONS: driving and occupation   PERSONAL FACTORS: Past/current experiences and  Time since  onset of injury/illness/exacerbation are also affecting patient's functional outcome.     GOALS: Goals reviewed with patient? Yes   SHORT TERM GOALS: Target date: 03/28/2023   Patient will be I with initial HEP in order to progress with therapy. Baseline: HEP provided at eval 02/14/2023: independent Goal status: MET   2.  PT will review FOTO with patient by 3rd visit in order to understand expected progress and outcome with therapy. Baseline: FOTO assessed at eval 02/14/2023: reviewed Goal status: MET   3.  Patient will report neck pain </= 5/10 with turning neck while at work to reduce functional limitations Baseline: 8/10 pain 02/14/2023: 8/10 03/07/2023: 5-6/10 Goal status: ONGOING   LONG TERM GOALS: Target date: 04/18/2023   Patient will be I with final HEP to maintain progress from PT. Baseline: HEP provided at eval 03/07/2023: progressing Goal status: ONGOING   2.  Patient will report >/= 61% status on FOTO to indicate improved functional ability. Baseline: 45% functional status 02/18/2024: 50% 03/07/2023: 52% Goal status: ONGOING   3.  Patient will demonstrate bilateral cervical rotation >/= 60 deg in order to improve driving and ability to perform work tasks Baseline: cervical rotation right 50 deg, left 40 deg 03/07/2023: right 60 deg, left 55 deg Goal status: ONGOING   4.  Patient will demonstrate periscapular strength >/= 4/5 MMT in order to improve postural control with sitting and walking Baseline: grossly 4-/5 MMT 03/07/2023: 4-/5 MMT Goal status: ONGOING   5.  Patient will report neck pain </= 2/10 with turning neck while at work to reduce functional limitations Baseline: 8/10 pain 03/07/2023: 5-6/10 Goal status: ONGOING     PLAN: PT FREQUENCY: 1x/week   PT DURATION: 6 weeks   PLANNED INTERVENTIONS: Therapeutic exercises, Therapeutic activity, Neuromuscular re-education, Balance training, Gait training, Patient/Family education, Self Care, Joint  mobilization, Joint manipulation, Aquatic Therapy, Dry Needling, Electrical stimulation, Spinal manipulation, Spinal mobilization, Cryotherapy, Moist heat, Taping, Manual therapy, and Re-evaluation   PLAN FOR NEXT SESSION: Review HEP and progress PRN, manual/dry needling for cervical paraspinals and upper trap region, mobs for cervical spine, progress DNF endurance and periscapular strengthening for postural control, incorporate pec stretching and thoracic mobility exercises   Rosana Hoesampbell Zaylia Riolo, PT, DPT, LAT, ATC 03/07/23  5:08 PM Phone: [phone] Fax: [phone]

## 2023-03-10 NOTE — Progress Notes (Signed)
ANNUAL DIABETIC FOOT EXAM  Subjective: Grant [name] presents today for annual diabetic foot examination.  Chief Complaint  Patient presents with   Diabetes    [facility] BS - 140 A1C - 5.5 LVPCP - 10/2023    Callouses    CALLUS 2 LEFT, 1 RIGHT        Patient confirms h/o diabetes.  Patient relates 19 year h/o diabetes.  Patient has h/o amputation(s):  5th ray amputation right foot.  Patient has been diagnosed with neuropathy.  Risk factors: diabetes, diabetic neuropathy, history of amputation right 5th ray, PAD, hyperlipidemia, h/o tobacco use in remission.  Grant Fillers, Grant [name] is patient's PCP.  Past Medical History:  Diagnosis Date   Alcohol abuse    Anxiety    Arteritic AION (anterior ischemic optic neuropathy)    Chronic low back pain 11/30/2015   Chronic pain    Depression    Diabetic peripheral neuropathy 11/30/2015   Gastric ulcer    Hypertension    Lumbar radiculopathy    Neuropathy    Sleep apnea    Type 2 diabetes mellitus    Patient Active Problem List   Diagnosis Date Noted   Articular cartilage disorder of hip 03/06/2023   Cervical radicular pain 02/07/2023   Cervical facet joint syndrome 02/07/2023   Degenerative disc disease, cervical 02/07/2023   Chronic pain syndrome 02/07/2023   Chronic painful diabetic neuropathy 02/07/2023   Asymmetric blood pressures 10/16/2022   PAD (peripheral artery disease) 07/17/2022   Lumbar radiculopathy 07/21/2021   Osteomyelitis of fifth toe of right foot    Alcohol use disorder, severe, dependence 04/16/2019   Alcohol-induced depressive disorder with moderate or severe use disorder 04/16/2019   Cannabis hyperemesis syndrome concurrent with and due to cannabis abuse 08/01/2018   Gastroesophageal reflux disease 07/17/2018   Long QT interval 07/16/2018   Early satiety    Esophagitis, [place] grade D    Hypercalcemia 06/09/2018   SIRS (systemic inflammatory response syndrome) 06/09/2018   Hematemesis  06/09/2018   Type 2 diabetes mellitus with peripheral neuropathy 06/09/2018   Closed displaced fracture of first metatarsal bone of left foot 07/31/2017   Acidosis 09/17/2016   Dehydration 09/17/2016   Abdominal pain 09/17/2016   DKA (diabetic ketoacidoses) 09/17/2016   Nausea and vomiting 09/17/2016   IDDM (insulin dependent diabetes mellitus)    Gastroparesis    Diabetic peripheral neuropathy 11/30/2015   Chronic low back pain 11/30/2015   Coronary artery spasm 08/17/2013   Hyperlipidemia 08/17/2013   Peripheral neuropathy 08/05/2013   Chest pain 08/05/2013   Fatigue 08/05/2013   Tachycardia 08/05/2013   DOE (dyspnea on exertion) 08/05/2013   Diabetes mellitus, insulin dependent (IDDM), uncontrolled 02/09/2008   Anxiety state 02/09/2008   Essential hypertension 02/09/2008   ALCOHOL ABUSE, HX OF 02/09/2008   LIVER FUNCTION TESTS, ABNORMAL, HX OF 02/09/2008   NEOPLASM, BENIGN, ESOPHAGUS 09/16/2007   Gastritis 09/16/2007   Past Surgical History:  Procedure Laterality Date   AMPUTATION Right 12/07/2020   Procedure: RIGHT FOOT 5TH RAY AMPUTATION;  Surgeon: Nadara Mustard, Grant [name];  Location: [facility] OR;  Service: Orthopedics;  Laterality: Right;   back lipoma resection  03/2006   BIOPSY  06/15/2018   Procedure: BIOPSY;  Surgeon: Beverley Fiedler, Grant [name];  Location: [facility] ENDOSCOPY;  Service: Gastroenterology;;   CARDIAC CATHETERIZATION     ESOPHAGOGASTRODUODENOSCOPY (EGD) WITH PROPOFOL N/A 06/15/2018   Procedure: ESOPHAGOGASTRODUODENOSCOPY (EGD) WITH PROPOFOL;  Surgeon: Beverley Fiedler, Grant [name];  Location: [facility] ENDOSCOPY;  Service: Gastroenterology;  Laterality: N/A;   KNEE ARTHROSCOPY Left 10/2008   Meniscus Repair   KNEE ARTHROSCOPY W/ ACL RECONSTRUCTION Left 1998   LEFT HEART CATHETERIZATION WITH CORONARY ANGIOGRAM N/A 08/11/2013   Procedure: LEFT HEART CATHETERIZATION WITH CORONARY ANGIOGRAM;  Surgeon: Chrystie Nose, Grant [name];  Location: [facility] CATH LAB;  Service: Cardiovascular;  Laterality: N/A;   LOWER  EXTREMITY ANGIOGRAPHY Right 11/10/2020   Procedure: LOWER EXTREMITY ANGIOGRAPHY;  Surgeon: Annice Needy, Grant [name];  Location: [facility] INVASIVE CV LAB;  Service: Cardiovascular;  Laterality: Right;   MENISCUS REPAIR Left 2001   TOTAL KNEE ARTHROPLASTY Left    UPPER GI ENDOSCOPY  10/2007   showed gastritis   Current Outpatient Medications on File Prior to Visit  Medication Sig Dispense Refill   aspirin EC 81 MG tablet Take 1 tablet PO daily 150 tablet 2   atorvastatin (LIPITOR) 10 MG tablet      diazepam (VALIUM) 5 MG tablet 1 po 30 minutes prior to MRI scan. May repeat x 1 right before MRI scan if needed. You will need a driver. (Patient not taking: Reported on 01/10/2023) 2 tablet 0   insulin aspart (NOVOLOG) 100 UNIT/ML injection Insulin pump     Insulin Human (INSULIN PUMP) SOLN Inject into the skin as directed.     losartan-hydrochlorothiazide (HYZAAR) 100-12.5 MG tablet Take 1 tablet by mouth at bedtime.     metoprolol succinate (TOPROL-XL) 50 MG 24 hr tablet Take 50 mg by mouth at bedtime.     pregabalin (LYRICA) 200 MG capsule Take 200 mg by mouth 3 (three) times daily.     traZODone (DESYREL) 100 MG tablet Take 100 mg by mouth at bedtime.     zolpidem (AMBIEN) 10 MG tablet Take 10 mg by mouth at bedtime.     No current facility-administered medications on file prior to visit.    Allergies  Allergen Reactions   Codeine Nausea And Vomiting   Cymbalta [Duloxetine Hcl]     Other reaction(s): worsened depression   Duloxetine     Other reaction(s): Other (See Comments)   Glucophage [Metformin]     Other reaction(s): diarrhea   Lexapro [Escitalopram]     Other reaction(s): nausea   Metformin Hcl Diarrhea and Nausea Only   Ozempic (0.25 Or 0.5 Mg-Dose) [Semaglutide(0.25 Or 0.5mg -Dos)]     Other reaction(s): vomiting   Tylox [Oxycodone-Acetaminophen] Nausea And Vomiting   Oxycodone     Other Reaction(s): GI Intolerance   Social History   Occupational History   Occupation: disability   Tobacco Use   Smoking status: Former    Packs/day: 0.50    Years: 20.00    Additional pack years: 0.00    Total pack years: 10.00    Types: Cigarettes    Quit date: 03/23/2016    Years since quitting: 6.9   Smokeless tobacco: Current    Types: Chew   Tobacco comments:     chews daily  Vaping Use   Vaping Use: Never used  Substance and Sexual Activity   Alcohol use: No    Comment: Recovered alcoholic   Drug use: No   Sexual activity: Not on file   Family History  Problem Relation Age of Onset   Diabetes Mother    Hypertension Mother    Esophageal cancer Father    Hypertension Father    Hyperlipidemia Father    Throat cancer Father    Alcohol abuse Brother    Brain cancer Maternal Grandfather    Stomach cancer Paternal Grandfather  Colon cancer Neg Hx    Rectal cancer Neg Hx    Immunization History  Administered Date(s) Administered   PFIZER(Purple Top)SARS-COV-2 Vaccination 02/29/2020, 03/23/2020     Review of Systems: Negative except as noted in the HPI.   Objective: There were no vitals filed for this visit.  Grant [name] is a pleasant 52 y.o. male in NAD. AAO X 3.  Vascular Examination: CFT <3 seconds b/l. DP/PT pulses faintly palpable b/l. Skin temperature gradient warm to warm b/l. No pain with calf compression. No ischemia or gangrene. No cyanosis or clubbing noted b/l. Pedal hair sparse.   Neurological Examination: Pt has subjective symptoms of neuropathy. Protective sensation diminished with 10g monofilament b/l. Vibratory sensation intact b/l.  Dermatological Examination: Pedal skin warm and supple b/l.   No open wounds. No interdigital macerations.  Toenails left 5th toe and 1-4 b/l thick, discolored, elongated with subungual debris and pain on dorsal palpation.    Macule noted plantar aspect left foot.  Porokeratotic lesion(s) submet head 5 b/l and sub 5th met base right lower extremity. No erythema, no edema, no drainage, no  fluctuance.  Musculoskeletal Examination: Muscle strength 5/5 to b/l LE. Lower extremity amputation(s): 5th ray amputation right foot.  Radiographs: None  Footwear Assessment: Does the patient wear appropriate shoes? Yes. Does the patient need inserts/orthotics? Yes.  Lab Results  Component Value Date   HGBA1C 9.4 (H) 06/09/2018   ADA Risk Categorization: High Risk  Patient has one or more of the following: Loss of protective sensation Absent pedal pulses Severe Foot deformity History of foot ulcer  Assessment: 1. Pain due to onychomycosis of toenails of both feet   2. Porokeratosis   3. History of partial ray amputation of fifth toe of right foot   4. Pes planus of both feet   5. Type 2 diabetes mellitus with peripheral neuropathy   6. Encounter for diabetic foot exam     Plan: -Patient was evaluated and treated. All patient's and/or POA's questions/concerns answered on today's visit. -Diabetic foot examination performed today. -He is wearing [place] sneakers which are adequate. He walks for exercise a few times per week. -Discussed and educated patient on diabetic foot care, especially with  regards to the vascular, neurological and musculoskeletal systems. -Continue diabetic foot care principles: inspect feet daily, monitor glucose as recommended by PCP and/or Endocrinologist, and follow prescribed diet per PCP, Endocrinologist and/or dietician. -Patient to continue soft, supportive shoe gear daily. -Toenails were debrided in length and girth 1-4 bilaterally and L 5th toe with sterile nail nippers and dremel without iatrogenic bleeding.  -Porokeratotic lesion(s) submet head 5 b/l and sub 5th met base right lower extremity pared and enucleated with sterile currette without incident. Total number of lesions debrided=3. -Patient/POA to call should there be question/concern in the interim. Return in about 3 months (around 06/05/2023).  Freddie Breech, DPM

## 2023-03-15 NOTE — Therapy (Signed)
OUTPATIENT PHYSICAL THERAPY TREATMENT NOTE  DISCHARGE   Patient Name: Grant [name] MRN: [id number] DOB:[birth year], 52 y.o., male Today's Date: 03/18/2023   PCP: Garlan Fillers, MD REFERRING PROVIDER: Drake Leach, PA-C      PT End of Session - 03/18/23 1149     Visit Number 8    Number of Visits 10    Date for PT Re-Evaluation 04/18/23    Authorization Type [employer] M[employer]    Authorization Time Period 01/16/2023 - 03/26/2023    Authorization - Visit Number 7    Authorization - Number of Visits 8    Progress Note Due on Visit 10    PT Start Time 1145    PT Stop Time 1230    PT Time Calculation (min) 45 min    Activity Tolerance Patient tolerated treatment well    Behavior During Therapy [facility] for tasks assessed/performed                  Past Medical History:  Diagnosis Date   Alcohol abuse    Anxiety    Arteritic AION (anterior ischemic optic neuropathy)    Chronic low back pain 11/30/2015   Chronic pain    Depression    Diabetic peripheral neuropathy 11/30/2015   Gastric ulcer    Hypertension    Lumbar radiculopathy    Neuropathy    Sleep apnea    Type 2 diabetes mellitus    Past Surgical History:  Procedure Laterality Date   AMPUTATION Right 12/07/2020   Procedure: RIGHT FOOT 5TH RAY AMPUTATION;  Surgeon: Nadara Mustard, MD;  Location: [facility] OR;  Service: Orthopedics;  Laterality: Right;   back lipoma resection  03/2006   BIOPSY  06/15/2018   Procedure: BIOPSY;  Surgeon: Beverley Fiedler, MD;  Location: [facility] ENDOSCOPY;  Service: Gastroenterology;;   CARDIAC CATHETERIZATION     ESOPHAGOGASTRODUODENOSCOPY (EGD) WITH PROPOFOL N/A 06/15/2018   Procedure: ESOPHAGOGASTRODUODENOSCOPY (EGD) WITH PROPOFOL;  Surgeon: Beverley Fiedler, MD;  Location: [facility] ENDOSCOPY;  Service: Gastroenterology;  Laterality: N/A;   KNEE ARTHROSCOPY Left 10/2008   Meniscus Repair   KNEE ARTHROSCOPY W/ ACL RECONSTRUCTION Left 1998   LEFT HEART CATHETERIZATION WITH CORONARY ANGIOGRAM N/A  08/11/2013   Procedure: LEFT HEART CATHETERIZATION WITH CORONARY ANGIOGRAM;  Surgeon: Chrystie Nose, MD;  Location: [facility] CATH LAB;  Service: Cardiovascular;  Laterality: N/A;   LOWER EXTREMITY ANGIOGRAPHY Right 11/10/2020   Procedure: LOWER EXTREMITY ANGIOGRAPHY;  Surgeon: Annice Needy, MD;  Location: [facility] INVASIVE CV LAB;  Service: Cardiovascular;  Laterality: Right;   MENISCUS REPAIR Left 2001   TOTAL KNEE ARTHROPLASTY Left    UPPER GI ENDOSCOPY  10/2007   showed gastritis   Patient Active Problem List   Diagnosis Date Noted   Articular cartilage disorder of hip 03/06/2023   Cervical radicular pain 02/07/2023   Cervical facet joint syndrome 02/07/2023   Degenerative disc disease, cervical 02/07/2023   Chronic pain syndrome 02/07/2023   Chronic painful diabetic neuropathy 02/07/2023   Asymmetric blood pressures 10/16/2022   PAD (peripheral artery disease) 07/17/2022   Lumbar radiculopathy 07/21/2021   Osteomyelitis of fifth toe of right foot    Alcohol use disorder, severe, dependence 04/16/2019   Alcohol-induced depressive disorder with moderate or severe use disorder 04/16/2019   Cannabis hyperemesis syndrome concurrent with and due to cannabis abuse 08/01/2018   Gastroesophageal reflux disease 07/17/2018   Long QT interval 07/16/2018   Early satiety    Esophagitis, [place] grade D  Hypercalcemia 06/09/2018   SIRS (systemic inflammatory response syndrome) 06/09/2018   Hematemesis 06/09/2018   Type 2 diabetes mellitus with peripheral neuropathy 06/09/2018   Closed displaced fracture of first metatarsal bone of left foot 07/31/2017   Acidosis 09/17/2016   Dehydration 09/17/2016   Abdominal pain 09/17/2016   DKA (diabetic ketoacidoses) 09/17/2016   Nausea and vomiting 09/17/2016   IDDM (insulin dependent diabetes mellitus)    Gastroparesis    Diabetic peripheral neuropathy 11/30/2015   Chronic low back pain 11/30/2015   Coronary artery spasm 08/17/2013    Hyperlipidemia 08/17/2013   Peripheral neuropathy 08/05/2013   Chest pain 08/05/2013   Fatigue 08/05/2013   Tachycardia 08/05/2013   DOE (dyspnea on exertion) 08/05/2013   Diabetes mellitus, insulin dependent (IDDM), uncontrolled 02/09/2008   Anxiety state 02/09/2008   Essential hypertension 02/09/2008   ALCOHOL ABUSE, HX OF 02/09/2008   LIVER FUNCTION TESTS, ABNORMAL, HX OF 02/09/2008   NEOPLASM, BENIGN, ESOPHAGUS 09/16/2007   Gastritis 09/16/2007    THERAPY DIAG:  Cervicalgia  Chronic right shoulder pain  Muscle weakness (generalized)   Rationale for Evaluation and Treatment Rehabilitation  REFERRING DIAG: Cervical radiculopathy, Cervical spondylosis   PERTINENT HISTORY: DM with LE/UE peripheral neuropathy   PRECAUTIONS/RESTRICTIONS: No   SUBJECTIVE: Patient reports some days are better than others, not having any pain today. Reports that if he stays looking straight ahead then he has no trouble, but if he looks side to side a lot then he gets the pain. He does report that the stretches and exercises do help at home. He feels independent with exercises at home so feels today can be his last visit due to financials.  PAIN:  Are you having pain? Yes:  NPRS scale: 1/10 (5-6/10 at worst) Pain location: Neck Pain description: Tightness, sharp Aggravating factors: Turning neck, looking up Relieving factors: None   OBJECTIVE: (objective measures completed at initial evaluation unless otherwise dated) PATIENT SURVEYS:  FOTO 45% functional status  02/18/2023: 50%  03/07/2023: 52% 03/18/2023: 54%   POSTURE:             Rounded shoulder and forward head posture   PALPATION: Tender to palpation bilateral cervical paraspinals, right upper trap        CERVICAL ROM:    Active ROM A/PROM (deg) eval   01/29/2023   03/07/2023  Flexion 45    Extension 25    Right lateral flexion 10    Left lateral flexion 5    Right rotation 50  60  Left rotation 40 55 55   (Blank rows  = not tested)   Patient reports neck tightness with all cervical motion   UPPER EXTREMITY ROM:   Active ROM Right eval Left eval  Shoulder flexion 130 150  Shoulder extension      Shoulder abduction      Shoulder adduction      Shoulder extension      Shoulder internal rotation      Shoulder external rotation      Elbow flexion      Elbow extension      Wrist flexion      Wrist extension      Wrist ulnar deviation      Wrist radial deviation      Wrist pronation      Wrist supination       (Blank rows = not tested)   UPPER EXTREMITY MMT:   MMT Right eval Left eval Rt / Lt 03/07/2023  Shoulder flexion 4  5   Shoulder extension       Shoulder abduction       Shoulder adduction       Shoulder extension       Shoulder internal rotation       Shoulder external rotation 4 5   Middle trapezius 4- 4- 4 / 4  Lower trapezius 4- 4- 4- / 4-  Elbow flexion       Elbow extension       Wrist flexion       Wrist extension       Wrist ulnar deviation       Wrist radial deviation       Wrist pronation       Wrist supination       Grip strength        (Blank rows = not tested)   Myotomes grossly WFL   CERVICAL SPECIAL TESTS:  Radicular testing negative for reproduction of shoulder pain, he does report neck pain and tightness that is most likely facet and muscular related   FUNCTIONAL TESTS:  DNF endurance: 14 seconds   TODAY'S TREATMENT: [facility] Adult PT Treatment:                                                DATE: 03/18/2023 Therapeutic Exercise: UBE L3 x 4 min (fwd/bwd) while taking subjective Seated cervical rotation SNAG x 10 each Sidelying thoracic rotation x 10 Supine thoracic extension mobs over FR x 10 Doorway pec stretch 3 x 20 sec Step back thoracic extension stretch at counter x 5 Manual Therapy: Skilled palpation and monitoring of muscle tensions while performing TPDN Suboccipital release with gentle manual traction Passive upper trap and levator scap  stretch Trigger Point Dry Needling Treatment: Pre-treatment instruction: Patient instructed on dry needling rationale, procedures, and possible side effects including pain during treatment (achy,cramping feeling), bruising, drop of blood, lightheadedness, nausea, sweating. Patient Consent Given: Yes Education handout provided: Previously provided Muscles treated: Bilateral upper trap and suboccipitals  Needle size and number: .30x90mm x 4, .30x1mm x 2 Electrical stimulation performed: No Parameters: N/A Treatment response/outcome: Twitch response elicited and Palpable decrease in muscle tension Post-treatment instructions: Patient instructed to expect possible mild to moderate muscle soreness later today and/or tomorrow. Patient instructed in methods to reduce muscle soreness and to continue prescribed HEP. If patient was dry needled over the lung field, patient was instructed on signs and symptoms of pneumothorax and, however unlikely, to see immediate medical attention should they occur. Patient was also educated on signs and symptoms of infection and to seek medical attention should they occur. Patient verbalized understanding of these instructions and education. Modalities: Mechanical traction cervical x 15 min (not billed) 25 deg with 30# pull intermittent   [facility] Adult PT Treatment:                                                DATE: 03/07/2023 Therapeutic Exercise: UBE L3 x 4 min (fwd/bwd) while taking subjective Supine horizontal abduction with green 2 x 15 Seated double ER and scap retraction with green 2 x 15 Row FM 20# 2 x 15 Manual Therapy: Skilled palpation and monitoring of muscle tensions while performing TPDN Passive upper trap  and levator scap stretch Prone thoracic thrust manipulation x 1 Trigger Point Dry Needling Treatment: Pre-treatment instruction: Patient instructed on dry needling rationale, procedures, and possible side effects including pain during treatment  (achy,cramping feeling), bruising, drop of blood, lightheadedness, nausea, sweating. Patient Consent Given: Yes Education handout provided: Previously provided Muscles treated: Bilateral upper trap, splenius cervicis and suboccipitals  Needle size and number: .30x42mm x 6 Electrical stimulation performed: No Parameters: N/A Treatment response/outcome: Twitch response elicited and Palpable decrease in muscle tension Post-treatment instructions: Patient instructed to expect possible mild to moderate muscle soreness later today and/or tomorrow. Patient instructed in methods to reduce muscle soreness and to continue prescribed HEP. If patient was dry needled over the lung field, patient was instructed on signs and symptoms of pneumothorax and, however unlikely, to see immediate medical attention should they occur. Patient was also educated on signs and symptoms of infection and to seek medical attention should they occur. Patient verbalized understanding of these instructions and education. Modalities: Mechanical traction cervical x 15 min (not billed) 25 deg with 30# pull intermittent  [facility] Adult PT Treatment:                                                DATE: 02/18/2023 Therapeutic Exercise: UBE L3 x 4 min (fwd/bwd) while taking subjective Sidelying thoracic rotation x 10 each Cervical extension and rotation SNAG x 10 each Supine thoracic extension mobs over FR at various levels Manual Therapy: Skilled palpation and monitoring of muscle tensions while performing TPDN Cervical lateral mobs Suboccipital release with gentle manual traction Passive upper trap and levator scap stretch Trigger Point Dry Needling Treatment: Pre-treatment instruction: Patient instructed on dry needling rationale, procedures, and possible side effects including pain during treatment (achy,cramping feeling), bruising, drop of blood, lightheadedness, nausea, sweating. Patient Consent Given: Yes Education handout  provided: Previously provided Muscles treated: bilateral upper trap, splenius cervicis and suboccipitals  Needle size and number: .30x86mm x 8 Electrical stimulation performed: No Parameters: N/A Treatment response/outcome: Twitch response elicited and Palpable decrease in muscle tension Post-treatment instructions: Patient instructed to expect possible mild to moderate muscle soreness later today and/or tomorrow. Patient instructed in methods to reduce muscle soreness and to continue prescribed HEP. If patient was dry needled over the lung field, patient was instructed on signs and symptoms of pneumothorax and, however unlikely, to see immediate medical attention should they occur. Patient was also educated on signs and symptoms of infection and to seek medical attention should they occur. Patient verbalized understanding of these instructions and education.  PATIENT EDUCATION:  Education details: POC discharge, FOTO, HEP Person educated: Patient Education method: Explanation, Demonstration, Tactile cues, Verbal cues Education comprehension: verbalized understanding, returned demonstration, verbal cues required, tactile cues required, and needs further education   HOME EXERCISE PROGRAM: Access Code: [email or web address]    ASSESSMENT: CLINICAL IMPRESSION: Patient tolerated therapy well with no adverse effects. He does continue to exhibit slight limitations in his cervical motion and postural control, but reports because of financial reasons he would like to discharge from therapy. He is independent with his HEP and does report an improvement in his functional ability. Patient will be discharged from PT.     OBJECTIVE IMPAIRMENTS: decreased activity tolerance, decreased endurance, decreased ROM, decreased strength, hypomobility, postural dysfunction, and pain.    ACTIVITY LIMITATIONS: carrying, lifting, and sitting   PARTICIPATION LIMITATIONS: driving  and occupation   PERSONAL FACTORS: Past/current  experiences and Time since onset of injury/illness/exacerbation are also affecting patient's functional outcome.     GOALS: Goals reviewed with patient? Yes   SHORT TERM GOALS: Target date: 03/28/2023   Patient will be I with initial HEP in order to progress with therapy. Baseline: HEP provided at eval 02/14/2023: independent Goal status: MET   2.  PT will review FOTO with patient by 3rd visit in order to understand expected progress and outcome with therapy. Baseline: FOTO assessed at eval 02/14/2023: reviewed Goal status: MET   3.  Patient will report neck pain </= 5/10 with turning neck while at work to reduce functional limitations Baseline: 8/10 pain 02/14/2023: 8/10 03/07/2023: 5-6/10 03/18/2023: 3-4/10 Goal status: MET   LONG TERM GOALS: Target date: 04/18/2023   Patient will be I with final HEP to maintain progress from PT. Baseline: HEP provided at eval 03/07/2023: progressing 03/18/2023: independent Goal status: MET   2.  Patient will report >/= 61% status on FOTO to indicate improved functional ability. Baseline: 45% functional status 02/18/2024: 50% 03/07/2023: 52% 03/18/2023: 54% Goal status: PARTIALLY MET   3.  Patient will demonstrate bilateral cervical rotation >/= 60 deg in order to improve driving and ability to perform work tasks Baseline: cervical rotation right 50 deg, left 40 deg 03/07/2023: right 60 deg, left 55 deg 03/18/2023: right 60 deg, left 55 deg Goal status: PARTIALLY MET   4.  Patient will demonstrate periscapular strength >/= 4/5 MMT in order to improve postural control with sitting and walking Baseline: grossly 4-/5 MMT 03/07/2023: 4-/5 MMT 03/18/2023: 4-/5 MMT Goal status: PARTIALLY MET   5.  Patient will report neck pain </= 2/10 with turning neck while at work to reduce functional limitations Baseline: 8/10 pain 03/07/2023: 5-6/10 03/18/2023: 3-4/10 Goal status: PARTIALLY MET     PLAN: PT FREQUENCY: 1x/week   PT DURATION: 6 weeks    PLANNED INTERVENTIONS: Therapeutic exercises, Therapeutic activity, Neuromuscular re-education, Balance training, Gait training, Patient/Family education, Self Care, Joint mobilization, Joint manipulation, Aquatic Therapy, Dry Needling, Electrical stimulation, Spinal manipulation, Spinal mobilization, Cryotherapy, Moist heat, Taping, Manual therapy, and Re-evaluation   PLAN FOR NEXT SESSION: NA - discharge   Rosana Hoes, PT, DPT, LAT, ATC 03/18/23  1:10 PM Phone: [phone] Fax: [phone]    PHYSICAL THERAPY DISCHARGE SUMMARY  Visits from Start of Care: 8  Current functional level related to goals / functional outcomes: See above   Remaining deficits: See above   Education / Equipment: HEP   Patient agrees to discharge. Patient goals were partially met. Patient is being discharged due to being pleased with the current functional level.

## 2023-03-18 ENCOUNTER — Ambulatory Visit: Payer: Medicare HMO | Admitting: Physical Therapy

## 2023-03-18 ENCOUNTER — Other Ambulatory Visit: Payer: Self-pay

## 2023-03-18 ENCOUNTER — Encounter: Payer: Self-pay | Admitting: Physical Therapy

## 2023-03-18 DIAGNOSIS — G8929 Other chronic pain: Secondary | ICD-10-CM

## 2023-03-18 DIAGNOSIS — M542 Cervicalgia: Secondary | ICD-10-CM | POA: Diagnosis not present

## 2023-03-18 DIAGNOSIS — M25511 Pain in right shoulder: Secondary | ICD-10-CM | POA: Diagnosis not present

## 2023-03-18 DIAGNOSIS — M6281 Muscle weakness (generalized): Secondary | ICD-10-CM

## 2023-03-18 NOTE — Patient Instructions (Signed)
Access Code: [email or web address] URL: [email or web address] Date: 03/18/2023 Prepared by: Rosana Hoes  Exercises - Supine Cervical Retraction with Towel  - 1-2 x daily - 2 sets - 10 reps - 5 seconds hold - Thoracic Extension Mobilization on Foam Roll  - 1-2 x weekly - 10 reps - Sidelying Thoracic Lumbar Rotation  - 1-2 x daily - 10 reps - Seated Assisted Cervical Rotation with Towel  - 1-2 x daily - 2 sets - 15 reps - Seated Cervical Sidebending Stretch  - 1-2 x daily - 3 reps - 20 seconds hold - Doorway Pec Stretch at 90 Degrees Abduction  - 1-2 x daily - 3 reps - 20 seconds hold - Standing Shoulder Horizontal Abduction with Resistance  - 1 x daily - 2 sets - 10 reps - Shoulder External Rotation and Scapular Retraction with Resistance  - 1 x daily - 2 sets - 15 reps - Step Back Shoulder Stretch with Chair  - 1-2 x daily - 10 reps

## 2023-03-29 ENCOUNTER — Telehealth: Payer: Medicare HMO | Admitting: Orthopedic Surgery

## 2023-04-13 DIAGNOSIS — E1165 Type 2 diabetes mellitus with hyperglycemia: Secondary | ICD-10-CM | POA: Diagnosis not present

## 2023-04-18 ENCOUNTER — Encounter: Payer: Self-pay | Admitting: Sports Medicine

## 2023-04-18 ENCOUNTER — Ambulatory Visit: Payer: Medicare HMO | Admitting: Sports Medicine

## 2023-04-18 DIAGNOSIS — M7712 Lateral epicondylitis, left elbow: Secondary | ICD-10-CM | POA: Diagnosis not present

## 2023-04-18 DIAGNOSIS — M7711 Lateral epicondylitis, right elbow: Secondary | ICD-10-CM | POA: Diagnosis not present

## 2023-04-18 DIAGNOSIS — G8929 Other chronic pain: Secondary | ICD-10-CM | POA: Diagnosis not present

## 2023-04-18 DIAGNOSIS — M25522 Pain in left elbow: Secondary | ICD-10-CM | POA: Diagnosis not present

## 2023-04-18 MED ORDER — NITROGLYCERIN 0.2 MG/HR TD PT24: 0.2000 mg | MEDICATED_PATCH | Freq: Every day | TRANSDERMAL | 1 refills | Status: AC

## 2023-04-18 MED ORDER — CELECOXIB 100 MG PO CAPS: 100.0000 mg | ORAL_CAPSULE | Freq: Two times a day (BID) | ORAL | 1 refills | Status: AC

## 2023-04-18 NOTE — Patient Instructions (Signed)

## 2023-04-18 NOTE — Progress Notes (Signed)
Grant [name] - 52 y.o. male MRN [id number]  Date of birth: November 14, 1971  Office Visit Note: Visit Date: 04/18/2023 PCP: Garlan Fillers, MD Referred by: Garlan Fillers, MD  Subjective: Chief Complaint  Patient presents with   Left Elbow - Follow-up   Right Elbow - Follow-up   HPI: Grant [name] is a pleasant 52 y.o. male who presents today for follow-up of bilateral elbow pain.   Grant [name] has had issues with both of his elbows for many months now.  He has done extracorporeal shockwave therapy, home rehab exercises and counterforce strap, and NSAID therapy.  He did have an ultrasound-guided right lateral epicondyle injection on 01/17/2023 and an ultrasound-guided left lateral epicondylitis injection on 02/07/2023.  The right one still bothersome intermittently, although certainly is better after the injection.  In terms of the left elbow, the fairly good relief for about 1 month but then his pain has returned.  It is not bothering him very consistently and keeping him up at night and from doing daily activities.  He is now getting some pain over the medial aspect of the elbow and within the elbow crease.  Pain is worse with resisted extension and with supination/pronation of the wrist.  Pertinent ROS were reviewed with the patient and found to be negative unless otherwise specified above in HPI.   Assessment & Plan: Visit Diagnoses:  1. Chronic pain of left elbow   2. Lateral epicondylitis, left elbow   3. Lateral epicondylitis, right elbow    Plan: Discussed with Grant [name] that unfortunately he has had an exacerbation of his chronic lateral epicondylitis and elbow pain.  I did review his previous ultrasound which did show inflammation over the lateral epicondyle and extensor tendon origin as well as some insertional tendinosis but I cannot appreciate high-grade tearing on that examination.  His pain however is very bothersome and he only got good relief for about 1 month from the injection.   Given this, I think it is pertinent to obtain an MRI of the left elbow to evaluate this region more thoroughly.  He has failed all conservative measures including oral medication, injection therapy, shockwave, and I think that surgical intervention may need to be ascertained depending on the results of the MRI.  SCDs I will call him to see if he will continue follow-up with me or if he needs to see one of my orthopedic partners, likely Dr. Roda Shutters for surgical discussion.  In the interim, we discussed starting nitroglycerin patch 0.2 mg/h over the affected area to help with blood flow and augment healing response.  I did refill his Celebrex if needed take 100 mg once to twice daily as needed for pain.  His right elbow is slightly better although still bothers him, in the future we may need to obtain MRI to evaluate this quality in the future but will hold for now.  He will continue his home exercise therapy for both elbows in the interim.  Follow-up: Return for Will call to decide if f/u with Grant [name] or Roda Shutters depending on elbow MRI.   Meds & Orders:  Meds ordered this encounter  Medications   nitroGLYCERIN (NITRODUR - DOSED IN MG/24 HR) 0.2 mg/hr patch    Sig: Place 1 patch (0.2 mg total) onto the skin daily. Cut patch into fourths. Apply 1/4 patch to affected area on elbow.    Dispense:  30 patch    Refill:  1   celecoxib (CELEBREX) 100 MG capsule  Sig: Take 1 capsule (100 mg total) by mouth 2 (two) times daily.    Dispense:  60 capsule    Refill:  1    Orders Placed This Encounter  Procedures   MR Elbow Left w/o contrast     Procedures: No procedures performed      Clinical History: No specialty comments available.  He reports that he quit smoking about 7 years ago. His smoking use included cigarettes. He has a 10.00 pack-year smoking history. His smokeless tobacco use includes chew. No results for input(s): "HGBA1C", "LABURIC" in the last 8760 hours.  Objective:   Vital Signs: There were  no vitals taken for this visit.  Physical Exam  Gen: Well-appearing, in no acute distress; non-toxic CV: Well-perfused. Warm.  Resp: Breathing unlabored on room air; no wheezing. Psych: Fluid speech in conversation; appropriate affect; normal thought process Neuro: Sensation intact throughout. No gross coordination deficits.   Ortho Exam - Bilateral elbows: + TTP over the lateral epicondyle and over the common extensor tendon origin proximally.  Left is more tender than the right.  Positive provocative maneuvers such as third finger extension, Mulder's test and Cozen's test.  There is pain with resisted wrist pronation and supination.  The left elbow also has some TTP over the medial epicondyle as well.  Range of motion is intact from extension and flexion base without effusion.  Imaging:  Previous US: [place] Extrem Up Left Ltd Limited musculoskeletal ultrasound of the left upper extremity, left elbow  was performed today.  Evaluation of the radiocapitellar joint shows no  joint effusion, there is preserved cartilage in this location.  The  lateral epicondyle was seen in long and short axis without evidence of  tearing or avulsion.  There is a very small spur off the very proximal end  of the lateral epicondyle.  There is some mild insertional tendinosis of  the overlying common extensor tendon musculature without evidence of  high-grade tearing.    Past Medical/Family/Surgical/Social History: Medications & Allergies reviewed per EMR, new medications updated. 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) 04/16/2019   Cannabis hyperemesis syndrome concurrent with and due to cannabis abuse (HCC) 08/01/2018   Gastroesophageal reflux disease 07/17/2018   Long QT interval 07/16/2018   Early satiety    Esophagitis, [place] grade D    Hypercalcemia 06/09/2018   SIRS (systemic inflammatory response syndrome) (HCC) 06/09/2018   Hematemesis 06/09/2018   Type 2 diabetes mellitus with peripheral neuropathy (HCC) 06/09/2018   Closed displaced fracture of first metatarsal bone of left foot 07/31/2017   Acidosis 09/17/2016   Dehydration 09/17/2016   Abdominal pain 09/17/2016   DKA (diabetic ketoacidoses) 09/17/2016   Nausea and vomiting 09/17/2016   IDDM (insulin dependent diabetes mellitus)    Gastroparesis    Diabetic peripheral neuropathy (HCC) 11/30/2015   Chronic low back pain 11/30/2015   Coronary artery spasm (HCC) 08/17/2013   Hyperlipidemia 08/17/2013   Peripheral  neuropathy 08/05/2013   Chest pain 08/05/2013   Fatigue 08/05/2013   Tachycardia 08/05/2013   DOE (dyspnea on exertion) 08/05/2013   Diabetes mellitus, insulin dependent (IDDM), uncontrolled 02/09/2008   Anxiety state 02/09/2008   Essential hypertension 02/09/2008   ALCOHOL ABUSE, HX OF 02/09/2008   LIVER FUNCTION TESTS, ABNORMAL, HX OF 02/09/2008   NEOPLASM, BENIGN, ESOPHAGUS 09/16/2007   Gastritis 09/16/2007   Past Medical History:  Diagnosis Date   Alcohol abuse    Anxiety    Arteritic AION (anterior ischemic optic neuropathy)    Chronic low back pain 11/30/2015   Chronic pain    Depression    Diabetic peripheral neuropathy (HCC) 11/30/2015   Gastric ulcer    Hypertension    Lumbar radiculopathy    Neuropathy    Sleep apnea    Type 2 diabetes mellitus (HCC)    Family History  Problem  Relation Age of Onset   Diabetes Mother    Hypertension Mother    Esophageal cancer Father    Hypertension Father    Hyperlipidemia Father    Throat cancer Father    Alcohol abuse Brother    Brain cancer Maternal Grandfather    Stomach cancer Paternal Grandfather    Colon cancer Neg Hx    Rectal cancer Neg Hx    Past Surgical History:  Procedure Laterality Date   AMPUTATION Right 12/07/2020   Procedure: RIGHT FOOT 5TH RAY AMPUTATION;  Surgeon: Nadara Mustard, MD;  Location: [facility] OR;  Service: Orthopedics;  Laterality: Right;   back lipoma resection  03/2006   BIOPSY  06/15/2018   Procedure: BIOPSY;  Surgeon: Beverley Fiedler, MD;  Location: [facility] ENDOSCOPY;  Service: Gastroenterology;;   CARDIAC CATHETERIZATION     ESOPHAGOGASTRODUODENOSCOPY (EGD) WITH PROPOFOL N/A 06/15/2018   Procedure: ESOPHAGOGASTRODUODENOSCOPY (EGD) WITH PROPOFOL;  Surgeon: Beverley Fiedler, MD;  Location[facility] ENDOSCOPY;  Service: Gastroenterology;  Laterality: N/A;   KNEE ARTHROSCOPY Left 10/2008   Meniscus Repair   KNEE ARTHROSCOPY W/ ACL RECONSTRUCTION Left 1998   LEFT HEART CATHETERIZATION WITH CORONARY ANGIOGRAM N/A 08/11/2013   Procedure: LEFT HEART CATHETERIZATION WITH CORONARY ANGIOGRAM;  Surgeon: Chrystie Nose, MD;  Location: [facility] CATH LAB;  Service: Cardiovascular;  Laterality: N/A;   LOWER EXTREMITY ANGIOGRAPHY Right 11/10/2020   Procedure: LOWER EXTREMITY ANGIOGRAPHY;  Surgeon: Annice Needy, MD;  Location: [facility] INVASIVE CV LAB;  Service: Cardiovascular;  Laterality: Right;   MENISCUS REPAIR Left 2001   TOTAL KNEE ARTHROPLASTY Left    UPPER GI ENDOSCOPY  10/2007   showed gastritis   Social History   Occupational History   Occupation: disability  Tobacco Use   Smoking status: Former    Packs/day: 0.50    Years: 20.00    Additional pack years: 0.00    Total pack years: 10.00    Types: Cigarettes    Quit date: 03/23/2016    Years since quitting: 7.0   Smokeless tobacco: Current    Types: Chew    Tobacco comments:     chews daily  Vaping Use   Vaping Use: Never used  Substance and Sexual Activity   Alcohol use: No    Comment: Recovered alcoholic   Drug use: No   Sexual activity: Not on file

## 2023-04-18 NOTE — Progress Notes (Signed)
Not doing good  States the left is a lot worse than the right Injection may have helped for a month  Taking celebrex/tramadol for pain- not much relief

## 2023-04-23 DIAGNOSIS — E1165 Type 2 diabetes mellitus with hyperglycemia: Secondary | ICD-10-CM | POA: Diagnosis not present

## 2023-04-24 ENCOUNTER — Ambulatory Visit (HOSPITAL_COMMUNITY)
Admission: RE | Admit: 2023-04-24 | Discharge: 2023-04-24 | Disposition: A | Discharge status: Home / Self Care | Payer: Medicare HMO | Source: Ambulatory Visit | Attending: Sports Medicine | Admitting: Sports Medicine

## 2023-04-24 DIAGNOSIS — M25522 Pain in left elbow: Secondary | ICD-10-CM | POA: Diagnosis not present

## 2023-04-24 DIAGNOSIS — G8929 Other chronic pain: Secondary | ICD-10-CM | POA: Insufficient documentation

## 2023-04-24 DIAGNOSIS — M7712 Lateral epicondylitis, left elbow: Secondary | ICD-10-CM | POA: Diagnosis not present

## 2023-04-29 DIAGNOSIS — I10 Essential (primary) hypertension: Secondary | ICD-10-CM | POA: Diagnosis not present

## 2023-04-29 DIAGNOSIS — Z125 Encounter for screening for malignant neoplasm of prostate: Secondary | ICD-10-CM | POA: Diagnosis not present

## 2023-04-29 DIAGNOSIS — E1140 Type 2 diabetes mellitus with diabetic neuropathy, unspecified: Secondary | ICD-10-CM | POA: Diagnosis not present

## 2023-04-29 DIAGNOSIS — E785 Hyperlipidemia, unspecified: Secondary | ICD-10-CM | POA: Diagnosis not present

## 2023-05-06 DIAGNOSIS — G629 Polyneuropathy, unspecified: Secondary | ICD-10-CM | POA: Diagnosis not present

## 2023-05-06 DIAGNOSIS — Z0000 Encounter for general adult medical examination without abnormal findings: Secondary | ICD-10-CM | POA: Diagnosis not present

## 2023-05-06 DIAGNOSIS — Z794 Long term (current) use of insulin: Secondary | ICD-10-CM | POA: Diagnosis not present

## 2023-05-06 DIAGNOSIS — I739 Peripheral vascular disease, unspecified: Secondary | ICD-10-CM | POA: Diagnosis not present

## 2023-05-06 DIAGNOSIS — E1140 Type 2 diabetes mellitus with diabetic neuropathy, unspecified: Secondary | ICD-10-CM | POA: Diagnosis not present

## 2023-05-06 DIAGNOSIS — E119 Type 2 diabetes mellitus without complications: Secondary | ICD-10-CM | POA: Diagnosis not present

## 2023-05-06 DIAGNOSIS — I10 Essential (primary) hypertension: Secondary | ICD-10-CM | POA: Diagnosis not present

## 2023-05-06 DIAGNOSIS — E785 Hyperlipidemia, unspecified: Secondary | ICD-10-CM | POA: Diagnosis not present

## 2023-05-06 DIAGNOSIS — F1011 Alcohol abuse, in remission: Secondary | ICD-10-CM | POA: Diagnosis not present

## 2023-05-06 DIAGNOSIS — R82998 Other abnormal findings in urine: Secondary | ICD-10-CM | POA: Diagnosis not present

## 2023-05-08 ENCOUNTER — Other Ambulatory Visit: Payer: Self-pay

## 2023-05-08 ENCOUNTER — Encounter: Payer: Self-pay | Admitting: Sports Medicine

## 2023-05-08 ENCOUNTER — Ambulatory Visit (INDEPENDENT_AMBULATORY_CARE_PROVIDER_SITE_OTHER): Payer: Medicare HMO | Admitting: Sports Medicine

## 2023-05-08 DIAGNOSIS — M25522 Pain in left elbow: Secondary | ICD-10-CM

## 2023-05-08 DIAGNOSIS — G8929 Other chronic pain: Secondary | ICD-10-CM

## 2023-05-08 DIAGNOSIS — M7711 Lateral epicondylitis, right elbow: Secondary | ICD-10-CM

## 2023-05-08 DIAGNOSIS — M7712 Lateral epicondylitis, left elbow: Secondary | ICD-10-CM | POA: Diagnosis not present

## 2023-05-08 DIAGNOSIS — E1142 Type 2 diabetes mellitus with diabetic polyneuropathy: Secondary | ICD-10-CM | POA: Diagnosis not present

## 2023-05-08 MED ORDER — BUPIVACAINE HCL 0.25 % IJ SOLN
1.0000 mL | INTRAMUSCULAR | Status: AC | PRN
  Administered 2023-05-08: 1 mL

## 2023-05-08 MED ORDER — METHYLPREDNISOLONE ACETATE 40 MG/ML IJ SUSP
40.0000 mg | INTRAMUSCULAR | Status: AC | PRN
  Administered 2023-05-08: 40 mg

## 2023-05-08 MED ORDER — LIDOCAINE HCL 1 % IJ SOLN
1.0000 mL | INTRAMUSCULAR | Status: AC | PRN
  Administered 2023-05-08: 1 mL

## 2023-05-08 NOTE — Progress Notes (Signed)
Office & Procedure Note  Patient: Grant [name]             Date of Birth: [birth year]           MRN: [id number]             Visit Date: 05/08/2023  HPI: Grant [name] is a pleasant 52 year-old male who presents for acute on chronic left > right elbow lateral epicondylitis.  Both of the elbows still bother him at times but the left is certainly much worse.  He did have an ultrasound-guided left lateral epicondylitis injection about 3 months ago which did help improve his pain, however is starting to return. Had ultrasound-guided right lateral epicondyle injection on 01/17/2023.  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  He is on Celebrex 100 mg twice daily.  Has not used his nitroglycerin patch as much as of late.  Did recently have an MRI of the left elbow to rule out tearing which there was not any significant tearing, MRI did support lateral epicondylitis.  PE:  + TTP over the left lateral epicondyle and just distal to it.  Positive multilane sign.  Right elbow is less painful and tender than the left.  Full range of motion about the left elbow.  Imaging: MR Elbow Left w/o contrast CLINICAL DATA:  Chronic elbow pain  EXAM: MRI OF THE LEFT ELBOW WITHOUT CONTRAST  TECHNIQUE: Multiplanar, multisequence MR imaging of the elbow was performed. No intravenous contrast was administered.  COMPARISON:  None Available.  FINDINGS: TENDONS  Common forearm flexor origin: Intact  Common forearm extensor origin: Thickening and increased signal intensity about the common extensor origin suggesting lateral epicondylitis.  Biceps: Intact  Triceps: Intact  LIGAMENTS  Medial stabilizers: Intact  Lateral stabilizers:  Intact  Cartilage: No chondral defect.  Joint: No joint effusion. No synovitis.  Cubital tunnel: Normal.  Bones: No fracture or dislocation. No marrow abnormality.  Soft Tissues: Muscles  are normal. No fluid collection or hematoma.  IMPRESSION: 1. Thickening and increased signal intensity about the common extensor origin suggesting lateral epicondylitis. 2. No evidence of ligamentous injury. 3. No acute osseous abnormality.  Electronically Signed   By: Larose Hires D.O.   On: 04/29/2023 17:21    Visit Diagnoses:  1. Chronic pain of left elbow   2. Lateral epicondylitis, left elbow   3. Lateral epicondylitis, right elbow   4. Type 2 diabetes mellitus with peripheral neuropathy (HCC)    Procedures:  Hand/UE Inj: L elbow for lateral epicondylitis on 05/08/2023 12:18 PM Indications: pain Details: 25 G needle, ultrasound-guided dorsal approach Medications: 1 mL lidocaine 1 %; 1 mL bupivacaine 0.25 %; 40 mg methylPREDNISolone acetate 40 MG/ML Outcome: tolerated well, no immediate complications  Procedure: Lateral Epicondylitis (Common Extensor Tendon) Injection, Left Elbow After informed verbal consent was obtained, a timeout was performed.  Patient was lying supine on exam table.  Area overlying the affected lateral epicondyle was prepped with chloraprep and alcohol swabs. Then utilizing ultrasound guidance via an in-plane approach, the patient's lateral epicondyle and common extensor tendon was injected with 1:1:1 lidocaine:bupivicaine:depo-medrol with multiple needle fenestrations from a distal to proximal direction. Patient tolerated procedure well without immediate complications.  Procedure, treatment alternatives, risks and benefits explained, specific risks discussed. Consent was given by the patient. Immediately prior to procedure a time out was called to verify the correct patient, procedure, equipment, support staff and site/side marked  as required. Patient was prepped and draped in the usual sterile fashion.     Plan:  -Discussed all treatment options for both elbows, but his left is certainly much worse.  Reviewed future decision making decided to repeat an  ultrasound-guided steroid injection.  Discussed with him I likely would not repeat this as I do not want to do more than 2 injections in 1 year.  If for some reason this does not significantly calm down his pain, next step would be considering a PRP injection, I did provide him with information today.  There is also surgical option in terms of debridement, we will see how he responds to this injection.  I would like him to hold for the rest of this week on the gabapentin hip both the left and right lateral epicondyles home rehab exercises daily.  We discussed activity modifications he is looking at possibly switching roles in his job to avoid the repetitive activity. -He will continue his Celebrex 100 mg once or twice daily as needed, may use ice as well for pain control.  He was not finding good benefit from the nitroglycerin patch, he may continue this on the right elbow but would like him to stop this on the left given his recent corticosteroid injection. -He does have diabetes, did discuss transient elevation in glucose with the injection, he will continue to check with his monitor to make adjustments as needed with his insulin  Madelyn Brunner, DO Primary Care Sports Medicine Physician  [facility] - Orthopedics  This note was dictated using Dragon naturally speaking software and may contain errors in syntax, spelling, or content which have not been identified prior to signing this note.

## 2023-05-08 NOTE — Patient Instructions (Signed)
Grant [name]' Instructions and What to Expect for PRP Injections:  Platelet-rich plasma is used in musculoskeletal medicine to focus your own body's ability to heal. It has several well-done published randomized control trials (RCT) which demonstrate both its effectiveness and safety in many musculoskeletal conditions, including osteoarthritis, tendinopathies, and damaged vertebral discs. PRP has been in clinical use since the 1990's. Many people know that platelets form a clot if there is a cut in the skin. It turns out that platelets do not only form a clot, they also start the body's own repair process. When platelets activate to form a clot, they also release alpha granules which have hundreds of chemical messengers in them that initiate and organize repair to the damaged tissue. Precisely placing PRP into the site of injury will initiate the healing process by activating on the damaged cartilage or tendon. This is an inflammatory process, and inflammation is the vital first phase of Healing.  What to expect and how to prepare for PRP   2 weeks prior to the procedure: depending on the procedure, you may need to arrange for a driver to bring you home. IF you are having a lower extremity procedure, we can provide crutches as needed.   10 days prior to the procedure: Stop taking anti-inflammatory drugs like Ibuprofen/Motrin, Advil, Naprosyn, Celebrex, or Meloxicam. Even aspirin should be stopped (but need to discuss this with Grant [name] and your cardiologist beforehand). Let Grant [name] know if you have been taking prednisone or other corticosteroids in the last month.   The day before the procedure: thoroughly shower and clean your skin.    The day of the procedure: Wear loose-fitting clothing like sweatpants or shorts. If you are having an upper body procedure wear a top that can button or zip up.  PRP will initiate healing and a productive inflammation, and PRP therapy will make the  body part treated sore for 4 days to two weeks. Anti-inflammatory drugs (i.e. ibuprofen, Naprosyn, Celebrex) and corticosteroids such as prednisone can blunt or stop this process, so it is important to not take any anti-inflammatory drugs for 7 days before getting PRP therapy, or for at least three weeks after PRP therapy. Corticosteroid injections can blunt inflammation for 30 days, so let us know if you have had one recently. Depending on the body part injected, you may be in a sling or on crutches for several days. Just like wringing out a wet dishcloth, if you load or tense a tendon or ligament that has just been injected with PRP, some of the PRP injected will squish out. By keeping the body part treated relaxed by using a sling (for the shoulder or arm) or crutches (for hips and legs) for a few days, the PRP can bind in place and do its job.   You may need a driver to bring you home.  Tobacco/nicotine is a potent toxin and its use constricts small blood vessels which are needed for tissue repair.  Tobacco/nicotine use will limit the effectiveness of any treatment and stopping tobacco use is one of the single  greatest actions you can take to improve your health. Avoid toxins like alcohol, which inhibits and depresses the cells needed for tissue repair.  What happens during the PRP procedure?  Platelet rich plasma is made by taking some of your blood and performing a two-stage centrifuge process on it to concentrate the PRP. First, your blood is drawn into a syringe with a small amount of anti-coagulant in it (this  is to keep the blood from clotting during this process). The amount of blood drawn is usually about 10-30 milliliters, depending on how much PRP is needed for the treatment.  (There are 355 milliliters in a 12-ounce soda can for comparison).  Then the blood is transferred in a sterile fashion into a centrifuge tube. It is then centrifuged for the first cycle where the red  blood cells are isolated and discarded. In the second centrifuge cycle, the platelet-rich fraction of the remaining plasma is concentrated and placed in a syringe. The skin at the injection site is numbed with a small amount of topical cooling spray. Grant [name] will then precisely inject the PRP into the injury site using ultrasound guidance.  What to do after your procedure  I will give you specific medicine to control any discomfort you may have after the procedure. Avoid NSAIDs like ibuprofen. Acetaminophen can be used for mild pain.  Do your best not to tense or load the treated area during this time. After 3 days, unless otherwise instructed, the treated body part should be used and slowly moved through its full range of motion. It will be sore, but you will not be doing damage by moving it, in fact it needs to move to heal. If you were on crutches for a period of time, walking is ok once you are off the crutches. For now, avoid activities that specifically hurt you before being treated. Exercise is vital to good health and finding a way to cross train around your injury is important not only for your physical health, but for your mental health as well. Ask me about cross training options for your injury. Some brief (10 minutes or less) period of heat or ice therapy will not hurt the therapy, but it is not required. Usually, depending on the initial injury, physical therapy is started from two weeks to four weeks after injection. Improvements in pain and function should be expected from 8 weeks to 12 weeks after injection and some injuries may require more than one treatment.

## 2023-06-11 ENCOUNTER — Ambulatory Visit: Payer: Medicare HMO | Admitting: Podiatry

## 2023-06-11 ENCOUNTER — Encounter: Payer: Self-pay | Admitting: Podiatry

## 2023-06-11 DIAGNOSIS — Q828 Other specified congenital malformations of skin: Secondary | ICD-10-CM

## 2023-06-11 DIAGNOSIS — M79674 Pain in right toe(s): Secondary | ICD-10-CM

## 2023-06-11 DIAGNOSIS — M79675 Pain in left toe(s): Secondary | ICD-10-CM | POA: Diagnosis not present

## 2023-06-11 DIAGNOSIS — Z89421 Acquired absence of other right toe(s): Secondary | ICD-10-CM

## 2023-06-11 DIAGNOSIS — B351 Tinea unguium: Secondary | ICD-10-CM | POA: Diagnosis not present

## 2023-06-11 DIAGNOSIS — E1142 Type 2 diabetes mellitus with diabetic polyneuropathy: Secondary | ICD-10-CM

## 2023-06-11 NOTE — Progress Notes (Signed)
This patient presents to the office with chief complaint of long thick nails and diabetic feet.  This patient  says there  is  no pain and discomfort in their feet due to having no feeling.  This patient says the big toenails are long thick painful nails.  These nails are painful walking and wearing shoes.  He also has painful callus under the outside border both feet.     Patient is unable to  self treat his own nails . This patient presents  to the office today for treatment of the  long nails and callus.  General Appearance  Alert, conversant and in no acute stress.  Vascular  Dorsalis pedis and posterior tibial  pulses are palpable  bilaterally.  Capillary return is within normal limits  bilaterally. Temperature is within normal limits  bilaterally.  Neurologic  Senn-Weinstein monofilament wire test absent  bilaterally. Muscle power within normal limits bilaterally.  Nails Thick disfigured discolored nails with subungual debris  hallux nails bilaterally. No evidence of bacterial infection or drainage bilaterally.  Orthopedic  No limitations of motion of motion feet .  No crepitus or effusions noted.  No bony pathology or digital deformities noted. Amputation fifth ray right foot.  Skin  normotropic skin with no porokeratosis noted bilaterally.  No signs of infections or ulcers noted.   Callus sub 5th metabase  B/L.  Onychomycosis  Callus  B/L.  Exostosis sub 5th metabase  B/L.   Debride hallux nails with dremel tool.  Debride callus sub 5th  with # 15 blade and dremel tool.  Padding added to his shoes to off weight bear  callus RTC 10 weeks    Helane Gunther DPM

## 2023-06-26 DIAGNOSIS — E1165 Type 2 diabetes mellitus with hyperglycemia: Secondary | ICD-10-CM | POA: Diagnosis not present

## 2023-06-26 DIAGNOSIS — E7800 Pure hypercholesterolemia, unspecified: Secondary | ICD-10-CM | POA: Diagnosis not present

## 2023-06-26 DIAGNOSIS — I10 Essential (primary) hypertension: Secondary | ICD-10-CM | POA: Diagnosis not present

## 2023-06-26 DIAGNOSIS — E11621 Type 2 diabetes mellitus with foot ulcer: Secondary | ICD-10-CM | POA: Diagnosis not present

## 2023-06-26 DIAGNOSIS — Z9641 Presence of insulin pump (external) (internal): Secondary | ICD-10-CM | POA: Diagnosis not present

## 2023-06-26 DIAGNOSIS — I739 Peripheral vascular disease, unspecified: Secondary | ICD-10-CM | POA: Diagnosis not present

## 2023-06-26 DIAGNOSIS — E1140 Type 2 diabetes mellitus with diabetic neuropathy, unspecified: Secondary | ICD-10-CM | POA: Diagnosis not present

## 2023-07-02 DIAGNOSIS — E1165 Type 2 diabetes mellitus with hyperglycemia: Secondary | ICD-10-CM | POA: Diagnosis not present

## 2023-07-15 ENCOUNTER — Other Ambulatory Visit (INDEPENDENT_AMBULATORY_CARE_PROVIDER_SITE_OTHER): Payer: Self-pay | Admitting: Nurse Practitioner

## 2023-07-15 DIAGNOSIS — I771 Stricture of artery: Secondary | ICD-10-CM

## 2023-07-15 DIAGNOSIS — M542 Cervicalgia: Secondary | ICD-10-CM

## 2023-07-16 ENCOUNTER — Ambulatory Visit (INDEPENDENT_AMBULATORY_CARE_PROVIDER_SITE_OTHER): Payer: Medicare HMO | Admitting: Vascular Surgery

## 2023-07-16 ENCOUNTER — Ambulatory Visit (INDEPENDENT_AMBULATORY_CARE_PROVIDER_SITE_OTHER): Payer: Medicare HMO

## 2023-07-16 ENCOUNTER — Encounter (INDEPENDENT_AMBULATORY_CARE_PROVIDER_SITE_OTHER): Payer: Self-pay | Admitting: Vascular Surgery

## 2023-07-16 VITALS — BP 110/78 | HR 65 | Resp 16 | Wt 228.0 lb

## 2023-07-16 DIAGNOSIS — M542 Cervicalgia: Secondary | ICD-10-CM | POA: Diagnosis not present

## 2023-07-16 DIAGNOSIS — I739 Peripheral vascular disease, unspecified: Secondary | ICD-10-CM

## 2023-07-16 DIAGNOSIS — I771 Stricture of artery: Secondary | ICD-10-CM

## 2023-07-16 DIAGNOSIS — E1142 Type 2 diabetes mellitus with diabetic polyneuropathy: Secondary | ICD-10-CM

## 2023-07-16 DIAGNOSIS — I10 Essential (primary) hypertension: Secondary | ICD-10-CM

## 2023-07-16 DIAGNOSIS — E785 Hyperlipidemia, unspecified: Secondary | ICD-10-CM

## 2023-07-16 LAB — VAS US ABI WITH/WO TBI
Left ABI: 1.38
Right ABI: 1.28

## 2023-07-16 NOTE — Assessment & Plan Note (Signed)
ABIs today are 1.28 on the right and 1.38 on the left with triphasic waveforms and normal digital pressures and waveforms. Doing well almost 3 years after iliac intervention.  Continue aspirin and statin agent.  Recheck in 1 year.

## 2023-07-16 NOTE — Assessment & Plan Note (Signed)
Duplex today demonstrates 1 to 39% carotid artery stenosis bilaterally.  Both vertebral arteries are antegrade.  The left subclavian artery has normal flow dynamics but the right subclavian artery does suggest significant stenosis by duplex.  His symptoms are currently stable and moderate.  He does not desire any intervention at this time which is reasonable as long as his symptoms are tolerable.  He will continue his current medical regimen.  We will recheck him in 1 year.  If his symptoms worsen and he desires any further evaluation or intervention, he will contact our office.

## 2023-07-16 NOTE — Progress Notes (Signed)
MRN : [id number]  Grant [name] is a 52 y.o. (1971-02-05) male who presents with chief complaint of  Chief Complaint  Patient presents with   Follow-up    Ultrasound follow up  .  History of Present Illness: Patient returns today in follow up of multiple vascular issues.  He is about 3 years status post iliac intervention for short distance claudication symptoms.  His legs are doing well.  No disabling lower extremity claudication, ischemic rest pain, or ulceration.  ABIs today are 1.28 on the right and 1.38 on the left with triphasic waveforms and normal digital pressures and waveforms. He is also followed for subclavian artery disease.  He does describe claudication symptoms in the right arm particularly with vigorous activities or overhead lifting and activities.  No wounds.  No rest pain of the hand.  No major changes in his symptoms over the past several months. No neurologic symptoms. Duplex today demonstrates 1 to 39% carotid artery stenosis bilaterally.  Both vertebral arteries are antegrade.  The left subclavian artery has normal flow dynamics but the right subclavian artery does suggest significant stenosis by duplex.   Current Outpatient Medications  Medication Sig Dispense Refill   aspirin EC 81 MG tablet Take 1 tablet PO daily 150 tablet 2   atorvastatin (LIPITOR) 10 MG tablet      diazepam (VALIUM) 5 MG tablet 1 po 30 minutes prior to MRI scan. May repeat x 1 right before MRI scan if needed. You will need a driver. 2 tablet 0   insulin aspart (NOVOLOG) 100 UNIT/ML injection Insulin pump     Insulin Human (INSULIN PUMP) SOLN Inject into the skin as directed.     losartan-hydrochlorothiazide (HYZAAR) 100-12.5 MG tablet Take 1 tablet by mouth at bedtime.     metoprolol succinate (TOPROL-XL) 50 MG 24 hr tablet Take 50 mg by mouth at bedtime.     nitroGLYCERIN (NITRODUR - DOSED IN MG/24 HR) 0.2 mg/hr patch Place 1 patch (0.2 mg total) onto the skin daily. Cut patch into fourths.  Apply 1/4 patch to affected area on elbow. 30 patch 1   pregabalin (LYRICA) 200 MG capsule Take 200 mg by mouth 3 (three) times daily.     traZODone (DESYREL) 100 MG tablet Take 100 mg by mouth at bedtime.     zolpidem (AMBIEN) 10 MG tablet Take 10 mg by mouth at bedtime.     No current facility-administered medications for this visit.    Past Medical History:  Diagnosis Date   Alcohol abuse    Anxiety    Arteritic AION (anterior ischemic optic neuropathy)    Chronic low back pain 11/30/2015   Chronic pain    Depression    Diabetic peripheral neuropathy (HCC) 11/30/2015   Gastric ulcer    Hypertension    Lumbar radiculopathy    Neuropathy    Sleep apnea    Type 2 diabetes mellitus (HCC)     Past Surgical History:  Procedure Laterality Date   AMPUTATION Right 12/07/2020   Procedure: RIGHT FOOT 5TH RAY AMPUTATION;  Surgeon: Nadara Mustard, MD;  Location: [facility] OR;  Service: Orthopedics;  Laterality: Right;   back lipoma resection  03/2006   BIOPSY  06/15/2018   Procedure: BIOPSY;  Surgeon: Beverley Fiedler, MD;  Location: [facility] ENDOSCOPY;  Service: Gastroenterology;;   CARDIAC CATHETERIZATION     ESOPHAGOGASTRODUODENOSCOPY (EGD) WITH PROPOFOL N/A 06/15/2018   Procedure: ESOPHAGOGASTRODUODENOSCOPY (EGD) WITH PROPOFOL;  Surgeon: Beverley Fiedler, MD;  Location: [facility] ENDOSCOPY;  Service: Gastroenterology;  Laterality: N/A;   KNEE ARTHROSCOPY Left 10/2008   Meniscus Repair   KNEE ARTHROSCOPY W/ ACL RECONSTRUCTION Left 1998   LEFT HEART CATHETERIZATION WITH CORONARY ANGIOGRAM N/A 08/11/2013   Procedure: LEFT HEART CATHETERIZATION WITH CORONARY ANGIOGRAM;  Surgeon: Chrystie Nose, MD;  Location: [facility] CATH LAB;  Service: Cardiovascular;  Laterality: N/A;   LOWER EXTREMITY ANGIOGRAPHY Right 11/10/2020   Procedure: LOWER EXTREMITY ANGIOGRAPHY;  Surgeon: Annice Needy, MD;  Location: [facility] INVASIVE CV LAB;  Service: Cardiovascular;  Laterality: Right;   MENISCUS REPAIR Left 2001   TOTAL KNEE  ARTHROPLASTY Left    UPPER GI ENDOSCOPY  10/2007   showed gastritis     Social History   Tobacco Use   Smoking status: Former    Current packs/day: 0.00    Average packs/day: 0.5 packs/day for 20.0 years (10.0 ttl pk-yrs)    Types: Cigarettes    Start date: 03/23/1996    Quit date: 03/23/2016    Years since quitting: 7.3   Smokeless tobacco: Current    Types: Chew   Tobacco comments:     chews daily  Vaping Use   Vaping status: Never Used  Substance Use Topics   Alcohol use: No    Comment: Recovered alcoholic   Drug use: No      Family History  Problem Relation Age of Onset   Diabetes Mother    Hypertension Mother    Esophageal cancer Father    Hypertension Father    Hyperlipidemia Father    Throat cancer Father    Alcohol abuse Brother    Brain cancer Maternal Grandfather    Stomach cancer Paternal Grandfather    Colon cancer Neg Hx    Rectal cancer Neg Hx      Allergies  Allergen Reactions   Codeine Nausea And Vomiting   Cymbalta [Duloxetine Hcl]     Other reaction(s): worsened depression   Duloxetine     Other reaction(s): Other (See Comments)   Glucophage [Metformin]     Other reaction(s): diarrhea   Lexapro [Escitalopram]     Other reaction(s): nausea   Metformin Hcl Diarrhea and Nausea Only   Ozempic (0.25 Or 0.5 Mg-Dose) [Semaglutide(0.25 Or 0.5mg -Dos)]     Other reaction(s): vomiting   Tylox [Oxycodone-Acetaminophen] Nausea And Vomiting   Oxycodone     Other Reaction(s): GI Intolerance    REVIEW OF SYSTEMS (Negative unless checked)   Constitutional: [x] Weight loss  [] Fever  [] Chills Cardiac: [] Chest pain   [] Chest pressure   [] Palpitations   [] Shortness of breath when laying flat   [] Shortness of breath at rest   [] Shortness of breath with exertion. Vascular:  [] Pain in legs with walking   [] Pain in legs at rest   [] Pain in legs when laying flat   [] Claudication   [] Pain in feet when walking  [] Pain in feet at rest  [] Pain in feet when laying  flat   [] History of DVT   [] Phlebitis   [] Swelling in legs   [] Varicose veins   [] Non-healing ulcers Pulmonary:   [] Uses home oxygen   [] Productive cough   [] Hemoptysis   [] Wheeze  [] COPD   [] Asthma Neurologic:  [] Dizziness  [] Blackouts   [] Seizures   [] History of stroke   [] History of TIA  [] Aphasia   [] Temporary blindness   [] Dysphagia   [x] Weakness or numbness in arms   [x] Weakness or numbness in legs Musculoskeletal:  [] Arthritis   [] Joint swelling   [] Joint pain   []   Low back pain Hematologic:  [] Easy bruising  [] Easy bleeding   [] Hypercoagulable state   [] Anemic   Gastrointestinal:  [] Blood in stool   [] Vomiting blood  [] Gastroesophageal reflux/heartburn   [] Abdominal pain Genitourinary:  [] Chronic kidney disease   [] Difficult urination  [] Frequent urination  [] Burning with urination   [] Hematuria Skin:  [] Rashes   [] Ulcers   [] Wounds Psychological:  [] History of anxiety   []  History of major depression.  Physical Examination  BP 110/78 (BP Location: Left Arm)   Pulse 65   Resp 16   Wt 228 lb (103.4 kg)   BMI 31.36 kg/m  Gen:  WD/WN, NAD Head: [place]/AT, No temporalis wasting. Ear/Nose/Throat: Hearing grossly intact, nares w/o erythema or drainage Eyes: Conjunctiva clear. Sclera non-icteric Neck: Supple.  Trachea midline Pulmonary:  Good air movement, no use of accessory muscles.  Cardiac: RRR, no JVD Vascular:  Vessel Right Left  Radial 1+ Palpable 2+ Palpable                          PT 2+ Palpable 2+ Palpable  DP 2+ Palpable 2+ Palpable   Gastrointestinal: soft, non-tender/non-distended. No guarding/reflex.  Musculoskeletal: M/S 5/5 throughout.  No deformity or atrophy. No edema. Neurologic: Sensation grossly intact in extremities.  Symmetrical.  Speech is fluent.  Psychiatric: Judgment intact, Mood & affect appropriate for pt's clinical situation. Dermatologic: No rashes or ulcers noted.  No cellulitis or open wounds.      Labs No results found for this or any  previous visit (from the past 2160 hour(s)).  Radiology No results found.  Assessment/Plan Type 2 diabetes mellitus with peripheral neuropathy (HCC) blood glucose control important in reducing the progression of atherosclerotic disease. Also, involved in wound healing. On appropriate medications.     Hyperlipidemia lipid control important in reducing the progression of atherosclerotic disease. Continue statin therapy   Essential hypertension blood pressure control important in reducing the progression of atherosclerotic disease. On appropriate oral medications.  PAD (peripheral artery disease) (HCC) ABIs today are 1.28 on the right and 1.38 on the left with triphasic waveforms and normal digital pressures and waveforms. Doing well almost 3 years after iliac intervention.  Continue aspirin and statin agent.  Recheck in 1 year.  Subclavian artery stenosis, right (HCC) Duplex today demonstrates 1 to 39% carotid artery stenosis bilaterally.  Both vertebral arteries are antegrade.  The left subclavian artery has normal flow dynamics but the right subclavian artery does suggest significant stenosis by duplex.  His symptoms are currently stable and moderate.  He does not desire any intervention at this time which is reasonable as long as his symptoms are tolerable.  He will continue his current medical regimen.  We will recheck him in 1 year.  If his symptoms worsen and he desires any further evaluation or intervention, he will contact our office.    Festus Barren, MD  07/16/2023 9:54 AM    This note was created with Dragon medical transcription system.  Any errors from dictation are purely unintentional

## 2023-07-22 DIAGNOSIS — E1165 Type 2 diabetes mellitus with hyperglycemia: Secondary | ICD-10-CM | POA: Diagnosis not present

## 2023-07-23 ENCOUNTER — Ambulatory Visit: Payer: Medicare HMO | Admitting: Podiatry

## 2023-09-17 ENCOUNTER — Ambulatory Visit: Payer: Medicare HMO | Admitting: Podiatry

## 2023-09-17 ENCOUNTER — Encounter: Payer: Self-pay | Admitting: Podiatry

## 2023-09-17 VITALS — Ht 71.5 in | Wt 228.0 lb

## 2023-09-17 DIAGNOSIS — M79674 Pain in right toe(s): Secondary | ICD-10-CM

## 2023-09-17 DIAGNOSIS — M79675 Pain in left toe(s): Secondary | ICD-10-CM

## 2023-09-17 DIAGNOSIS — B351 Tinea unguium: Secondary | ICD-10-CM

## 2023-09-17 DIAGNOSIS — G4733 Obstructive sleep apnea (adult) (pediatric): Secondary | ICD-10-CM | POA: Insufficient documentation

## 2023-09-17 DIAGNOSIS — L03031 Cellulitis of right toe: Secondary | ICD-10-CM | POA: Diagnosis not present

## 2023-09-17 DIAGNOSIS — Z23 Encounter for immunization: Secondary | ICD-10-CM | POA: Diagnosis not present

## 2023-09-17 DIAGNOSIS — L02611 Cutaneous abscess of right foot: Secondary | ICD-10-CM

## 2023-09-17 DIAGNOSIS — I2510 Atherosclerotic heart disease of native coronary artery without angina pectoris: Secondary | ICD-10-CM | POA: Diagnosis not present

## 2023-09-17 DIAGNOSIS — E7800 Pure hypercholesterolemia, unspecified: Secondary | ICD-10-CM | POA: Diagnosis not present

## 2023-09-17 DIAGNOSIS — Z9641 Presence of insulin pump (external) (internal): Secondary | ICD-10-CM | POA: Diagnosis not present

## 2023-09-17 DIAGNOSIS — I739 Peripheral vascular disease, unspecified: Secondary | ICD-10-CM | POA: Diagnosis not present

## 2023-09-17 DIAGNOSIS — E1142 Type 2 diabetes mellitus with diabetic polyneuropathy: Secondary | ICD-10-CM

## 2023-09-17 DIAGNOSIS — E1140 Type 2 diabetes mellitus with diabetic neuropathy, unspecified: Secondary | ICD-10-CM | POA: Diagnosis not present

## 2023-09-17 DIAGNOSIS — E1165 Type 2 diabetes mellitus with hyperglycemia: Secondary | ICD-10-CM | POA: Diagnosis not present

## 2023-09-17 DIAGNOSIS — L84 Corns and callosities: Secondary | ICD-10-CM

## 2023-09-17 DIAGNOSIS — I10 Essential (primary) hypertension: Secondary | ICD-10-CM | POA: Diagnosis not present

## 2023-09-17 DIAGNOSIS — N320 Bladder-neck obstruction: Secondary | ICD-10-CM | POA: Diagnosis not present

## 2023-09-17 DIAGNOSIS — G629 Polyneuropathy, unspecified: Secondary | ICD-10-CM | POA: Diagnosis not present

## 2023-09-17 DIAGNOSIS — Z794 Long term (current) use of insulin: Secondary | ICD-10-CM | POA: Diagnosis not present

## 2023-09-17 MED ORDER — DOXYCYCLINE HYCLATE 100 MG PO CAPS: 100.0000 mg | ORAL_CAPSULE | Freq: Two times a day (BID) | ORAL | 0 refills | Status: AC

## 2023-09-20 DIAGNOSIS — E1165 Type 2 diabetes mellitus with hyperglycemia: Secondary | ICD-10-CM | POA: Diagnosis not present

## 2023-09-22 NOTE — Progress Notes (Signed)
Subjective:  Patient ID: Grant [name], male    DOB: [birth year],  MRN: [id number]  Grant [name] presents to clinic today for: at risk foot care. Pt has h/o NIDDM with PAD and callus(es) of both feet and painful thick toenails that are difficult to trim. Painful toenails interfere with ambulation. Aggravating factors include wearing enclosed shoe gear. Pain is relieved with periodic professional debridement. Painful calluses are aggravated when weightbearing with and without shoegear. Pain is relieved with periodic professional debridement.  Chief Complaint  Patient presents with   Diabetes    Pt is here for dfc, PCP was seen this morning, Last A1C was 5.8 Provider is Dr Eloise Harman.    PCP is Garlan Fillers, MD.  Allergies  Allergen Reactions   Codeine Nausea And Vomiting   Cymbalta [Duloxetine Hcl]     Other reaction(s): worsened depression   Duloxetine     Other reaction(s): Other (See Comments)   Glucophage [Metformin]     Other reaction(s): diarrhea   Lexapro [Escitalopram]     Other reaction(s): nausea   Metformin Hcl Diarrhea and Nausea Only   Ozempic (0.25 Or 0.5 Mg-Dose) [Semaglutide(0.25 Or 0.5mg -Dos)]     Other reaction(s): vomiting   Tylox [Oxycodone-Acetaminophen] Nausea And Vomiting   Oxycodone     Other Reaction(s): GI Intolerance    Review of Systems: Negative except as noted in the HPI.  Objective:  There were no vitals filed for this visit.  Grant [name] is a pleasant 52 y.o. male in NAD. AAO x 3.  Vascular Examination: Capillary refill time <3 seconds b/l LE. Palpable pedal pulses b/l LE. Digital hair present b/l. No pedal edema b/l. Skin temperature gradient WNL b/l. No varicosities b/l. Marland Kitchen  Dermatological Examination: Right 2nd digit with  mild edema and erythema at proximal nailfold. No underlying abscess.  Pedal skin with normal turgor, texture and tone b/l. No open wounds. No interdigital macerations b/l. Toenails 1-5 b/l thickened, discolored,  dystrophic with subungual debris. There is pain on palpation to dorsal aspect of nailplates. Hyperkeratotic lesion(s) submet head 5 left foot and sub 5th met base of both feet.  No erythema, no edema, no drainage, no fluctuance.  Neurological Examination: Protective sensation diminished with 10g monofilament b/l.  Musculoskeletal Examination: Muscle strength 5/5 to all lower extremity muscle groups bilaterally. Lower extremity amputation(s): 5th ray amputation right foot. Plantarflexed metatarsal(s) 5th metatarsal head left foot and 5th met base of both feet.  Assessment/Plan: 1. Pain due to onychomycosis of toenails of both feet   2. Cellulitis and abscess of toe of right foot   3. Callus   4. Type 2 diabetes mellitus with peripheral neuropathy (HCC)     Meds ordered this encounter  Medications   doxycycline (VIBRAMYCIN) 100 MG capsule    Sig: Take 1 capsule (100 mg total) by mouth 2 (two) times daily for 10 days.    Dispense:  20 capsule    Refill:  0   -Consent given for treatment as described below: -Examined patient. -Discussed cellulitis right 2nd toe. Sent Rx for doxycycline 100 mg po bid x 10 days.. -Patient to continue soft, supportive shoe gear daily. -Toenails were debrided in length and girth 1-4 bilaterally and L 5th toe with sterile nail nippers and dremel without iatrogenic bleeding.  -Callus(es) submet head 5 left foot and sub 5th met base of both feet pared utilizing sterile scalpel blade without complication or incident. Total number debrided =3. -Patient/POA to call should there be question/concern  in the interim.   Return in about 3 months (around 12/18/2023).  Freddie Breech, DPM

## 2023-09-26 DIAGNOSIS — E7800 Pure hypercholesterolemia, unspecified: Secondary | ICD-10-CM | POA: Diagnosis not present

## 2023-09-26 DIAGNOSIS — E1165 Type 2 diabetes mellitus with hyperglycemia: Secondary | ICD-10-CM | POA: Diagnosis not present

## 2023-10-03 DIAGNOSIS — I10 Essential (primary) hypertension: Secondary | ICD-10-CM | POA: Diagnosis not present

## 2023-10-03 DIAGNOSIS — E11621 Type 2 diabetes mellitus with foot ulcer: Secondary | ICD-10-CM | POA: Diagnosis not present

## 2023-10-03 DIAGNOSIS — I739 Peripheral vascular disease, unspecified: Secondary | ICD-10-CM | POA: Diagnosis not present

## 2023-10-03 DIAGNOSIS — E1165 Type 2 diabetes mellitus with hyperglycemia: Secondary | ICD-10-CM | POA: Diagnosis not present

## 2023-10-03 DIAGNOSIS — Z9641 Presence of insulin pump (external) (internal): Secondary | ICD-10-CM | POA: Diagnosis not present

## 2023-10-03 DIAGNOSIS — E7800 Pure hypercholesterolemia, unspecified: Secondary | ICD-10-CM | POA: Diagnosis not present

## 2023-10-03 DIAGNOSIS — E1140 Type 2 diabetes mellitus with diabetic neuropathy, unspecified: Secondary | ICD-10-CM | POA: Diagnosis not present

## 2023-10-20 DIAGNOSIS — E1165 Type 2 diabetes mellitus with hyperglycemia: Secondary | ICD-10-CM | POA: Diagnosis not present

## 2023-10-29 ENCOUNTER — Ambulatory Visit: Payer: Medicare HMO | Admitting: Orthopedic Surgery

## 2023-10-29 ENCOUNTER — Telehealth: Payer: Self-pay | Admitting: Orthopedic Surgery

## 2023-10-29 ENCOUNTER — Other Ambulatory Visit (INDEPENDENT_AMBULATORY_CARE_PROVIDER_SITE_OTHER): Payer: Self-pay

## 2023-10-29 DIAGNOSIS — M79671 Pain in right foot: Secondary | ICD-10-CM | POA: Diagnosis not present

## 2023-10-29 DIAGNOSIS — M216X9 Other acquired deformities of unspecified foot: Secondary | ICD-10-CM

## 2023-10-29 NOTE — Telephone Encounter (Signed)
Patient called and said that his right foot is sore and swollen. He is scared so he wants to be fitted in if possible. [phone]

## 2023-10-29 NOTE — Telephone Encounter (Signed)
Called pt and he will come in at 3:45 this afternoon.

## 2023-10-30 ENCOUNTER — Encounter: Payer: Self-pay | Admitting: Orthopedic Surgery

## 2023-10-30 NOTE — Progress Notes (Signed)
Office Visit Note   Patient: Grant [name]           Date of Birth: February 06, 1971           MRN: [id number] Visit Date: 10/29/2023              Requested by: Garlan Fillers, MD [address] [place],  [place] [address] PCP: Garlan Fillers, MD  Chief Complaint  Patient presents with   Right Foot - Pain      HPI: Patient is a 52 year old gentleman who is status post fifth ray amputation on the right.  Patient states that he has been having pain with ambulation beneath the lateral aspect of the right foot and beneath the first metatarsal head left foot.  Assessment & Plan: Visit Diagnoses:  1. Pain in right foot   2. Acquired cavovarus foot deformity, unspecified laterality     Plan: With patient's cavovarus deformity we need to modify the pressure points from his feet.  Recommended a sole orthotic on the right to provide offloading from the lateral column and forefoot.  May need to cut out the MTP pressure area of the great toe to 1 low the lateral column further.  Also recommended patient modified the left orthotic by cutting out the great toe and MTP joint to allow for plantarflexion of the first ray to offload the forefoot.  May need lateral posting bilaterally.  Follow-Up Instructions: No follow-ups on file.   Ortho Exam  Patient is alert, oriented, no adenopathy, well-dressed, normal affect, normal respiratory effort. Examination patient has swelling in both lower extremities with venous insufficiency patient appears to have some petechial bleeding from varicose veins of the dorsum of the third and fourth toes of the left foot.  Patient has a flexible cavovarus deformity bilaterally.  Patient does have hypertrophic callus base of the fifth metatarsal on the right foot and beneath the great toe MTP joint of the left foot.  Imaging: No results found. No images are attached to the encounter.  Labs: Lab Results  Component Value Date   HGBA1C 9.4 (H) 06/09/2018    ESRSEDRATE 2 11/30/2015   REPTSTATUS 12/12/2020 FINAL 12/07/2020   GRAMSTAIN NO WBC SEEN NO ORGANISMS SEEN  12/07/2020   CULT  12/07/2020    No growth aerobically or anaerobically. Performed at [facility], [address], [place], [place] [address]      Lab Results  Component Value Date   ALBUMIN 4.5 08/01/2018   ALBUMIN 3.7 07/28/2018   ALBUMIN 4.2 07/27/2018    Lab Results  Component Value Date   MG 2.0 07/30/2018   MG 2.0 07/28/2018   MG 1.8 07/27/2018   No results found for: "VD25OH"  No results found for: "PREALBUMIN"    Latest Ref Rng & Units 12/07/2020   10:27 AM 08/01/2018    7:49 AM 07/30/2018    4:00 AM  CBC EXTENDED  WBC 4.0 - 10.5 K/uL 11.8  12.6  8.1   RBC 4.22 - 5.81 MIL/uL 5.05  5.23  4.44   Hemoglobin 13.0 - 17.0 g/dL 10.2  72.5  36.6   HCT 39.0 - 52.0 % 42.7  43.8  38.4   Platelets 150 - 400 K/uL 254  222  175   NEUT# 1.7 - 7.7 K/uL  10.0    Lymph# 0.7 - 4.0 K/uL  1.5       There is no height or weight on file to calculate BMI.  Orders:  Orders Placed This Encounter  Procedures   XR Foot 2 Views Right   No orders of the defined types were placed in this encounter.    Procedures: No procedures performed  Clinical Data: No additional findings.  ROS:  All other systems negative, except as noted in the HPI. Review of Systems  Objective: Vital Signs: There were no vitals taken for this visit.  Specialty Comments:  No specialty comments available.  PMFS History: Patient Active Problem List   Diagnosis Date Noted   Bladder outlet obstruction 09/17/2023   Obstructive sleep apnea 09/17/2023   Subclavian artery stenosis, right (HCC) 07/16/2023   Articular cartilage disorder of hip 03/06/2023   Cervical radicular pain 02/07/2023   Cervical facet joint syndrome 02/07/2023   Degenerative disc disease, cervical 02/07/2023   Chronic pain syndrome 02/07/2023   Chronic painful diabetic neuropathy (HCC) 02/07/2023   Asymmetric blood  pressures 10/16/2022   PAD (peripheral artery disease) (HCC) 07/17/2022   Lumbar radiculopathy 07/21/2021   Osteomyelitis of fifth toe of right foot (HCC)    Alcohol use disorder, severe, dependence (HCC) 04/16/2019   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) 04/16/2019   Cannabis hyperemesis syndrome concurrent with and due to cannabis abuse (HCC) 08/01/2018   Gastroesophageal reflux disease 07/17/2018   Long QT interval 07/16/2018   Early satiety    Esophagitis, [place] grade D    Hypercalcemia 06/09/2018   SIRS (systemic inflammatory response syndrome) (HCC) 06/09/2018   Hematemesis 06/09/2018   Type 2 diabetes mellitus with peripheral neuropathy (HCC) 06/09/2018   Closed displaced fracture of first metatarsal bone of left foot 07/31/2017   Acidosis 09/17/2016   Dehydration 09/17/2016   Abdominal pain 09/17/2016   DKA (diabetic ketoacidosis) (HCC) 09/17/2016   Nausea and vomiting 09/17/2016   IDDM (insulin dependent diabetes mellitus)    Gastroparesis    Diabetic peripheral neuropathy (HCC) 11/30/2015   Chronic low back pain 11/30/2015   Coronary artery spasm (HCC) 08/17/2013   Hyperlipidemia 08/17/2013   Peripheral neuropathy 08/05/2013   Chest pain 08/05/2013   Fatigue 08/05/2013   Tachycardia 08/05/2013   DOE (dyspnea on exertion) 08/05/2013   Diabetes mellitus, insulin dependent (IDDM), uncontrolled 02/09/2008   Anxiety state 02/09/2008   Essential hypertension 02/09/2008   ALCOHOL ABUSE, HX OF 02/09/2008   LIVER FUNCTION TESTS, ABNORMAL, HX OF 02/09/2008   NEOPLASM, BENIGN, ESOPHAGUS 09/16/2007   Gastritis 09/16/2007   Past Medical History:  Diagnosis Date   Alcohol abuse    Anxiety    Arteritic AION (anterior ischemic optic neuropathy)    Chronic low back pain 11/30/2015   Chronic pain    Depression    Diabetic peripheral neuropathy (HCC) 11/30/2015   Gastric ulcer    Hypertension    Lumbar radiculopathy     Neuropathy    Sleep apnea    Type 2 diabetes mellitus (HCC)     Family History  Problem Relation Age of Onset   Diabetes Mother    Hypertension Mother    Esophageal cancer Father    Hypertension Father    Hyperlipidemia Father    Throat cancer Father    Alcohol abuse Brother    Brain cancer Maternal Grandfather    Stomach cancer Paternal Grandfather    Colon cancer Neg Hx    Rectal cancer Neg Hx     Past Surgical History:  Procedure Laterality Date   AMPUTATION Right 12/07/2020   Procedure: RIGHT FOOT 5TH RAY AMPUTATION;  Surgeon: Aldean Baker  V, MD;  Location: [facility] OR;  Service: Orthopedics;  Laterality: Right;   back lipoma resection  03/2006   BIOPSY  06/15/2018   Procedure: BIOPSY;  Surgeon: Beverley Fiedler, MD;  Location: [facility] ENDOSCOPY;  Service: Gastroenterology;;   CARDIAC CATHETERIZATION     ESOPHAGOGASTRODUODENOSCOPY (EGD) WITH PROPOFOL N/A 06/15/2018   Procedure: ESOPHAGOGASTRODUODENOSCOPY (EGD) WITH PROPOFOL;  Surgeon: Beverley Fiedler, MD;  Location: [facility] ENDOSCOPY;  Service: Gastroenterology;  Laterality: N/A;   KNEE ARTHROSCOPY Left 10/2008   Meniscus Repair   KNEE ARTHROSCOPY W/ ACL RECONSTRUCTION Left 1998   LEFT HEART CATHETERIZATION WITH CORONARY ANGIOGRAM N/A 08/11/2013   Procedure: LEFT HEART CATHETERIZATION WITH CORONARY ANGIOGRAM;  Surgeon: Chrystie Nose, MD;  Location: [facility] CATH LAB;  Service: Cardiovascular;  Laterality: N/A;   LOWER EXTREMITY ANGIOGRAPHY Right 11/10/2020   Procedure: LOWER EXTREMITY ANGIOGRAPHY;  Surgeon: Annice Needy, MD;  Location: [facility] INVASIVE CV LAB;  Service: Cardiovascular;  Laterality: Right;   MENISCUS REPAIR Left 2001   TOTAL KNEE ARTHROPLASTY Left    UPPER GI ENDOSCOPY  10/2007   showed gastritis   Social History   Occupational History   Occupation: disability  Tobacco Use   Smoking status: Former    Current packs/day: 0.00    Average packs/day: 0.5 packs/day for 20.0 years (10.0 ttl pk-yrs)    Types: Cigarettes    Start  date: 03/23/1996    Quit date: 03/23/2016    Years since quitting: 7.6   Smokeless tobacco: Current    Types: Chew   Tobacco comments:     chews daily  Vaping Use   Vaping status: Never Used  Substance and Sexual Activity   Alcohol use: No    Comment: Recovered alcoholic   Drug use: No   Sexual activity: Not on file

## 2023-11-26 ENCOUNTER — Ambulatory Visit: Payer: Medicare HMO | Admitting: Orthopedic Surgery

## 2023-12-24 ENCOUNTER — Encounter: Payer: Self-pay | Admitting: Podiatry

## 2023-12-24 ENCOUNTER — Ambulatory Visit (INDEPENDENT_AMBULATORY_CARE_PROVIDER_SITE_OTHER): Payer: Medicare HMO | Admitting: Podiatry

## 2023-12-24 DIAGNOSIS — B351 Tinea unguium: Secondary | ICD-10-CM | POA: Diagnosis not present

## 2023-12-24 DIAGNOSIS — G4733 Obstructive sleep apnea (adult) (pediatric): Secondary | ICD-10-CM | POA: Diagnosis not present

## 2023-12-24 DIAGNOSIS — E1140 Type 2 diabetes mellitus with diabetic neuropathy, unspecified: Secondary | ICD-10-CM | POA: Diagnosis not present

## 2023-12-24 DIAGNOSIS — L03031 Cellulitis of right toe: Secondary | ICD-10-CM

## 2023-12-24 DIAGNOSIS — Q828 Other specified congenital malformations of skin: Secondary | ICD-10-CM | POA: Diagnosis not present

## 2023-12-24 DIAGNOSIS — Z794 Long term (current) use of insulin: Secondary | ICD-10-CM | POA: Diagnosis not present

## 2023-12-24 DIAGNOSIS — M79674 Pain in right toe(s): Secondary | ICD-10-CM | POA: Diagnosis not present

## 2023-12-24 DIAGNOSIS — M489 Spondylopathy, unspecified: Secondary | ICD-10-CM | POA: Diagnosis not present

## 2023-12-24 DIAGNOSIS — Z89421 Acquired absence of other right toe(s): Secondary | ICD-10-CM | POA: Diagnosis not present

## 2023-12-24 DIAGNOSIS — M79675 Pain in left toe(s): Secondary | ICD-10-CM | POA: Diagnosis not present

## 2023-12-24 DIAGNOSIS — E1142 Type 2 diabetes mellitus with diabetic polyneuropathy: Secondary | ICD-10-CM | POA: Diagnosis not present

## 2023-12-24 DIAGNOSIS — I2510 Atherosclerotic heart disease of native coronary artery without angina pectoris: Secondary | ICD-10-CM | POA: Diagnosis not present

## 2023-12-24 DIAGNOSIS — G629 Polyneuropathy, unspecified: Secondary | ICD-10-CM | POA: Diagnosis not present

## 2023-12-24 DIAGNOSIS — L02611 Cutaneous abscess of right foot: Secondary | ICD-10-CM

## 2023-12-24 DIAGNOSIS — I739 Peripheral vascular disease, unspecified: Secondary | ICD-10-CM | POA: Diagnosis not present

## 2023-12-24 DIAGNOSIS — Z87891 Personal history of nicotine dependence: Secondary | ICD-10-CM | POA: Diagnosis not present

## 2023-12-24 DIAGNOSIS — M25551 Pain in right hip: Secondary | ICD-10-CM | POA: Diagnosis not present

## 2023-12-24 MED ORDER — DOXYCYCLINE HYCLATE 100 MG PO CAPS: 100.0000 mg | ORAL_CAPSULE | Freq: Two times a day (BID) | ORAL | 0 refills | Status: AC

## 2023-12-27 NOTE — Progress Notes (Signed)
Subjective:  Patient ID: Grant [name], male    DOB: [birth year],  MRN: [id number]  Grant [name] presents to clinic today for at risk foot care with history of diabetic neuropathy and painful porokeratotic lesion(s) both feet and painful mycotic toenails that limit ambulation. Painful toenails interfere with ambulation. Aggravating factors include wearing enclosed shoe gear. Pain is relieved with periodic professional debridement. Painful porokeratotic lesions are aggravated when weightbearing with and without shoegear. Pain is relieved with periodic professional debridement.  Chief Complaint  Patient presents with   RFC    He is here for a nail and callous trim, PCP Dr. Jarold Motto ,   New problem(s): None.   PCP is Garlan Fillers, MD.  Allergies  Allergen Reactions   Codeine Nausea And Vomiting   Cymbalta [Duloxetine Hcl]     Other reaction(s): worsened depression   Duloxetine     Other reaction(s): Other (See Comments)   Glucophage [Metformin]     Other reaction(s): diarrhea   Lexapro [Escitalopram]     Other reaction(s): nausea   Metformin Hcl Diarrhea and Nausea Only   Ozempic (0.25 Or 0.5 Mg-Dose) [Semaglutide(0.25 Or 0.5mg -Dos)]     Other reaction(s): vomiting   Tylox [Oxycodone-Acetaminophen] Nausea And Vomiting   Oxycodone     Other Reaction(s): GI Intolerance    Review of Systems: Negative except as noted in the HPI.  Objective: No changes noted in today's physical examination. There were no vitals filed for this visit. Grant [name] is a pleasant 53 y.o. male WD, WN in NAD. AAO x 3.  Vascular Examination: Capillary refill time <3 seconds b/l LE. Palpable pedal pulses b/l LE. Digital hair present b/l. No pedal edema b/l. Skin temperature gradient WNL b/l. No varicosities b/l. Marland Kitchen  Dermatological Examination: Right great toe with erythema and incurvated nail plate medial border. No purulence, no drainage, no edema.  Pedal skin with normal turgor, texture and  tone b/l. No open wounds. No interdigital macerations b/l. Toenails 1-5 b/l thickened, discolored, dystrophic with subungual debris. There is pain on palpation to dorsal aspect of nailplates.   Hyperkeratotic lesion(s) submet head 5 left foot and sub 5th met base of both feet.  No erythema, no edema, no drainage, no fluctuance.  Neurological Examination: Protective sensation diminished with 10g monofilament b/l.  Musculoskeletal Examination: Muscle strength 5/5 to all lower extremity muscle groups bilaterally. Lower extremity amputation(s): 5th ray amputation right foot. Plantarflexed metatarsal(s) 5th metatarsal head left foot and 5th met base of both feet.  Assessment/Plan: 1. Pain due to onychomycosis of toenails of both feet   2. Cellulitis and abscess of toe of right foot   3. Porokeratosis   4. History of partial ray amputation of fifth toe of right foot (HCC)   5. Type 2 diabetes mellitus with peripheral neuropathy (HCC)     Meds ordered this encounter  Medications   doxycycline (VIBRAMYCIN) 100 MG capsule    Sig: Take 1 capsule (100 mg total) by mouth 2 (two) times daily for 10 days.    Dispense:  20 capsule    Refill:  0   -Patient was evaluated today. All questions/concerns addressed on today's visit. -Continue supportive shoe gear daily. -Mycotic toenails 1-4 bilaterally and left fifth digit were debrided in length and girth with sterile nail nippers and dremel without iatrogenic bleeding. -No invasive procedure(s) performed. Offending nail border debrided and curretaged R hallux utilizing sterile nail nipper and currette. Border(s) cleansed with alcohol and triple antibiotic ointment applied. Dispensed  written instructions for once daily epsom salt soaks for 7 days. Call office if there are any concerns. -Porokeratotic lesion(s) submet head 5 left foot and sub 5th met base b/l lower extremities pared and enucleated with sterile currette without incident. Total number of  lesions debrided=3. -Rx for Doxycyline 100 mg, #20, to be taken one capsule twice daily for 10 days. -Patient to follow up with Dr. Lilian Kapur in 7-10 days for follow up of right great toe cellulitis. We also discussed matrixectomy for chronic ingrown toenail. -Patient/POA to call should there be question/concern in the interim.   Return in about 9 weeks (around 02/25/2024).  Grant [name], DPM      [place] LOCATION: [address], [place] [address]                   Office [phone]   [facility] LOCATION: [address] [place], [place] [address] Office [phone]

## 2023-12-31 DIAGNOSIS — E1165 Type 2 diabetes mellitus with hyperglycemia: Secondary | ICD-10-CM | POA: Diagnosis not present

## 2024-01-02 ENCOUNTER — Ambulatory Visit: Payer: Medicare HMO | Admitting: Podiatry

## 2024-01-08 DIAGNOSIS — M25551 Pain in right hip: Secondary | ICD-10-CM | POA: Diagnosis not present

## 2024-01-18 DIAGNOSIS — E1165 Type 2 diabetes mellitus with hyperglycemia: Secondary | ICD-10-CM | POA: Diagnosis not present

## 2024-02-03 DIAGNOSIS — M25551 Pain in right hip: Secondary | ICD-10-CM | POA: Diagnosis not present

## 2024-02-10 DIAGNOSIS — M25551 Pain in right hip: Secondary | ICD-10-CM | POA: Diagnosis not present

## 2024-02-28 ENCOUNTER — Ambulatory Visit: Payer: Medicare HMO | Admitting: Podiatry

## 2024-02-28 DIAGNOSIS — Z89421 Acquired absence of other right toe(s): Secondary | ICD-10-CM | POA: Diagnosis not present

## 2024-02-28 DIAGNOSIS — M79675 Pain in left toe(s): Secondary | ICD-10-CM | POA: Diagnosis not present

## 2024-02-28 DIAGNOSIS — E1142 Type 2 diabetes mellitus with diabetic polyneuropathy: Secondary | ICD-10-CM

## 2024-02-28 DIAGNOSIS — L84 Corns and callosities: Secondary | ICD-10-CM | POA: Diagnosis not present

## 2024-02-28 DIAGNOSIS — M79674 Pain in right toe(s): Secondary | ICD-10-CM

## 2024-02-28 DIAGNOSIS — B351 Tinea unguium: Secondary | ICD-10-CM

## 2024-03-06 ENCOUNTER — Encounter: Payer: Self-pay | Admitting: Podiatry

## 2024-03-06 NOTE — Progress Notes (Signed)
 Subjective:  Patient ID: Grant [name], male    DOB: 25-Dec-1970,  MRN: [id number]  53 y.o. male presents at risk foot care. Patient has h/o NIDDM, neuropathy with amputation of 5th ray amputation right lower extremity and callus(es) of both feet and painful mycotic toenails that are difficult to trim. Painful toenails interfere with ambulation. Aggravating factors include wearing enclosed shoe gear. Pain is relieved with periodic professional debridement. Painful calluses are aggravated when weightbearing with and without shoegear. Pain is relieved with periodic professional debridement.  New problem(s): None   PCP is Garlan Fillers, MD , and last visit was May 06, 2023.  Allergies  Allergen Reactions   Codeine Nausea And Vomiting   Cymbalta [Duloxetine Hcl]     Other reaction(s): worsened depression   Duloxetine     Other reaction(s): Other (See Comments)   Glucophage [Metformin]     Other reaction(s): diarrhea   Lexapro [Escitalopram]     Other reaction(s): nausea   Metformin Hcl Diarrhea and Nausea Only   Ozempic (0.25 Or 0.5 Mg-Dose) [Semaglutide(0.25 Or 0.5mg -Dos)]     Other reaction(s): vomiting   Tylox [Oxycodone-Acetaminophen] Nausea And Vomiting   Oxycodone     Other Reaction(s): GI Intolerance    Review of Systems: Negative except as noted in the HPI.   Objective:  Grant [name] is a pleasant 53 y.o. male WD, WN in NAD. AAO x 3.  Vascular Examination: Vascular status intact b/l with palpable pedal pulses. CFT immediate b/l. Pedal hair present. No edema. No pain with calf compression b/l. Skin temperature gradient WNL b/l. No varicosities noted. No cyanosis or clubbing noted.  Neurological Examination: Protective sensation diminished with 10g monofilament b/l.   Dermatological Examination: Pedal skin with normal turgor, texture and tone b/l. No open wounds nor interdigital macerations noted. Toenails 1-5 left, 1-4 right thick, discolored, elongated with  subungual debris and pain on dorsal palpation.  Hyperkeratotic lesion(s) submet head 5 left foot and sub 5th met base b/l feet.  No erythema, no edema, no drainage, no fluctuance.  Musculoskeletal Examination: Muscle strength 5/5 to b/l LE.  No pain, crepitus noted b/l. Lower extremity amputation(s): 5th ray amputation right foot. Plantarflexed metatarsal(s) 5th metatarsal head left foot and 5th met base of both feet.  Radiographs: None  Last A1c:       No data to display         Assessment:   1. Pain due to onychomycosis of toenails of both feet   2. Callus   3. History of partial ray amputation of fifth toe of right foot (HCC)   4. Type 2 diabetes mellitus with peripheral neuropathy (HCC)    Plan:  -Patient was evaluated today. All questions/concerns addressed on today's visit. -Continue foot and shoe inspections daily. Monitor blood glucose per PCP/Endocrinologist's recommendations. -Toenails 1-5 left foot and 1-4 right foot debrided in length and girth without iatrogenic bleeding with sterile nail nipper and dremel.  -Callus(es) submet head 5 left foot and sub 5th met base b/l feet pared utilizing sterile scalpel blade without complication or incident. Total number debrided =3. -Patient/POA to call should there be question/concern in the interim.  Return in about 9 weeks (around 05/01/2024).  Freddie Breech, DPM      [place] LOCATION: [address]  [place], [place] [address]                   Office [phone]   [facility] LOCATION: [address] [place], [place] [address] Office [phone]

## 2024-03-23 DIAGNOSIS — I739 Peripheral vascular disease, unspecified: Secondary | ICD-10-CM | POA: Diagnosis not present

## 2024-03-23 DIAGNOSIS — E785 Hyperlipidemia, unspecified: Secondary | ICD-10-CM | POA: Diagnosis not present

## 2024-03-23 DIAGNOSIS — G4733 Obstructive sleep apnea (adult) (pediatric): Secondary | ICD-10-CM | POA: Diagnosis not present

## 2024-03-23 DIAGNOSIS — E1140 Type 2 diabetes mellitus with diabetic neuropathy, unspecified: Secondary | ICD-10-CM | POA: Diagnosis not present

## 2024-03-23 DIAGNOSIS — G629 Polyneuropathy, unspecified: Secondary | ICD-10-CM | POA: Diagnosis not present

## 2024-03-23 DIAGNOSIS — I2510 Atherosclerotic heart disease of native coronary artery without angina pectoris: Secondary | ICD-10-CM | POA: Diagnosis not present

## 2024-03-23 DIAGNOSIS — Z794 Long term (current) use of insulin: Secondary | ICD-10-CM | POA: Diagnosis not present

## 2024-03-26 DIAGNOSIS — E1165 Type 2 diabetes mellitus with hyperglycemia: Secondary | ICD-10-CM | POA: Diagnosis not present

## 2024-04-02 DIAGNOSIS — E7800 Pure hypercholesterolemia, unspecified: Secondary | ICD-10-CM | POA: Diagnosis not present

## 2024-04-02 DIAGNOSIS — I10 Essential (primary) hypertension: Secondary | ICD-10-CM | POA: Diagnosis not present

## 2024-04-02 DIAGNOSIS — I739 Peripheral vascular disease, unspecified: Secondary | ICD-10-CM | POA: Diagnosis not present

## 2024-04-02 DIAGNOSIS — E1140 Type 2 diabetes mellitus with diabetic neuropathy, unspecified: Secondary | ICD-10-CM | POA: Diagnosis not present

## 2024-04-02 DIAGNOSIS — E11621 Type 2 diabetes mellitus with foot ulcer: Secondary | ICD-10-CM | POA: Diagnosis not present

## 2024-04-02 DIAGNOSIS — E1165 Type 2 diabetes mellitus with hyperglycemia: Secondary | ICD-10-CM | POA: Diagnosis not present

## 2024-04-02 DIAGNOSIS — Z9641 Presence of insulin pump (external) (internal): Secondary | ICD-10-CM | POA: Diagnosis not present

## 2024-04-07 ENCOUNTER — Telehealth: Payer: Self-pay

## 2024-04-07 NOTE — Telephone Encounter (Signed)
 Called pt to cancel DSM appt pt stated he did not want a referral over to [facility]

## 2024-04-14 ENCOUNTER — Encounter (INDEPENDENT_AMBULATORY_CARE_PROVIDER_SITE_OTHER): Payer: Self-pay

## 2024-04-17 DIAGNOSIS — E1165 Type 2 diabetes mellitus with hyperglycemia: Secondary | ICD-10-CM | POA: Diagnosis not present

## 2024-04-22 ENCOUNTER — Other Ambulatory Visit

## 2024-05-08 ENCOUNTER — Ambulatory Visit: Admitting: Podiatry

## 2024-05-08 DIAGNOSIS — Z89421 Acquired absence of other right toe(s): Secondary | ICD-10-CM | POA: Diagnosis not present

## 2024-05-08 DIAGNOSIS — L84 Corns and callosities: Secondary | ICD-10-CM

## 2024-05-08 DIAGNOSIS — B351 Tinea unguium: Secondary | ICD-10-CM

## 2024-05-08 DIAGNOSIS — M79675 Pain in left toe(s): Secondary | ICD-10-CM

## 2024-05-08 DIAGNOSIS — E1142 Type 2 diabetes mellitus with diabetic polyneuropathy: Secondary | ICD-10-CM | POA: Diagnosis not present

## 2024-05-08 DIAGNOSIS — E119 Type 2 diabetes mellitus without complications: Secondary | ICD-10-CM

## 2024-05-08 DIAGNOSIS — M79674 Pain in right toe(s): Secondary | ICD-10-CM

## 2024-05-14 ENCOUNTER — Encounter: Payer: Self-pay | Admitting: Podiatry

## 2024-05-14 NOTE — Progress Notes (Signed)
 ANNUAL DIABETIC FOOT EXAM  Subjective: Grant [name] presents today for annual diabetic foot exam.  Patient confirms h/o diabetes.  Patient has h/o amputation(s): 5th ray amputation right foot.  Patient has been diagnosed with neuropathy.  Bertha Broad, MD is patient's PCP.  LOV 03/23/2024.  Past Medical History:  Diagnosis Date   Alcohol abuse    Anxiety    Arteritic AION (anterior ischemic optic neuropathy)    Chronic low back pain 11/30/2015   Chronic pain    Depression    Diabetic peripheral neuropathy (HCC) 11/30/2015   Gastric ulcer    Hypertension    Lumbar radiculopathy    Neuropathy    Sleep apnea    Type 2 diabetes mellitus (HCC)    Patient Active Problem List   Diagnosis Date Noted   Bladder outlet obstruction 09/17/2023   Obstructive sleep apnea 09/17/2023   Subclavian artery stenosis, right (HCC) 07/16/2023   Articular cartilage disorder of hip 03/06/2023   Cervical radicular pain 02/07/2023   Cervical facet joint syndrome 02/07/2023   Degenerative disc disease, cervical 02/07/2023   Chronic pain syndrome 02/07/2023   Chronic painful diabetic neuropathy (HCC) 02/07/2023   Asymmetric blood pressures 10/16/2022   PAD (peripheral artery disease) (HCC) 07/17/2022   Lumbar radiculopathy 07/21/2021   Osteomyelitis of fifth toe of right foot (HCC)    Alcohol use disorder, severe, dependence (HCC) 04/16/2019   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) 04/16/2019   Cannabis hyperemesis syndrome concurrent with and due to cannabis abuse (HCC) 08/01/2018   Gastroesophageal reflux disease 07/17/2018   Long QT interval 07/16/2018   Early satiety    Esophagitis, [place] grade D    Hypercalcemia 06/09/2018   SIRS (systemic inflammatory response syndrome) (HCC) 06/09/2018   Hematemesis 06/09/2018   Type 2 diabetes mellitus with peripheral neuropathy (HCC) 06/09/2018   Closed displaced fracture of first metatarsal  bone of left foot 07/31/2017   Acidosis 09/17/2016   Dehydration 09/17/2016   Abdominal pain 09/17/2016   DKA (diabetic ketoacidosis) (HCC) 09/17/2016   Nausea and vomiting 09/17/2016   IDDM (insulin  dependent diabetes mellitus)    Gastroparesis    Diabetic peripheral neuropathy (HCC) 11/30/2015   Chronic low back pain 11/30/2015   Coronary artery spasm (HCC) 08/17/2013   Hyperlipidemia 08/17/2013   Peripheral neuropathy 08/05/2013   Chest pain 08/05/2013   Fatigue 08/05/2013   Tachycardia 08/05/2013   DOE (dyspnea on exertion) 08/05/2013   Diabetes mellitus, insulin  dependent (IDDM), uncontrolled 02/09/2008   Anxiety state 02/09/2008   Essential hypertension 02/09/2008   ALCOHOL ABUSE, HX OF 02/09/2008   LIVER FUNCTION TESTS, ABNORMAL, HX OF 02/09/2008   NEOPLASM, BENIGN, ESOPHAGUS 09/16/2007   Gastritis 09/16/2007   Past Surgical History:  Procedure Laterality Date   AMPUTATION Right 12/07/2020   Procedure: RIGHT FOOT 5TH RAY AMPUTATION;  Surgeon: Timothy Ford, MD;  Location: [facility] OR;  Service: Orthopedics;  Laterality: Right;   back lipoma resection  03/2006   BIOPSY  06/15/2018   Procedure: BIOPSY;  Surgeon: Nannette Babe, MD;  Location: [facility] ENDOSCOPY;  Service: Gastroenterology;;   CARDIAC CATHETERIZATION     ESOPHAGOGASTRODUODENOSCOPY (EGD) WITH PROPOFOL  N/A 06/15/2018   Procedure: ESOPHAGOGASTRODUODENOSCOPY (EGD) WITH PROPOFOL ;  Surgeon: Nannette Babe, MD;  Location: [facility] ENDOSCOPY;  Service: Gastroenterology;  Laterality: N/A;   KNEE ARTHROSCOPY Left 10/2008   Meniscus Repair   KNEE ARTHROSCOPY W/ ACL RECONSTRUCTION Left 1998   LEFT HEART CATHETERIZATION WITH CORONARY ANGIOGRAM N/A  08/11/2013   Procedure: LEFT HEART CATHETERIZATION WITH CORONARY ANGIOGRAM;  Surgeon: Hazle Lites, MD;  Location: [facility] CATH LAB;  Service: Cardiovascular;  Laterality: N/A;   LOWER EXTREMITY ANGIOGRAPHY Right 11/10/2020   Procedure: LOWER EXTREMITY ANGIOGRAPHY;  Surgeon: Celso College, MD;   Location: [facility] INVASIVE CV LAB;  Service: Cardiovascular;  Laterality: Right;   MENISCUS REPAIR Left 2001   TOTAL KNEE ARTHROPLASTY Left    UPPER GI ENDOSCOPY  10/2007   showed gastritis   Current Outpatient Medications on File Prior to Visit  Medication Sig Dispense Refill   aspirin  EC 81 MG tablet Take 1 tablet PO daily 150 tablet 2   atorvastatin  (LIPITOR) 10 MG tablet      diazepam  (VALIUM ) 5 MG tablet 1 po 30 minutes prior to MRI scan. May repeat x 1 right before MRI scan if needed. You will need a driver. 2 tablet 0   insulin  aspart (NOVOLOG ) 100 UNIT/ML injection Insulin  pump     Insulin  Human (INSULIN  PUMP) SOLN Inject into the skin as directed.     losartan-hydrochlorothiazide (HYZAAR) 100-12.5 MG tablet Take 1 tablet by mouth at bedtime.     metoprolol  succinate (TOPROL -XL) 50 MG 24 hr tablet Take 50 mg by mouth at bedtime.     nitroGLYCERIN  (NITRODUR - DOSED IN MG/24 HR) 0.2 mg/hr patch Place 1 patch (0.2 mg total) onto the skin daily. Cut patch into fourths. Apply 1/4 patch to affected area on elbow. 30 patch 1   pregabalin (LYRICA) 200 MG capsule Take 200 mg by mouth 3 (three) times daily.     traZODone  (DESYREL ) 100 MG tablet Take 100 mg by mouth at bedtime.     zolpidem  (AMBIEN ) 10 MG tablet Take 10 mg by mouth at bedtime.     No current facility-administered medications on file prior to visit.    Allergies  Allergen Reactions   Codeine Nausea And Vomiting   Cymbalta [Duloxetine Hcl]     Other reaction(s): worsened depression   Duloxetine     Other reaction(s): Other (See Comments)   Glucophage [Metformin]     Other reaction(s): diarrhea   Lexapro [Escitalopram]     Other reaction(s): nausea   Metformin Hcl Diarrhea and Nausea Only   Ozempic (0.25 Or 0.5 Mg-Dose) [Semaglutide(0.25 Or 0.5mg -Dos)]     Other reaction(s): vomiting   Tylox [Oxycodone -Acetaminophen ] Nausea And Vomiting   Oxycodone      Other Reaction(s): GI Intolerance   Social History   Occupational  History   Occupation: disability  Tobacco Use   Smoking status: Former    Current packs/day: 0.00    Average packs/day: 0.5 packs/day for 20.0 years (10.0 ttl pk-yrs)    Types: Cigarettes    Start date: 03/23/1996    Quit date: 03/23/2016    Years since quitting: 8.1   Smokeless tobacco: Current    Types: Chew   Tobacco comments:     chews daily  Vaping Use   Vaping status: Never Used  Substance and Sexual Activity   Alcohol use: No    Comment: Recovered alcoholic   Drug use: No   Sexual activity: Not on file   Family History  Problem Relation Age of Onset   Diabetes Mother    Hypertension Mother    Esophageal cancer Father    Hypertension Father    Hyperlipidemia Father    Throat cancer Father    Alcohol abuse Brother    Brain cancer Maternal Grandfather    Stomach cancer Paternal  Grandfather    Colon cancer Neg Hx    Rectal cancer Neg Hx    Immunization History  Administered Date(s) Administered   PFIZER(Purple Top)SARS-COV-2 Vaccination 02/29/2020, 03/23/2020     Review of Systems: Negative except as noted in the HPI.   Objective: There were no vitals filed for this visit.  Grant [name] is a pleasant 53 y.o. male in NAD. AAO X 3.  Diabetic foot exam was performed with the following findings:   Intact posterior tibialis and dorsalis pedis pulses Vascular Examination: Capillary refill time immediate b/l. Palpable pedal pulses. Pedal hair present b/l. No pain with calf compression b/l. Skin temperature gradient WNL b/l. No cyanosis or clubbing b/l. No ischemia or gangrene noted b/l.   Neurological Examination: Protective sensation diminished with 10g monofilament b/l.  Dermatological Examination: Pedal skin with normal turgor, texture and tone b/l.  No open wounds. No interdigital macerations.   Toenails 1-4 bilaterally and L 5th toe elongated, discolored, dystrophic, thickened, and crumbly with subungual debris and tenderness to dorsal palpation.  Hyperkeratotic lesion(s) submet head 5 left foot, sub 5th met base left foot, and sub 5th met base right foot.  No erythema, no edema, no drainage, no fluctuance.  Musculoskeletal Examination: Normal muscle strength 5/5 to all lower extremity muscle groups bilaterally. Lower extremity amputation(s): 5th ray amputation right foot. Plantarflexed metatarsal(s) 5th metatarsal head left lower extremity and 5th met base b/l lower extremities.. No pain, crepitus or joint limitation noted with ROM b/l LE.  Patient ambulates independently without assistive aids.  Radiographs: None     Lab Results  Component Value Date   HGBA1C 9.4 (H) 06/09/2018   ADA Risk Categorization: High Risk  Patient has one or more of the following: Loss of protective sensation Absent pedal pulses Severe Foot deformity History of foot ulcer  Assessment: 1. Pain due to onychomycosis of toenails of both feet   2. Callus   3. History of partial ray amputation of fifth toe of right foot (HCC)   4. Type 2 diabetes mellitus with peripheral neuropathy (HCC)   5. Encounter for diabetic foot exam [facility])     Plan: -Patient was evaluated today. All questions/concerns addressed on today's visit. -Diabetic foot examination performed today. -Continue diabetic foot care principles: inspect feet daily, monitor glucose as recommended by PCP and/or Endocrinologist, and follow prescribed diet per PCP, Endocrinologist and/or dietician. -Patient to continue soft, supportive shoe gear daily. -Mycotic toenails 1-4 bilaterally and L 5th toe were debrided in length and girth with sterile nail nippers and dremel without iatrogenic bleeding. -Callus(es) submet head 5 left foot, sub 5th met base left foot, and sub 5th met base right foot pared utilizing sterile scalpel blade without complication or incident. Total number debrided =3. -Patient/POA to call should there be question/concern in the interim. Return in about 9 weeks (around  07/10/2024).  Grant [name], DPM      [place] LOCATION: [address], [place] [address]                   Office [phone]   [facility] LOCATION: [address] [place], [place] [address] Office [phone]

## 2024-06-01 DIAGNOSIS — E785 Hyperlipidemia, unspecified: Secondary | ICD-10-CM | POA: Diagnosis not present

## 2024-06-01 DIAGNOSIS — E1140 Type 2 diabetes mellitus with diabetic neuropathy, unspecified: Secondary | ICD-10-CM | POA: Diagnosis not present

## 2024-06-01 DIAGNOSIS — Z794 Long term (current) use of insulin: Secondary | ICD-10-CM | POA: Diagnosis not present

## 2024-06-01 DIAGNOSIS — I2510 Atherosclerotic heart disease of native coronary artery without angina pectoris: Secondary | ICD-10-CM | POA: Diagnosis not present

## 2024-06-01 DIAGNOSIS — Z125 Encounter for screening for malignant neoplasm of prostate: Secondary | ICD-10-CM | POA: Diagnosis not present

## 2024-06-08 DIAGNOSIS — G4733 Obstructive sleep apnea (adult) (pediatric): Secondary | ICD-10-CM | POA: Diagnosis not present

## 2024-06-08 DIAGNOSIS — M489 Spondylopathy, unspecified: Secondary | ICD-10-CM | POA: Diagnosis not present

## 2024-06-08 DIAGNOSIS — Z1331 Encounter for screening for depression: Secondary | ICD-10-CM | POA: Diagnosis not present

## 2024-06-08 DIAGNOSIS — L989 Disorder of the skin and subcutaneous tissue, unspecified: Secondary | ICD-10-CM | POA: Diagnosis not present

## 2024-06-08 DIAGNOSIS — Z0000 Encounter for general adult medical examination without abnormal findings: Secondary | ICD-10-CM | POA: Diagnosis not present

## 2024-06-08 DIAGNOSIS — G629 Polyneuropathy, unspecified: Secondary | ICD-10-CM | POA: Diagnosis not present

## 2024-06-08 DIAGNOSIS — Z1339 Encounter for screening examination for other mental health and behavioral disorders: Secondary | ICD-10-CM | POA: Diagnosis not present

## 2024-06-08 DIAGNOSIS — I10 Essential (primary) hypertension: Secondary | ICD-10-CM | POA: Diagnosis not present

## 2024-06-08 DIAGNOSIS — R82998 Other abnormal findings in urine: Secondary | ICD-10-CM | POA: Diagnosis not present

## 2024-06-08 DIAGNOSIS — E785 Hyperlipidemia, unspecified: Secondary | ICD-10-CM | POA: Diagnosis not present

## 2024-06-08 DIAGNOSIS — E1140 Type 2 diabetes mellitus with diabetic neuropathy, unspecified: Secondary | ICD-10-CM | POA: Diagnosis not present

## 2024-06-08 DIAGNOSIS — E119 Type 2 diabetes mellitus without complications: Secondary | ICD-10-CM | POA: Diagnosis not present

## 2024-06-15 DIAGNOSIS — E1165 Type 2 diabetes mellitus with hyperglycemia: Secondary | ICD-10-CM | POA: Diagnosis not present

## 2024-06-16 DIAGNOSIS — L570 Actinic keratosis: Secondary | ICD-10-CM | POA: Diagnosis not present

## 2024-06-16 DIAGNOSIS — D225 Melanocytic nevi of trunk: Secondary | ICD-10-CM | POA: Diagnosis not present

## 2024-06-25 DIAGNOSIS — E1140 Type 2 diabetes mellitus with diabetic neuropathy, unspecified: Secondary | ICD-10-CM | POA: Diagnosis not present

## 2024-06-25 DIAGNOSIS — E11621 Type 2 diabetes mellitus with foot ulcer: Secondary | ICD-10-CM | POA: Diagnosis not present

## 2024-06-25 DIAGNOSIS — E7800 Pure hypercholesterolemia, unspecified: Secondary | ICD-10-CM | POA: Diagnosis not present

## 2024-06-25 DIAGNOSIS — I739 Peripheral vascular disease, unspecified: Secondary | ICD-10-CM | POA: Diagnosis not present

## 2024-06-25 DIAGNOSIS — I10 Essential (primary) hypertension: Secondary | ICD-10-CM | POA: Diagnosis not present

## 2024-06-25 DIAGNOSIS — E1165 Type 2 diabetes mellitus with hyperglycemia: Secondary | ICD-10-CM | POA: Diagnosis not present

## 2024-06-25 DIAGNOSIS — Z9641 Presence of insulin pump (external) (internal): Secondary | ICD-10-CM | POA: Diagnosis not present

## 2024-07-13 ENCOUNTER — Encounter: Payer: Self-pay | Admitting: Podiatry

## 2024-07-13 ENCOUNTER — Ambulatory Visit: Admitting: Podiatry

## 2024-07-13 DIAGNOSIS — Z89421 Acquired absence of other right toe(s): Secondary | ICD-10-CM

## 2024-07-13 DIAGNOSIS — E1142 Type 2 diabetes mellitus with diabetic polyneuropathy: Secondary | ICD-10-CM | POA: Diagnosis not present

## 2024-07-13 DIAGNOSIS — M79675 Pain in left toe(s): Secondary | ICD-10-CM

## 2024-07-13 DIAGNOSIS — B351 Tinea unguium: Secondary | ICD-10-CM

## 2024-07-13 DIAGNOSIS — L84 Corns and callosities: Secondary | ICD-10-CM

## 2024-07-13 DIAGNOSIS — M79674 Pain in right toe(s): Secondary | ICD-10-CM

## 2024-07-16 ENCOUNTER — Other Ambulatory Visit (INDEPENDENT_AMBULATORY_CARE_PROVIDER_SITE_OTHER): Payer: Self-pay | Admitting: Vascular Surgery

## 2024-07-16 DIAGNOSIS — I771 Stricture of artery: Secondary | ICD-10-CM

## 2024-07-16 DIAGNOSIS — I739 Peripheral vascular disease, unspecified: Secondary | ICD-10-CM

## 2024-07-16 DIAGNOSIS — E1165 Type 2 diabetes mellitus with hyperglycemia: Secondary | ICD-10-CM | POA: Diagnosis not present

## 2024-07-18 NOTE — Progress Notes (Signed)
 Subjective:  Patient ID: Grant [name], male    DOB: 1971/08/13,  MRN: [id number]  Grant [name] presents to clinic today for at risk foot care. Patient has h/o NIDDM, neuropathy with amputation of partial 5th ray amputation right foot and callus(es) of both feet and painful thick toenails that are difficult to trim. Painful toenails interfere with ambulation. Aggravating factors include wearing enclosed shoe gear. Pain is relieved with periodic professional debridement. Painful calluses are aggravated when weightbearing with and without shoegear. Pain is relieved with periodic professional debridement.  Chief Complaint  Patient presents with   Diabetes    DFC IDDM A1C 7.3 Toenail trim. LOV with PCP 06/11/24.   New problem(s): None.   PCP is Yolande Toribio MATSU, MD.  Allergies  Allergen Reactions   Codeine Nausea And Vomiting   Cymbalta [Duloxetine Hcl]     Other reaction(s): worsened depression   Duloxetine     Other reaction(s): Other (See Comments)   Glucophage [Metformin]     Other reaction(s): diarrhea   Lexapro [Escitalopram]     Other reaction(s): nausea   Metformin Hcl Diarrhea and Nausea Only   Ozempic (0.25 Or 0.5 Mg-Dose) [Semaglutide(0.25 Or 0.5mg -Dos)]     Other reaction(s): vomiting   Tylox [Oxycodone -Acetaminophen ] Nausea And Vomiting   Oxycodone      Other Reaction(s): GI Intolerance    Review of Systems: Negative except as noted in the HPI.  Objective: No changes noted in today's physical examination. There were no vitals filed for this visit. Grant [name] is a pleasant 53 y.o. male in NAD. AAO x 3.  Intact posterior tibialis and dorsalis pedis pulses Vascular Examination: Capillary refill time immediate b/l. Palpable pedal pulses. Pedal hair present b/l. No pain with calf compression b/l. Skin temperature gradient WNL b/l. No cyanosis or clubbing b/l. No ischemia or gangrene noted b/l.   Neurological Examination: Protective sensation diminished with 10g  monofilament b/l.  Dermatological Examination: Pedal skin with normal turgor, texture and tone b/l.  No open wounds. No interdigital macerations.   Toenails 1-4 bilaterally and L 5th toe elongated, discolored, dystrophic, thickened, and crumbly with subungual debris and tenderness to dorsal palpation. Hyperkeratotic lesion(s) submet head 5 left foot, sub 5th met base left foot, and sub 5th met base right foot.  No erythema, no edema, no drainage, no fluctuance.  Musculoskeletal Examination: Normal muscle strength 5/5 to all lower extremity muscle groups bilaterally. Lower extremity amputation(s): 5th ray amputation right foot. Plantarflexed metatarsal(s) 5th metatarsal head left lower extremity and 5th met base b/l lower extremities.. No pain, crepitus or joint limitation noted with ROM b/l LE.  Patient ambulates independently without assistive aids.  Radiographs: None  Assessment/Plan: 1. Pain due to onychomycosis of toenails of both feet   2. Callus   3. History of partial ray amputation of fifth toe of right foot (HCC)   4. Type 2 diabetes mellitus with peripheral neuropathy [facility])    Consent given for treatment. Patient examined.All patient's and/or POA's questions/concerns addressed on today's visit. Toenails 1-5 debrided in length and girth without incident. Callus(es) submet head 5 left foot and sub 5th met base b/l lower extremities pared with sharp debridement without incident. Treatment was provided by assistant Andrez Manchester under my supervision. Continue foot and shoe inspections daily. Monitor blood glucose per PCP/Endocrinologist's recommendations.Continue soft, supportive shoe gear daily. Report any pedal injuries to medical professional. Call office if there are any questions/concerns.  Return in about 9 weeks (around 09/14/2024).  Grant [name],  DPM      [place] LOCATION: [address], [place] [address]                    Office [phone]   [facility] LOCATION: [address] [place], [place] [address] Office [phone]

## 2024-07-24 ENCOUNTER — Ambulatory Visit (INDEPENDENT_AMBULATORY_CARE_PROVIDER_SITE_OTHER): Payer: Medicare HMO | Admitting: Vascular Surgery

## 2024-07-24 ENCOUNTER — Encounter (INDEPENDENT_AMBULATORY_CARE_PROVIDER_SITE_OTHER): Payer: Self-pay | Admitting: Vascular Surgery

## 2024-07-24 ENCOUNTER — Ambulatory Visit (INDEPENDENT_AMBULATORY_CARE_PROVIDER_SITE_OTHER): Payer: Medicare HMO

## 2024-07-24 VITALS — BP 131/83 | HR 73 | Ht 71.5 in | Wt 248.0 lb

## 2024-07-24 DIAGNOSIS — I771 Stricture of artery: Secondary | ICD-10-CM

## 2024-07-24 DIAGNOSIS — I739 Peripheral vascular disease, unspecified: Secondary | ICD-10-CM

## 2024-07-24 DIAGNOSIS — I6523 Occlusion and stenosis of bilateral carotid arteries: Secondary | ICD-10-CM

## 2024-07-24 DIAGNOSIS — I10 Essential (primary) hypertension: Secondary | ICD-10-CM

## 2024-07-24 DIAGNOSIS — E785 Hyperlipidemia, unspecified: Secondary | ICD-10-CM

## 2024-07-24 DIAGNOSIS — E1142 Type 2 diabetes mellitus with diabetic polyneuropathy: Secondary | ICD-10-CM

## 2024-07-24 NOTE — Progress Notes (Signed)
 MRN : [id number]  Grant [name] is a 53 y.o. ([birth year]) male who presents with chief complaint of  Chief Complaint  Patient presents with   Follow-up    1 year follow up having numbness in chest and arm on left side   .  History of Present Illness: Patient returns today in follow up of vascular issues.  Couple of years ago, he had right iliac intervention and has done well from this.  No current lifestyle limiting claudication, ischemic rest pain, or ulceration.  ABIs today are normal at 1.14 on the right and 1.17 on the left with triphasic waveforms and normal digital pressures. He is also followed for carotid and subclavian artery disease.  He has not had any focal neurologic symptoms since his last visit Specifically, the patient denies amaurosis fugax, speech or swallowing difficulties, or arm or leg weakness or numbness.  He does have some arm weakness with activity but nothing that is disabling.  No severe vertebrobasilar symptoms. Carotid duplex today shows velocities just into the 40 to 59% range bilaterally.  These have not markedly changed from the velocities a year ago.  The right subclavian artery has abnormal flow consistent with stenosis.  Both vertebral arteries continue to be antegrade.  The left subclavian artery appears normal.  Current Outpatient Medications  Medication Sig Dispense Refill   aspirin  EC 81 MG tablet Take 1 tablet PO daily 150 tablet 2   atorvastatin  (LIPITOR) 10 MG tablet      insulin  aspart (NOVOLOG ) 100 UNIT/ML injection Insulin  pump     Insulin  Human (INSULIN  PUMP) SOLN Inject into the skin as directed.     losartan-hydrochlorothiazide (HYZAAR) 100-12.5 MG tablet Take 1 tablet by mouth at bedtime.     metoprolol  succinate (TOPROL -XL) 50 MG 24 hr tablet Take 50 mg by mouth at bedtime.     nitroGLYCERIN  (NITRODUR - DOSED IN MG/24 HR) 0.2 mg/hr patch Place 1 patch (0.2 mg total) onto the skin daily. Cut patch into fourths. Apply 1/4 patch to affected area  on elbow. 30 patch 1   pregabalin (LYRICA) 200 MG capsule Take 200 mg by mouth 3 (three) times daily.     traZODone  (DESYREL ) 100 MG tablet Take 100 mg by mouth at bedtime.     zolpidem  (AMBIEN ) 10 MG tablet Take 10 mg by mouth at bedtime.     diazepam  (VALIUM ) 5 MG tablet 1 po 30 minutes prior to MRI scan. May repeat x 1 right before MRI scan if needed. You will need a driver. (Patient not taking: Reported on 07/24/2024) 2 tablet 0   No current facility-administered medications for this visit.    Past Medical History:  Diagnosis Date   Alcohol abuse    Anxiety    Arteritic AION (anterior ischemic optic neuropathy)    Chronic low back pain 11/30/2015   Chronic pain    Depression    Diabetic peripheral neuropathy (HCC) 11/30/2015   Gastric ulcer    Hypertension    Lumbar radiculopathy    Neuropathy    Sleep apnea    Type 2 diabetes mellitus (HCC)     Past Surgical History:  Procedure Laterality Date   AMPUTATION Right 12/07/2020   Procedure: RIGHT FOOT 5TH RAY AMPUTATION;  Surgeon: Harden Jerona GAILS, MD;  Location: [facility] OR;  Service: Orthopedics;  Laterality: Right;   back lipoma resection  03/2006   BIOPSY  06/15/2018   Procedure: BIOPSY;  Surgeon: Albertus Gordy HERO, MD;  Location: [facility]  ENDOSCOPY;  Service: Gastroenterology;;   CARDIAC CATHETERIZATION     ESOPHAGOGASTRODUODENOSCOPY (EGD) WITH PROPOFOL  N/A 06/15/2018   Procedure: ESOPHAGOGASTRODUODENOSCOPY (EGD) WITH PROPOFOL ;  Surgeon: Albertus Gordy HERO, MD;  Location: [facility] ENDOSCOPY;  Service: Gastroenterology;  Laterality: N/A;   KNEE ARTHROSCOPY Left 10/2008   Meniscus Repair   KNEE ARTHROSCOPY W/ ACL RECONSTRUCTION Left 1998   LEFT HEART CATHETERIZATION WITH CORONARY ANGIOGRAM N/A 08/11/2013   Procedure: LEFT HEART CATHETERIZATION WITH CORONARY ANGIOGRAM;  Surgeon: Vinie KYM Maxcy, MD;  Location: [facility] CATH LAB;  Service: Cardiovascular;  Laterality: N/A;   LOWER EXTREMITY ANGIOGRAPHY Right 11/10/2020   Procedure: LOWER EXTREMITY  ANGIOGRAPHY;  Surgeon: Marea Selinda RAMAN, MD;  Location: [facility] INVASIVE CV LAB;  Service: Cardiovascular;  Laterality: Right;   MENISCUS REPAIR Left 2001   TOTAL KNEE ARTHROPLASTY Left    UPPER GI ENDOSCOPY  10/2007   showed gastritis     Social History   Tobacco Use   Smoking status: Former    Current packs/day: 0.00    Average packs/day: 0.5 packs/day for 20.0 years (10.0 ttl pk-yrs)    Types: Cigarettes    Start date: 03/23/1996    Quit date: 03/23/2016    Years since quitting: 8.3   Smokeless tobacco: Current    Types: Chew   Tobacco comments:     chews daily  Vaping Use   Vaping status: Never Used  Substance Use Topics   Alcohol use: No    Comment: Recovered alcoholic   Drug use: No      Family History  Problem Relation Age of Onset   Diabetes Mother    Hypertension Mother    Esophageal cancer Father    Hypertension Father    Hyperlipidemia Father    Throat cancer Father    Alcohol abuse Brother    Brain cancer Maternal Grandfather    Stomach cancer Paternal Grandfather    Colon cancer Neg Hx    Rectal cancer Neg Hx      Allergies  Allergen Reactions   Codeine Nausea And Vomiting   Cymbalta [Duloxetine Hcl]     Other reaction(s): worsened depression   Duloxetine     Other reaction(s): Other (See Comments)   Glucophage [Metformin]     Other reaction(s): diarrhea   Lexapro [Escitalopram]     Other reaction(s): nausea   Metformin Hcl Diarrhea and Nausea Only   Ozempic (0.25 Or 0.5 Mg-Dose) [Semaglutide(0.25 Or 0.5mg -Dos)]     Other reaction(s): vomiting   Tylox [Oxycodone -Acetaminophen ] Nausea And Vomiting   Oxycodone      Other Reaction(s): GI Intolerance    REVIEW OF SYSTEMS (Negative unless checked)   Constitutional: [x] Weight loss  [] Fever  [] Chills Cardiac: [] Chest pain   [] Chest pressure   [] Palpitations   [] Shortness of breath when laying flat   [] Shortness of breath at rest   [] Shortness of breath with exertion. Vascular:  [] Pain in legs with  walking   [] Pain in legs at rest   [] Pain in legs when laying flat   [] Claudication   [] Pain in feet when walking  [] Pain in feet at rest  [] Pain in feet when laying flat   [] History of DVT   [] Phlebitis   [] Swelling in legs   [] Varicose veins   [] Non-healing ulcers Pulmonary:   [] Uses home oxygen   [] Productive cough   [] Hemoptysis   [] Wheeze  [] COPD   [] Asthma Neurologic:  [] Dizziness  [] Blackouts   [] Seizures   [] History of stroke   [] History of TIA  [] Aphasia   []   Temporary blindness   [] Dysphagia   [x] Weakness or numbness in arms   [x] Weakness or numbness in legs Musculoskeletal:  [] Arthritis   [] Joint swelling   [] Joint pain   [] Low back pain Hematologic:  [] Easy bruising  [] Easy bleeding   [] Hypercoagulable state   [] Anemic   Gastrointestinal:  [] Blood in stool   [] Vomiting blood  [] Gastroesophageal reflux/heartburn   [] Abdominal pain Genitourinary:  [] Chronic kidney disease   [] Difficult urination  [] Frequent urination  [] Burning with urination   [] Hematuria Skin:  [] Rashes   [] Ulcers   [] Wounds Psychological:  [] History of anxiety   []  History of major depression.  Physical Examination  BP 131/83   Pulse 73   Ht 5' 11.5 (1.816 m)   Wt 248 lb (112.5 kg)   BMI 34.11 kg/m  Gen:  WD/WN, NAD Head: [place]/AT, No temporalis wasting. Ear/Nose/Throat: Hearing grossly intact, nares w/o erythema or drainage Eyes: Conjunctiva clear. Sclera non-icteric Neck: Supple.  Trachea midline Pulmonary:  Good air movement, no use of accessory muscles.  Cardiac: RRR, no JVD Vascular:  Vessel Right Left  Radial Trace Palpable Palpable                          PT Palpable Palpable  DP Palpable Palpable   Gastrointestinal: soft, non-tender/non-distended. No guarding/reflex.  Musculoskeletal: M/S 5/5 throughout.  No deformity or atrophy. No edema. Neurologic: Sensation grossly intact in extremities.  Symmetrical.  Speech is fluent.  Psychiatric: Judgment intact, Mood & affect appropriate for pt's  clinical situation. Dermatologic: No rashes or ulcers noted.  No cellulitis or open wounds.      Labs No results found for this or any previous visit (from the past 2160 hours).  Radiology No results found.  Assessment/Plan  PAD (peripheral artery disease) (HCC) ABIs today are normal at 1.14 on the right and 1.17 on the left with triphasic waveforms and normal digital pressures.  Continue aspirin  and Lipitor.  Recheck in 1 year.  Subclavian artery stenosis, right (HCC) Carotid duplex today shows velocities just into the 40 to 59% range bilaterally.  These have not markedly changed from the velocities a year ago.  The right subclavian artery has abnormal flow consistent with stenosis.  Both vertebral arteries continue to be antegrade.  The left subclavian artery appears normal.  He does not really have any obvious disabling symptoms from his right subclavian artery stenosis.  His right arm blood pressure is some 50 points lower than the left, so his left arm should be used for his real blood pressure for monitoring.  Should he develop worsening symptoms, we can certainly consider angiogram with intervention but at this time he is not interested and does not have enough symptoms to warrant this.  Plan to recheck in 1 year.  Carotid stenosis Carotid duplex today shows velocities just into the 40 to 59% range bilaterally.  These have not markedly changed from the velocities a year ago.  The right subclavian artery has abnormal flow consistent with stenosis.  Both vertebral arteries continue to be antegrade.  The left subclavian artery appears normal.  No role for intervention.  Recheck in 1 year.  Continue aspirin  and statin agent.  Type 2 diabetes mellitus with peripheral neuropathy (HCC) blood glucose control important in reducing the progression of atherosclerotic disease. Also, involved in wound healing. On appropriate medications.     Hyperlipidemia lipid control important in reducing  the progression of atherosclerotic disease. Continue statin therapy   Essential  hypertension blood pressure control important in reducing the progression of atherosclerotic disease. On appropriate oral medications.  Selinda Gu, MD  07/27/2024 9:26 AM    This note was created with Dragon medical transcription system.  Any errors from dictation are purely unintentional

## 2024-07-27 DIAGNOSIS — I6529 Occlusion and stenosis of unspecified carotid artery: Secondary | ICD-10-CM | POA: Insufficient documentation

## 2024-07-27 NOTE — Assessment & Plan Note (Signed)
 Carotid duplex today shows velocities just into the 40 to 59% range bilaterally.  These have not markedly changed from the velocities a year ago.  The right subclavian artery has abnormal flow consistent with stenosis.  Both vertebral arteries continue to be antegrade.  The left subclavian artery appears normal.  No role for intervention.  Recheck in 1 year.  Continue aspirin  and statin agent.

## 2024-07-27 NOTE — Assessment & Plan Note (Signed)
 ABIs today are normal at 1.14 on the right and 1.17 on the left with triphasic waveforms and normal digital pressures.  Continue aspirin  and Lipitor.  Recheck in 1 year.

## 2024-07-27 NOTE — Assessment & Plan Note (Signed)
 Carotid duplex today shows velocities just into the 40 to 59% range bilaterally.  These have not markedly changed from the velocities a year ago.  The right subclavian artery has abnormal flow consistent with stenosis.  Both vertebral arteries continue to be antegrade.  The left subclavian artery appears normal.  He does not really have any obvious disabling symptoms from his right subclavian artery stenosis.  His right arm blood pressure is some 50 points lower than the left, so his left arm should be used for his real blood pressure for monitoring.  Should he develop worsening symptoms, we can certainly consider angiogram with intervention but at this time he is not interested and does not have enough symptoms to warrant this.  Plan to recheck in 1 year.

## 2024-07-28 LAB — VAS US ABI WITH/WO TBI
Left ABI: 1.17
Right ABI: 1.14

## 2024-09-01 DIAGNOSIS — H47011 Ischemic optic neuropathy, right eye: Secondary | ICD-10-CM | POA: Diagnosis not present

## 2024-09-01 DIAGNOSIS — E113299 Type 2 diabetes mellitus with mild nonproliferative diabetic retinopathy without macular edema, unspecified eye: Secondary | ICD-10-CM | POA: Diagnosis not present

## 2024-09-01 DIAGNOSIS — G932 Benign intracranial hypertension: Secondary | ICD-10-CM | POA: Diagnosis not present

## 2024-09-07 DIAGNOSIS — G4733 Obstructive sleep apnea (adult) (pediatric): Secondary | ICD-10-CM | POA: Diagnosis not present

## 2024-09-07 DIAGNOSIS — J3489 Other specified disorders of nose and nasal sinuses: Secondary | ICD-10-CM | POA: Diagnosis not present

## 2024-09-07 DIAGNOSIS — Z23 Encounter for immunization: Secondary | ICD-10-CM | POA: Diagnosis not present

## 2024-09-07 DIAGNOSIS — E1140 Type 2 diabetes mellitus with diabetic neuropathy, unspecified: Secondary | ICD-10-CM | POA: Diagnosis not present

## 2024-09-07 DIAGNOSIS — I10 Essential (primary) hypertension: Secondary | ICD-10-CM | POA: Diagnosis not present

## 2024-09-07 DIAGNOSIS — G629 Polyneuropathy, unspecified: Secondary | ICD-10-CM | POA: Diagnosis not present

## 2024-09-14 ENCOUNTER — Ambulatory Visit: Admitting: Podiatry

## 2024-09-14 DIAGNOSIS — L84 Corns and callosities: Secondary | ICD-10-CM

## 2024-09-14 DIAGNOSIS — M79675 Pain in left toe(s): Secondary | ICD-10-CM | POA: Diagnosis not present

## 2024-09-14 DIAGNOSIS — B351 Tinea unguium: Secondary | ICD-10-CM | POA: Diagnosis not present

## 2024-09-14 DIAGNOSIS — E1142 Type 2 diabetes mellitus with diabetic polyneuropathy: Secondary | ICD-10-CM | POA: Diagnosis not present

## 2024-09-14 DIAGNOSIS — Z89421 Acquired absence of other right toe(s): Secondary | ICD-10-CM | POA: Diagnosis not present

## 2024-09-14 DIAGNOSIS — M79674 Pain in right toe(s): Secondary | ICD-10-CM

## 2024-09-20 ENCOUNTER — Encounter: Payer: Self-pay | Admitting: Podiatry

## 2024-09-20 NOTE — Progress Notes (Addendum)
 Subjective:  Patient ID: Grant [name], male    DOB: 01/22/1971,  MRN: [id number]  Grant [name] presents to clinic today for at risk foot care. Patient has h/o NIDDM, neuropathy with amputation of partial 5th ray amputation right lower extremity and callus(es) b/l lower extremities and painful thick toenails that are difficult to trim. Painful toenails interfere with ambulation. Aggravating factors include wearing enclosed shoe gear. Pain is relieved with periodic professional debridement. Painful calluses are aggravated when weightbearing with and without shoegear. Pain is relieved with periodic professional debridement.  Chief Complaint  Patient presents with   Toe Pain    Diabetic foot care. Dr. Yolande is his PCP. He saw him last Monday. He is unsure of his  A1c .   New problem(s): None.   PCP is Yolande Toribio MATSU, MD.  Allergies  Allergen Reactions   Codeine Nausea And Vomiting   Cymbalta [Duloxetine Hcl]     Other reaction(s): worsened depression   Duloxetine     Other reaction(s): Other (See Comments)   Glucophage [Metformin]     Other reaction(s): diarrhea   Lexapro [Escitalopram]     Other reaction(s): nausea   Metformin Hcl Diarrhea and Nausea Only   Ozempic (0.25 Or 0.5 Mg-Dose) [Semaglutide(0.25 Or 0.5mg -Dos)]     Other reaction(s): vomiting   Tylox [Oxycodone -Acetaminophen ] Nausea And Vomiting   Oxycodone      Other Reaction(s): GI Intolerance    Review of Systems: Negative except as noted in the HPI.  Objective:  There were no vitals filed for this visit. Grant [name] is a pleasant 53 y.o. male in NAD. AAO x 3.  Intact posterior tibialis and dorsalis pedis pulses Vascular Examination: Capillary refill time immediate b/l. Palpable pedal pulses. Pedal hair present b/l. No pain with calf compression b/l. Skin temperature gradient WNL b/l. No cyanosis or clubbing b/l. No ischemia or gangrene noted b/l.   Neurological Examination: Protective sensation  diminished with 10g monofilament b/l.  Dermatological Examination: Pedal skin with normal turgor, texture and tone b/l.  No open wounds. No interdigital macerations.   Incurvated nailplate left great toe medial border(s) with tenderness to palpation. No erythema, no edema, no drainage noted.   Toenails 1-4 bilaterally and L 5th toe elongated, discolored, dystrophic, thickened, and crumbly with subungual debris and tenderness to dorsal palpation. Hyperkeratotic lesion(s) submet head 5 left foot, sub 5th met base left foot, and sub 5th met base right foot.  No erythema, no edema, no drainage, no fluctuance.  Musculoskeletal Examination: Normal muscle strength 5/5 to all lower extremity muscle groups bilaterally. Lower extremity amputation(s): 5th ray amputation right foot. Plantarflexed metatarsal(s) 5th metatarsal head left lower extremity and 5th met base b/l lower extremities.. No pain, crepitus or joint limitation noted with ROM b/l LE.  Patient ambulates independently without assistive aids.  Radiographs: None  Assessment/Plan: 1. Pain due to onychomycosis of toenails of both feet   2. Callus   3. History of partial ray amputation of fifth toe of right foot   4. Type 2 diabetes mellitus with peripheral neuropathy [facility])   Patient was evaluated and treated. All patient's and/or POA's questions/concerns addressed on today's visit. Mycotic toenails 1-4 b/l  and left 5th toe debrided in length and girth without incident. Offending nail border left great toe debrided and curretaged. Border cleansed with alcohol . TAO applied. Patient instructed to apply Neosporin once daily for 7 days. Call office if he has any problems. Callus(es) submet head 5 left foot and sub 5th  met base b/l lower extremities pared with sharp debridement without incident. Continue daily foot inspections and monitor blood glucose per PCP/Endocrinologist's recommendations. Continue soft, supportive shoe gear daily. Report any pedal  injuries to medical professional. Call office if there are any questions/concerns.  Return in about 9 weeks (around 11/16/2024).  Grant [name], DPM       LOCATION: [address], [place] [address]                   Office [phone]   [facility] LOCATION: [address] [place], [place] [address] Office [phone]

## 2024-09-28 ENCOUNTER — Encounter: Payer: Self-pay | Admitting: Radiology

## 2024-10-01 DIAGNOSIS — R011 Cardiac murmur, unspecified: Secondary | ICD-10-CM | POA: Diagnosis not present

## 2024-10-01 DIAGNOSIS — E1140 Type 2 diabetes mellitus with diabetic neuropathy, unspecified: Secondary | ICD-10-CM | POA: Diagnosis not present

## 2024-10-01 DIAGNOSIS — I10 Essential (primary) hypertension: Secondary | ICD-10-CM | POA: Diagnosis not present

## 2024-10-01 DIAGNOSIS — Z9641 Presence of insulin pump (external) (internal): Secondary | ICD-10-CM | POA: Diagnosis not present

## 2024-10-01 DIAGNOSIS — E11621 Type 2 diabetes mellitus with foot ulcer: Secondary | ICD-10-CM | POA: Diagnosis not present

## 2024-10-01 DIAGNOSIS — E7800 Pure hypercholesterolemia, unspecified: Secondary | ICD-10-CM | POA: Diagnosis not present

## 2024-10-01 DIAGNOSIS — I739 Peripheral vascular disease, unspecified: Secondary | ICD-10-CM | POA: Diagnosis not present

## 2024-10-01 DIAGNOSIS — E1165 Type 2 diabetes mellitus with hyperglycemia: Secondary | ICD-10-CM | POA: Diagnosis not present

## 2024-10-14 DIAGNOSIS — E1165 Type 2 diabetes mellitus with hyperglycemia: Secondary | ICD-10-CM | POA: Diagnosis not present

## 2024-10-14 DIAGNOSIS — R011 Cardiac murmur, unspecified: Secondary | ICD-10-CM | POA: Diagnosis not present

## 2024-11-16 ENCOUNTER — Ambulatory Visit: Admitting: Podiatry

## 2024-12-17 ENCOUNTER — Encounter: Payer: Self-pay | Admitting: Podiatry

## 2024-12-17 ENCOUNTER — Ambulatory Visit: Admitting: Podiatry

## 2024-12-17 DIAGNOSIS — E1142 Type 2 diabetes mellitus with diabetic polyneuropathy: Secondary | ICD-10-CM | POA: Diagnosis not present

## 2024-12-17 DIAGNOSIS — B351 Tinea unguium: Secondary | ICD-10-CM | POA: Diagnosis not present

## 2024-12-17 DIAGNOSIS — M79675 Pain in left toe(s): Secondary | ICD-10-CM | POA: Diagnosis not present

## 2024-12-17 DIAGNOSIS — M79674 Pain in right toe(s): Secondary | ICD-10-CM | POA: Diagnosis not present

## 2024-12-17 DIAGNOSIS — Z89421 Acquired absence of other right toe(s): Secondary | ICD-10-CM

## 2024-12-17 DIAGNOSIS — L84 Corns and callosities: Secondary | ICD-10-CM | POA: Diagnosis not present

## 2024-12-21 NOTE — Progress Notes (Signed)
 "  Subjective:  Patient ID: Grant [name], male    DOB: 01/15/1971,  MRN: [id number]  Grant [name] presents to clinic today for at risk foot care. Patient has h/o NIDDM, neuropathy with amputation of partial amputation of partial 5th ray and callus(es) b/l feet and painful mycotic toenails that are difficult to trim. Painful toenails interfere with ambulation. Aggravating factors include wearing enclosed shoe gear. Pain is relieved with periodic professional debridement. Painful calluses are aggravated when weightbearing with and without shoegear. Pain is relieved with periodic professional debridement.  Chief Complaint  Patient presents with   Nail Problem    Thick painful toenails, 3 month follow up    Diabetes    A1C  9.4   New problem(s): None.   PCP is Yolande Toribio MATSU, MD. Grant [name] 06/08/24.  Allergies[1]  Review of Systems: Negative except as noted in the HPI.  Objective:  There were no vitals filed for this visit. Grant [name] is a pleasant 54 y.o. male in NAD. AAO x 3.  Vascular Examination: Capillary refill time immediate b/l. Palpable pedal pulses. Pedal hair present b/l. No pain with calf compression b/l. Skin temperature gradient WNL b/l. No cyanosis or clubbing b/l. No ischemia or gangrene noted b/l.   Neurological Examination: Protective sensation diminished with 10g monofilament b/l.  Dermatological Examination: Pedal skin noted to be mildly dry and cracked. No open wounds. No interdigital macerations.   Incurvated nailplate left great toe medial border(s) with tenderness to palpation. No erythema, no edema, no drainage noted.   Toenails 1-4 bilaterally and L 5th toe elongated, discolored, dystrophic, thickened, and crumbly with subungual debris and tenderness to dorsal palpation. Hyperkeratotic lesion(s)  sub 5th met base left foot, and sub 5th met base right foot.  No erythema, no edema, no drainage, no fluctuance.  Musculoskeletal Examination: Normal muscle  strength 5/5 to all lower extremity muscle groups bilaterally. Lower extremity amputation(s): 5th ray amputation right foot. Plantarflexed metatarsal(s) 5th metatarsal head left lower extremity and 5th met base b/l lower extremities.. No pain, crepitus or joint limitation noted with ROM b/l LE.  Patient ambulates independently without assistive aids.  Radiographs: None  Assessment/Plan: 1. Pain due to onychomycosis of toenails of both feet   2. Callus   3. History of partial ray amputation of fifth toe of right foot   4. Type 2 diabetes mellitus with peripheral neuropathy [facility])   Consent given for treatment. Patient examined.All patient's and/or POA's questions/concerns addressed on today's visit. Toenails 1-5 b/l debrided in length and girth without incident. Callus(es) sub 5th met base b/l feet gently filed without incident. Continue foot and shoe inspections daily. Advised patient to apply Eucerin intensive moisturizing lotion to feet daily followed by Aquaphor Healing Ointment. Monitor blood glucose per PCP/Endocrinologist's recommendations.Continue soft, supportive shoe gear daily. Report any pedal injuries to medical professional. Call office if there are any questions/concerns. Return in about 9 weeks (around 02/18/2025).  Grant [name], DPM      [place] LOCATION: [address] [place] [address]                   Office [phone]   [place] LOCATION: [address] [place], [place]  [address] Office [phone]     [1]  Allergies Allergen Reactions   Codeine Nausea And Vomiting   Cymbalta [Duloxetine Hcl]     Other reaction(s): worsened depression   Duloxetine     Other reaction(s): Other (See Comments)   Glucophage [Metformin]     Other reaction(s): diarrhea   Lexapro [Escitalopram]     Other reaction(s): nausea   Metformin Hcl Diarrhea and Nausea Only   Ozempic (0.25 Or 0.5 Mg-Dose)  [Semaglutide(0.25 Or 0.5mg -Dos)]     Other reaction(s): vomiting   Sitagliptin     Other Reaction(s): Unknown   Tylox [Oxycodone -Acetaminophen ] Nausea And Vomiting   Oxycodone      Other Reaction(s): GI Intolerance   "

## 2025-02-18 ENCOUNTER — Ambulatory Visit: Admitting: Podiatry

## 2025-07-23 ENCOUNTER — Encounter (INDEPENDENT_AMBULATORY_CARE_PROVIDER_SITE_OTHER)

## 2025-07-23 ENCOUNTER — Ambulatory Visit (INDEPENDENT_AMBULATORY_CARE_PROVIDER_SITE_OTHER): Admitting: Nurse Practitioner
